# Patient Record
Sex: Female | Born: 1945 | Race: White | Hispanic: No | Smoking: Former smoker
Health system: Southern US, Community
[De-identification: ages and names within clinical notes are randomized; demographics above are authoritative.]

## PROBLEM LIST (undated history)

## (undated) DIAGNOSIS — T7840XA Allergy, unspecified, initial encounter: Secondary | ICD-10-CM

## (undated) DIAGNOSIS — IMO0001 Reserved for inherently not codable concepts without codable children: Secondary | ICD-10-CM

## (undated) DIAGNOSIS — G44009 Cluster headache syndrome, unspecified, not intractable: Secondary | ICD-10-CM

## (undated) DIAGNOSIS — K5792 Diverticulitis of intestine, part unspecified, without perforation or abscess without bleeding: Secondary | ICD-10-CM

## (undated) DIAGNOSIS — L738 Other specified follicular disorders: Secondary | ICD-10-CM

## (undated) DIAGNOSIS — G2581 Restless legs syndrome: Secondary | ICD-10-CM

## (undated) DIAGNOSIS — G4730 Sleep apnea, unspecified: Secondary | ICD-10-CM

## (undated) DIAGNOSIS — K5909 Other constipation: Secondary | ICD-10-CM

## (undated) DIAGNOSIS — F419 Anxiety disorder, unspecified: Secondary | ICD-10-CM

## (undated) DIAGNOSIS — M2550 Pain in unspecified joint: Secondary | ICD-10-CM

## (undated) DIAGNOSIS — G20 Parkinson's disease: Secondary | ICD-10-CM

## (undated) DIAGNOSIS — E7800 Pure hypercholesterolemia, unspecified: Secondary | ICD-10-CM

## (undated) DIAGNOSIS — G4700 Insomnia, unspecified: Secondary | ICD-10-CM

## (undated) DIAGNOSIS — L678 Other hair color and hair shaft abnormalities: Secondary | ICD-10-CM

## (undated) DIAGNOSIS — H269 Unspecified cataract: Secondary | ICD-10-CM

## (undated) DIAGNOSIS — F329 Major depressive disorder, single episode, unspecified: Secondary | ICD-10-CM

## (undated) DIAGNOSIS — M354 Diffuse (eosinophilic) fasciitis: Secondary | ICD-10-CM

## (undated) DIAGNOSIS — R32 Unspecified urinary incontinence: Secondary | ICD-10-CM

## (undated) DIAGNOSIS — D126 Benign neoplasm of colon, unspecified: Secondary | ICD-10-CM

## (undated) DIAGNOSIS — K5790 Diverticulosis of intestine, part unspecified, without perforation or abscess without bleeding: Secondary | ICD-10-CM

## (undated) DIAGNOSIS — I10 Essential (primary) hypertension: Secondary | ICD-10-CM

## (undated) DIAGNOSIS — M2420 Disorder of ligament, unspecified site: Secondary | ICD-10-CM

## (undated) DIAGNOSIS — M629 Disorder of muscle, unspecified: Secondary | ICD-10-CM

## (undated) DIAGNOSIS — K644 Residual hemorrhoidal skin tags: Secondary | ICD-10-CM

## (undated) DIAGNOSIS — M797 Fibromyalgia: Secondary | ICD-10-CM

## (undated) DIAGNOSIS — M1990 Unspecified osteoarthritis, unspecified site: Secondary | ICD-10-CM

## (undated) DIAGNOSIS — G20A1 Parkinson's disease without dyskinesia, without mention of fluctuations: Secondary | ICD-10-CM

## (undated) HISTORY — PX: CATARACT EXTRACTION, BILATERAL: SHX1313

## (undated) HISTORY — DX: Diffuse (eosinophilic) fasciitis: M35.4

## (undated) HISTORY — DX: Restless legs syndrome: G25.81

## (undated) HISTORY — PX: BREAST SURGERY: SHX581

## (undated) HISTORY — DX: Disorder of muscle, unspecified: M62.9

## (undated) HISTORY — DX: Allergy, unspecified, initial encounter: T78.40XA

## (undated) HISTORY — DX: Other specified follicular disorders: L73.8

## (undated) HISTORY — DX: Anxiety disorder, unspecified: F41.9

## (undated) HISTORY — PX: TONSILLECTOMY: SUR1361

## (undated) HISTORY — PX: WISDOM TOOTH EXTRACTION: SHX21

## (undated) HISTORY — DX: Benign neoplasm of colon, unspecified: D12.6

## (undated) HISTORY — DX: Parkinson's disease: G20

## (undated) HISTORY — DX: Unspecified osteoarthritis, unspecified site: M19.90

## (undated) HISTORY — PX: REDUCTION MAMMAPLASTY: SUR839

## (undated) HISTORY — DX: Unspecified urinary incontinence: R32

## (undated) HISTORY — DX: Pure hypercholesterolemia, unspecified: E78.00

## (undated) HISTORY — DX: Major depressive disorder, single episode, unspecified: F32.9

## (undated) HISTORY — DX: Diverticulosis of intestine, part unspecified, without perforation or abscess without bleeding: K57.90

## (undated) HISTORY — PX: ABDOMINAL HYSTERECTOMY: SHX81

## (undated) HISTORY — DX: Pain in unspecified joint: M25.50

## (undated) HISTORY — DX: Parkinson's disease without dyskinesia, without mention of fluctuations: G20.A1

## (undated) HISTORY — DX: Disorder of ligament, unspecified site: M24.20

## (undated) HISTORY — DX: Fibromyalgia: M79.7

## (undated) HISTORY — DX: Residual hemorrhoidal skin tags: K64.4

## (undated) HISTORY — DX: Reserved for inherently not codable concepts without codable children: IMO0001

## (undated) HISTORY — DX: Essential (primary) hypertension: I10

## (undated) HISTORY — DX: Diverticulitis of intestine, part unspecified, without perforation or abscess without bleeding: K57.92

## (undated) HISTORY — DX: Sleep apnea, unspecified: G47.30

## (undated) HISTORY — DX: Other hair color and hair shaft abnormalities: L67.8

## (undated) HISTORY — PX: REFRACTIVE SURGERY: SHX103

## (undated) HISTORY — DX: Cluster headache syndrome, unspecified, not intractable: G44.009

## (undated) HISTORY — DX: Insomnia, unspecified: G47.00

## (undated) HISTORY — DX: Other constipation: K59.09

## (undated) HISTORY — DX: Unspecified cataract: H26.9

---

## 1979-10-14 HISTORY — PX: BREAST SURGERY: SHX581

## 1988-10-13 DIAGNOSIS — D126 Benign neoplasm of colon, unspecified: Secondary | ICD-10-CM

## 1988-10-13 HISTORY — DX: Benign neoplasm of colon, unspecified: D12.6

## 1993-10-13 HISTORY — PX: BREAST REDUCTION SURGERY: SHX8

## 1997-10-13 HISTORY — PX: TOTAL ABDOMINAL HYSTERECTOMY: SHX209

## 1998-08-09 ENCOUNTER — Other Ambulatory Visit: Admission: RE | Admit: 1998-08-09 | Discharge: 1998-08-09 | Payer: Self-pay | Admitting: Internal Medicine

## 1998-08-27 ENCOUNTER — Inpatient Hospital Stay (HOSPITAL_COMMUNITY): Admission: RE | Admit: 1998-08-27 | Discharge: 1998-08-29 | Payer: Self-pay | Admitting: Obstetrics and Gynecology

## 1998-12-13 ENCOUNTER — Encounter: Admission: RE | Admit: 1998-12-13 | Discharge: 1999-03-13 | Payer: Self-pay | Admitting: *Deleted

## 1999-07-29 ENCOUNTER — Encounter: Admission: RE | Admit: 1999-07-29 | Discharge: 1999-10-27 | Payer: Self-pay | Admitting: *Deleted

## 1999-08-22 ENCOUNTER — Other Ambulatory Visit: Admission: RE | Admit: 1999-08-22 | Discharge: 1999-08-22 | Payer: Self-pay | Admitting: Obstetrics and Gynecology

## 2000-03-12 ENCOUNTER — Ambulatory Visit: Admission: RE | Admit: 2000-03-12 | Discharge: 2000-03-12 | Payer: Self-pay | Admitting: Otolaryngology

## 2000-04-28 ENCOUNTER — Encounter: Payer: Self-pay | Admitting: Obstetrics and Gynecology

## 2000-04-28 ENCOUNTER — Encounter: Admission: RE | Admit: 2000-04-28 | Discharge: 2000-04-28 | Payer: Self-pay | Admitting: Obstetrics and Gynecology

## 2000-05-08 ENCOUNTER — Ambulatory Visit (HOSPITAL_BASED_OUTPATIENT_CLINIC_OR_DEPARTMENT_OTHER): Admission: RE | Admit: 2000-05-08 | Discharge: 2000-05-08 | Payer: Self-pay | Admitting: Otolaryngology

## 2000-10-08 ENCOUNTER — Encounter: Payer: Self-pay | Admitting: Family Medicine

## 2000-10-08 ENCOUNTER — Encounter: Admission: RE | Admit: 2000-10-08 | Discharge: 2000-10-08 | Payer: Self-pay | Admitting: Family Medicine

## 2000-10-30 ENCOUNTER — Other Ambulatory Visit: Admission: RE | Admit: 2000-10-30 | Discharge: 2000-10-30 | Payer: Self-pay | Admitting: Obstetrics and Gynecology

## 2001-12-10 ENCOUNTER — Ambulatory Visit (HOSPITAL_COMMUNITY): Admission: RE | Admit: 2001-12-10 | Discharge: 2001-12-10 | Payer: Self-pay | Admitting: Gastroenterology

## 2001-12-23 ENCOUNTER — Other Ambulatory Visit: Admission: RE | Admit: 2001-12-23 | Discharge: 2001-12-23 | Payer: Self-pay | Admitting: Obstetrics and Gynecology

## 2001-12-29 ENCOUNTER — Encounter: Payer: Self-pay | Admitting: Family Medicine

## 2001-12-29 ENCOUNTER — Encounter: Admission: RE | Admit: 2001-12-29 | Discharge: 2001-12-29 | Payer: Self-pay | Admitting: Family Medicine

## 2002-05-13 ENCOUNTER — Ambulatory Visit: Admission: RE | Admit: 2002-05-13 | Discharge: 2002-05-13 | Payer: Self-pay | Admitting: Family Medicine

## 2003-01-03 ENCOUNTER — Encounter: Admission: RE | Admit: 2003-01-03 | Discharge: 2003-01-03 | Payer: Self-pay | Admitting: Family Medicine

## 2003-01-03 ENCOUNTER — Encounter: Payer: Self-pay | Admitting: Family Medicine

## 2003-02-21 ENCOUNTER — Other Ambulatory Visit: Admission: RE | Admit: 2003-02-21 | Discharge: 2003-02-21 | Payer: Self-pay | Admitting: Obstetrics and Gynecology

## 2004-01-08 ENCOUNTER — Encounter: Admission: RE | Admit: 2004-01-08 | Discharge: 2004-01-08 | Payer: Self-pay | Admitting: Obstetrics and Gynecology

## 2004-02-22 ENCOUNTER — Other Ambulatory Visit: Admission: RE | Admit: 2004-02-22 | Discharge: 2004-02-22 | Payer: Self-pay | Admitting: Obstetrics and Gynecology

## 2004-04-30 ENCOUNTER — Encounter: Admission: RE | Admit: 2004-04-30 | Discharge: 2004-05-28 | Payer: Self-pay | Admitting: Family Medicine

## 2004-10-13 HISTORY — PX: ABDOMINOPLASTY: SUR9

## 2005-02-13 ENCOUNTER — Encounter: Admission: RE | Admit: 2005-02-13 | Discharge: 2005-02-13 | Payer: Self-pay | Admitting: Obstetrics and Gynecology

## 2005-02-24 ENCOUNTER — Other Ambulatory Visit: Admission: RE | Admit: 2005-02-24 | Discharge: 2005-02-24 | Payer: Self-pay | Admitting: Addiction Medicine

## 2006-02-20 ENCOUNTER — Encounter: Admission: RE | Admit: 2006-02-20 | Discharge: 2006-02-20 | Payer: Self-pay | Admitting: Obstetrics and Gynecology

## 2006-03-02 ENCOUNTER — Other Ambulatory Visit: Admission: RE | Admit: 2006-03-02 | Discharge: 2006-03-02 | Payer: Self-pay | Admitting: Obstetrics and Gynecology

## 2007-03-30 ENCOUNTER — Encounter: Admission: RE | Admit: 2007-03-30 | Discharge: 2007-03-30 | Payer: Self-pay | Admitting: Obstetrics and Gynecology

## 2007-04-09 ENCOUNTER — Other Ambulatory Visit: Admission: RE | Admit: 2007-04-09 | Discharge: 2007-04-09 | Payer: Self-pay | Admitting: Obstetrics and Gynecology

## 2007-10-14 HISTORY — PX: MUSCLE BIOPSY: SHX716

## 2008-04-12 ENCOUNTER — Encounter: Admission: RE | Admit: 2008-04-12 | Discharge: 2008-04-12 | Payer: Self-pay | Admitting: Obstetrics and Gynecology

## 2008-04-13 ENCOUNTER — Encounter: Admission: RE | Admit: 2008-04-13 | Discharge: 2008-04-13 | Payer: Self-pay | Admitting: Rheumatology

## 2008-04-19 ENCOUNTER — Encounter (INDEPENDENT_AMBULATORY_CARE_PROVIDER_SITE_OTHER): Payer: Self-pay | Admitting: Surgery

## 2008-04-19 ENCOUNTER — Ambulatory Visit (HOSPITAL_COMMUNITY): Admission: RE | Admit: 2008-04-19 | Discharge: 2008-04-19 | Payer: Self-pay | Admitting: Surgery

## 2008-05-08 ENCOUNTER — Encounter: Admission: RE | Admit: 2008-05-08 | Discharge: 2008-05-08 | Payer: Self-pay | Admitting: Surgery

## 2008-08-16 ENCOUNTER — Ambulatory Visit: Payer: Self-pay | Admitting: Obstetrics and Gynecology

## 2008-08-16 ENCOUNTER — Other Ambulatory Visit: Admission: RE | Admit: 2008-08-16 | Discharge: 2008-08-16 | Payer: Self-pay | Admitting: Obstetrics and Gynecology

## 2008-08-16 ENCOUNTER — Encounter: Payer: Self-pay | Admitting: Obstetrics and Gynecology

## 2008-09-25 ENCOUNTER — Ambulatory Visit: Payer: Self-pay | Admitting: Obstetrics and Gynecology

## 2009-04-27 ENCOUNTER — Encounter: Admission: RE | Admit: 2009-04-27 | Discharge: 2009-04-27 | Payer: Self-pay | Admitting: Obstetrics and Gynecology

## 2009-08-29 ENCOUNTER — Encounter (INDEPENDENT_AMBULATORY_CARE_PROVIDER_SITE_OTHER): Payer: Self-pay | Admitting: *Deleted

## 2011-01-13 ENCOUNTER — Other Ambulatory Visit: Payer: Self-pay | Admitting: Obstetrics and Gynecology

## 2011-01-13 ENCOUNTER — Encounter (INDEPENDENT_AMBULATORY_CARE_PROVIDER_SITE_OTHER): Payer: Medicare PPO | Admitting: Obstetrics and Gynecology

## 2011-01-13 ENCOUNTER — Other Ambulatory Visit (HOSPITAL_COMMUNITY)
Admission: RE | Admit: 2011-01-13 | Discharge: 2011-01-13 | Disposition: A | Discharge status: Home / Self Care | Payer: Medicare PPO | Source: Ambulatory Visit | Attending: Obstetrics and Gynecology | Admitting: Obstetrics and Gynecology

## 2011-01-13 DIAGNOSIS — N951 Menopausal and female climacteric states: Secondary | ICD-10-CM

## 2011-01-13 DIAGNOSIS — N952 Postmenopausal atrophic vaginitis: Secondary | ICD-10-CM

## 2011-01-13 DIAGNOSIS — N949 Unspecified condition associated with female genital organs and menstrual cycle: Secondary | ICD-10-CM

## 2011-01-13 DIAGNOSIS — Z124 Encounter for screening for malignant neoplasm of cervix: Secondary | ICD-10-CM | POA: Insufficient documentation

## 2011-01-14 ENCOUNTER — Encounter (HOSPITAL_COMMUNITY): Payer: Self-pay

## 2011-01-14 ENCOUNTER — Ambulatory Visit (HOSPITAL_COMMUNITY)
Admission: RE | Admit: 2011-01-14 | Discharge: 2011-01-14 | Disposition: A | Discharge status: Home / Self Care | Payer: Medicare PPO | Source: Ambulatory Visit | Attending: Obstetrics and Gynecology | Admitting: Obstetrics and Gynecology

## 2011-01-14 DIAGNOSIS — M2420 Disorder of ligament, unspecified site: Secondary | ICD-10-CM | POA: Insufficient documentation

## 2011-01-14 DIAGNOSIS — K5730 Diverticulosis of large intestine without perforation or abscess without bleeding: Secondary | ICD-10-CM | POA: Insufficient documentation

## 2011-01-14 DIAGNOSIS — R109 Unspecified abdominal pain: Secondary | ICD-10-CM | POA: Insufficient documentation

## 2011-01-14 DIAGNOSIS — M629 Disorder of muscle, unspecified: Secondary | ICD-10-CM | POA: Insufficient documentation

## 2011-01-14 DIAGNOSIS — K5900 Constipation, unspecified: Secondary | ICD-10-CM | POA: Insufficient documentation

## 2011-01-14 MED ORDER — IOHEXOL 300 MG/ML  SOLN
100.0000 mL | Freq: Once | INTRAMUSCULAR | Status: AC | PRN
  Administered 2011-01-14: 100 mL via INTRAVENOUS

## 2011-02-11 ENCOUNTER — Encounter: Payer: Self-pay | Admitting: Internal Medicine

## 2011-02-13 ENCOUNTER — Encounter: Payer: Self-pay | Admitting: Internal Medicine

## 2011-02-14 ENCOUNTER — Encounter: Payer: Self-pay | Admitting: Internal Medicine

## 2011-02-17 ENCOUNTER — Ambulatory Visit (INDEPENDENT_AMBULATORY_CARE_PROVIDER_SITE_OTHER): Payer: Medicare PPO | Admitting: Internal Medicine

## 2011-02-17 ENCOUNTER — Encounter: Payer: Self-pay | Admitting: Internal Medicine

## 2011-02-17 VITALS — BP 120/72 | HR 60 | Ht 60.0 in | Wt 139.2 lb

## 2011-02-17 DIAGNOSIS — K5732 Diverticulitis of large intestine without perforation or abscess without bleeding: Secondary | ICD-10-CM

## 2011-02-17 DIAGNOSIS — R933 Abnormal findings on diagnostic imaging of other parts of digestive tract: Secondary | ICD-10-CM

## 2011-02-17 NOTE — Patient Instructions (Addendum)
You will be due for a recall colonoscopy in December 2012 CC:Dr Gottsegan, Dr Catha Gosselin

## 2011-02-17 NOTE — Progress Notes (Signed)
Shannon Sutton Jul 15, 1946 MRN [id number]      History of Present Illness:  This is a 65 year old white female with a recent episode of diverticulitis treated by Dr Oletha Blend with Flagyl 500 mg 3 times a day and Cipro 500 mg twice a day x 1 week. The diagnosis was based on the patient's symptoms and CT scan of the abdomen which showed thickening of the sigmoid colon and descending colon consistent with pericolic inflammation. There was a 2x2.4 cm collection in the sigmoid colon consistent with a small abcess. She responded to oral antibiotics and has been asymptomatic now for 3 weeks. There was no fever, diarrhea or bleeding. 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 She has had numerous colonoscopies starting in the 1990s because of her family history of colorectal neoplasia in her father. She has a personal history of adenomatous polyps in 64 and 1991. Her last colonoscopy in December 2007 did not show any evidence of polyps. She has  chronic constipation for which she takes Correctol.   Past Medical History  Diagnosis Date  . Diverticulosis   . Diverticulitis   . Adenomatous colon polyp 1990  . External hemorrhoids   . Myalgia and myositis, unspecified   . Other disorder of muscle, ligament, and fascia   . Pain in joint, multiple sites    Past Surgical History  Procedure Date  . Breast reduction surgery 1995  . Total abdominal hysterectomy 1999  . Abdominoplasty 2006    "tummy tuck"    reports that she has quit smoking. She has never used smokeless tobacco. She reports that she does not drink alcohol or use illicit drugs. family history includes Breast cancer in her paternal grandmother; Colon cancer in her father; Colon polyps in her mother; and Heart disease in her father and mother. Allergies  Allergen Reactions  . Morphine And Related Itching    agitation        Review of Systems:  The remainder of the 10  point ROS is  negative except as outlined in H&P   Physical Exam: General appearance  Well developed, in no distress. Eyes- non icteric. HEENT nontraumatic, normocephalic. Mouth no lesions, tongue papillated, no cheilosis. Neck supple without adenopathy, thyroid not enlarged, no carotid bruits, no JVD. Lungs Clear to auscultation bilaterally. Cor normal S1 normal S2, regular rhythm , no murmur,  quiet precordium. Abdomen soft relaxed abdomen with a horizontal scar from prior abdominoplasty. Normal active bowel sounds. No distention. No palpable mass. Rectal: Soft Hemoccult negative stool. External hemorrhoidal tags. No fissure. Extremities no pedal edema. Skin no lesions. Neurological alert and oriented x 3. Psychological normal mood and affect.  Assessment and Plan:   Problem #1 history of diverticulosis with first episode of diverticulitis documented on a CT scan of the abdomen. This resolved on a one-week course of broad-spectrum antibiotics. Her abdominal exam today is normal. She will be due for a repeat colonoscopy in December of this year. There is no evidence of occult GI blood loss. She needs to increase the intake of fiber and purchase fiber supplements to take 2 teaspoons daily.   Problem #2 history of anal fissure. This is currently not active. She has external hemorrhoidal tags for which she uses topical ointment. We have discussed again a high fiber diet. She will be due for a recall colonoscopy in December 2012.  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 Patient [place] in [place]. She has possible histoplasmosis.  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 She will call Dr. Fredirick Maudlin office to obtain PPD skin test. Her liver function tests have been done periodically for monitoring of methotrexate therapy. She has had normal liver function tests all along.  02/17/2011 Lina Sar

## 2011-02-20 ENCOUNTER — Other Ambulatory Visit: Payer: Medicare PPO | Admitting: Internal Medicine

## 2011-02-25 NOTE — Op Note (Signed)
NAMEMAISLEY, HAINSWORTH                ACCOUNT NO.:  [id number]   MEDICAL RECORD NO.:  [id number]          PATIENT TYPE:  AMB   LOCATION:  DAY                          FACILITY:  [facility]   PHYSICIAN:  Thomas A. Cornett, M.D.DATE OF BIRTH:  [birth year]   DATE OF PROCEDURE:  04/19/2008  DATE OF DISCHARGE:                               OPERATIVE REPORT   PREOPERATIVE DIAGNOSIS:  Muscle pain, possible eosinophilic fasciitis.   POSTOPERATIVE DIAGNOSIS:  Muscle pain, possible eosinophilic fasciitis.   PROCEDURE:  Open biopsy of left thigh skin, subcutaneous fat, fascia and  muscle involving the vastus lateralis muscle.   SURGEON:  Maisie Fus A. Cornett, MD.   ANESTHESIA:  LMA with 0.25% Sensorcaine local.   ESTIMATED BLOOD LOSS:  5 mL.   TISSUE:  Skin, subcu fat, fascia and muscle consisting of the vastus  lateralis group to pathology.   INDICATIONS FOR PROCEDURE:  The patient is a 65 year old female with  muscle pain.  She has been followed by Dr. Corliss Skains and being worked up  for eosinophilic fasciitis.  She is in need of a biopsy to verify this  diagnosis.   DESCRIPTION OF PROCEDURE:  The patient was brought to the operating room  and placed supine.  Left thigh was prepped and draped anteriorly.  An  area of the mid thigh was chosen and about 3-4-cm incision was made.  I  dissected straight down to the vastus lateralis muscle group.  I took  the skin, fat and fascia overlying this muscle group and some of the  muscle fibers from it.  Total area of excision was about 3 cm.  I then  closed the fascia back with 2-0 Vicryl.  Monocryl 4-0 was used to close  the skin.  Dermabond was applied.  All final counts of sponge, needle  and instruments were found be correct for this portion of the case.  The  patient was awoken, taken to recovery in satisfactory condition.      Thomas A. Cornett, M.D.  Electronically Signed     TAC/MEDQ  D:  04/19/2008  T:  04/19/2008  Job:  [id number]   cc:    Pollyann Savoy, M.D.  Fax: [phone]

## 2011-03-03 ENCOUNTER — Encounter (INDEPENDENT_AMBULATORY_CARE_PROVIDER_SITE_OTHER): Payer: Self-pay | Admitting: Surgery

## 2011-05-22 ENCOUNTER — Ambulatory Visit (INDEPENDENT_AMBULATORY_CARE_PROVIDER_SITE_OTHER): Payer: Self-pay | Admitting: Surgery

## 2011-11-11 ENCOUNTER — Encounter: Payer: Self-pay | Admitting: Internal Medicine

## 2011-12-01 ENCOUNTER — Encounter: Payer: Self-pay | Admitting: Internal Medicine

## 2011-12-19 ENCOUNTER — Ambulatory Visit (AMBULATORY_SURGERY_CENTER): Payer: Medicare Other | Admitting: *Deleted

## 2011-12-19 ENCOUNTER — Encounter: Payer: Self-pay | Admitting: Internal Medicine

## 2011-12-19 VITALS — Ht 61.0 in | Wt 139.8 lb

## 2011-12-19 DIAGNOSIS — Z1211 Encounter for screening for malignant neoplasm of colon: Secondary | ICD-10-CM

## 2011-12-19 MED ORDER — PEG-KCL-NACL-NASULF-NA ASC-C 100 G PO SOLR: ORAL | Status: DC

## 2011-12-29 ENCOUNTER — Telehealth: Payer: Self-pay | Admitting: Internal Medicine

## 2011-12-30 NOTE — Telephone Encounter (Signed)
Please follow the rules for late cancellation.

## 2011-12-31 ENCOUNTER — Encounter: Payer: Medicare PPO | Admitting: Internal Medicine

## 2012-01-14 ENCOUNTER — Ambulatory Visit (AMBULATORY_SURGERY_CENTER): Payer: Medicare Other | Admitting: Internal Medicine

## 2012-01-14 ENCOUNTER — Telehealth: Payer: Self-pay | Admitting: Internal Medicine

## 2012-01-14 ENCOUNTER — Encounter: Payer: Self-pay | Admitting: Internal Medicine

## 2012-01-14 VITALS — BP 134/73 | HR 64 | Temp 95.8°F | Resp 15 | Ht 61.0 in | Wt 139.0 lb

## 2012-01-14 DIAGNOSIS — Z1211 Encounter for screening for malignant neoplasm of colon: Secondary | ICD-10-CM

## 2012-01-14 DIAGNOSIS — D126 Benign neoplasm of colon, unspecified: Secondary | ICD-10-CM

## 2012-01-14 DIAGNOSIS — Z800 Family history of malignant neoplasm of digestive organs: Secondary | ICD-10-CM

## 2012-01-14 MED ORDER — SODIUM CHLORIDE 0.9 % IV SOLN: 500.0000 mL | INTRAVENOUS | Status: DC

## 2012-01-14 NOTE — Op Note (Signed)
 [facility] [address] [place], [place]  [address]  COLONOSCOPY PROCEDURE REPORT  PATIENT:  Shannon, Sutton  MR#:  [id number] BIRTHDATE:  1945/11/11, 65 yrs. old  GENDER:  female ENDOSCOPIST:  Hedwig Morton. Juanda Chance, MD REF. BY:  Catha Gosselin, M.D. PROCEDURE DATE:  01/14/2012 PROCEDURE:  Colonoscopy with biopsy ASA CLASS:  Class I INDICATIONS:  family history of colon cancer, history of pre-cancerous (adenomatous) colon polyps father with colon cancer  aden. polyp 1994, last colon 1999 MEDICATIONS:   MAC sedation, administered by CRNA, propofol (Diprivan) 150 mg  DESCRIPTION OF PROCEDURE:   After the risks and benefits and of the procedure were explained, informed consent was obtained. Digital rectal exam was performed and revealed no rectal masses. The LB CF-[id number] endoscope was introduced through the anus and advanced to the cecum, which was identified by both the appendix and ileocecal valve.  The quality of the prep was good, using MoviPrep.  The instrument was then slowly withdrawn as the colon was fully examined. <<PROCEDUREIMAGES>>  FINDINGS:  A sessile polyp was found. 4 mm sessile polyp in the cecum The polyp was removed using cold biopsy forceps (see image2).  Mild diverticulosis was found in the sigmoid colon (see image1).  This was otherwise a normal examination of the colon (see image3, image4, and image5).   Retroflexed views in the rectum revealed no abnormalities.    The scope was then withdrawn from the patient and the procedure completed.  COMPLICATIONS:  None ENDOSCOPIC IMPRESSION: 1) Sessile polyp 2) Mild diverticulosis in the sigmoid colon 3) Otherwise normal examination RECOMMENDATIONS: 1) Await pathology results 2) High fiber diet.  REPEAT EXAM:  In 5 year(s) for.  ______________________________ Hedwig Morton. Juanda Chance, MD  CC:  n. eSIGNED:   Hedwig Morton. Kingslee Dowse at 01/14/2012 10:22 AM  Filbert Schilder, [id number]

## 2012-01-14 NOTE — Progress Notes (Signed)
Patient did not experience any of the following events: a burn prior to discharge; a fall within the facility; wrong site/side/patient/procedure/implant event; or a hospital transfer or hospital admission upon discharge from the facility. (G8907) Patient did not have preoperative order for IV antibiotic SSI prophylaxis. (G8918)  

## 2012-01-14 NOTE — Patient Instructions (Signed)
YOU HAD AN ENDOSCOPIC PROCEDURE TODAY AT THE [facility]: Refer to the procedure report that was given to you for any specific questions about what was found during the examination.  If the procedure report does not answer your questions, please call your gastroenterologist to clarify.  If you requested that your care partner not be given the details of your procedure findings, then the procedure report has been included in a sealed envelope for you to review at your convenience later.  YOU SHOULD EXPECT: Some feelings of bloating in the abdomen. Passage of more gas than usual.  Walking can help get rid of the air that was put into your GI tract during the procedure and reduce the bloating. If you had a lower endoscopy (such as a colonoscopy or flexible sigmoidoscopy) you may notice spotting of blood in your stool or on the toilet paper. If you underwent a bowel prep for your procedure, then you may not have a normal bowel movement for a few days.  DIET: Your first meal following the procedure should be a light meal and then it is ok to progress to your normal diet.  A half-sandwich or bowl of soup is an example of a good first meal.  Heavy or fried foods are harder to digest and may make you feel nauseous or bloated.  Likewise meals heavy in dairy and vegetables can cause extra gas to form and this can also increase the bloating.  Drink plenty of fluids but you should avoid alcoholic beverages for 24 hours.  ACTIVITY: Your care partner should take you home directly after the procedure.  You should plan to take it easy, moving slowly for the rest of the day.  You can resume normal activity the day after the procedure however you should NOT DRIVE or use heavy machinery for 24 hours (because of the sedation medicines used during the test).    SYMPTOMS TO REPORT IMMEDIATELY: A gastroenterologist can be reached at any hour.  During normal business hours, 8:30 AM to 5:00 PM Monday through Friday,  call [phone].  After hours and on weekends, please call the GI answering service at [phone] who will take a message and have the physician on call contact you.   Following lower endoscopy (colonoscopy or flexible sigmoidoscopy):  Excessive amounts of blood in the stool  Significant tenderness or worsening of abdominal pains  Swelling of the abdomen that is new, acute  Fever of 100F or higher    FOLLOW UP: If any biopsies were taken you will be contacted by phone or by letter within the next 1-3 weeks.  Call your gastroenterologist if you have not heard about the biopsies in 3 weeks.  Our staff will call the home number listed on your records the next business day following your procedure to check on you and address any questions or concerns that you may have at that time regarding the information given to you following your procedure. This is a courtesy call and so if there is no answer at the home number and we have not heard from you through the emergency physician on call, we will assume that you have returned to your regular daily activities without incident.  SIGNATURES/CONFIDENTIALITY: You and/or your care partner have signed paperwork which will be entered into your electronic medical record.  These signatures attest to the fact that that the information above on your After Visit Summary has been reviewed and is understood.  Full responsibility of the confidentiality   of this discharge information lies with you and/or your care-partner.     

## 2012-01-14 NOTE — Telephone Encounter (Signed)
Please cancel late cancellation fee. I have spoken to the daughter

## 2012-01-14 NOTE — Progress Notes (Signed)
The pt tolerated the colonoscopy very well. Maw   

## 2012-01-15 ENCOUNTER — Telehealth: Payer: Self-pay | Admitting: *Deleted

## 2012-01-15 NOTE — Telephone Encounter (Signed)
Left message on number given in admitting as directed. ewm

## 2012-01-15 NOTE — Telephone Encounter (Signed)
Please see Dr Regino Schultze notes

## 2012-01-19 ENCOUNTER — Encounter: Payer: Self-pay | Admitting: Internal Medicine

## 2012-05-07 LAB — HM DEXA SCAN

## 2012-11-12 ENCOUNTER — Encounter: Payer: Self-pay | Admitting: Physical Medicine & Rehabilitation

## 2012-11-16 ENCOUNTER — Encounter: Payer: Medicare Other | Attending: Physical Medicine & Rehabilitation | Admitting: Physical Medicine & Rehabilitation

## 2012-11-16 ENCOUNTER — Encounter: Payer: Self-pay | Admitting: Physical Medicine & Rehabilitation

## 2012-11-16 VITALS — BP 105/56 | HR 79 | Resp 14 | Ht 61.0 in | Wt 135.0 lb

## 2012-11-16 DIAGNOSIS — G4700 Insomnia, unspecified: Secondary | ICD-10-CM

## 2012-11-16 DIAGNOSIS — F3289 Other specified depressive episodes: Secondary | ICD-10-CM

## 2012-11-16 DIAGNOSIS — M629 Disorder of muscle, unspecified: Secondary | ICD-10-CM

## 2012-11-16 DIAGNOSIS — M797 Fibromyalgia: Secondary | ICD-10-CM

## 2012-11-16 DIAGNOSIS — M354 Diffuse (eosinophilic) fasciitis: Secondary | ICD-10-CM

## 2012-11-16 DIAGNOSIS — M2420 Disorder of ligament, unspecified site: Secondary | ICD-10-CM | POA: Insufficient documentation

## 2012-11-16 DIAGNOSIS — Z5181 Encounter for therapeutic drug level monitoring: Secondary | ICD-10-CM

## 2012-11-16 DIAGNOSIS — IMO0001 Reserved for inherently not codable concepts without codable children: Secondary | ICD-10-CM

## 2012-11-16 DIAGNOSIS — F329 Major depressive disorder, single episode, unspecified: Secondary | ICD-10-CM

## 2012-11-16 DIAGNOSIS — F32A Depression, unspecified: Secondary | ICD-10-CM

## 2012-11-16 DIAGNOSIS — D7218 Eosinophilia in diseases classified elsewhere: Secondary | ICD-10-CM

## 2012-11-16 DIAGNOSIS — G2581 Restless legs syndrome: Secondary | ICD-10-CM | POA: Insufficient documentation

## 2012-11-16 DIAGNOSIS — R52 Pain, unspecified: Secondary | ICD-10-CM

## 2012-11-16 HISTORY — DX: Depression, unspecified: F32.A

## 2012-11-16 HISTORY — DX: Diffuse (eosinophilic) fasciitis: M35.4

## 2012-11-16 HISTORY — DX: Fibromyalgia: M79.7

## 2012-11-16 HISTORY — DX: Insomnia, unspecified: G47.00

## 2012-11-16 HISTORY — DX: Eosinophilia in diseases classified elsewhere: D72.18

## 2012-11-16 NOTE — Patient Instructions (Signed)
SUPPLEMENTS TO TRY:  DHEA: 25MG  DAILY FOR PAIN AND ENERGY MELATONIN 3-10MG  AT BEDTIME FOR SLEEP

## 2012-11-16 NOTE — Progress Notes (Signed)
Subjective:    Patient ID: Shannon Sutton, female    DOB: [birth year], 67 y.o.   MRN: [id number]  HPI This is a pleasant 67 yo female who tells me she was dx'ed with FMS about 10-15 years ago. She had problems with generalized pain, with particularly around the left shoulder. She received TPI's by Dr. Link Snuffer around 2008 which were quite helpful.  Just after that time she was dx'ed with eosinophilic fasciitis which has been treated and is now basically in remission. This was treated ad [facility] and eventually Dr. Nickola Major has followed it locally.   From a pain standpoint, her left shoulder pain is constant. She tries stretching, yoga, rolling around on a tennis ball, but none of what she does seems to help. She had a recent deep tissue massage which helped for a short period of time. She reports a remote course of physical therapy at [facility] and had variable results with myofascial release, biofeedback, etc.  She also reports RLS, insomnia, depression, mild constipation. She uses the klonopin for her RLS.  She takes klonopin, trazodone, xanax at night to help sleep and RLS symptoms. She also takes co-enzyme q10, multivitamin, omega 3's, vitamin d. She states that her vitamin d level was checked recently.    Pain Inventory Average Pain 9 Pain Right Now 4 My pain is burning and aching  In the last 24 hours, has pain interfered with the following? General activity 8 Relation with others 8 Enjoyment of life 8 What TIME of day is your pain at its worst? morning Sleep (in general) Poor  Pain is worse with: inactivity Pain improves with: therapy/exercise and medication Relief from Meds: 4  Mobility walk without assistance do you drive?  yes Do you have any goals in this area?  yes  Function retired Do you have any goals in this area?  yes  Neuro/Psych bowel control problems tingling spasms dizziness loss of taste or smell  Prior Studies x-rays  Physicians  involved in your care Primary care Dr Catha Gosselin Neurologist Dr Terrace [place] Rheumatologist Dr Hocks   Family History  Problem Relation Age of Onset  . Colon cancer Father 55  . Heart disease Father   . Cancer Father     COLON  . Colon polyps Mother   . Heart disease Mother   . Breast cancer Paternal Grandmother   . COPD Sister   . Stomach cancer Neg Hx    History   Social History  . Marital Status: Divorced    Spouse Name: N/A    Number of Children: 2  . Years of Education: N/A   Occupational History  . Retired    Social History Main Topics  . Smoking status: Former Smoker    Quit date: 12/19/1986  . Smokeless tobacco: Never Used  . Alcohol Use: Yes     Comment: ONCE OR TWICE A YEAR WILL HAVE WINE  . Drug Use: No  . Sexually Active: None   Other Topics Concern  . None   Social History Narrative  . None   Past Surgical History  Procedure Date  . Breast reduction surgery 1995  . Total abdominal hysterectomy 1999  . Abdominoplasty 2006    "tummy tuck"  . Breast surgery 1981    left breast biopsy  . Muscle biopsy [id number]    DEEP TISSUE  . Tonsillectomy   . Wisdom tooth extraction    Past Medical History  Diagnosis Date  . Diverticulosis   . Diverticulitis   .  Adenomatous colon polyp 1990  . External hemorrhoids   . Myalgia and myositis, unspecified   . Other disorder of muscle, ligament, and fascia   . Pain in joint, multiple sites    BP 105/56  Pulse 79  Resp 14  Ht 5\' 1"  (1.549 m)  Wt 135 lb (61.236 kg)  BMI 25.51 kg/m2  SpO2 99%     Review of Systems  Constitutional: Positive for appetite change.  HENT: Positive for neck pain.   Gastrointestinal: Positive for constipation.  Musculoskeletal: Positive for myalgias and arthralgias.  Neurological: Positive for numbness.  All other systems reviewed and are negative.       Objective:   Physical Exam  General: Alert and oriented x 3, No apparent distress HEENT: Head is normocephalic,  atraumatic, PERRLA, EOMI, sclera anicteric, oral mucosa pink and moist, dentition intact, ext ear canals clear,  Neck: Supple without JVD or lymphadenopathy Heart: Reg rate and rhythm. No murmurs rubs or gallops Chest: CTA bilaterally without wheezes, rales, or rhonchi; no distress Abdomen: Soft, non-tender, non-distended, bowel sounds positive. Extremities: No clubbing, cyanosis, or edema. Pulses are 2+ Skin: Clean and intact without signs of breakdown. Skin is quite supple.  Neuro: Pt is cognitively appropriate with normal insight, memory, and awareness. Cranial nerves 2-12 are intact. Sensory exam is normal. Reflexes are 2+ in all 4's. Fine motor coordination is intact. No tremors. Motor function is grossly 5/5.  Musculoskeletal:visible TPI's in right trap, also with TPI in right SCM. A forward head position noted with shoulders internally.  No pain with RTC maneuvers. Pain with right rotation of neck to the right especially with right side bending. She has generalized mild tenderness throughout her limbs and trunk Psych: Pt's affect is appropriate. Pt is cooperative        Assessment & Plan:  1. Fibromyalgia syndrome--with myofascial pain, RLS, insomnia, depression 2. Eosinophilic Fasciitis- in remission 3. Insomnia   Plan:  1. After informed consent and preparation of the skin with isopropyl alcohol, I injected the right trap (2) and right SCM (1) each with 2cc of 1% lidocaine. The patient tolerated well initially. She did develop tingling and numbness in the left arm about 5 minutes after the injections. She had some subjective weakness but functional use of the left arm. I suspect that she had some spread of the anesthetic to her plexus. No signs of hematoma or bruising was seen at the [facility] site, no swelling. I encouraged her that these symptoms would subside once the lidocaine wore off. She was to call if she experienced persistent sensory loss or weakness. She was experiencing  substantial relief in the left trap after the injections. 2. Continue with supplements as she's been taking. Encouraged the use of DHEA, 25mg  qday as well as melatonin 3-10 mg qhs to help with energy and sleep respectively 3. Will make a referral to [facility] for cervical and shoulder girdle posture, ROM, modalities, HEP. 4. Follow up with me in about 6 weeks. 45 minutes of face to face patient care time were spent during this visit. All questions were encouraged and answered.  I would like to thank Dr. Clarene Duke for the referral of this pleasant lady.

## 2012-11-27 ENCOUNTER — Other Ambulatory Visit: Payer: Self-pay

## 2012-12-28 ENCOUNTER — Ambulatory Visit: Payer: Medicare Other | Admitting: Physical Medicine & Rehabilitation

## 2013-05-15 LAB — HM MAMMOGRAPHY: HM Mammogram: NORMAL

## 2013-05-18 ENCOUNTER — Other Ambulatory Visit: Payer: Self-pay

## 2013-05-26 ENCOUNTER — Other Ambulatory Visit: Payer: Self-pay | Admitting: Family Medicine

## 2013-05-26 DIAGNOSIS — H579 Unspecified disorder of eye and adnexa: Secondary | ICD-10-CM

## 2013-06-01 ENCOUNTER — Ambulatory Visit
Admission: RE | Admit: 2013-06-01 | Discharge: 2013-06-01 | Disposition: A | Discharge status: Home / Self Care | Payer: Medicare Other | Source: Ambulatory Visit | Attending: Family Medicine | Admitting: Family Medicine

## 2013-06-01 DIAGNOSIS — H579 Unspecified disorder of eye and adnexa: Secondary | ICD-10-CM

## 2013-06-29 ENCOUNTER — Other Ambulatory Visit: Payer: Self-pay | Admitting: Family Medicine

## 2013-06-29 DIAGNOSIS — IMO0001 Reserved for inherently not codable concepts without codable children: Secondary | ICD-10-CM

## 2013-06-29 DIAGNOSIS — M549 Dorsalgia, unspecified: Secondary | ICD-10-CM

## 2013-07-11 ENCOUNTER — Ambulatory Visit
Admission: RE | Admit: 2013-07-11 | Discharge: 2013-07-11 | Disposition: A | Discharge status: Home / Self Care | Payer: Medicare Other | Source: Ambulatory Visit | Attending: Family Medicine | Admitting: Family Medicine

## 2013-07-11 DIAGNOSIS — M549 Dorsalgia, unspecified: Secondary | ICD-10-CM

## 2013-07-11 DIAGNOSIS — IMO0001 Reserved for inherently not codable concepts without codable children: Secondary | ICD-10-CM

## 2013-08-18 ENCOUNTER — Other Ambulatory Visit: Payer: Self-pay

## 2013-09-19 LAB — HEPATIC FUNCTION PANEL
ALK PHOS: 55 U/L (ref 25–125)
ALT: 15 U/L (ref 7–35)
AST: 18 U/L (ref 13–35)
Bilirubin, Total: 0.5 mg/dL

## 2013-09-19 LAB — BASIC METABOLIC PANEL
BUN: 11 mg/dL (ref 4–21)
Creatinine: 0.6 mg/dL (ref 0.5–1.1)
Glucose: 90 mg/dL
Potassium: 4.3 mmol/L (ref 3.4–5.3)
Sodium: 140 mmol/L (ref 137–147)

## 2013-09-19 LAB — LIPID PANEL
CHOLESTEROL: 263 mg/dL — AB (ref 0–200)
HDL: 69 mg/dL (ref 35–70)
LDL CALC: 169 mg/dL
TRIGLYCERIDES: 127 mg/dL (ref 40–160)

## 2013-09-19 LAB — TSH: TSH: 2.62 u[IU]/mL (ref 0.41–5.90)

## 2013-11-30 ENCOUNTER — Ambulatory Visit (INDEPENDENT_AMBULATORY_CARE_PROVIDER_SITE_OTHER): Payer: 59 | Admitting: Physician Assistant

## 2013-11-30 ENCOUNTER — Encounter: Payer: Self-pay | Admitting: Physician Assistant

## 2013-11-30 VITALS — BP 120/70 | HR 90 | Temp 98.0°F | Wt 131.4 lb

## 2013-11-30 DIAGNOSIS — R3989 Other symptoms and signs involving the genitourinary system: Secondary | ICD-10-CM

## 2013-11-30 DIAGNOSIS — N390 Urinary tract infection, site not specified: Secondary | ICD-10-CM

## 2013-11-30 DIAGNOSIS — R399 Unspecified symptoms and signs involving the genitourinary system: Secondary | ICD-10-CM

## 2013-11-30 LAB — POCT URINALYSIS DIPSTICK
Bilirubin, UA: NEGATIVE
Blood, UA: NEGATIVE
Glucose, UA: NEGATIVE
Ketones, UA: NEGATIVE
Nitrite, UA: NEGATIVE
Protein, UA: NEGATIVE
Spec Grav, UA: <=1.005
Urobilinogen, UA: 0.2
pH, UA: 6

## 2013-11-30 MED ORDER — PHENAZOPYRIDINE HCL 97.2 MG PO TABS: 97.0000 mg | ORAL_TABLET | Freq: Three times a day (TID) | ORAL | Status: DC | PRN

## 2013-11-30 MED ORDER — SULFAMETHOXAZOLE-TMP DS 800-160 MG PO TABS: 1.0000 | ORAL_TABLET | Freq: Two times a day (BID) | ORAL | Status: DC

## 2013-11-30 NOTE — Progress Notes (Signed)
Pre visit review using our clinic review tool, if applicable. No additional management support is needed unless otherwise documented below in the visit note. 

## 2013-11-30 NOTE — Progress Notes (Signed)
Subjective:    Patient ID: Shannon Sutton, female    DOB: 1946-03-22, 68 y.o.   MRN: [id number]  HPI Comments: Patient is a 68 year old female who presents to the office with complaint of lower abdominal pain and chills. Patient reports last evening she experienced pain in the lower abdomen when urinating. Reports she was experiencing chills both last night and this morning, bad enough to make her teeth "clatter". Fever this morning with Tmax of 100*,  Reports lower back pain starting yesterday as well. Has used nothing for pain or fever relief.  Denies change in bowels, hematuria, dysuria, or other complaints at this time.   Past Medical History  Diagnosis Date  . Diverticulosis   . Diverticulitis   . Adenomatous colon polyp 1990  . External hemorrhoids   . Myalgia and myositis, unspecified   . Other disorder of muscle, ligament, and fascia   . Pain in joint, multiple sites    Family History  Problem Relation Age of Onset  . Colon cancer Father 38  . Heart disease Father   . Cancer Father     COLON  . Colon polyps Mother   . Heart disease Mother   . Breast cancer Paternal Grandmother   . COPD Sister   . Stomach cancer Neg Hx    History   Social History  . Marital Status: Divorced    Spouse Name: N/A    Number of Children: 2  . Years of Education: N/A   Occupational History  . Retired    Social History Main Topics  . Smoking status: Former Smoker    Quit date: 12/19/1986  . Smokeless tobacco: Never Used  . Alcohol Use: Yes     Comment: ONCE OR TWICE A YEAR WILL HAVE WINE  . Drug Use: No  . Sexual Activity: None   Other Topics Concern  . None   Social History Narrative  . None   Past Surgical History  Procedure Laterality Date  . Breast reduction surgery  1995  . Total abdominal hysterectomy  1999  . Abdominoplasty  2006    "tummy tuck"  . Breast surgery  1981    left breast biopsy  . Muscle biopsy  [id number]    DEEP TISSUE  . Tonsillectomy    .  Wisdom tooth extraction       Review of Systems  Constitutional: Positive for chills. Negative for activity change and appetite change.  Gastrointestinal: Positive for nausea (last evening, resolved now). Negative for vomiting.  Genitourinary: Negative for dysuria, urgency, hematuria, flank pain, decreased urine volume and difficulty urinating.       Pain in lower abdomen at end of urination.  Musculoskeletal: Positive for back pain.  Skin: Negative for rash.  Neurological: Negative for dizziness and light-headedness.       Objective:   Physical Exam  Vitals reviewed. Constitutional: She is oriented to person, place, and time. She appears well-developed and well-nourished. No distress.  HENT:  Head: Normocephalic and atraumatic.  Right Ear: External ear normal.  Left Ear: External ear normal.  Eyes: Conjunctivae are normal.  Neck: Normal range of motion.  Cardiovascular: Normal rate, regular rhythm and normal heart sounds.  Exam reveals no gallop and no friction rub.   No murmur heard. Pulmonary/Chest: Effort normal and breath sounds normal. She has no wheezes. She has no rales.  Abdominal: Soft. Normal appearance and bowel sounds are normal. There is no hepatosplenomegaly. There is no tenderness. There is  no rebound and no CVA tenderness.  Musculoskeletal: Normal range of motion.  Neurological: She is alert and oriented to person, place, and time.  Skin: Skin is warm and dry.  Psychiatric: She has a normal mood and affect.   Filed Vitals:   11/30/13 1149  BP: 120/70  Pulse: 90  Temp: 98 F (36.7 C)   Results for orders placed in visit on 11/30/13  POCT URINALYSIS DIPSTICK      Result Value Ref Range   Color, UA light yellow     Clarity, UA clear     Glucose, UA neg     Bilirubin, UA neg     Ketones, UA neg     Spec Grav, UA <=1.005     Blood, UA neg     pH, UA 6.0     Protein, UA neg     Urobilinogen, UA 0.2     Nitrite, UA neg     Leukocytes, UA large (3+)           Assessment & Plan:  URI Patient afebrile, non tender abdomen, no CVA tenderness, POC urine with large leukocytes, will treat for UTI Rx for Bactrim 800-160 mg, one tab bid x 3 days and Pyridium. Counseled to drink plenty of water, RTO if no improvement of symptoms.

## 2013-11-30 NOTE — Patient Instructions (Signed)
It was great to meet you today Ms. Slabach!  I have sent prescriptions to your pharmacy, please take as prescribed.    Urinary Tract Infection A urinary tract infection (UTI) can occur any place along the urinary tract. The tract includes the kidneys, ureters, bladder, and urethra. A type of germ called bacteria often causes a UTI. UTIs are often helped with antibiotic medicine.  HOME CARE   If given, take antibiotics as told by your doctor. Finish them even if you start to feel better.  Drink enough fluids to keep your pee (urine) clear or pale yellow.  Avoid tea, drinks with caffeine, and bubbly (carbonated) drinks.  Pee often. Avoid holding your pee in for a long time.  Pee before and after having sex (intercourse).  Wipe from front to back after you poop (bowel movement) if you are a woman. Use each tissue only once. GET HELP RIGHT AWAY IF:   You have back pain.  You have lower belly (abdominal) pain.  You have chills.  You feel sick to your stomach (nauseous).  You throw up (vomit).  Your burning or discomfort with peeing does not go away.  You have a fever.  Your symptoms are not better in 3 days. MAKE SURE YOU:   Understand these instructions.  Will watch your condition.  Will get help right away if you are not doing well or get worse. Document Released: 03/17/2008 Document Revised: 06/23/2012 Document Reviewed: 04/29/2012 [facility] Patient Information 2014 [place], [place].

## 2013-12-01 ENCOUNTER — Other Ambulatory Visit: Payer: 59

## 2013-12-01 DIAGNOSIS — R399 Unspecified symptoms and signs involving the genitourinary system: Secondary | ICD-10-CM

## 2013-12-03 LAB — URINE CULTURE: Colony Count: >=100000

## 2013-12-14 ENCOUNTER — Encounter: Payer: Self-pay | Admitting: Physician Assistant

## 2013-12-14 ENCOUNTER — Other Ambulatory Visit (INDEPENDENT_AMBULATORY_CARE_PROVIDER_SITE_OTHER): Payer: 59

## 2013-12-14 ENCOUNTER — Ambulatory Visit (INDEPENDENT_AMBULATORY_CARE_PROVIDER_SITE_OTHER): Payer: 59 | Admitting: Physician Assistant

## 2013-12-14 VITALS — BP 110/72 | HR 71 | Temp 98.2°F | Ht 61.0 in | Wt 134.8 lb

## 2013-12-14 DIAGNOSIS — G4700 Insomnia, unspecified: Secondary | ICD-10-CM

## 2013-12-14 DIAGNOSIS — Z780 Asymptomatic menopausal state: Secondary | ICD-10-CM

## 2013-12-14 DIAGNOSIS — F329 Major depressive disorder, single episode, unspecified: Secondary | ICD-10-CM

## 2013-12-14 DIAGNOSIS — F3289 Other specified depressive episodes: Secondary | ICD-10-CM

## 2013-12-14 DIAGNOSIS — F32A Depression, unspecified: Secondary | ICD-10-CM

## 2013-12-14 DIAGNOSIS — R3989 Other symptoms and signs involving the genitourinary system: Secondary | ICD-10-CM

## 2013-12-14 DIAGNOSIS — Z0000 Encounter for general adult medical examination without abnormal findings: Secondary | ICD-10-CM

## 2013-12-14 DIAGNOSIS — R399 Unspecified symptoms and signs involving the genitourinary system: Secondary | ICD-10-CM

## 2013-12-14 DIAGNOSIS — Z23 Encounter for immunization: Secondary | ICD-10-CM

## 2013-12-14 DIAGNOSIS — E7800 Pure hypercholesterolemia, unspecified: Secondary | ICD-10-CM

## 2013-12-14 DIAGNOSIS — G2581 Restless legs syndrome: Secondary | ICD-10-CM

## 2013-12-14 DIAGNOSIS — N390 Urinary tract infection, site not specified: Secondary | ICD-10-CM

## 2013-12-14 DIAGNOSIS — F419 Anxiety disorder, unspecified: Secondary | ICD-10-CM

## 2013-12-14 DIAGNOSIS — F411 Generalized anxiety disorder: Secondary | ICD-10-CM

## 2013-12-14 LAB — HEPATIC FUNCTION PANEL
ALBUMIN: 3.6 g/dL (ref 3.5–5.2)
ALK PHOS: 50 U/L (ref 39–117)
ALT: 16 U/L (ref 0–35)
AST: 24 U/L (ref 0–37)
BILIRUBIN TOTAL: 0.9 mg/dL (ref 0.3–1.2)
Bilirubin, Direct: 0.1 mg/dL (ref 0.0–0.3)
Total Protein: 6.6 g/dL (ref 6.0–8.3)

## 2013-12-14 LAB — TSH: TSH: 1.36 u[IU]/mL (ref 0.35–5.50)

## 2013-12-14 LAB — URINALYSIS, ROUTINE W REFLEX MICROSCOPIC
BILIRUBIN URINE: NEGATIVE
Hgb urine dipstick: NEGATIVE
Ketones, ur: NEGATIVE
NITRITE: NEGATIVE
PH: 6 (ref 5.0–8.0)
Specific Gravity, Urine: <=1.005 — AB (ref 1.000–1.030)
TOTAL PROTEIN, URINE-UPE24: NEGATIVE
URINE GLUCOSE: NEGATIVE
Urobilinogen, UA: 0.2 (ref 0.0–1.0)

## 2013-12-14 LAB — POCT URINALYSIS DIPSTICK
BILIRUBIN UA: NEGATIVE
Blood, UA: NEGATIVE
Glucose, UA: NEGATIVE
KETONES UA: NEGATIVE
Nitrite, UA: NEGATIVE
Protein, UA: NEGATIVE
Urobilinogen, UA: 0.2
pH, UA: 5

## 2013-12-14 LAB — CBC WITH DIFFERENTIAL/PLATELET
BASOS PCT: 0.8 % (ref 0.0–3.0)
Basophils Absolute: 0.1 10*3/uL (ref 0.0–0.1)
EOS PCT: 2.9 % (ref 0.0–5.0)
Eosinophils Absolute: 0.2 10*3/uL (ref 0.0–0.7)
HEMATOCRIT: 38 % (ref 36.0–46.0)
HEMOGLOBIN: 12.8 g/dL (ref 12.0–15.0)
LYMPHS ABS: 2.3 10*3/uL (ref 0.7–4.0)
Lymphocytes Relative: 29.3 % (ref 12.0–46.0)
MCHC: 33.6 g/dL (ref 30.0–36.0)
MCV: 91.7 fl (ref 78.0–100.0)
MONOS PCT: 7.3 % (ref 3.0–12.0)
Monocytes Absolute: 0.6 10*3/uL (ref 0.1–1.0)
NEUTROS ABS: 4.7 10*3/uL (ref 1.4–7.7)
Neutrophils Relative %: 59.7 % (ref 43.0–77.0)
PLATELETS: 258 10*3/uL (ref 150.0–400.0)
RBC: 4.15 Mil/uL (ref 3.87–5.11)
RDW: 13.2 % (ref 11.5–14.6)
WBC: 7.9 10*3/uL (ref 4.5–10.5)

## 2013-12-14 LAB — LIPID PANEL
CHOLESTEROL: 187 mg/dL (ref 0–200)
HDL: 67.7 mg/dL (ref >39.00)
LDL Cholesterol: 109 mg/dL — ABNORMAL HIGH (ref 0–99)
Total CHOL/HDL Ratio: 3
Triglycerides: 53 mg/dL (ref 0.0–149.0)
VLDL: 10.6 mg/dL (ref 0.0–40.0)

## 2013-12-14 LAB — BASIC METABOLIC PANEL
BUN: 13 mg/dL (ref 6–23)
CALCIUM: 8.9 mg/dL (ref 8.4–10.5)
CO2: 28 meq/L (ref 19–32)
CREATININE: 0.6 mg/dL (ref 0.4–1.2)
Chloride: 105 mEq/L (ref 96–112)
GFR: 107.13 mL/min (ref >60.00)
GLUCOSE: 70 mg/dL (ref 70–99)
Potassium: 3.8 mEq/L (ref 3.5–5.1)
Sodium: 138 mEq/L (ref 135–145)

## 2013-12-14 MED ORDER — NITROFURANTOIN MONOHYD MACRO 100 MG PO CAPS: 100.0000 mg | ORAL_CAPSULE | Freq: Two times a day (BID) | ORAL | Status: DC

## 2013-12-14 NOTE — Progress Notes (Signed)
Patient ID: Shannon Sutton is a 68 y.o. female DOB: [id number] MRN: [id number]     HPI:  Patient is a 68 year old female who presents to the office to establish care. Patient has had regular scheduled care in past however, insurance has changed and no longer accepted at previous PCP office. States generally in good health. Since retiring, patient has lost 50-60 pounds with exercise and a planned meal diet system. Has had regular GYN visits however provider retired would like referral for new provider. Follow with Dr. Dickie La, GI, for history of colon polyps, to be seen in 2018. Seen here two weeks ago for acute UTI, treated with antibiotic which she finished, complains of continued frequency and pain with urination. 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   Influenza:  Pneumonia: 12/14 Tetanus: more than 10 years ago PAP: hysterectomy LMP: post menopausal Mammogram: 8/14 Eye Dr. Appointment this month for surgical procedure Dentist: sees regularly Colonoscopy: 2013 Dexa scan: unknown  ROS: As stated in HPI. All other systems negative  Past Medical History  Diagnosis Date  . Diverticulosis   . Diverticulitis   . Adenomatous colon polyp 1990  . External hemorrhoids   . Myalgia and myositis, unspecified   . Other disorder of muscle, ligament, and fascia   . Pain in joint, multiple sites   . Depression 11/16/2012  . 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 Fibromyalgia with myofascial pain 11/16/2012  . Insomnia 11/16/2012  .  Arthritis   . Headaches, cluster   . Allergy   . Hyperlipidemia   . Urine incontinence    Family History  Problem Relation Age of Onset  . Colon cancer Father 31  . Heart disease Father   . Cancer Father     COLON  . Colon polyps Mother   . Heart disease Mother   . Breast cancer Paternal Grandmother   . COPD Sister   . Stomach cancer Neg Hx   . Heart disease Other     family history   History   Social History  . Marital Status: Divorced    Spouse Name: N/A    Number of Children: 2  . Years of Education: N/A   Occupational History  . Retired    Social History Main Topics  . Smoking status: Former Smoker    Quit date: 12/19/1986  . Smokeless tobacco: Never Used  . Alcohol Use: Yes     Comment: ONCE OR TWICE A YEAR WILL HAVE WINE  . Drug Use: No  . Sexual Activity: None   Other Topics Concern  . None   Social History Narrative  . None   Past Surgical History  Procedure Laterality Date  . Breast reduction surgery  1995  . Total abdominal hysterectomy  1999  . Abdominoplasty  2006    "tummy tuck"  . Breast surgery  1981    left breast biopsy  . Muscle biopsy  [id number]    DEEP TISSUE  . Tonsillectomy    . Wisdom tooth extraction     Current Outpatient Prescriptions on File Prior to Visit  Medication Sig Dispense Refill  . ALPRAZolam (  XANAX) 0.5 MG tablet Take 0.5 mg by mouth at bedtime as needed.        Marland Kitchen aspirin 81 MG tablet Take 81 mg by mouth daily.      . B Complex-C (SUPER B COMPLEX) TABS Take by mouth.        Marland Kitchen CALCIUM-MAGNESIUM-ZINC PO Take by mouth.        . Cholecalciferol (VITAMIN D3) 2000 UNITS TABS Take 1 tablet by mouth daily.        . clonazePAM (KLONOPIN) 0.5 MG tablet Take 0.5 mg by mouth at bedtime as needed.        Marland Kitchen co-enzyme Q-10 30 MG capsule Take 30 mg by mouth 3 (three) times daily.      . cyclobenzaprine (FLEXERIL) 10 MG tablet Take 10 mg by mouth 3 (three) times daily as needed.       Marland Kitchen FLUoxetine (PROZAC) 40 MG capsule Take 40  mg by mouth daily.       . Multiple Vitamins-Minerals (MULTIVITAMIN WITH MINERALS) tablet Take 1 tablet by mouth daily.        . Omega-3 Fatty Acids (OMEGA-3 FISH OIL PO) Take by mouth.        . pravastatin (PRAVACHOL) 40 MG tablet Take 40 mg by mouth daily.        . traZODone (DESYREL) 50 MG tablet Take 50 mg by mouth at bedtime.         No current facility-administered medications on file prior to visit.   Allergies  Allergen Reactions  . Morphine And Related Itching    agitation    PE: CONSTITUTIONAL: Well developed, well nourished, pleasant, appears stated age, in NAD HEENT: normocephalic, atraumatic, bilateral ext/int canals normal. Bilateral TM's without injections, bulging, erythema. Nose normal, uvula midline, oropharynx clear and moist. EYES: PERRLA, bilateral EOM and conjunctiva normal NECK: FROM, supple, without thyromegaly or mass CARDIO: RRR, normal S1 and S2, distal pulses intact, no carotid bruits PULM/CHEST CTA bilateral, no wheezes, rales or rhonchi. Non tender. ABD: appearance normal, soft, mild suprapubic tenderness. No CVA tenderness, no guarding or rebound. Normal bowel sounds x 4 quadrants, non palpable liver, kidney, spleen. GU: deferred. MUSC: FROM U/LE bilateral, FROM thoracic and lumbar spine. LYMPH: no cervical, supraclavicular adenopathy NEURO: alert and oriented x 3, no cranial nerve deficit, motor strength and coordination NL. Negative romberg.  DTR's normal. Gait normal. SKIN: warm, dry, no rash or lesions noted. PSYCH: Mood and affect normal, speech normal.  Results for orders placed in visit on 12/14/13  POCT URINALYSIS DIPSTICK      Result Value Ref Range   Color, UA colorless     Clarity, UA clear     Glucose, UA NEG     Bilirubin, UA NEG     Ketones, UA NEG     Spec Grav, UA <=1.005     Blood, UA NEG     pH, UA 5.0     Protein, UA NEG     Urobilinogen, UA 0.2     Nitrite, UA NEG     Leukocytes, UA moderate (2+)       ASSESSMENT and  PLAN   CPX/v70.0 - Patient has been counseled on age-appropriate routine health concerns for screening and prevention. These are reviewed and up-to-date. Immunizations are up-to-date or declined. Labs ordered and will be reviewed.  HM: TDP today Referral to GYN DEXA: patient to verify date of previous  UTI:  Macrobid 100 mg bid x five days RTO if no improvement  of symptoms  Arthritis: treated with an occasional Ibuprofen  Depression:  treated with Prozac  Anxiety: treated with Alprazolam  Restless Leg Syndrome: treated with Clonazepam at bedtime  High cholesterol  treated with Pravastatin  Labs today  Constipation: intermittent treated with Amitiza  Eosinophilic Fasciitis: not currently on any medication

## 2013-12-14 NOTE — Progress Notes (Signed)
Pre-visit discussion using our clinic review tool. No additional management support is needed unless otherwise documented below in the visit note.  

## 2013-12-14 NOTE — Patient Instructions (Addendum)
Nice to see you today [name].  Labs have been ordered for you, when you report to lab please be fasting.   I have reviewed your current medical conditions and medications, please continue on current medications as prescribed.   Health Maintenance, Female A healthy lifestyle and preventative care can promote health and wellness.  Maintain regular health, dental, and eye exams.  Eat a healthy diet. Foods like vegetables, fruits, whole grains, low-fat dairy products, and lean protein foods contain the nutrients you need without too many calories. Decrease your intake of foods high in solid fats, added sugars, and salt. Get information about a proper diet from your caregiver, if necessary.  Regular physical exercise is one of the most important things you can do for your health. Most adults should get at least 150 minutes of moderate-intensity exercise (any activity that increases your heart rate and causes you to sweat) each week. In addition, most adults need muscle-strengthening exercises on 2 or more days a week.   Maintain a healthy weight. The body mass index (BMI) is a screening tool to identify possible weight problems. It provides an estimate of body fat based on height and weight. Your caregiver can help determine your BMI, and can help you achieve or maintain a healthy weight. For adults 20 years and older:  A BMI below 18.5 is considered underweight.  A BMI of 18.5 to 24.9 is normal.  A BMI of 25 to 29.9 is considered overweight.  A BMI of 30 and above is considered obese.  Maintain normal blood lipids and cholesterol by exercising and minimizing your intake of saturated fat. Eat a balanced diet with plenty of fruits and vegetables. Blood tests for lipids and cholesterol should begin at age 32 and be repeated every 5 years. If your lipid or cholesterol levels are high, you are over 50, or you are a high risk for heart disease, you may need your cholesterol levels checked more  frequently.Ongoing high lipid and cholesterol levels should be treated with medicines if diet and exercise are not effective.  If you smoke, find out from your caregiver how to quit. If you do not use tobacco, do not start.  Lung cancer screening is recommended for adults aged 70 80 years who are at high risk for developing lung cancer because of a history of smoking. Yearly low-dose computed tomography (CT) is recommended for people who have at least a 30-pack-year history of smoking and are a current smoker or have quit within the past 15 years. A pack year of smoking is smoking an average of 1 pack of cigarettes a day for 1 year (for example: 1 pack a day for 30 years or 2 packs a day for 15 years). Yearly screening should continue until the smoker has stopped smoking for at least 15 years. Yearly screening should also be stopped for people who develop a health problem that would prevent them from having lung cancer treatment.  If you are pregnant, do not drink alcohol. If you are breastfeeding, be very cautious about drinking alcohol. If you are not pregnant and choose to drink alcohol, do not exceed 1 drink per day. One drink is considered to be 12 ounces (355 mL) of beer, 5 ounces (148 mL) of wine, or 1.5 ounces (44 mL) of liquor.  Avoid use of street drugs. Do not share needles with anyone. Ask for help if you need support or instructions about stopping the use of drugs.  High blood pressure causes heart  disease and increases the risk of stroke. Blood pressure should be checked at least every 1 to 2 years. Ongoing high blood pressure should be treated with medicines, if weight loss and exercise are not effective.  If you are 24 to 70 years old, ask your caregiver if you should take aspirin to prevent strokes.  Diabetes screening involves taking a blood sample to check your fasting blood sugar level. This should be done once every 3 years, after age 64, if you are within normal weight and  without risk factors for diabetes. Testing should be considered at a younger age or be carried out more frequently if you are overweight and have at least 1 risk factor for diabetes.  Breast cancer screening is essential preventative care for women. You should practice "breast self-awareness." This means understanding the normal appearance and feel of your breasts and may include breast self-examination. Any changes detected, no matter how small, should be reported to a caregiver. Women in their 27s and 30s should have a clinical breast exam (CBE) by a caregiver as part of a regular health exam every 1 to 3 years. After age 39, women should have a CBE every year. Starting at age 85, women should consider having a mammogram (breast X-ray) every year. Women who have a family history of breast cancer should talk to their caregiver about genetic screening. Women at a high risk of breast cancer should talk to their caregiver about having an MRI and a mammogram every year.  Breast cancer gene (BRCA)-related cancer risk assessment is recommended for women who have family members with BRCA-related cancers. BRCA-related cancers include breast, ovarian, tubal, and peritoneal cancers. Having family members with these cancers may be associated with an increased risk for harmful changes (mutations) in the breast cancer genes BRCA1 and BRCA2. Results of the assessment will determine the need for genetic counseling and BRCA1 and BRCA2 testing.  The Pap test is a screening test for cervical cancer. Women should have a Pap test starting at age 15. Between ages 25 and 34, Pap tests should be repeated every 2 years. Beginning at age 85, you should have a Pap test every 3 years as long as the past 3 Pap tests have been normal. If you had a hysterectomy for a problem that was not cancer or a condition that could lead to cancer, then you no longer need Pap tests. If you are between ages 108 and 24, and you have had normal Pap tests  going back 10 years, you no longer need Pap tests. If you have had past treatment for cervical cancer or a condition that could lead to cancer, you need Pap tests and screening for cancer for at least 20 years after your treatment. If Pap tests have been discontinued, risk factors (such as a new sexual partner) need to be reassessed to determine if screening should be resumed. Some women have medical problems that increase the chance of getting cervical cancer. In these cases, your caregiver may recommend more frequent screening and Pap tests.  The human papillomavirus (HPV) test is an additional test that may be used for cervical cancer screening. The HPV test looks for the virus that can cause the cell changes on the cervix. The cells collected during the Pap test can be tested for HPV. The HPV test could be used to screen women aged 61 years and older, and should be used in women of any age who have unclear Pap test results. After the age of  68, women should have HPV testing at the same frequency as a Pap test.  Colorectal cancer can be detected and often prevented. Most routine colorectal cancer screening begins at the age of 61 and continues through age 65. However, your caregiver may recommend screening at an earlier age if you have risk factors for colon cancer. On a yearly basis, your caregiver may provide home test kits to check for hidden blood in the stool. Use of a small camera at the end of a tube, to directly examine the colon (sigmoidoscopy or colonoscopy), can detect the earliest forms of colorectal cancer. Talk to your caregiver about this at age 33, when routine screening begins. Direct examination of the colon should be repeated every 5 to 10 years through age 3, unless early forms of pre-cancerous polyps or small growths are found.  Hepatitis C blood testing is recommended for all people born from [birth year] through 1967 and any individual with known risks for hepatitis C.  Practice safe sex.  Use condoms and avoid high-risk sexual practices to reduce the spread of sexually transmitted infections (STIs). Sexually active women aged 42 and younger should be checked for Chlamydia, which is a common sexually transmitted infection. Older women with new or multiple partners should also be tested for Chlamydia. Testing for other STIs is recommended if you are sexually active and at increased risk.  Osteoporosis is a disease in which the bones lose minerals and strength with aging. This can result in serious bone fractures. The risk of osteoporosis can be identified using a bone density scan. Women ages 29 and over and women at risk for fractures or osteoporosis should discuss screening with their caregivers. Ask your caregiver whether you should be taking a calcium supplement or vitamin D to reduce the rate of osteoporosis.  Menopause can be associated with physical symptoms and risks. Hormone replacement therapy is available to decrease symptoms and risks. You should talk to your caregiver about whether hormone replacement therapy is right for you.  Use sunscreen. Apply sunscreen liberally and repeatedly throughout the day. You should seek shade when your shadow is shorter than you. Protect yourself by wearing long sleeves, pants, a wide-brimmed hat, and sunglasses year round, whenever you are outdoors.  Notify your caregiver of new moles or changes in moles, especially if there is a change in shape or color. Also notify your caregiver if a mole is larger than the size of a pencil eraser.  Stay current with your immunizations. Document Released: 04/14/2011 Document Revised: 01/24/2013 Document Reviewed: 04/14/2011 [facility] Patient Information 2014 [place].      Urinary Tract Infection A urinary tract infection (UTI) can occur any place along the urinary tract. The tract includes the kidneys, ureters, bladder, and urethra. A type of germ called bacteria often causes a UTI. UTIs are  often helped with antibiotic medicine.  HOME CARE   If given, take antibiotics as told by your doctor. Finish them even if you start to feel better.  Drink enough fluids to keep your pee (urine) clear or pale yellow.  Avoid tea, drinks with caffeine, and bubbly (carbonated) drinks.  Pee often. Avoid holding your pee in for a long time.  Pee before and after having sex (intercourse).  Wipe from front to back after you poop (bowel movement) if you are a woman. Use each tissue only once. GET HELP RIGHT AWAY IF:   You have back pain.  You have lower belly (abdominal) pain.  You have chills.  You  feel sick to your stomach (nauseous).  You throw up (vomit).  Your burning or discomfort with peeing does not go away.  You have a fever.  Your symptoms are not better in 3 days. MAKE SURE YOU:   Understand these instructions.  Will watch your condition.  Will get help right away if you are not doing well or get worse. Document Released: 03/17/2008 Document Revised: 06/23/2012 Document Reviewed: 04/29/2012 [facility] Patient Information 2014 [place], [place].

## 2013-12-15 ENCOUNTER — Encounter: Payer: Self-pay | Admitting: Physician Assistant

## 2013-12-15 ENCOUNTER — Other Ambulatory Visit: Payer: Self-pay | Admitting: *Deleted

## 2013-12-15 DIAGNOSIS — F419 Anxiety disorder, unspecified: Secondary | ICD-10-CM | POA: Insufficient documentation

## 2013-12-15 DIAGNOSIS — E7800 Pure hypercholesterolemia, unspecified: Secondary | ICD-10-CM

## 2013-12-15 DIAGNOSIS — G2581 Restless legs syndrome: Secondary | ICD-10-CM | POA: Insufficient documentation

## 2013-12-15 DIAGNOSIS — E785 Hyperlipidemia, unspecified: Secondary | ICD-10-CM | POA: Insufficient documentation

## 2013-12-15 HISTORY — DX: Pure hypercholesterolemia, unspecified: E78.00

## 2013-12-15 HISTORY — DX: Anxiety disorder, unspecified: F41.9

## 2013-12-15 HISTORY — DX: Restless legs syndrome: G25.81

## 2013-12-15 MED ORDER — NITROFURANTOIN MONOHYD MACRO 100 MG PO CAPS: 100.0000 mg | ORAL_CAPSULE | Freq: Two times a day (BID) | ORAL | Status: DC

## 2013-12-15 NOTE — Assessment & Plan Note (Signed)
treated with Pravastatin 40 mg one tab in evening Labs today

## 2013-12-15 NOTE — Assessment & Plan Note (Signed)
treated with Prozac 40 mg once daily

## 2013-12-15 NOTE — Assessment & Plan Note (Signed)
Treated with Alprazolam 0.5 mg prn

## 2013-12-15 NOTE — Assessment & Plan Note (Signed)
treated with Clonazepam 0,5 mg at bedtime

## 2013-12-20 ENCOUNTER — Telehealth: Payer: Self-pay | Admitting: Physician Assistant

## 2013-12-20 NOTE — Telephone Encounter (Signed)
Received 41 pages from [facility] @ [address] sent to [employer], PA-C. 12/20/13/ss

## 2013-12-27 ENCOUNTER — Encounter: Payer: Self-pay | Admitting: Obstetrics & Gynecology

## 2014-01-10 ENCOUNTER — Encounter: Payer: Self-pay | Admitting: Physician Assistant

## 2014-02-02 ENCOUNTER — Encounter: Payer: 59 | Admitting: Obstetrics & Gynecology

## 2014-02-24 ENCOUNTER — Encounter: Payer: Self-pay | Admitting: Internal Medicine

## 2014-02-28 ENCOUNTER — Encounter: Payer: Self-pay | Admitting: Internal Medicine

## 2014-03-03 NOTE — Telephone Encounter (Signed)
Pt called to check up this request, pt stated that she is trying the get an answer since 02/24/14 (but not answer), pt not sure if she need to go ahead and make an appt. Please advise. [phone]

## 2014-03-10 ENCOUNTER — Ambulatory Visit (INDEPENDENT_AMBULATORY_CARE_PROVIDER_SITE_OTHER): Payer: 59 | Admitting: Internal Medicine

## 2014-03-10 ENCOUNTER — Encounter: Payer: Self-pay | Admitting: Internal Medicine

## 2014-03-10 VITALS — BP 114/68 | HR 72 | Temp 98.2°F | Resp 16 | Ht 61.5 in | Wt 133.0 lb

## 2014-03-10 DIAGNOSIS — D7218 Eosinophilia in diseases classified elsewhere: Secondary | ICD-10-CM

## 2014-03-10 DIAGNOSIS — E785 Hyperlipidemia, unspecified: Secondary | ICD-10-CM

## 2014-03-10 DIAGNOSIS — G2581 Restless legs syndrome: Secondary | ICD-10-CM

## 2014-03-10 DIAGNOSIS — G4700 Insomnia, unspecified: Secondary | ICD-10-CM

## 2014-03-10 DIAGNOSIS — F32A Depression, unspecified: Secondary | ICD-10-CM

## 2014-03-10 DIAGNOSIS — R209 Unspecified disturbances of skin sensation: Secondary | ICD-10-CM

## 2014-03-10 DIAGNOSIS — IMO0001 Reserved for inherently not codable concepts without codable children: Secondary | ICD-10-CM

## 2014-03-10 DIAGNOSIS — M629 Disorder of muscle, unspecified: Secondary | ICD-10-CM

## 2014-03-10 DIAGNOSIS — M354 Diffuse (eosinophilic) fasciitis: Secondary | ICD-10-CM

## 2014-03-10 DIAGNOSIS — R202 Paresthesia of skin: Secondary | ICD-10-CM

## 2014-03-10 DIAGNOSIS — F3289 Other specified depressive episodes: Secondary | ICD-10-CM

## 2014-03-10 DIAGNOSIS — F329 Major depressive disorder, single episode, unspecified: Secondary | ICD-10-CM

## 2014-03-10 DIAGNOSIS — M797 Fibromyalgia: Secondary | ICD-10-CM

## 2014-03-10 MED ORDER — CLONAZEPAM 0.5 MG PO TABS
0.5000 mg | ORAL_TABLET | Freq: Every evening | ORAL | Status: DC | PRN
Start: 2014-03-10 — End: 2014-09-14

## 2014-03-10 NOTE — Assessment & Plan Note (Signed)
Seeing a Dr at [facility]

## 2014-03-10 NOTE — Assessment & Plan Note (Signed)
Rx options discussed

## 2014-03-10 NOTE — Progress Notes (Deleted)
Pre visit review using our clinic review tool, if applicable. No additional management support is needed unless otherwise documented below in the visit note. 

## 2014-03-10 NOTE — Patient Instructions (Signed)
Gluten free trial (no wheat products) for 4-6 weeks. OK to use gluten-free bread and gluten-free pasta.  Milk free trial (no milk, ice cream, cheese and yogurt) for 4-6 weeks. OK to use almond, coconut, rice or soy milk. "Almond breeze" brand tastes good.   Hold Pravachol x 2 months

## 2014-03-10 NOTE — Assessment & Plan Note (Signed)
Doing yoga

## 2014-03-10 NOTE — Assessment & Plan Note (Signed)
Continue with current prescription therapy as reflected on the Med list.  

## 2014-03-10 NOTE — Progress Notes (Signed)
Patient ID: Shannon Sutton, female   DOB: September 03, 1946, 68 y.o.   MRN: [id number]       HPI:  Patient is a 68 year old female who presents to the office to establish care. Patient has had regular scheduled care in past however, insurance has changed and no longer accepted at previous PCP office. States generally in good health. Since retiring, patient has lost 50-60 pounds with exercise and a planned meal diet system. Has had regular GYN visits however provider retired would like referral for new provider. Follow with Dr. Dickie La, GI, for history of colon polyps, to be seen in 2018. Seen here two weeks ago for acute UTI, treated with antibiotic which she finished, complains of continued frequency and pain with urination. 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   Influenza:  Pneumonia: 12/14 Tetanus: more than 10 years ago PAP: hysterectomy LMP: post menopausal Mammogram: 8/14 Eye Dr. Appointment this month for surgical procedure Dentist: sees regularly Colonoscopy: 2013 Dexa scan: unknown  ROS: As stated in HPI. All other systems negative  Past Medical History  Diagnosis Date  . Diverticulosis   . Diverticulitis   . Adenomatous colon polyp 1990  . External hemorrhoids   . Myalgia and myositis, unspecified   . Other disorder of muscle, ligament, and fascia   . Pain in joint, multiple sites   . Depression 11/16/2012  . 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 Fibromyalgia with myofascial pain 11/16/2012  . Insomnia 11/16/2012  .  Arthritis   . Headaches, cluster   . Allergy   . Hyperlipidemia   . Urine incontinence   . Anxiety 12/15/2013  . Hypercholesterolemia 12/15/2013  . Restless leg syndrome 12/15/2013  . Hypertension   . Unspecified sleep apnea   . Other specified disease of hair and hair follicles    Family History  Problem Relation Age of Onset  . Colon cancer Father 28  . Heart disease Father   . Cancer Father     COLON  . Colon polyps Mother   . Heart disease Mother   . Breast cancer Paternal Grandmother   . COPD Sister   . Stomach cancer Neg Hx   . Heart disease Other     family history   History   Social History  . Marital Status: Divorced    Spouse Name: N/A    Number of Children: 2  . Years of Education: N/A   Occupational History  . Retired    Social History Main Topics  . Smoking status: Former Smoker    Quit date: 12/19/1986  . Smokeless tobacco: Never Used  . Alcohol Use: Yes     Comment: ONCE OR TWICE A YEAR WILL HAVE WINE  . Drug Use: No  . Sexual Activity: None   Other Topics Concern  . None   Social History Narrative  . None   Past Surgical History  Procedure Laterality Date  . Breast reduction surgery  1995  . Total abdominal hysterectomy  1999  . Abdominoplasty  2006    "tummy tuck"  . Breast surgery  1981    left breast biopsy  . Muscle  biopsy  [id number]    DEEP TISSUE  . Tonsillectomy    . Wisdom tooth extraction     Current Outpatient Prescriptions on File Prior to Visit  Medication Sig Dispense Refill  . ALPRAZolam (XANAX) 0.5 MG tablet Take 0.5 mg by mouth at bedtime as needed.        Marland Kitchen aspirin 81 MG tablet Take 81 mg by mouth daily.      . B Complex-C (SUPER B COMPLEX) TABS Take by mouth.        Marland Kitchen CALCIUM-MAGNESIUM-ZINC PO Take by mouth.        Marland Kitchen CALCIUM-VITAMIN D PO Take by mouth daily.      . Cholecalciferol (VITAMIN D3) 2000 UNITS TABS Take 1 tablet by mouth daily.        . clonazePAM (KLONOPIN) 0.5 MG tablet Take 0.5 mg by mouth at bedtime  as needed.        Marland Kitchen co-enzyme Q-10 30 MG capsule Take 30 mg by mouth 3 (three) times daily.      Marland Kitchen FLUoxetine (PROZAC) 40 MG capsule Take 40 mg by mouth daily.       . folic acid (FOLVITE) 800 MCG tablet Take 400 mcg by mouth daily.      . Glucosamine-Chondroit-Vit C-Mn (GLUCOSAMINE CHONDR 1500 COMPLX PO) Take 1 tablet by mouth daily.      Marland Kitchen lubiprostone (AMITIZA) 24 MCG capsule Take 24 mcg by mouth as needed for constipation.      . Multiple Vitamins-Minerals (MULTIVITAMIN WITH MINERALS) tablet Take 1 tablet by mouth daily.        . Omega-3 Fatty Acids (OMEGA-3 FISH OIL PO) Take by mouth.        . pravastatin (PRAVACHOL) 40 MG tablet Take 40 mg by mouth daily.        . traZODone (DESYREL) 50 MG tablet Take 50 mg by mouth at bedtime.        . cyclobenzaprine (FLEXERIL) 10 MG tablet Take 10 mg by mouth 3 (three) times daily as needed.       . nitrofurantoin, macrocrystal-monohydrate, (MACROBID) 100 MG capsule Take 1 capsule (100 mg total) by mouth 2 (two) times daily.  10 capsule  0   No current facility-administered medications on file prior to visit.   Allergies  Allergen Reactions  . Morphine And Related Itching    agitation  . Promethazine-Codeine Itching    Itching in face    PE: CONSTITUTIONAL: Well developed, well nourished, pleasant, appears stated age, in NAD HEENT: normocephalic, atraumatic, bilateral ext/int canals normal. Bilateral TM's without injections, bulging, erythema. Nose normal, uvula midline, oropharynx clear and moist. EYES: PERRLA, bilateral EOM and conjunctiva normal NECK: FROM, supple, without thyromegaly or mass CARDIO: RRR, normal S1 and S2, distal pulses intact, no carotid bruits PULM/CHEST CTA bilateral, no wheezes, rales or rhonchi. Non tender. ABD: appearance normal, soft, mild suprapubic tenderness. No CVA tenderness, no guarding or rebound. Normal bowel sounds x 4 quadrants, non palpable liver, kidney, spleen. GU: deferred. MUSC: FROM U/LE bilateral,  FROM thoracic and lumbar spine. LYMPH: no cervical, supraclavicular adenopathy NEURO: alert and oriented x 3, no cranial nerve deficit, motor strength and coordination NL. Negative romberg.  DTR's normal. Gait normal. SKIN: warm, dry, no rash or lesions noted. PSYCH: Mood and affect normal, speech normal.  Results for orders placed in visit on 01/10/14  HM MAMMOGRAPHY      Result Value Ref Range   HM Mammogram Normal    HM DEXA SCAN  Result Value Ref Range   HM Dexa Scan pt states was done

## 2014-03-13 ENCOUNTER — Ambulatory Visit (INDEPENDENT_AMBULATORY_CARE_PROVIDER_SITE_OTHER): Payer: 59 | Admitting: Obstetrics and Gynecology

## 2014-03-13 ENCOUNTER — Encounter: Payer: Self-pay | Admitting: Obstetrics and Gynecology

## 2014-03-13 VITALS — BP 111/71 | HR 72 | Temp 98.6°F | Ht 60.5 in | Wt 134.8 lb

## 2014-03-13 DIAGNOSIS — R300 Dysuria: Secondary | ICD-10-CM

## 2014-03-13 DIAGNOSIS — Z1239 Encounter for other screening for malignant neoplasm of breast: Secondary | ICD-10-CM

## 2014-03-13 LAB — POCT URINALYSIS DIP (DEVICE)
Bilirubin Urine: NEGATIVE
GLUCOSE, UA: NEGATIVE mg/dL
Hgb urine dipstick: NEGATIVE
Ketones, ur: NEGATIVE mg/dL
LEUKOCYTES UA: NEGATIVE
Nitrite: NEGATIVE
PROTEIN: NEGATIVE mg/dL
Specific Gravity, Urine: 1.01 (ref 1.005–1.030)
UROBILINOGEN UA: 0.2 mg/dL (ref 0.0–1.0)
pH: 6.5 (ref 5.0–8.0)

## 2014-03-13 NOTE — Progress Notes (Signed)
Patient ID: Shannon Sutton, female   DOB: 1946/03/28, 68 y.o.   MRN: [id number] 68 yo referred from [facility] for pelvic exam. Patient has had a total hysterectomy with BSO in 1999 secondary to abnormal uterine bleeding. Patient denies a history of abnormal pap smear and has not had a pap smear since hysterectomy. Patient is without complaints other than some abdominal discomfort which improve following a bowel movement. She admits to not being very regular and having a bowel movement once a week. Patient is otherwise without any other complaints.  Past Medical History  Diagnosis Date  . Diverticulosis   . Diverticulitis   . Adenomatous colon polyp 1990  . External hemorrhoids   . Myalgia and myositis, unspecified   . Other disorder of muscle, ligament, and fascia   . Pain in joint, multiple sites   . Depression 11/16/2012  . Eosinophilic fasciitis 01/17/4258  . Fibromyalgia with myofascial pain 11/16/2012  . Insomnia 11/16/2012  . Arthritis   . Headaches, cluster   . Allergy   . Hyperlipidemia   . Urine incontinence   . Anxiety 12/15/2013  . Hypercholesterolemia 12/15/2013  . Restless leg syndrome 12/15/2013  . Hypertension   . Unspecified sleep apnea   . Other specified disease of hair and hair follicles   . Chronic constipation    Past Surgical History  Procedure Laterality Date  . Breast reduction surgery  1995  . Total abdominal hysterectomy  1999  . Abdominoplasty  2006    "tummy tuck"  . Breast surgery  1981    left breast biopsy  . Muscle biopsy  [id number]    DEEP TISSUE  . Tonsillectomy    . Wisdom tooth extraction     Family History  Problem Relation Age of Onset  . Colon cancer Father 56  . Heart disease Father   . Cancer Father     COLON  . Colon polyps Mother   . Heart disease Mother   . Breast cancer Paternal Grandmother   . COPD Sister   . Stomach cancer Neg Hx   . Heart disease Other     family history   History  Substance Use Topics  . Smoking status: Former  Smoker    Quit date: 12/19/1986  . Smokeless tobacco: Never Used  . Alcohol Use: Yes     Comment: ONCE OR TWICE A YEAR WILL HAVE WINE   GENERAL: Well-developed, well-nourished female in no acute distress.  HEENT: Normocephalic, atraumatic. Sclerae anicteric.  NECK: Supple. Normal thyroid.  LUNGS: Clear to auscultation bilaterally.  HEART: Regular rate and rhythm. BREASTS: Symmetric in size. No palpable masses or lymphadenopathy, skin changes, or nipple drainage. ABDOMEN: Soft, nontender, nondistended. No organomegaly. PELVIC: Normal external female genitalia. Vagina is atrophic. No adnexal mass or tenderness. Palpable stool EXTREMITIES: No cyanosis, clubbing, or edema, 2+ distal pulses.  A/P 68 yo with normal exam - Suspect abdominal pain secondary to constipation - Recommended increased water intake and increased fiber intake  - Mammogram referral provided - No need for pap smears - RTC prn

## 2014-03-15 LAB — URINE CULTURE
COLONY COUNT: NO GROWTH
Organism ID, Bacteria: NO GROWTH

## 2014-03-28 ENCOUNTER — Ambulatory Visit (HOSPITAL_COMMUNITY)
Admission: RE | Admit: 2014-03-28 | Discharge: 2014-03-28 | Disposition: A | Discharge status: Home / Self Care | Payer: 59 | Source: Ambulatory Visit | Attending: Obstetrics and Gynecology | Admitting: Obstetrics and Gynecology

## 2014-03-28 ENCOUNTER — Other Ambulatory Visit: Payer: Self-pay | Admitting: Obstetrics and Gynecology

## 2014-03-28 DIAGNOSIS — Z1231 Encounter for screening mammogram for malignant neoplasm of breast: Secondary | ICD-10-CM

## 2014-03-28 DIAGNOSIS — Z1239 Encounter for other screening for malignant neoplasm of breast: Secondary | ICD-10-CM

## 2014-04-06 ENCOUNTER — Other Ambulatory Visit: Payer: Self-pay | Admitting: *Deleted

## 2014-04-06 MED ORDER — LUBIPROSTONE 24 MCG PO CAPS: 24.0000 ug | ORAL_CAPSULE | ORAL | Status: DC | PRN

## 2014-04-13 ENCOUNTER — Other Ambulatory Visit: Payer: Self-pay | Admitting: *Deleted

## 2014-04-13 MED ORDER — FLUOXETINE HCL 40 MG PO CAPS: 40.0000 mg | ORAL_CAPSULE | Freq: Every day | ORAL | Status: DC

## 2014-04-27 ENCOUNTER — Encounter: Payer: Self-pay | Admitting: Internal Medicine

## 2014-05-04 ENCOUNTER — Other Ambulatory Visit: Payer: Self-pay | Admitting: Internal Medicine

## 2014-05-04 DIAGNOSIS — M810 Age-related osteoporosis without current pathological fracture: Secondary | ICD-10-CM

## 2014-05-31 ENCOUNTER — Ambulatory Visit (INDEPENDENT_AMBULATORY_CARE_PROVIDER_SITE_OTHER)
Admission: RE | Admit: 2014-05-31 | Discharge: 2014-05-31 | Disposition: A | Discharge status: Home / Self Care | Payer: 59 | Source: Ambulatory Visit | Attending: Internal Medicine | Admitting: Internal Medicine

## 2014-05-31 DIAGNOSIS — M810 Age-related osteoporosis without current pathological fracture: Secondary | ICD-10-CM

## 2014-08-14 ENCOUNTER — Encounter: Payer: Self-pay | Admitting: Obstetrics and Gynecology

## 2014-08-30 ENCOUNTER — Ambulatory Visit (INDEPENDENT_AMBULATORY_CARE_PROVIDER_SITE_OTHER): Payer: 59 | Admitting: Family

## 2014-08-30 ENCOUNTER — Encounter: Payer: Self-pay | Admitting: Family

## 2014-08-30 ENCOUNTER — Other Ambulatory Visit (INDEPENDENT_AMBULATORY_CARE_PROVIDER_SITE_OTHER): Payer: 59

## 2014-08-30 VITALS — BP 120/68 | HR 72 | Temp 97.9°F | Resp 18 | Ht 61.0 in | Wt 135.1 lb

## 2014-08-30 DIAGNOSIS — R5383 Other fatigue: Secondary | ICD-10-CM | POA: Insufficient documentation

## 2014-08-30 DIAGNOSIS — R51 Headache: Secondary | ICD-10-CM

## 2014-08-30 DIAGNOSIS — R519 Headache, unspecified: Secondary | ICD-10-CM

## 2014-08-30 LAB — CBC WITH DIFFERENTIAL/PLATELET
Basophils Absolute: 0 10*3/uL (ref 0.0–0.1)
Basophils Relative: 0.7 % (ref 0.0–3.0)
Eosinophils Absolute: 0.2 10*3/uL (ref 0.0–0.7)
Eosinophils Relative: 3.3 % (ref 0.0–5.0)
HCT: 39.7 % (ref 36.0–46.0)
HEMOGLOBIN: 13.3 g/dL (ref 12.0–15.0)
Lymphocytes Relative: 32.8 % (ref 12.0–46.0)
Lymphs Abs: 2.1 10*3/uL (ref 0.7–4.0)
MCHC: 33.4 g/dL (ref 30.0–36.0)
MCV: 92.5 fl (ref 78.0–100.0)
MONOS PCT: 7.3 % (ref 3.0–12.0)
Monocytes Absolute: 0.5 10*3/uL (ref 0.1–1.0)
NEUTROS ABS: 3.6 10*3/uL (ref 1.4–7.7)
Neutrophils Relative %: 55.9 % (ref 43.0–77.0)
PLATELETS: 209 10*3/uL (ref 150.0–400.0)
RBC: 4.29 Mil/uL (ref 3.87–5.11)
RDW: 12.7 % (ref 11.5–15.5)
WBC: 6.4 10*3/uL (ref 4.0–10.5)

## 2014-08-30 LAB — BASIC METABOLIC PANEL
BUN: 17 mg/dL (ref 6–23)
CO2: 27 meq/L (ref 19–32)
Calcium: 9.3 mg/dL (ref 8.4–10.5)
Chloride: 106 mEq/L (ref 96–112)
Creatinine, Ser: 0.6 mg/dL (ref 0.4–1.2)
GFR: 99.12 mL/min (ref >60.00)
Glucose, Bld: 86 mg/dL (ref 70–99)
Potassium: 3.8 mEq/L (ref 3.5–5.1)
Sodium: 142 mEq/L (ref 135–145)

## 2014-08-30 LAB — IRON AND TIBC
%SAT: 29 % (ref 20–55)
IRON: 85 ug/dL (ref 42–145)
TIBC: 297 ug/dL (ref 250–470)
UIBC: 212 ug/dL (ref 125–400)

## 2014-08-30 LAB — FERRITIN: Ferritin: 48.1 ng/mL (ref 10.0–291.0)

## 2014-08-30 LAB — IRON: IRON: 83 ug/dL (ref 42–145)

## 2014-08-30 LAB — TSH: TSH: 1.62 u[IU]/mL (ref 0.35–4.50)

## 2014-08-30 NOTE — Assessment & Plan Note (Signed)
Fatigue with questionable origin. Obtain CBC, BMET, Iron levels, and TSH. Cannot rule out exacerbation of depression, fibromyalgia, or coronary artery disease. Pt would like to wait for lab results before proceeding with further treatments. Consider referral to cardiology if lab results are normal.

## 2014-08-30 NOTE — Patient Instructions (Signed)
Thank you for choosing [facility].  Summary/Instructions:  Please stop by the lab on the basement level of the building for your blood work. Your results will be released to MyChart (or called to you) after review, usually within 72 hours after test completion. If any changes need to be made, you will be notified at that same time.  If your symptoms worsen or fail to improve, please contact our office for further instruction, or in case of emergency go directly to the emergency room at the closest medical facility.    Fatigue Fatigue is a feeling of tiredness, lack of energy, lack of motivation, or feeling tired all the time. Having enough rest, good nutrition, and reducing stress will normally reduce fatigue. Consult your caregiver if it persists. The nature of your fatigue will help your caregiver to find out its cause. The treatment is based on the cause.  CAUSES  There are many causes for fatigue. Most of the time, fatigue can be traced to one or more of your habits or routines. Most causes fit into one or more of three general areas. They are: Lifestyle problems  Sleep disturbances.  Overwork.  Physical exertion.  Unhealthy habits.  Poor eating habits or eating disorders.  Alcohol and/or drug use .  Lack of proper nutrition (malnutrition). Psychological problems  Stress and/or anxiety problems.  Depression.  Grief.  Boredom. Medical Problems or Conditions  Anemia.  Pregnancy.  Thyroid gland problems.  Recovery from major surgery.  Continuous pain.  Emphysema or asthma that is not well controlled  Allergic conditions.  Diabetes.  Infections (such as mononucleosis).  Obesity.  Sleep disorders, such as sleep apnea.  Heart failure or other heart-related problems.  Cancer.  Kidney disease.  Liver disease.  Effects of certain medicines such as antihistamines, cough and cold remedies, prescription pain medicines, heart and blood pressure  medicines, drugs used for treatment of cancer, and some antidepressants. SYMPTOMS  The symptoms of fatigue include:   Lack of energy.  Lack of drive (motivation).  Drowsiness.  Feeling of indifference to the surroundings. DIAGNOSIS  The details of how you feel help guide your caregiver in finding out what is causing the fatigue. You will be asked about your present and past health condition. It is important to review all medicines that you take, including prescription and non-prescription items. A thorough exam will be done. You will be questioned about your feelings, habits, and normal lifestyle. Your caregiver may suggest blood tests, urine tests, or other tests to look for common medical causes of fatigue.  TREATMENT  Fatigue is treated by correcting the underlying cause. For example, if you have continuous pain or depression, treating these causes will improve how you feel. Similarly, adjusting the dose of certain medicines will help in reducing fatigue.  HOME CARE INSTRUCTIONS   Try to get the required amount of good sleep every night.  Eat a healthy and nutritious diet, and drink enough water throughout the day.  Practice ways of relaxing (including yoga or meditation).  Exercise regularly.  Make plans to change situations that cause stress. Act on those plans so that stresses decrease over time. Keep your work and personal routine reasonable.  Avoid street drugs and minimize use of alcohol.  Start taking a daily multivitamin after consulting your caregiver. SEEK MEDICAL CARE IF:   You have persistent tiredness, which cannot be accounted for.  You have fever.  You have unintentional weight loss.  You have headaches.  You have disturbed sleep   throughout the night.  You are feeling sad.  You have constipation.  You have dry skin.  You have gained weight.  You are taking any new or different medicines that you suspect are causing fatigue.  You are unable to  sleep at night.  You develop any unusual swelling of your legs or other parts of your body. SEEK IMMEDIATE MEDICAL CARE IF:   You are feeling confused.  Your vision is blurred.  You feel faint or pass out.  You develop severe headache.  You develop severe abdominal, pelvic, or back pain.  You develop chest pain, shortness of breath, or an irregular or fast heartbeat.  You are unable to pass a normal amount of urine.  You develop abnormal bleeding such as bleeding from the rectum or you vomit blood.  You have thoughts about harming yourself or committing suicide.  You are worried that you might harm someone else. MAKE SURE YOU:   Understand these instructions.  Will watch your condition.  Will get help right away if you are not doing well or get worse. Document Released: 07/27/2007 Document Revised: 12/22/2011 Document Reviewed: 01/31/2014 [employer] Patient Information 2015 [employer]. This information is not intended to replace advice given to you by your health care provider. Make sure you discuss any questions you have with your health care provider.  

## 2014-08-30 NOTE — Progress Notes (Signed)
Subjective:    Patient ID: Shannon Sutton, female    DOB: 01-24-1946, 68 y.o.   MRN: [id number]  Chief Complaint  Patient presents with  . Fatigue    Feeling fatigue, weak, blurred vison, headache that is almost everyday and worse at night x2 months    HPI:  Shannon Sutton is a 68 y.o. female who presents today for an office visit.    1) Exhaustion - Acute symptoms have been going on for the past couple of months at worst feels like she cannot lift her arms. Normally active individual and is able to complete exercise classes, but notes she feels weaker. Does not wake up feeling refreshed. Sometimes it is painful and sometimes it is exhaustion. The course of the current symptoms is waxing and waning. There is nothing that makes it better or worse.   2) Headache - Initially thought had to do with allergies, eyes were burning. They did not improve with antihistamine. Starts at neck and works its way up from her neck around her temple. Describes the headache as throbbing and some burning which gradually builds in intensity. Sensitivity to light and nausea. Headaches typically last 4-5 hours. Indicates she has 3x for the bad headaches. Has used aspirin for the pain which occasionally helps.  Allergies  Allergen Reactions  . Morphine And Related Itching    agitation  . Promethazine-Codeine Itching    Itching in face   Current Outpatient Prescriptions on File Prior to Visit  Medication Sig Dispense Refill  . ALPRAZolam (XANAX) 0.5 MG tablet Take 0.5 mg by mouth at bedtime as needed for anxiety.    Marland Kitchen aspirin 81 MG tablet Take 81 mg by mouth daily.    . B Complex-C (SUPER B COMPLEX) TABS Take 1 tablet by mouth daily.     Marland Kitchen CALCIUM-MAGNESIUM-ZINC PO Take 1 capsule by mouth daily.     . Cholecalciferol (VITAMIN D3) 2000 UNITS TABS Take 1 tablet by mouth daily.      . clonazePAM (KLONOPIN) 0.5 MG tablet Take 1-2 tablets (0.5-1 mg total) by mouth at bedtime as needed. 60 tablet 5  . co-enzyme  Q-10 30 MG capsule Take 30 mg by mouth 3 (three) times daily.    Marland Kitchen FLUoxetine (PROZAC) 40 MG capsule Take 1 capsule (40 mg total) by mouth daily. 90 capsule 2  . folic acid (FOLVITE) 800 MCG tablet Take 400 mcg by mouth daily.    . Glucosamine-Chondroit-Vit C-Mn (GLUCOSAMINE CHONDR 1500 COMPLX PO) Take 1 tablet by mouth daily.    Marland Kitchen lubiprostone (AMITIZA) 24 MCG capsule Take 1 capsule (24 mcg total) by mouth as needed for constipation. 180 capsule 2  . Multiple Vitamins-Minerals (MULTIVITAMIN WITH MINERALS) tablet Take 1 tablet by mouth daily.      . Omega-3 Fatty Acids (OMEGA-3 FISH OIL PO) Take by mouth.      . pravastatin (PRAVACHOL) 40 MG tablet Take 40 mg by mouth daily.      . traZODone (DESYREL) 50 MG tablet Take 50 mg by mouth at bedtime.       No current facility-administered medications on file prior to visit.   Past Medical History  Diagnosis Date  . Diverticulosis   . Diverticulitis   . Adenomatous colon polyp 1990  . External hemorrhoids   . Myalgia and myositis, unspecified   . Other disorder of muscle, ligament, and fascia   . Pain in joint, multiple sites   . Depression 11/16/2012  . 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  Fibromyalgia with myofascial pain 11/16/2012  . Insomnia 11/16/2012  . Arthritis   . Headaches, cluster   . Allergy   . Hyperlipidemia   . Urine incontinence   . Anxiety 12/15/2013  . Hypercholesterolemia 12/15/2013  . Restless leg syndrome 12/15/2013  . Hypertension   . Unspecified sleep apnea   . Other specified disease of hair and hair follicles   . Chronic constipation     Review of Systems    See HPI  Objective:    BP 120/68 mmHg  Pulse 72  Temp(Src) 97.9 F (36.6 C) (Oral)  Resp 18  Ht 5\' 1"  (1.549 m)  Wt 135 lb 1.9 oz (61.29 kg)  BMI 25.54 kg/m2  SpO2 99% Nursing note and vital signs reviewed.  Physical Exam  Constitutional: She is oriented to person, place, and time. She appears well-developed and well-nourished. No distress.  HENT:    Right Ear: Hearing, tympanic membrane, external ear and ear canal normal.  Left Ear: Hearing, tympanic membrane, external ear and ear canal normal.  Mouth/Throat: Uvula is midline, oropharynx is clear and moist and mucous membranes are normal.  Neck: Neck supple.  Cardiovascular: Normal rate, regular rhythm, normal heart sounds and intact distal pulses.   Pulmonary/Chest: Effort normal and breath sounds normal.  Lymphadenopathy:    She has no cervical adenopathy.  Neurological: She is alert and oriented to person, place, and time. She has normal reflexes. No cranial nerve deficit. Coordination normal.  Skin: Skin is warm and dry.  Psychiatric: She has a normal mood and affect. Her behavior is normal. Judgment and thought content normal.       Assessment & Plan:

## 2014-08-30 NOTE — Assessment & Plan Note (Signed)
Symptoms consistent with migraine and cluster headaches or possible combination. Pt would like to hold off on medication at this time until labs return. Advised for potential use of imitrex to assist with headache management. Follow up as needed.

## 2014-08-30 NOTE — Progress Notes (Signed)
Pre visit review using our clinic review tool, if applicable. No additional management support is needed unless otherwise documented below in the visit note. 

## 2014-09-04 ENCOUNTER — Encounter: Payer: Self-pay | Admitting: Internal Medicine

## 2014-09-04 ENCOUNTER — Other Ambulatory Visit: Payer: Self-pay | Admitting: Family

## 2014-09-04 DIAGNOSIS — R5383 Other fatigue: Secondary | ICD-10-CM

## 2014-09-14 ENCOUNTER — Other Ambulatory Visit: Payer: Self-pay | Admitting: Internal Medicine

## 2014-10-18 ENCOUNTER — Encounter: Payer: Self-pay | Admitting: Cardiology

## 2014-10-18 ENCOUNTER — Ambulatory Visit (INDEPENDENT_AMBULATORY_CARE_PROVIDER_SITE_OTHER): Payer: Medicare HMO | Admitting: Cardiology

## 2014-10-18 VITALS — BP 130/70 | HR 72 | Ht 61.0 in | Wt 132.1 lb

## 2014-10-18 DIAGNOSIS — R0600 Dyspnea, unspecified: Secondary | ICD-10-CM

## 2014-10-18 DIAGNOSIS — R5382 Chronic fatigue, unspecified: Secondary | ICD-10-CM

## 2014-10-18 DIAGNOSIS — R0602 Shortness of breath: Secondary | ICD-10-CM

## 2014-10-18 DIAGNOSIS — D7218 Eosinophilia in diseases classified elsewhere: Secondary | ICD-10-CM

## 2014-10-18 DIAGNOSIS — Z8249 Family history of ischemic heart disease and other diseases of the circulatory system: Secondary | ICD-10-CM

## 2014-10-18 DIAGNOSIS — R0609 Other forms of dyspnea: Secondary | ICD-10-CM

## 2014-10-18 DIAGNOSIS — M354 Diffuse (eosinophilic) fasciitis: Secondary | ICD-10-CM

## 2014-10-18 DIAGNOSIS — E785 Hyperlipidemia, unspecified: Secondary | ICD-10-CM

## 2014-10-18 LAB — CORTISOL: Cortisol, Plasma: 6.4 ug/dL

## 2014-10-18 NOTE — Patient Instructions (Signed)
Your physician recommends that you continue on your current medications as directed. Please refer to the Current Medication list given to you today.   Your physician recommends that you return for lab work in: [place] ([place])     Your physician has requested that you have a stress echocardiogram. For further information please visit [email or web address]. Please follow instruction sheet as given.     Your physician recommends that you schedule a follow-up appointment in: [place]

## 2014-10-18 NOTE — Progress Notes (Signed)
Patient ID: TANELL WITTSTRUCK, female   DOB: 08/05/1946, 69 y.o.   MRN: [id number]     Patient Name: Shannon Sutton Date of Encounter: 10/18/2014  Primary Care Provider:  Sonda Primes, MD Primary Cardiologist: Lars Masson   Problem List   Past Medical History  Diagnosis Date  . Diverticulosis   . Diverticulitis   . Adenomatous colon polyp 1990  . External hemorrhoids   . Myalgia and myositis, unspecified   . Other disorder of muscle, ligament, and fascia   . Pain in joint, multiple sites   . Depression 11/16/2012  . 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 Fibromyalgia with myofascial pain 11/16/2012  . Insomnia 11/16/2012  . Arthritis   . Headaches, cluster   . Allergy   . Hyperlipidemia   . Urine incontinence   . Anxiety 12/15/2013  . Hypercholesterolemia 12/15/2013  . Restless leg syndrome 12/15/2013  . Hypertension   . Unspecified sleep apnea   . Other specified disease of hair and hair follicles   . Chronic constipation    Past Surgical History  Procedure Laterality Date  . Breast reduction surgery  1995  . Total abdominal hysterectomy  1999  . Abdominoplasty  2006    "tummy tuck"  . Breast surgery  1981    left breast biopsy  . Muscle biopsy  [id number]    DEEP TISSUE  . Tonsillectomy    . Wisdom tooth extraction      Allergies  Allergies  Allergen Reactions  . Morphine And Related Itching    agitation  . Promethazine-Codeine Itching    Itching in face    HPI  A very pleasant 69-year-old female who is coming with concern of persistent and progressively worsening fatigue and dyspnea on exertion. The patient has history of hyperlipidemia with intolerance to multiple statins currently on pravastatin. She also has history of gout 3 m and dosing of systolic fasciitis on chronic steroid and methotrexate use currently off that treatment. She is fairly significant history of premature coronary artery disease. Her mother had a history of rheumatic heart disease and  replacement of one of the valves and died suddenly of heart attack at age of 69. Her father had multiple myocardial infarction in his 24s and 33s and died of sudden cardiac death at age of 55. The patient is trying to exercise however feels exhausted and tired all the time has started to develop some shortness of breath lately.  Home Medications  Prior to Admission medications   Medication Sig Start Date End Date Taking? Authorizing Provider  ALPRAZolam Prudy Feeler) 0.5 MG tablet Take 0.5 mg by mouth at bedtime as needed for anxiety.   Yes Historical Provider, MD  B Complex-C (SUPER B COMPLEX) TABS Take 1 tablet by mouth daily.    Yes Historical Provider, MD  CALCIUM-MAGNESIUM-ZINC PO Take 1 capsule by mouth daily.    Yes Historical Provider, MD  Cholecalciferol (VITAMIN D3) 2000 UNITS TABS Take 1 tablet by mouth daily.     Yes Historical Provider, MD  clonazePAM (KLONOPIN) 0.5 MG tablet TAKE ONE TO TWO TABLETS BY MOUTH AT BEDTIME AS NEEDED 09/15/14  Yes Aleksei Plotnikov V, MD  FLUoxetine (PROZAC) 40 MG capsule Take 1 capsule (40 mg total) by mouth daily. 04/13/14  Yes Aleksei Plotnikov V, MD  folic acid (FOLVITE) 800 MCG tablet Take 400 mcg by mouth daily.   Yes Historical Provider, MD  lubiprostone (AMITIZA) 24 MCG capsule Take 1 capsule (24 mcg total) by mouth as needed  for constipation. 04/06/14  Yes Aleksei Plotnikov V, MD  Multiple Vitamins-Minerals (MULTIVITAMIN WITH MINERALS) tablet Take 1 tablet by mouth daily.     Yes Historical Provider, MD  Omega-3 Fatty Acids (OMEGA-3 FISH OIL PO) Take by mouth.     Yes Historical Provider, MD  OVER THE COUNTER MEDICATION Take 1 tablet by mouth 2 (two) times daily. BAYER BACK AND BODY WITH CAFFEINE1000-650   Yes Historical Provider, MD  pravastatin (PRAVACHOL) 40 MG tablet Take 40 mg by mouth daily.     Yes Historical Provider, MD  traZODone (DESYREL) 50 MG tablet Take 50 mg by mouth at bedtime.     Yes Historical Provider, MD    Family History  Family  History  Problem Relation Age of Onset  . Colon cancer Father 66  . Heart disease Father   . Cancer Father     COLON  . Colon polyps Mother   . Heart disease Mother   . Breast cancer Paternal Grandmother   . COPD Sister   . Stomach cancer Neg Hx   . Heart disease Other     family history    Social History  History   Social History  . Marital Status: Divorced    Spouse Name: N/A    Number of Children: 2  . Years of Education: N/A   Occupational History  . Retired    Social History Main Topics  . Smoking status: Former Smoker    Quit date: 12/19/1986  . Smokeless tobacco: Never Used  . Alcohol Use: Yes     Comment: ONCE OR TWICE A YEAR WILL HAVE WINE  . Drug Use: No  . Sexual Activity: Yes    Birth Control/ Protection: Surgical   Other Topics Concern  . Not on file   Social History Narrative     Review of Systems, as per HPI, otherwise negative General:  No chills, fever, night sweats or weight changes.  Cardiovascular:  No chest pain, dyspnea on exertion, edema, orthopnea, palpitations, paroxysmal nocturnal dyspnea. Dermatological: No rash, lesions/masses Respiratory: No cough, dyspnea Urologic: No hematuria, dysuria Abdominal:   No nausea, vomiting, diarrhea, bright red blood per rectum, melena, or hematemesis Neurologic:  No visual changes, wkns, changes in mental status. All other systems reviewed and are otherwise negative except as noted above.  Physical Exam  Blood pressure 130/70, pulse 72, height 5\' 1"  (1.549 m), weight 132 lb 1.9 oz (59.929 kg).  General: Pleasant, NAD Psych: Normal affect. Neuro: Alert and oriented X 3. Moves all extremities spontaneously. HEENT: Normal  Neck: Supple without bruits or JVD. Lungs:  Resp regular and unlabored, CTA. Heart: RRR no s3, s4, or murmurs. Abdomen: Soft, non-tender, non-distended, BS + x 4.  Extremities: No clubbing, cyanosis or edema. DP/PT/Radials 2+ and equal bilaterally.  Labs:  No results for  input(s): CKTOTAL, CKMB, TROPONINI in the last 72 hours. Lab Results  Component Value Date   WBC 6.4 08/30/2014   HGB 13.3 08/30/2014   HCT 39.7 08/30/2014   MCV 92.5 08/30/2014   PLT 209.0 08/30/2014    No results found for: DDIMER Invalid input(s): POCBNP    Component Value Date/Time   NA 142 08/30/2014 1446   NA 140 09/19/2013   K 3.8 08/30/2014 1446   CL 106 08/30/2014 1446   CO2 27 08/30/2014 1446   GLUCOSE 86 08/30/2014 1446   BUN 17 08/30/2014 1446   BUN 11 09/19/2013   CREATININE 0.6 08/30/2014 1446   CREATININE 0.6 09/19/2013  CALCIUM 9.3 08/30/2014 1446   PROT 6.6 12/14/2013 1421   ALBUMIN 3.6 12/14/2013 1421   AST 24 12/14/2013 1421   ALT 16 12/14/2013 1421   ALKPHOS 50 12/14/2013 1421   BILITOT 0.9 12/14/2013 1421   Lab Results  Component Value Date   CHOL 187 12/14/2013   HDL 67.70 12/14/2013   LDLCALC 109* 12/14/2013   TRIG 53.0 12/14/2013    Accessory Clinical Findings  echocardiogram  ECG - normal sinus rhythm with nonspecific T-wave abnormalities.    Assessment & Plan  69 year old female  1. Dyspnea on exertion, fatigue with significant risk factors that include hyperlipidemia, significant family history of premature coronary artery disease, history of outer immune disease that predisposes patient for early development of atherosclerosis. Her EKG shows nonspecific changes. We'll schedule a stress echo that will also answer question about possible mitral valve prolapse. We also need to see if patient has normal left ventricular ejection fraction. Normal TSH, we will check cortisol level. If her stress echo is normal we might consider to perform coronary CT in the future especially considering both of her parents died suddenly.  2. Blood pressure - controlled  3. Hyperlipidemia - currently on pravastatin and omega-3 acids, in the past she didn't tolerate other statins we will continue these for now if he finds that her plaque is significantly  might switch to hypodensity statins such as Lipitor or Crestor.  Follow up in 2 months.  Lars Masson, MD, [facility] 10/18/2014, 11:37 AM

## 2014-10-27 ENCOUNTER — Ambulatory Visit (HOSPITAL_COMMUNITY): Payer: Medicare HMO | Attending: Internal Medicine

## 2014-10-27 ENCOUNTER — Ambulatory Visit (HOSPITAL_BASED_OUTPATIENT_CLINIC_OR_DEPARTMENT_OTHER): Payer: Medicare HMO | Admitting: Cardiology

## 2014-10-27 DIAGNOSIS — M354 Diffuse (eosinophilic) fasciitis: Secondary | ICD-10-CM | POA: Diagnosis not present

## 2014-10-27 DIAGNOSIS — E785 Hyperlipidemia, unspecified: Secondary | ICD-10-CM | POA: Diagnosis not present

## 2014-10-27 DIAGNOSIS — R5382 Chronic fatigue, unspecified: Secondary | ICD-10-CM

## 2014-10-27 DIAGNOSIS — R0602 Shortness of breath: Secondary | ICD-10-CM

## 2014-10-27 DIAGNOSIS — R0609 Other forms of dyspnea: Secondary | ICD-10-CM | POA: Diagnosis present

## 2014-10-27 DIAGNOSIS — Z8249 Family history of ischemic heart disease and other diseases of the circulatory system: Secondary | ICD-10-CM | POA: Insufficient documentation

## 2014-10-27 DIAGNOSIS — R0989 Other specified symptoms and signs involving the circulatory and respiratory systems: Secondary | ICD-10-CM

## 2014-10-27 DIAGNOSIS — R0600 Dyspnea, unspecified: Secondary | ICD-10-CM

## 2014-10-27 DIAGNOSIS — D7218 Eosinophilia in diseases classified elsewhere: Secondary | ICD-10-CM

## 2014-10-27 NOTE — Progress Notes (Signed)
Stress echo performed.

## 2014-11-05 ENCOUNTER — Encounter: Payer: Self-pay | Admitting: Internal Medicine

## 2014-11-06 ENCOUNTER — Other Ambulatory Visit: Payer: Self-pay | Admitting: *Deleted

## 2014-11-06 MED ORDER — FLUOXETINE HCL 40 MG PO CAPS: 40.0000 mg | ORAL_CAPSULE | Freq: Every day | ORAL | Status: DC

## 2014-11-06 MED ORDER — PRAVASTATIN SODIUM 40 MG PO TABS: 40.0000 mg | ORAL_TABLET | Freq: Every day | ORAL | Status: DC

## 2014-11-06 NOTE — Telephone Encounter (Signed)
Sent email needing refills on fluoxetine & pravastatin...Shannon Sutton

## 2014-11-15 ENCOUNTER — Ambulatory Visit (INDEPENDENT_AMBULATORY_CARE_PROVIDER_SITE_OTHER): Payer: Medicare HMO

## 2014-11-15 DIAGNOSIS — Z23 Encounter for immunization: Secondary | ICD-10-CM

## 2014-11-20 ENCOUNTER — Other Ambulatory Visit: Payer: Self-pay | Admitting: Internal Medicine

## 2014-11-24 ENCOUNTER — Other Ambulatory Visit: Payer: Self-pay | Admitting: Internal Medicine

## 2014-11-24 NOTE — Telephone Encounter (Signed)
Called pharmacy spoke with emily gave md approval.../lmb

## 2014-12-13 ENCOUNTER — Encounter: Payer: Self-pay | Admitting: Internal Medicine

## 2014-12-13 ENCOUNTER — Other Ambulatory Visit: Payer: Self-pay | Admitting: Internal Medicine

## 2014-12-13 MED ORDER — OSELTAMIVIR PHOSPHATE 75 MG PO CAPS: 75.0000 mg | ORAL_CAPSULE | Freq: Two times a day (BID) | ORAL | Status: DC

## 2014-12-22 ENCOUNTER — Ambulatory Visit (INDEPENDENT_AMBULATORY_CARE_PROVIDER_SITE_OTHER): Payer: Medicare HMO | Admitting: Cardiology

## 2014-12-22 ENCOUNTER — Encounter: Payer: Self-pay | Admitting: Cardiology

## 2014-12-22 VITALS — BP 108/60 | HR 75 | Ht 61.5 in | Wt 129.0 lb

## 2014-12-22 DIAGNOSIS — R0600 Dyspnea, unspecified: Secondary | ICD-10-CM

## 2014-12-22 DIAGNOSIS — E785 Hyperlipidemia, unspecified: Secondary | ICD-10-CM

## 2014-12-22 DIAGNOSIS — R079 Chest pain, unspecified: Secondary | ICD-10-CM

## 2014-12-22 DIAGNOSIS — Z8249 Family history of ischemic heart disease and other diseases of the circulatory system: Secondary | ICD-10-CM

## 2014-12-22 DIAGNOSIS — R0609 Other forms of dyspnea: Secondary | ICD-10-CM

## 2014-12-22 DIAGNOSIS — R5382 Chronic fatigue, unspecified: Secondary | ICD-10-CM

## 2014-12-22 NOTE — Patient Instructions (Signed)
**Note De-Identified  Obfuscation** Your physician recommends that you continue on your current medications as directed. Please refer to the Current Medication list given to you today.  Your physician has requested that you have cardiac CT. Cardiac computed tomography (CT) is a painless test that uses an x-ray machine to take clear, detailed pictures of your heart. For further information please visit [email or web address]. Please follow instruction sheet as given.  Your physician recommends that you schedule a follow-up appointment in: depending on your test results.

## 2014-12-22 NOTE — Progress Notes (Signed)
Patient ID: JAKIYLA BRENNICK, female   DOB: 10/16/1945, 69 y.o.   MRN: [id number]    Patient Name: Shannon Sutton Date of Encounter: 12/22/2014  Primary Care Provider:  Sonda Primes, MD Primary Cardiologist: Lars Masson  Problem List   Past Medical History  Diagnosis Date  . Diverticulosis   . Diverticulitis   . Adenomatous colon polyp 1990  . External hemorrhoids   . Myalgia and myositis, unspecified   . Other disorder of muscle, ligament, and fascia   . Pain in joint, multiple sites   . Depression 11/16/2012  . 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 Fibromyalgia with myofascial pain 11/16/2012  . Insomnia 11/16/2012  . Arthritis   . Headaches, cluster   . Allergy   . Hyperlipidemia   . Urine incontinence   . Anxiety 12/15/2013  . Hypercholesterolemia 12/15/2013  . Restless leg syndrome 12/15/2013  . Hypertension   . Unspecified sleep apnea   . Other specified disease of hair and hair follicles   . Chronic constipation    Past Surgical History  Procedure Laterality Date  . Breast reduction surgery  1995  . Total abdominal hysterectomy  1999  . Abdominoplasty  2006    "tummy tuck"  . Breast surgery  1981    left breast biopsy  . Muscle biopsy  [id number]    DEEP TISSUE  . Tonsillectomy    . Wisdom tooth extraction      Allergies  Allergies  Allergen Reactions  . Morphine And Related Itching    agitation  . Promethazine-Codeine Itching    Itching in face    HPI  A very pleasant 69 year old female who is coming with concern of persistent and progressively worsening fatigue and dyspnea on exertion. The patient has history of hyperlipidemia with intolerance to multiple statins currently on pravastatin. She also has history of gout 3 m and dosing of systolic fasciitis on chronic steroid and methotrexate use currently off that treatment. She is fairly significant history of premature coronary artery disease. Her mother had a history of rheumatic heart disease and  replacement of one of the valves and died suddenly of heart attack at age of 79. Her father had multiple myocardial infarction in his 33s and 65s and died of sudden cardiac death at age of 31. The patient is trying to exercise however feels exhausted and tired all the time has started to develop some shortness of breath lately.  12/22/2014 - the patient is coming after 2 months, in the meantime she underwent stress echocardiogram that was negative for ischemia. The patient continues feeling fatigued and she feels it's progressively worse as well as dyspnea on exertion. She is concerned about that considering both of her parents had history of premature coronary artery disease with no warning signs before heart attacks.  Home Medications  Prior to Admission medications   Medication Sig Start Date End Date Taking? Authorizing Provider  ALPRAZolam Prudy Feeler) 0.5 MG tablet Take 0.5 mg by mouth at bedtime as needed for anxiety.   Yes Historical Provider, MD  B Complex-C (SUPER B COMPLEX) TABS Take 1 tablet by mouth daily.    Yes Historical Provider, MD  CALCIUM-MAGNESIUM-ZINC PO Take 1 capsule by mouth daily.    Yes Historical Provider, MD  Cholecalciferol (VITAMIN D3) 2000 UNITS TABS Take 1 tablet by mouth daily.     Yes Historical Provider, MD  clonazePAM (KLONOPIN) 0.5 MG tablet TAKE ONE TO TWO TABLETS BY MOUTH AT BEDTIME AS NEEDED 09/15/14  Yes  Aleksei Plotnikov V, MD  FLUoxetine (PROZAC) 40 MG capsule Take 1 capsule (40 mg total) by mouth daily. 04/13/14  Yes Aleksei Plotnikov V, MD  folic acid (FOLVITE) 800 MCG tablet Take 400 mcg by mouth daily.   Yes Historical Provider, MD  lubiprostone (AMITIZA) 24 MCG capsule Take 1 capsule (24 mcg total) by mouth as needed for constipation. 04/06/14  Yes Aleksei Plotnikov V, MD  Multiple Vitamins-Minerals (MULTIVITAMIN WITH MINERALS) tablet Take 1 tablet by mouth daily.     Yes Historical Provider, MD  Omega-3 Fatty Acids (OMEGA-3 FISH OIL PO) Take by mouth.     Yes  Historical Provider, MD  OVER THE COUNTER MEDICATION Take 1 tablet by mouth 2 (two) times daily. BAYER BACK AND BODY WITH CAFFEINE1000-650   Yes Historical Provider, MD  pravastatin (PRAVACHOL) 40 MG tablet Take 40 mg by mouth daily.     Yes Historical Provider, MD  traZODone (DESYREL) 50 MG tablet Take 50 mg by mouth at bedtime.     Yes Historical Provider, MD    Family History  Family History  Problem Relation Age of Onset  . Colon cancer Father 38  . Heart disease Father   . Cancer Father     COLON  . Colon polyps Mother   . Heart disease Mother   . Breast cancer Paternal Grandmother   . COPD Sister   . Stomach cancer Neg Hx   . Heart disease Other     family history    Social History  History   Social History  . Marital Status: Divorced    Spouse Name: N/A  . Number of Children: 2  . Years of Education: N/A   Occupational History  . Retired    Social History Main Topics  . Smoking status: Former Smoker    Quit date: 12/19/1986  . Smokeless tobacco: Never Used  . Alcohol Use: Yes     Comment: ONCE OR TWICE A YEAR WILL HAVE WINE  . Drug Use: No  . Sexual Activity: Yes    Birth Control/ Protection: Surgical   Other Topics Concern  . Not on file   Social History Narrative     Review of Systems, as per HPI, otherwise negative General:  No chills, fever, night sweats or weight changes.  Cardiovascular:  No chest pain, dyspnea on exertion, edema, orthopnea, palpitations, paroxysmal nocturnal dyspnea. Dermatological: No rash, lesions/masses Respiratory: No cough, dyspnea Urologic: No hematuria, dysuria Abdominal:   No nausea, vomiting, diarrhea, bright red blood per rectum, melena, or hematemesis Neurologic:  No visual changes, wkns, changes in mental status. All other systems reviewed and are otherwise negative except as noted above.  Physical Exam  Blood pressure 108/60, pulse 75, height 5' 1.5" (1.562 m), weight 129 lb (58.514 kg).  General: Pleasant,  NAD Psych: Normal affect. Neuro: Alert and oriented X 3. Moves all extremities spontaneously. HEENT: Normal  Neck: Supple without bruits or JVD. Lungs:  Resp regular and unlabored, CTA. Heart: RRR no s3, s4, or murmurs. Abdomen: Soft, non-tender, non-distended, BS + x 4.  Extremities: No clubbing, cyanosis or edema. DP/PT/Radials 2+ and equal bilaterally.  Labs:  No results for input(s): CKTOTAL, CKMB, TROPONINI in the last 72 hours. Lab Results  Component Value Date   WBC 6.4 08/30/2014   HGB 13.3 08/30/2014   HCT 39.7 08/30/2014   MCV 92.5 08/30/2014   PLT 209.0 08/30/2014    No results found for: DDIMER Invalid input(s): POCBNP    Component Value Date/Time  NA 142 08/30/2014 1446   NA 140 09/19/2013   K 3.8 08/30/2014 1446   CL 106 08/30/2014 1446   CO2 27 08/30/2014 1446   GLUCOSE 86 08/30/2014 1446   BUN 17 08/30/2014 1446   BUN 11 09/19/2013   CREATININE 0.6 08/30/2014 1446   CREATININE 0.6 09/19/2013   CALCIUM 9.3 08/30/2014 1446   PROT 6.6 12/14/2013 1421   ALBUMIN 3.6 12/14/2013 1421   AST 24 12/14/2013 1421   ALT 16 12/14/2013 1421   ALKPHOS 50 12/14/2013 1421   BILITOT 0.9 12/14/2013 1421   Lab Results  Component Value Date   CHOL 187 12/14/2013   HDL 67.70 12/14/2013   LDLCALC 109* 12/14/2013   TRIG 53.0 12/14/2013   Accessory Clinical Findings  Echocardiogram - 10/27/2014 Baseline:  - LV size was normal. - LV global systolic function was normal. The estimated LV ejection fraction was 55%. - Normal wall motion; no LV regional wall motion abnormalities.  Peak stress:  - LV size was reduced appropriately. - LV global systolic function was vigorous. The estimated LV ejection fraction was 80%. - No evidence for new LV regional wall motion abnormalities. ------------------------------------------------------------------- Stress echo results:   Left ventricular ejection fraction was normal at rest and with stress. There was no  echocardiographic evidence for stress-induced ischemia.  ECG - normal sinus rhythm with nonspecific T-wave abnormalities.     Assessment & Plan  69 year old female  1. Dyspnea on exertion, fatigue with significant risk factors that include hyperlipidemia, significant family history of premature coronary artery disease, history of autoimmune disease that predisposes patient for early development of atherosclerosis. Her EKG shows nonspecific changes.  Normal TSH, we will check cortisol level. Patient stress echocardiogram was normal however suspicion for possible coronary artery disease is high considering her significant family history. We will order coronary CT that will answer multiple questions. First of all it will answer if she does have significant plaque. Also her LDL is 109 with goal under 100, however if CT shows that she has plaque, with her family history we will be aiming for under 70 and in that case we might start patient on PCSK-9 inhibitors considering the pravastatin doesn't control her lipids and she was intolerant to Lipitor and Crestor in the past.   2. Blood pressure - controlled  3. Hyperlipidemia - currently on pravastatin and omega-3 acids, in the past she didn't tolerate other statins such as Lipitor or Crestor. Further management as above.  Follow up after coronary CT.  Lars Masson, MD, [facility] 12/22/2014, 8:50 AM

## 2014-12-29 ENCOUNTER — Encounter: Payer: Self-pay | Admitting: Cardiology

## 2015-01-02 ENCOUNTER — Encounter: Payer: Self-pay | Admitting: Internal Medicine

## 2015-01-02 ENCOUNTER — Telehealth: Payer: Self-pay | Admitting: Cardiology

## 2015-01-02 NOTE — Telephone Encounter (Signed)
Charmaine, I have appealed and talk to insurance peer to peer physician, He took note for further review. Have you got any response from them? [place] Siren

## 2015-01-02 NOTE — Telephone Encounter (Signed)
New message        Pt is calling concerning her CTA   spoke with Charmaine because she asked to speak with billing but Charmaine says she needs to talk with Karlene Einstein or Meda Coffee

## 2015-01-03 NOTE — Telephone Encounter (Signed)
Received faxed denial:

## 2015-01-03 NOTE — Telephone Encounter (Signed)
Called patient back as Shannon Sutton requested.  She was confused as to why she was called with an appointment for her Cardiac CTA when she had a denial letter from [employer].   I advised her that Charmaine and Dr. Meda Coffee were working on an appeal, and that if we did not have an authorization we would let her know in time so she can either cancel or reschedule it since it is set up for the 7th. She was upset that the scheduler told her that her test was approved and I appologized that there had been confusion and she stated it was fine and that she was frustrated.  I assured her that Charmaine is great about following up with appeals and she would make sure she was kept in the loop of the status of her CTA.  She thanked me for my call back and I gave her my direct line incase she had any further questions.

## 2015-01-04 ENCOUNTER — Other Ambulatory Visit: Payer: Self-pay | Admitting: *Deleted

## 2015-01-04 MED ORDER — TRAZODONE HCL 50 MG PO TABS: 50.0000 mg | ORAL_TABLET | Freq: Every day | ORAL | Status: DC

## 2015-01-04 MED ORDER — LUBIPROSTONE 24 MCG PO CAPS: 24.0000 ug | ORAL_CAPSULE | ORAL | Status: DC | PRN

## 2015-01-04 NOTE — Telephone Encounter (Signed)
01-04-15 Fast Appeal faxed to [place], [phone].  [facility] denied 01-01-15.

## 2015-01-06 NOTE — Telephone Encounter (Signed)
We re- appealed for her CTA to be covered on Friday, we are awaiting response from her insurance.

## 2015-01-08 ENCOUNTER — Other Ambulatory Visit: Payer: Self-pay | Admitting: *Deleted

## 2015-01-08 MED ORDER — LUBIPROSTONE 24 MCG PO CAPS: 24.0000 ug | ORAL_CAPSULE | Freq: Two times a day (BID) | ORAL | Status: DC

## 2015-01-09 NOTE — Telephone Encounter (Signed)
  Message written by Pre-Cert/Billing dept to Dr Meda Coffee that:  Dr. Meda Coffee , Appeal was overturned and Cardiac CTA is approved!  Noted that the pt was previously scheduled for her CTA for 01/18/15 at 1100 with Dr Meda Coffee to do.  Sent [facility] to follow-up with the pt, in regards to this scheduled appt and give any hospital instructions and arrival instructions to the pt.  Pt is aware of approval as well.

## 2015-01-18 ENCOUNTER — Ambulatory Visit (HOSPITAL_COMMUNITY)
Admission: RE | Admit: 2015-01-18 | Discharge: 2015-01-18 | Disposition: A | Discharge status: Home / Self Care | Payer: Medicare HMO | Source: Ambulatory Visit | Attending: Cardiology | Admitting: Cardiology

## 2015-01-18 DIAGNOSIS — Z8249 Family history of ischemic heart disease and other diseases of the circulatory system: Secondary | ICD-10-CM | POA: Insufficient documentation

## 2015-01-18 DIAGNOSIS — E785 Hyperlipidemia, unspecified: Secondary | ICD-10-CM | POA: Diagnosis present

## 2015-01-18 DIAGNOSIS — R5382 Chronic fatigue, unspecified: Secondary | ICD-10-CM | POA: Diagnosis not present

## 2015-01-18 DIAGNOSIS — R079 Chest pain, unspecified: Secondary | ICD-10-CM | POA: Diagnosis not present

## 2015-01-18 DIAGNOSIS — I2510 Atherosclerotic heart disease of native coronary artery without angina pectoris: Secondary | ICD-10-CM | POA: Diagnosis not present

## 2015-01-18 MED ORDER — IOHEXOL 350 MG/ML SOLN
80.0000 mL | Freq: Once | INTRAVENOUS | Status: AC | PRN
  Administered 2015-01-18: 80 mL via INTRAVENOUS

## 2015-01-18 MED ORDER — METOPROLOL TARTRATE 1 MG/ML IV SOLN
2.5000 mg | Freq: Once | INTRAVENOUS | Status: AC
  Administered 2015-01-18: 2.5 mg via INTRAVENOUS
  Filled 2015-01-18: qty 5

## 2015-01-18 MED ORDER — NITROGLYCERIN 0.4 MG SL SUBL
0.4000 mg | SUBLINGUAL_TABLET | SUBLINGUAL | Status: DC | PRN
  Administered 2015-01-18: 0.4 mg via SUBLINGUAL
  Filled 2015-01-18: qty 25

## 2015-01-18 MED ORDER — NITROGLYCERIN 0.4 MG SL SUBL
SUBLINGUAL_TABLET | SUBLINGUAL | Status: AC
  Filled 2015-01-18: qty 1

## 2015-01-18 MED ORDER — METOPROLOL TARTRATE 1 MG/ML IV SOLN
INTRAVENOUS | Status: AC
Start: 2015-01-18 — End: 2015-01-18
  Filled 2015-01-18: qty 5

## 2015-01-25 ENCOUNTER — Ambulatory Visit (INDEPENDENT_AMBULATORY_CARE_PROVIDER_SITE_OTHER): Payer: Medicare HMO | Admitting: Pharmacist

## 2015-01-25 DIAGNOSIS — E7800 Pure hypercholesterolemia, unspecified: Secondary | ICD-10-CM

## 2015-01-25 LAB — LIPID PANEL
Cholesterol: 184 mg/dL (ref 0–200)
HDL: 65.3 mg/dL (ref >39.00)
LDL Cholesterol: 109 mg/dL — ABNORMAL HIGH (ref 0–99)
NonHDL: 118.7
Total CHOL/HDL Ratio: 3
Triglycerides: 48 mg/dL (ref 0.0–149.0)
VLDL: 9.6 mg/dL (ref 0.0–40.0)

## 2015-01-25 LAB — HEPATIC FUNCTION PANEL
ALT: 12 U/L (ref 0–35)
AST: 16 U/L (ref 0–37)
Albumin: 3.6 g/dL (ref 3.5–5.2)
Alkaline Phosphatase: 56 U/L (ref 39–117)
Bilirubin, Direct: 0.1 mg/dL (ref 0.0–0.3)
Total Bilirubin: 0.3 mg/dL (ref 0.2–1.2)
Total Protein: 6.7 g/dL (ref 6.0–8.3)

## 2015-01-26 MED ORDER — ROSUVASTATIN CALCIUM 20 MG PO TABS: 20.0000 mg | ORAL_TABLET | Freq: Every day | ORAL | Status: DC

## 2015-01-26 NOTE — Progress Notes (Signed)
   HPI  [name] is a 69 yo pt of Dr. Meda Coffee.  She was referred to the Lipid clinic after she had a cardiac CT that showed diffuse disease with a coronary calcium score in th 65th percentile for age and sex.  She has a very strong family history of CAD including her mother who died of a heart attack at 20 and her father who had his first MI in his 84s.  She has been treated for her cholesterol for many years.  She is currently taking pravastatin 40mg  once daily.  She cannot remember anything else she has ever tried- Dr. Francesca Oman note mentioned Lipitor and Crestor- but those did not sound familiar to the patient.  If she did have problems, she thinks it was likely due to eosinophilic fascitis that was undiagnosed until 2009.    RF: family history, age, elevated calcium score Goals: LDL <100 mg/dL (<70 optimal), non-HDL <130 Current Meds- pravastatin 40mg  daily Intolerances- ? lipitor and Crestor   Labs:  01/2015- TC 184, TG 48, LDL 109, HDL 65, LFTS normal   Current Outpatient Prescriptions  Medication Sig Dispense Refill  . ALPRAZolam (XANAX) 0.5 MG tablet Take 0.5 mg by mouth at bedtime as needed for anxiety.    . B Complex-C (SUPER B COMPLEX) TABS Take 1 tablet by mouth daily.     Marland Kitchen CALCIUM-MAGNESIUM-ZINC PO Take 1 capsule by mouth daily.     . Cholecalciferol (VITAMIN D3) 2000 UNITS TABS Take 1 tablet by mouth daily.      . clonazePAM (KLONOPIN) 0.5 MG tablet TAKE ONE TO TWO TABLETS BY MOUTH AT BEDTIME AS NEEDED 60 tablet 1  . FLUoxetine (PROZAC) 40 MG capsule Take 1 capsule (40 mg total) by mouth daily. 90 capsule 3  . folic acid (FOLVITE) 678 MCG tablet Take 400 mcg by mouth daily.    Marland Kitchen lubiprostone (AMITIZA) 24 MCG capsule Take 1 capsule (24 mcg total) by mouth 2 (two) times daily with a meal. 180 capsule 3  . Multiple Vitamins-Minerals (MULTIVITAMIN WITH MINERALS) tablet Take 1 tablet by mouth daily.      . Omega-3 Fatty Acids (OMEGA-3 FISH OIL PO) Take by mouth.      Marland Kitchen OVER THE  COUNTER MEDICATION Take 1 tablet by mouth 2 (two) times daily. BAYER BACK AND BODY WITH CAFFEINE1000-650    . pravastatin (PRAVACHOL) 40 MG tablet Take 1 tablet (40 mg total) by mouth daily. 90 tablet 3  . traZODone (DESYREL) 50 MG tablet Take 1 tablet (50 mg total) by mouth at bedtime. 90 tablet 3   No current facility-administered medications for this visit.    Allergies  Allergen Reactions  . Morphine And Related Itching    agitation  . Promethazine-Codeine Itching    Itching in face   Assessment and Plan  1.  Hyperlipidemia- Pt's LDL slightly above goal.  Discussed 3 different options with patient.  We could increase pravastatin to 80mg  daily, add Zetia 10mg  to the pravastatin 40mg  daily or change to a more potent statin such as Crestor.  At this time, would prefer changing to a more potent statin as increasing dose of pravastatin will likely not allow [place] to reach goal of <70 and do not want to increase pill burden by adding another therapy.  Pt is agreeable to this plan.   Will start her at 20mg  of Crestor daily and plan to recheck labs in 3 months.

## 2015-02-19 ENCOUNTER — Encounter: Payer: Self-pay | Admitting: Internal Medicine

## 2015-02-19 ENCOUNTER — Encounter: Payer: Self-pay | Admitting: Gastroenterology

## 2015-04-03 ENCOUNTER — Telehealth: Payer: Self-pay

## 2015-04-03 NOTE — Telephone Encounter (Signed)
To call to educate on AWV; the patient has been placed on crestor to lower LDL Discussed diet ; plant fat and limit of saturated fat; Exercise; etc but that being said, may be getting the education she needs elsewhere; Also noted that other goals to document preventive plan long term, etc, but Dr. Alain Marion can continue to do the AWV with CPE; educated on possible copay for CPE if problems or other issues found and referred back to insurer.  Patient to call and schedule if she feels the AWV may be of benefit to her.

## 2015-04-27 ENCOUNTER — Telehealth: Payer: Self-pay | Admitting: *Deleted

## 2015-04-27 ENCOUNTER — Other Ambulatory Visit (INDEPENDENT_AMBULATORY_CARE_PROVIDER_SITE_OTHER): Payer: Medicare HMO | Admitting: *Deleted

## 2015-04-27 DIAGNOSIS — E7800 Pure hypercholesterolemia, unspecified: Secondary | ICD-10-CM

## 2015-04-27 LAB — HEPATIC FUNCTION PANEL
ALT: 14 U/L (ref 0–35)
AST: 17 U/L (ref 0–37)
Albumin: 4.2 g/dL (ref 3.5–5.2)
Alkaline Phosphatase: 50 U/L (ref 39–117)
Bilirubin, Direct: 0.1 mg/dL (ref 0.0–0.3)
Total Bilirubin: 0.4 mg/dL (ref 0.2–1.2)
Total Protein: 6.8 g/dL (ref 6.0–8.3)

## 2015-04-27 LAB — LIPID PANEL
Cholesterol: 157 mg/dL (ref 0–200)
HDL: 69.8 mg/dL (ref >39.00)
LDL Cholesterol: 73 mg/dL (ref 0–99)
NonHDL: 87.2
Total CHOL/HDL Ratio: 2
Triglycerides: 73 mg/dL (ref 0.0–149.0)
VLDL: 14.6 mg/dL (ref 0.0–40.0)

## 2015-04-27 MED ORDER — ROSUVASTATIN CALCIUM 20 MG PO TABS: 20.0000 mg | ORAL_TABLET | Freq: Every day | ORAL | Status: DC

## 2015-04-27 NOTE — Addendum Note (Signed)
Addended by: Eulis Foster on: 04/27/2015 08:02 AM   Modules accepted: Orders

## 2015-04-27 NOTE — Telephone Encounter (Signed)
-----   Message from Dorothy Spark, MD sent at 04/27/2015  3:59 PM EDT ----- Excellent response to crestor with significant LDL decrease, now at goal.

## 2015-04-27 NOTE — Telephone Encounter (Signed)
Informed the pt that per Dr Meda Coffee her labs showed excellent response to crestor with significant LDL decrease, now at goal.  Pt requesting a refill of this med sent to her pharmacy of choice and say in the comment section to for pharmacy to fill this medication as a generic when available.  Confirmed the pharmacy of choice with the pt.  Pt verbalized understanding and pleased with these results.

## 2015-05-01 ENCOUNTER — Other Ambulatory Visit: Payer: Self-pay | Admitting: Internal Medicine

## 2015-05-01 DIAGNOSIS — Z1231 Encounter for screening mammogram for malignant neoplasm of breast: Secondary | ICD-10-CM

## 2015-05-04 ENCOUNTER — Ambulatory Visit (HOSPITAL_COMMUNITY)
Admission: RE | Admit: 2015-05-04 | Discharge: 2015-05-04 | Disposition: A | Discharge status: Home / Self Care | Payer: Medicare HMO | Source: Ambulatory Visit | Attending: Internal Medicine | Admitting: Internal Medicine

## 2015-05-04 DIAGNOSIS — Z1231 Encounter for screening mammogram for malignant neoplasm of breast: Secondary | ICD-10-CM | POA: Diagnosis not present

## 2015-05-29 ENCOUNTER — Other Ambulatory Visit: Payer: Self-pay | Admitting: Internal Medicine

## 2015-05-30 NOTE — Telephone Encounter (Signed)
Please advise, thanks.

## 2015-05-31 ENCOUNTER — Other Ambulatory Visit: Payer: Self-pay

## 2015-05-31 NOTE — Telephone Encounter (Signed)
Pt needs OV Thx

## 2015-06-02 ENCOUNTER — Other Ambulatory Visit: Payer: Self-pay | Admitting: Internal Medicine

## 2015-06-04 ENCOUNTER — Other Ambulatory Visit: Payer: Self-pay | Admitting: Internal Medicine

## 2015-06-04 NOTE — Telephone Encounter (Signed)
Received call from pt stating she has been trying to get refill on her klonopin. Pharmacy told her med has been denied, but she states she saw on mychart med was approved. Inform pt md did approve med, but it hasn't been called to [facility]. See previous request from 05/29/15. Inform pt will contact [facility] to fill.../lmb  Called [facility] spoke with ben/pharmacist gave md approval.../lmb

## 2015-06-04 NOTE — Telephone Encounter (Signed)
Duplicate msg refill already called to [facility] & pt is aware...Shannon Sutton

## 2015-06-07 ENCOUNTER — Encounter: Payer: Self-pay | Admitting: Internal Medicine

## 2015-07-06 ENCOUNTER — Other Ambulatory Visit (INDEPENDENT_AMBULATORY_CARE_PROVIDER_SITE_OTHER): Payer: Medicare HMO

## 2015-07-06 ENCOUNTER — Encounter: Payer: Self-pay | Admitting: Internal Medicine

## 2015-07-06 ENCOUNTER — Ambulatory Visit (INDEPENDENT_AMBULATORY_CARE_PROVIDER_SITE_OTHER): Payer: Medicare HMO | Admitting: Internal Medicine

## 2015-07-06 VITALS — BP 138/80 | HR 66 | Wt 127.0 lb

## 2015-07-06 DIAGNOSIS — M354 Diffuse (eosinophilic) fasciitis: Secondary | ICD-10-CM

## 2015-07-06 DIAGNOSIS — G2581 Restless legs syndrome: Secondary | ICD-10-CM

## 2015-07-06 DIAGNOSIS — D7218 Eosinophilia in diseases classified elsewhere: Secondary | ICD-10-CM

## 2015-07-06 DIAGNOSIS — E7800 Pure hypercholesterolemia, unspecified: Secondary | ICD-10-CM

## 2015-07-06 DIAGNOSIS — F329 Major depressive disorder, single episode, unspecified: Secondary | ICD-10-CM

## 2015-07-06 DIAGNOSIS — Z23 Encounter for immunization: Secondary | ICD-10-CM | POA: Diagnosis not present

## 2015-07-06 DIAGNOSIS — F32A Depression, unspecified: Secondary | ICD-10-CM

## 2015-07-06 LAB — SEDIMENTATION RATE: SED RATE: 7 mm/h (ref 0–22)

## 2015-07-06 LAB — URIC ACID: URIC ACID, SERUM: 3.9 mg/dL (ref 2.4–7.0)

## 2015-07-06 MED ORDER — CLONAZEPAM 0.5 MG PO TABS: 0.5000 mg | ORAL_TABLET | Freq: Every evening | ORAL | Status: DC | PRN

## 2015-07-06 MED ORDER — DULOXETINE HCL 60 MG PO CPEP: 60.0000 mg | ORAL_CAPSULE | Freq: Every day | ORAL | Status: DC

## 2015-07-06 MED ORDER — DICLOFENAC SODIUM 75 MG PO TBEC: 75.0000 mg | DELAYED_RELEASE_TABLET | Freq: Two times a day (BID) | ORAL | Status: DC

## 2015-07-06 NOTE — Assessment & Plan Note (Signed)
Reduce Crestor if achy

## 2015-07-06 NOTE — Progress Notes (Signed)
Pre visit review using our clinic review tool, if applicable. No additional management support is needed unless otherwise documented below in the visit note. 

## 2015-07-06 NOTE — Assessment & Plan Note (Signed)
ESR, CK

## 2015-07-06 NOTE — Assessment & Plan Note (Signed)
Worse D/c Fluoxetine. Start Cymbalta

## 2015-07-06 NOTE — Progress Notes (Signed)
Subjective:  Patient ID: Shannon Sutton, female    DOB: July 03, 1946  Age: 69 y.o. MRN: [id number]  CC: No chief complaint on file.   HPI   [name] presents for RLS C/o LBP - taking a lot ASA+caffeine. Pt saw Dr Phoebe Perch. C/o stress at home  Outpatient Prescriptions Prior to Visit  Medication Sig Dispense Refill  . B Complex-C (SUPER B COMPLEX) TABS Take 1 tablet by mouth daily.     Marland Kitchen CALCIUM-MAGNESIUM-ZINC PO Take 1 capsule by mouth daily.     . Cholecalciferol (VITAMIN D3) 2000 UNITS TABS Take 1 tablet by mouth daily.      . folic acid (FOLVITE) 800 MCG tablet Take 400 mcg by mouth daily.    Marland Kitchen lubiprostone (AMITIZA) 24 MCG capsule Take 1 capsule (24 mcg total) by mouth 2 (two) times daily with a meal. 180 capsule 3  . Multiple Vitamins-Minerals (MULTIVITAMIN WITH MINERALS) tablet Take 1 tablet by mouth daily.      . Omega-3 Fatty Acids (OMEGA-3 FISH OIL PO) Take by mouth.      Marland Kitchen OVER THE COUNTER MEDICATION Take 1 tablet by mouth 2 (two) times daily. BAYER BACK AND BODY WITH [id number]    . rosuvastatin (CRESTOR) 20 MG tablet Take 1 tablet (20 mg total) by mouth daily. 30 tablet 3  . traZODone (DESYREL) 50 MG tablet Take 1 tablet (50 mg total) by mouth at bedtime. 90 tablet 3  . ALPRAZolam (XANAX) 0.5 MG tablet Take 0.5 mg by mouth at bedtime as needed for anxiety.    . clonazePAM (KLONOPIN) 0.5 MG tablet TAKE ONE TO TWO TABLETS BY MOUTH AT BEDTIME AS NEEDED 30 tablet 0  . FLUoxetine (PROZAC) 40 MG capsule Take 1 capsule (40 mg total) by mouth daily. 90 capsule 3  . pravastatin (PRAVACHOL) 40 MG tablet Take 1 tablet (40 mg total) by mouth daily. (Patient not taking: Reported on 07/06/2015) 90 tablet 3   No facility-administered medications prior to visit.    ROS Review of Systems  Constitutional: Negative for chills, activity change, appetite change, fatigue and unexpected weight change.  HENT: Negative for congestion, mouth sores and sinus pressure.   Eyes: Negative for  visual disturbance.  Respiratory: Negative for cough and chest tightness.   Gastrointestinal: Negative for nausea and abdominal pain.  Genitourinary: Negative for frequency, difficulty urinating and vaginal pain.  Musculoskeletal: Positive for myalgias, back pain and arthralgias. Negative for gait problem and neck stiffness.  Skin: Negative for pallor and rash.  Neurological: Negative for dizziness, tremors, weakness, numbness and headaches.  Psychiatric/Behavioral: Negative for confusion and sleep disturbance. The patient is nervous/anxious.     Objective:  BP 138/80 mmHg  Pulse 66  Wt 127 lb (57.607 kg)  SpO2 99%  BP Readings from Last 3 Encounters:  07/06/15 138/80  01/18/15 106/66  12/22/14 108/60    Wt Readings from Last 3 Encounters:  07/06/15 127 lb (57.607 kg)  12/22/14 129 lb (58.514 kg)  10/18/14 132 lb 1.9 oz (59.929 kg)    Physical Exam  Constitutional: She appears well-developed. No distress.  HENT:  Head: Normocephalic.  Right Ear: External ear normal.  Left Ear: External ear normal.  Nose: Nose normal.  Mouth/Throat: Oropharynx is clear and moist.  Eyes: Conjunctivae are normal. Pupils are equal, round, and reactive to light. Right eye exhibits no discharge. Left eye exhibits no discharge.  Neck: Normal range of motion. Neck supple. No JVD present. No tracheal deviation present. No thyromegaly present.  Cardiovascular: Normal rate, regular rhythm and normal heart sounds.   Pulmonary/Chest: No stridor. No respiratory distress. She has no wheezes.  Abdominal: Soft. Bowel sounds are normal. She exhibits no distension and no mass. There is no tenderness. There is no rebound and no guarding.  Musculoskeletal: She exhibits tenderness. She exhibits no edema.  Lymphadenopathy:    She has no cervical adenopathy.  Neurological: She displays normal reflexes. No cranial nerve deficit. She exhibits normal muscle tone. Coordination normal.  Skin: No rash noted. No  erythema.  Psychiatric: She has a normal mood and affect. Her behavior is normal. Judgment and thought content normal.    Lab Results  Component Value Date   WBC 6.4 08/30/2014   HGB 13.3 08/30/2014   HCT 39.7 08/30/2014   PLT 209.0 08/30/2014   GLUCOSE 86 08/30/2014   CHOL 157 04/27/2015   TRIG 73.0 04/27/2015   HDL 69.80 04/27/2015   LDLCALC 73 04/27/2015   ALT 14 04/27/2015   AST 17 04/27/2015   NA 142 08/30/2014   K 3.8 08/30/2014   CL 106 08/30/2014   CREATININE 0.6 08/30/2014   BUN 17 08/30/2014   CO2 27 08/30/2014   TSH 1.62 08/30/2014    Mm Screening Breast Tomo Bilateral  05/04/2015   CLINICAL DATA:  Screening.  EXAM: DIGITAL SCREENING BILATERAL MAMMOGRAM WITH 3D TOMO WITH CAD  COMPARISON:  Previous exam(s).  ACR Breast Density Category c: The breast tissue is heterogeneously dense, which may obscure small masses.  FINDINGS: There are no findings suspicious for malignancy. Images were processed with CAD.  IMPRESSION: No mammographic evidence of malignancy. A result letter of this screening mammogram will be mailed directly to the patient.  RECOMMENDATION: Screening mammogram in one year. (Code:SM-B-01Y)  BI-RADS CATEGORY  1: Negative.   Electronically Signed   By: Annia Belt M.D.   On: 05/04/2015 20:37    Assessment & Plan:   Diagnoses and all orders for this visit:  Eosinophilic fasciitis -     TSH; Future -     CK; Future -     Vitamin B12; Future -     Vit D  25 hydroxy (rtn osteoporosis monitoring); Future -     Basic metabolic panel; Future -     Sedimentation rate; Future -     Urinalysis; Future -     Uric acid; Future -     Rheumatoid factor; Future  Depression -     TSH; Future -     CK; Future -     Vitamin B12; Future -     Vit D  25 hydroxy (rtn osteoporosis monitoring); Future -     Basic metabolic panel; Future -     Sedimentation rate; Future -     Urinalysis; Future -     Uric acid; Future -     Rheumatoid factor;  Future  Hypercholesterolemia -     TSH; Future -     CK; Future -     Vitamin B12; Future -     Vit D  25 hydroxy (rtn osteoporosis monitoring); Future -     Basic metabolic panel; Future -     Sedimentation rate; Future -     Urinalysis; Future -     Uric acid; Future -     Rheumatoid factor; Future  Restless leg syndrome -     TSH; Future -     CK; Future -     Vitamin B12; Future -  Vit D  25 hydroxy (rtn osteoporosis monitoring); Future -     Basic metabolic panel; Future -     Sedimentation rate; Future -     Urinalysis; Future -     Uric acid; Future -     Rheumatoid factor; Future  Need for influenza vaccination -     Flu vaccine HIGH DOSE PF  Other orders -     clonazePAM (KLONOPIN) 0.5 MG tablet; Take 1-2 tablets (0.5-1 mg total) by mouth at bedtime as needed. -     diclofenac (VOLTAREN) 75 MG EC tablet; Take 1 tablet (75 mg total) by mouth 2 (two) times daily. -     DULoxetine (CYMBALTA) 60 MG capsule; Take 1 capsule (60 mg total) by mouth daily.  I have discontinued Ms. Strassman's ALPRAZolam, pravastatin, and FLUoxetine. I have also changed her clonazePAM. Additionally, I am having her start on diclofenac and DULoxetine. Lastly, I am having her maintain her Vitamin D3, SUPER B COMPLEX, multivitamin with minerals, Omega-3 Fatty Acids (OMEGA-3 FISH OIL PO), CALCIUM-MAGNESIUM-ZINC PO, folic acid, OVER THE COUNTER MEDICATION, traZODone, lubiprostone, and rosuvastatin.  Meds ordered this encounter  Medications  . clonazePAM (KLONOPIN) 0.5 MG tablet    Sig: Take 1-2 tablets (0.5-1 mg total) by mouth at bedtime as needed.    Dispense:  60 tablet    Refill:  5  . diclofenac (VOLTAREN) 75 MG EC tablet    Sig: Take 1 tablet (75 mg total) by mouth 2 (two) times daily.    Dispense:  30 tablet    Refill:  0  . DULoxetine (CYMBALTA) 60 MG capsule    Sig: Take 1 capsule (60 mg total) by mouth daily.    Dispense:  30 capsule    Refill:  5     Follow-up: Return in about  3 months (around 10/05/2015) for a follow-up visit.  Sonda Primes, MD

## 2015-07-27 DIAGNOSIS — Z0100 Encounter for examination of eyes and vision without abnormal findings: Secondary | ICD-10-CM | POA: Diagnosis not present

## 2015-07-27 DIAGNOSIS — H524 Presbyopia: Secondary | ICD-10-CM | POA: Diagnosis not present

## 2015-09-12 ENCOUNTER — Other Ambulatory Visit (INDEPENDENT_AMBULATORY_CARE_PROVIDER_SITE_OTHER): Payer: Medicare HMO

## 2015-09-12 ENCOUNTER — Encounter: Payer: Self-pay | Admitting: Internal Medicine

## 2015-09-12 ENCOUNTER — Ambulatory Visit (INDEPENDENT_AMBULATORY_CARE_PROVIDER_SITE_OTHER): Payer: Medicare HMO | Admitting: Internal Medicine

## 2015-09-12 VITALS — BP 134/74 | HR 80 | Temp 98.7°F | Wt 127.0 lb

## 2015-09-12 DIAGNOSIS — F32A Depression, unspecified: Secondary | ICD-10-CM

## 2015-09-12 DIAGNOSIS — M354 Diffuse (eosinophilic) fasciitis: Secondary | ICD-10-CM

## 2015-09-12 DIAGNOSIS — R1010 Upper abdominal pain, unspecified: Secondary | ICD-10-CM

## 2015-09-12 DIAGNOSIS — G2581 Restless legs syndrome: Secondary | ICD-10-CM

## 2015-09-12 DIAGNOSIS — D7218 Eosinophilia in diseases classified elsewhere: Secondary | ICD-10-CM

## 2015-09-12 DIAGNOSIS — F329 Major depressive disorder, single episode, unspecified: Secondary | ICD-10-CM

## 2015-09-12 DIAGNOSIS — R69 Illness, unspecified: Secondary | ICD-10-CM | POA: Diagnosis not present

## 2015-09-12 DIAGNOSIS — E7800 Pure hypercholesterolemia, unspecified: Secondary | ICD-10-CM

## 2015-09-12 LAB — CBC WITH DIFFERENTIAL/PLATELET
BASOS ABS: 0.1 10*3/uL (ref 0.0–0.1)
Basophils Relative: 1 % (ref 0.0–3.0)
EOS ABS: 0.2 10*3/uL (ref 0.0–0.7)
Eosinophils Relative: 3.2 % (ref 0.0–5.0)
HCT: 41.2 % (ref 36.0–46.0)
Hemoglobin: 13.9 g/dL (ref 12.0–15.0)
LYMPHS ABS: 2 10*3/uL (ref 0.7–4.0)
Lymphocytes Relative: 38.3 % (ref 12.0–46.0)
MCHC: 33.7 g/dL (ref 30.0–36.0)
MCV: 92.8 fl (ref 78.0–100.0)
MONOS PCT: 8.1 % (ref 3.0–12.0)
Monocytes Absolute: 0.4 10*3/uL (ref 0.1–1.0)
NEUTROS ABS: 2.6 10*3/uL (ref 1.4–7.7)
NEUTROS PCT: 49.4 % (ref 43.0–77.0)
PLATELETS: 221 10*3/uL (ref 150.0–400.0)
RBC: 4.44 Mil/uL (ref 3.87–5.11)
RDW: 13.4 % (ref 11.5–15.5)
WBC: 5.2 10*3/uL (ref 4.0–10.5)

## 2015-09-12 LAB — HEPATIC FUNCTION PANEL
ALBUMIN: 4.3 g/dL (ref 3.5–5.2)
ALK PHOS: 51 U/L (ref 39–117)
ALT: 15 U/L (ref 0–35)
AST: 18 U/L (ref 0–37)
BILIRUBIN DIRECT: 0.1 mg/dL (ref 0.0–0.3)
BILIRUBIN TOTAL: 0.4 mg/dL (ref 0.2–1.2)
TOTAL PROTEIN: 7.2 g/dL (ref 6.0–8.3)

## 2015-09-12 LAB — SEDIMENTATION RATE: SED RATE: 6 mm/h (ref 0–22)

## 2015-09-12 LAB — URINALYSIS
BILIRUBIN URINE: NEGATIVE
Hgb urine dipstick: NEGATIVE
KETONES UR: NEGATIVE
LEUKOCYTES UA: NEGATIVE
Nitrite: NEGATIVE
PH: 5.5 (ref 5.0–8.0)
Specific Gravity, Urine: 1.01 (ref 1.000–1.030)
Total Protein, Urine: NEGATIVE
URINE GLUCOSE: NEGATIVE
UROBILINOGEN UA: 0.2 (ref 0.0–1.0)

## 2015-09-12 LAB — BASIC METABOLIC PANEL
BUN: 20 mg/dL (ref 6–23)
CALCIUM: 9.5 mg/dL (ref 8.4–10.5)
CO2: 29 meq/L (ref 19–32)
CREATININE: 0.6 mg/dL (ref 0.40–1.20)
Chloride: 104 mEq/L (ref 96–112)
GFR: 104.55 mL/min (ref >60.00)
GLUCOSE: 92 mg/dL (ref 70–99)
Potassium: 4.3 mEq/L (ref 3.5–5.1)
SODIUM: 141 meq/L (ref 135–145)

## 2015-09-12 LAB — VITAMIN B12: Vitamin B-12: 647 pg/mL (ref 211–911)

## 2015-09-12 LAB — TSH: TSH: 3.28 u[IU]/mL (ref 0.35–4.50)

## 2015-09-12 LAB — RHEUMATOID FACTOR

## 2015-09-12 LAB — CK: Total CK: 83 U/L (ref 7–177)

## 2015-09-12 LAB — VITAMIN D 25 HYDROXY (VIT D DEFICIENCY, FRACTURES): VITD: 33.5 ng/mL (ref 30.00–100.00)

## 2015-09-12 MED ORDER — ONDANSETRON HCL 4 MG PO TABS: 4.0000 mg | ORAL_TABLET | Freq: Three times a day (TID) | ORAL | Status: DC | PRN

## 2015-09-12 MED ORDER — ESOMEPRAZOLE MAGNESIUM 20 MG PO PACK: 20.0000 mg | PACK | Freq: Every day | ORAL | Status: DC

## 2015-09-12 NOTE — Progress Notes (Signed)
Subjective:  Patient ID: Shannon Sutton, female    DOB: September 15, 1946  Age: 69 y.o. MRN: [id number]  CC: No chief complaint on file.   HPI ALMETIA NOP presents for abd bloating daily w/pain, nausea, indigestion x3 mo. C/o chronic constipation   Outpatient Prescriptions Prior to Visit  Medication Sig Dispense Refill  . B Complex-C (SUPER B COMPLEX) TABS Take 1 tablet by mouth daily.     Marland Kitchen CALCIUM-MAGNESIUM-ZINC PO Take 1 capsule by mouth daily.     . Cholecalciferol (VITAMIN D3) 2000 UNITS TABS Take 1 tablet by mouth daily.      . clonazePAM (KLONOPIN) 0.5 MG tablet Take 1-2 tablets (0.5-1 mg total) by mouth at bedtime as needed. 60 tablet 5  . diclofenac (VOLTAREN) 75 MG EC tablet Take 1 tablet (75 mg total) by mouth 2 (two) times daily. 30 tablet 0  . DULoxetine (CYMBALTA) 60 MG capsule Take 1 capsule (60 mg total) by mouth daily. 30 capsule 5  . folic acid (FOLVITE) 800 MCG tablet Take 400 mcg by mouth daily.    Marland Kitchen lubiprostone (AMITIZA) 24 MCG capsule Take 1 capsule (24 mcg total) by mouth 2 (two) times daily with a meal. 180 capsule 3  . Multiple Vitamins-Minerals (MULTIVITAMIN WITH MINERALS) tablet Take 1 tablet by mouth daily.      . Omega-3 Fatty Acids (OMEGA-3 FISH OIL PO) Take by mouth.      Marland Kitchen OVER THE COUNTER MEDICATION Take 1 tablet by mouth 2 (two) times daily. BAYER BACK AND BODY WITH CAFFEINE1000-650    . rosuvastatin (CRESTOR) 20 MG tablet Take 1 tablet (20 mg total) by mouth daily. 30 tablet 3  . traZODone (DESYREL) 50 MG tablet Take 1 tablet (50 mg total) by mouth at bedtime. 90 tablet 3   No facility-administered medications prior to visit.    ROS Review of Systems  Constitutional: Negative for fever, chills, activity change, appetite change, fatigue and unexpected weight change.  HENT: Negative for congestion, mouth sores and sinus pressure.   Eyes: Negative for visual disturbance.  Respiratory: Negative for cough and chest tightness.   Gastrointestinal: Positive  for nausea, abdominal pain and abdominal distention. Negative for vomiting, blood in stool and rectal pain.  Genitourinary: Positive for urgency. Negative for dysuria, frequency, difficulty urinating and vaginal pain.  Musculoskeletal: Negative for back pain and gait problem.  Skin: Negative for pallor and rash.  Neurological: Negative for dizziness, tremors, weakness, numbness and headaches.  Psychiatric/Behavioral: Negative for confusion and sleep disturbance.    Objective:  BP 134/74 mmHg  Pulse 80  Temp(Src) 98.7 F (37.1 C) (Oral)  Wt 127 lb (57.607 kg)  SpO2 98%  BP Readings from Last 3 Encounters:  09/12/15 134/74  07/06/15 138/80  01/18/15 106/66    Wt Readings from Last 3 Encounters:  09/12/15 127 lb (57.607 kg)  07/06/15 127 lb (57.607 kg)  12/22/14 129 lb (58.514 kg)    Physical Exam  Constitutional: She appears well-developed. No distress.  HENT:  Head: Normocephalic.  Right Ear: External ear normal.  Left Ear: External ear normal.  Nose: Nose normal.  Mouth/Throat: Oropharynx is clear and moist.  Eyes: Conjunctivae are normal. Pupils are equal, round, and reactive to light. Right eye exhibits no discharge. Left eye exhibits no discharge.  Neck: Normal range of motion. Neck supple. No JVD present. No tracheal deviation present. No thyromegaly present.  Cardiovascular: Normal rate, regular rhythm and normal heart sounds.   Pulmonary/Chest: No stridor. No respiratory distress.  She has no wheezes.  Abdominal: Soft. Bowel sounds are normal. She exhibits no distension and no mass. There is tenderness. There is no rebound and no guarding.  Musculoskeletal: She exhibits no edema or tenderness.  Lymphadenopathy:    She has no cervical adenopathy.  Neurological: She displays normal reflexes. No cranial nerve deficit. She exhibits normal muscle tone. Coordination normal.  Skin: No rash noted. No erythema.  Psychiatric: She has a normal mood and affect. Her behavior is  normal. Judgment and thought content normal.  epig area is sensitive  No mass  Lab Results  Component Value Date   WBC 5.2 09/12/2015   HGB 13.9 09/12/2015   HCT 41.2 09/12/2015   PLT 221.0 09/12/2015   GLUCOSE 92 09/12/2015   CHOL 157 04/27/2015   TRIG 73.0 04/27/2015   HDL 69.80 04/27/2015   LDLCALC 73 04/27/2015   ALT 15 09/12/2015   AST 18 09/12/2015   NA 141 09/12/2015   K 4.3 09/12/2015   CL 104 09/12/2015   CREATININE 0.60 09/12/2015   BUN 20 09/12/2015   CO2 29 09/12/2015   TSH 3.28 09/12/2015    Mm Screening Breast Tomo Bilateral  05/04/2015  CLINICAL DATA:  Screening. EXAM: DIGITAL SCREENING BILATERAL MAMMOGRAM WITH 3D TOMO WITH CAD COMPARISON:  Previous exam(s). ACR Breast Density Category c: The breast tissue is heterogeneously dense, which may obscure small masses. FINDINGS: There are no findings suspicious for malignancy. Images were processed with CAD. IMPRESSION: No mammographic evidence of malignancy. A result letter of this screening mammogram will be mailed directly to the patient. RECOMMENDATION: Screening mammogram in one year. (Code:SM-B-01Y) BI-RADS CATEGORY  1: Negative. Electronically Signed   By: Annia Belt M.D.   On: 05/04/2015 20:37    Assessment & Plan:   Diagnoses and all orders for this visit:  Hypercholesterolemia -     Urinalysis; Future -     Sedimentation rate; Future -     Basic metabolic panel; Future -     CBC with Differential/Platelet; Future -     Hepatic function panel; Future  Pain of upper abdomen -     Urinalysis; Future -     Sedimentation rate; Future -     Basic metabolic panel; Future -     CBC with Differential/Platelet; Future -     Hepatic function panel; Future  Other orders -     ondansetron (ZOFRAN) 4 MG tablet; Take 1 tablet (4 mg total) by mouth every 8 (eight) hours as needed for nausea or vomiting. -     esomeprazole (NEXIUM) 20 MG packet; Take 20 mg by mouth daily before breakfast.  I am having Ms. Car  start on ondansetron and esomeprazole. I am also having her maintain her Vitamin D3, SUPER B COMPLEX, multivitamin with minerals, Omega-3 Fatty Acids (OMEGA-3 FISH OIL PO), CALCIUM-MAGNESIUM-ZINC PO, folic acid, OVER THE COUNTER MEDICATION, traZODone, lubiprostone, rosuvastatin, clonazePAM, diclofenac, and DULoxetine.  Meds ordered this encounter  Medications  . ondansetron (ZOFRAN) 4 MG tablet    Sig: Take 1 tablet (4 mg total) by mouth every 8 (eight) hours as needed for nausea or vomiting.    Dispense:  20 tablet    Refill:  0  . esomeprazole (NEXIUM) 20 MG packet    Sig: Take 20 mg by mouth daily before breakfast.    Dispense:  30 each    Refill:  2     Follow-up: Return in about 2 weeks (around 09/26/2015) for a follow-up visit.  Sonda Primes, MD

## 2015-09-12 NOTE — Progress Notes (Signed)
Pre visit review using our clinic review tool, if applicable. No additional management support is needed unless otherwise documented below in the visit note. 

## 2015-09-12 NOTE — Assessment & Plan Note (Signed)
Hold Crestor 

## 2015-09-12 NOTE — Patient Instructions (Signed)
Rosuvastatin Tablets What is this medicine? ROSUVASTATIN (roe SOO va sta tin) is known as a HMG-CoA reductase inhibitor or 'statin'. It lowers cholesterol and triglycerides in the blood. This drug may also reduce the risk of heart attack, stroke, or other health problems in patients with risk factors for heart disease. Diet and lifestyle changes are often used with this drug. This medicine may be used for other purposes; ask your health care provider or pharmacist if you have questions. What should I tell my health care provider before I take this medicine? They need to know if you have any of these conditions: -frequently drink alcoholic beverages -kidney disease -liver disease -muscle aches or weakness -other medical condition -an unusual or allergic reaction to rosuvastatin, other medicines, foods, dyes, or preservatives -pregnant or trying to get pregnant -breast-feeding How should I use this medicine? Take this medicine by mouth with a glass of water. Follow the directions on the prescription label. Do not cut, crush or chew this medicine. You can take this medicine with or without food. Take your doses at regular intervals. Do not take your medicine more often than directed. Talk to your pediatrician regarding the use of this medicine in children. While this drug may be prescribed for children as young as 53 years old for selected conditions, precautions do apply. Overdosage: If you think you have taken too much of this medicine contact a poison control center or emergency room at once. NOTE: This medicine is only for you. Do not share this medicine with others. What if I miss a dose? If you miss a dose, take it as soon as you can. Do not take 2 doses within 12 hours of each other. If there are less than 12 hours until your next dose, take only that dose. Do not take double or extra doses. What may interact with this medicine? Do not take this medicine with any of the following  medications: -herbal medicines like red yeast rice This medicine may also interact with the following medications: -alcohol -antacids containing aluminum hydroxide or magnesium hydroxide -cyclosporine -other medicines for high cholesterol -some medicines for HIV infection -warfarin This list may not describe all possible interactions. Give your health care provider a list of all the medicines, herbs, non-prescription drugs, or dietary supplements you use. Also tell them if you smoke, drink alcohol, or use illegal drugs. Some items may interact with your medicine. What should I watch for while using this medicine? Visit your doctor or health care professional for regular check-ups. You may need regular tests to make sure your liver is working properly. Tell your doctor or health care professional right away if you get any unexplained muscle pain, tenderness, or weakness, especially if you also have a fever and tiredness. Your doctor or health care professional may tell you to stop taking this medicine if you develop muscle problems. If your muscle problems do not go away after stopping this medicine, contact your health care professional. This medicine may affect blood sugar levels. If you have diabetes, check with your doctor or health care professional before you change your diet or the dose of your diabetic medicine. Avoid taking antacids containing aluminum, calcium or magnesium within 2 hours of taking this medicine. This drug is only part of a total heart-health program. Your doctor or a dietician can suggest a low-cholesterol and low-fat diet to help. Avoid alcohol and smoking, and keep a proper exercise schedule. Do not use this drug if you are pregnant or breast-feeding.  Serious side effects to an unborn child or to an infant are possible. Talk to your doctor or pharmacist for more information. What side effects may I notice from receiving this medicine? Side effects that you should report  to your doctor or health care professional as soon as possible: -allergic reactions like skin rash, itching or hives, swelling of the face, lips, or tongue -dark urine -fever -joint pain -muscle cramps, pain -redness, blistering, peeling or loosening of the skin, including inside the mouth -trouble passing urine or change in the amount of urine -unusually weak or tired -yellowing of the eyes or skin Side effects that usually do not require medical attention (report to your doctor or health care professional if they continue or are bothersome): -constipation -heartburn -nausea -stomach gas, pain, upset This list may not describe all possible side effects. Call your doctor for medical advice about side effects. You may report side effects to FDA at 1-800-FDA-1088. Where should I keep my medicine? Keep out of the reach of children. Store at room temperature between 20 and 25 degrees C (68 and 77 degrees F). Keep container tightly closed (protect from moisture). Throw away any unused medicine after the expiration date. NOTE: This sheet is a summary. It may not cover all possible information. If you have questions about this medicine, talk to your doctor, pharmacist, or health care provider.    2016, [employer]. (2015-03-15 13:33:08)

## 2015-09-12 NOTE — Assessment & Plan Note (Signed)
11/16 - upper abd - could be Crestor related vs other Hold Crestor Labs CT if not better RTC 2 weeks

## 2015-10-05 ENCOUNTER — Ambulatory Visit: Payer: Medicare HMO | Admitting: Internal Medicine

## 2015-10-05 ENCOUNTER — Ambulatory Visit (INDEPENDENT_AMBULATORY_CARE_PROVIDER_SITE_OTHER): Payer: Medicare HMO | Admitting: Internal Medicine

## 2015-10-05 ENCOUNTER — Encounter: Payer: Self-pay | Admitting: Internal Medicine

## 2015-10-05 VITALS — BP 150/88 | HR 75 | Wt 128.0 lb

## 2015-10-05 DIAGNOSIS — F329 Major depressive disorder, single episode, unspecified: Secondary | ICD-10-CM

## 2015-10-05 DIAGNOSIS — N941 Unspecified dyspareunia: Secondary | ICD-10-CM | POA: Diagnosis not present

## 2015-10-05 DIAGNOSIS — R1010 Upper abdominal pain, unspecified: Secondary | ICD-10-CM

## 2015-10-05 DIAGNOSIS — F32A Depression, unspecified: Secondary | ICD-10-CM

## 2015-10-05 DIAGNOSIS — E7800 Pure hypercholesterolemia, unspecified: Secondary | ICD-10-CM | POA: Diagnosis not present

## 2015-10-05 DIAGNOSIS — R69 Illness, unspecified: Secondary | ICD-10-CM | POA: Diagnosis not present

## 2015-10-05 MED ORDER — PRAVASTATIN SODIUM 40 MG PO TABS: 40.0000 mg | ORAL_TABLET | Freq: Every day | ORAL | Status: DC

## 2015-10-05 MED ORDER — FLUOXETINE HCL 40 MG PO CAPS: 40.0000 mg | ORAL_CAPSULE | Freq: Every day | ORAL | Status: DC

## 2015-10-05 NOTE — Assessment & Plan Note (Signed)
Resolved off Crestor

## 2015-10-05 NOTE — Patient Instructions (Addendum)
Read about Zetia:   Ezetimibe Tablets What is this medicine? EZETIMIBE (ez ET i mibe) blocks the absorption of cholesterol from the stomach. It can help lower blood cholesterol for patients who are at risk of getting heart disease or a stroke. It is only for patients whose cholesterol level is not controlled by diet. This medicine may be used for other purposes; ask your health care provider or pharmacist if you have questions. What should I tell my health care provider before I take this medicine? They need to know if you have any of these conditions: -liver disease -an unusual or allergic reaction to ezetimibe, medicines, foods, dyes, or preservatives -pregnant or trying to get pregnant -breast-feeding How should I use this medicine? Take this medicine by mouth with a glass of water. Follow the directions on the prescription label. This medicine can be taken with or without food. Take your doses at regular intervals. Do not take your medicine more often than directed. Talk to your pediatrician regarding the use of this medicine in children. Special care may be needed. Overdosage: If you think you have taken too much of this medicine contact a poison control center or emergency room at once. NOTE: This medicine is only for you. Do not share this medicine with others. What if I miss a dose? If you miss a dose, take it as soon as you can. If it is almost time for your next dose, take only that dose. Do not take double or extra doses. What may interact with this medicine? Do not take this medicine with any of the following medications: -fenofibrate -gemfibrozil This medicine may also interact with the following medications: -antacids -cyclosporine -herbal medicines like red yeast rice -other medicines to lower cholesterol or triglycerides This list may not describe all possible interactions. Give your health care provider a list of all the medicines, herbs, non-prescription drugs, or  dietary supplements you use. Also tell them if you smoke, drink alcohol, or use illegal drugs. Some items may interact with your medicine. What should I watch for while using this medicine? Visit your doctor or health care professional for regular checks on your progress. You will need to have your cholesterol levels checked. If you are also taking some other cholesterol medicines, you will also need to have tests to make sure your liver is working properly. Tell your doctor or health care professional if you get any unexplained muscle pain, tenderness, or weakness, especially if you also have a fever and tiredness. You need to follow a low-cholesterol, low-fat diet while you are taking this medicine. This will decrease your risk of getting heart and blood vessel disease. Exercising and avoiding alcohol and smoking can also help. Ask your doctor or dietician for advice. What side effects may I notice from receiving this medicine? Side effects that you should report to your doctor or health care professional as soon as possible: -allergic reactions like skin rash, itching or hives, swelling of the face, lips, or tongue -dark yellow or brown urine -unusually weak or tired -yellowing of the skin or eyes Side effects that usually do not require medical attention (report to your doctor or health care professional if they continue or are bothersome): -diarrhea -dizziness -headache -stomach upset or pain This list may not describe all possible side effects. Call your doctor for medical advice about side effects. You may report side effects to FDA at 1-800-FDA-1088. Where should I keep my medicine? Keep out of the reach of children. Store at  room temperature between 15 and 30 degrees C (59 and 86 degrees F). Protect from moisture. Keep container tightly closed. Throw away any unused medicine after the expiration date. NOTE: This sheet is a summary. It may not cover all possible information. If you have  questions about this medicine, talk to your doctor, pharmacist, or health care provider.    2016, Elsevier/[employer]. (2012-04-05 15:39:09)

## 2015-10-05 NOTE — Assessment & Plan Note (Signed)
12/16 re-start Fluoxetine. D/c Cymbalta - nausea

## 2015-10-05 NOTE — Assessment & Plan Note (Signed)
Start Pravachol - may need to add Zetia

## 2015-10-05 NOTE — Progress Notes (Signed)
Pre visit review using our clinic review tool, if applicable. No additional management support is needed unless otherwise documented below in the visit note. 

## 2015-10-05 NOTE — Progress Notes (Signed)
Subjective:  Patient ID: Shannon Sutton, female    DOB: Jun 19, 1946  Age: 69 y.o. MRN: [id number]  CC: No chief complaint on file.   HPI Shannon Sutton presents for abd pain and bloating - almost resolved off Crestor. C/o new pain during intercourse w/a new partner.  Outpatient Prescriptions Prior to Visit  Medication Sig Dispense Refill  . B Complex-C (SUPER B COMPLEX) TABS Take 1 tablet by mouth daily.     Marland Kitchen CALCIUM-MAGNESIUM-ZINC PO Take 1 capsule by mouth daily.     . Cholecalciferol (VITAMIN D3) 2000 UNITS TABS Take 1 tablet by mouth daily.      . clonazePAM (KLONOPIN) 0.5 MG tablet Take 1-2 tablets (0.5-1 mg total) by mouth at bedtime as needed. 60 tablet 5  . diclofenac (VOLTAREN) 75 MG EC tablet Take 1 tablet (75 mg total) by mouth 2 (two) times daily. 30 tablet 0  . esomeprazole (NEXIUM) 20 MG packet Take 20 mg by mouth daily before breakfast. 30 each 2  . folic acid (FOLVITE) 800 MCG tablet Take 400 mcg by mouth daily.    Marland Kitchen lubiprostone (AMITIZA) 24 MCG capsule Take 1 capsule (24 mcg total) by mouth 2 (two) times daily with a meal. 180 capsule 3  . Multiple Vitamins-Minerals (MULTIVITAMIN WITH MINERALS) tablet Take 1 tablet by mouth daily.      . Omega-3 Fatty Acids (OMEGA-3 FISH OIL PO) Take by mouth.      . ondansetron (ZOFRAN) 4 MG tablet Take 1 tablet (4 mg total) by mouth every 8 (eight) hours as needed for nausea or vomiting. 20 tablet 0  . OVER THE COUNTER MEDICATION Take 1 tablet by mouth 2 (two) times daily. BAYER BACK AND BODY WITH [id number]    . traZODone (DESYREL) 50 MG tablet Take 1 tablet (50 mg total) by mouth at bedtime. 90 tablet 3  . DULoxetine (CYMBALTA) 60 MG capsule Take 1 capsule (60 mg total) by mouth daily. 30 capsule 5  . rosuvastatin (CRESTOR) 20 MG tablet Take 1 tablet (20 mg total) by mouth daily. (Patient not taking: Reported on 10/05/2015) 30 tablet 3   No facility-administered medications prior to visit.    ROS Review of Systems    Constitutional: Negative for chills, activity change, appetite change, fatigue and unexpected weight change.  HENT: Negative for congestion, mouth sores and sinus pressure.   Eyes: Negative for visual disturbance.  Respiratory: Negative for cough and chest tightness.   Gastrointestinal: Positive for nausea. Negative for abdominal pain.  Genitourinary: Positive for pelvic pain. Negative for frequency, vaginal bleeding, difficulty urinating and vaginal pain.  Musculoskeletal: Negative for back pain and gait problem.  Skin: Negative for pallor and rash.  Neurological: Negative for dizziness, tremors, weakness, numbness and headaches.  Psychiatric/Behavioral: Negative for suicidal ideas, confusion and sleep disturbance.    Objective:  BP 150/88 mmHg  Pulse 75  Wt 128 lb (58.06 kg)  SpO2 98%  BP Readings from Last 3 Encounters:  10/05/15 150/88  09/12/15 134/74  07/06/15 138/80    Wt Readings from Last 3 Encounters:  10/05/15 128 lb (58.06 kg)  09/12/15 127 lb (57.607 kg)  07/06/15 127 lb (57.607 kg)    Physical Exam  Constitutional: She appears well-developed. No distress.  HENT:  Head: Normocephalic.  Right Ear: External ear normal.  Left Ear: External ear normal.  Nose: Nose normal.  Mouth/Throat: Oropharynx is clear and moist.  Eyes: Conjunctivae are normal. Pupils are equal, round, and reactive to light. Right eye  exhibits no discharge. Left eye exhibits no discharge.  Neck: Normal range of motion. Neck supple. No JVD present. No tracheal deviation present. No thyromegaly present.  Cardiovascular: Normal rate, regular rhythm and normal heart sounds.   Pulmonary/Chest: No stridor. No respiratory distress. She has no wheezes.  Abdominal: Soft. Bowel sounds are normal. She exhibits no distension and no mass. There is no tenderness. There is no rebound and no guarding.  Genitourinary: Guaiac negative stool. No vaginal discharge found.  Musculoskeletal: She exhibits no edema  or tenderness.  Lymphadenopathy:    She has no cervical adenopathy.  Neurological: She displays normal reflexes. No cranial nerve deficit. She exhibits normal muscle tone. Coordination normal.  Skin: No rash noted. No erythema.  Psychiatric: She has a normal mood and affect. Her behavior is normal. Judgment and thought content normal.  Vag pouch mucosa atrophic, no mass or active lesions. Vag wall is tender over anterior wall. A mild bladder prolapse is present. G(-) stool.   Lab Results  Component Value Date   WBC 5.2 09/12/2015   HGB 13.9 09/12/2015   HCT 41.2 09/12/2015   PLT 221.0 09/12/2015   GLUCOSE 92 09/12/2015   CHOL 157 04/27/2015   TRIG 73.0 04/27/2015   HDL 69.80 04/27/2015   LDLCALC 73 04/27/2015   ALT 15 09/12/2015   AST 18 09/12/2015   NA 141 09/12/2015   K 4.3 09/12/2015   CL 104 09/12/2015   CREATININE 0.60 09/12/2015   BUN 20 09/12/2015   CO2 29 09/12/2015   TSH 3.28 09/12/2015    Mm Screening Breast Tomo Bilateral  05/04/2015  CLINICAL DATA:  Screening. EXAM: DIGITAL SCREENING BILATERAL MAMMOGRAM WITH 3D TOMO WITH CAD COMPARISON:  Previous exam(s). ACR Breast Density Category c: The breast tissue is heterogeneously dense, which may obscure small masses. FINDINGS: There are no findings suspicious for malignancy. Images were processed with CAD. IMPRESSION: No mammographic evidence of malignancy. A result letter of this screening mammogram will be mailed directly to the patient. RECOMMENDATION: Screening mammogram in one year. (Code:SM-B-01Y) BI-RADS CATEGORY  1: Negative. Electronically Signed   By: Annia Belt M.D.   On: 05/04/2015 20:37    Assessment & Plan:   Diagnoses and all orders for this visit:  Pain of upper abdomen -     Basic metabolic panel; Future -     Hepatic function panel; Future -     Lipid panel; Future -     Urinalysis; Future -     CT Abdomen Pelvis W Contrast  Hypercholesterolemia -     Basic metabolic panel; Future -     Hepatic  function panel; Future -     Lipid panel; Future -     Urinalysis; Future  Pain in female genitalia on intercourse -     Basic metabolic panel; Future -     Hepatic function panel; Future -     Lipid panel; Future -     Urinalysis; Future -     CT Abdomen Pelvis W Contrast -     Ambulatory referral to Gynecology  Other orders -     pravastatin (PRAVACHOL) 40 MG tablet; Take 1 tablet (40 mg total) by mouth daily. -     FLUoxetine (PROZAC) 40 MG capsule; Take 1 capsule (40 mg total) by mouth daily.  I have discontinued Ms. Reust's rosuvastatin and DULoxetine. I am also having her start on pravastatin and FLUoxetine. Additionally, I am having her maintain her Vitamin D3, SUPER B COMPLEX,  multivitamin with minerals, Omega-3 Fatty Acids (OMEGA-3 FISH OIL PO), CALCIUM-MAGNESIUM-ZINC PO, folic acid, OVER THE COUNTER MEDICATION, traZODone, lubiprostone, clonazePAM, diclofenac, ondansetron, and esomeprazole.  Meds ordered this encounter  Medications  . pravastatin (PRAVACHOL) 40 MG tablet    Sig: Take 1 tablet (40 mg total) by mouth daily.    Dispense:  90 tablet    Refill:  3  . FLUoxetine (PROZAC) 40 MG capsule    Sig: Take 1 capsule (40 mg total) by mouth daily.    Dispense:  90 capsule    Refill:  3     Follow-up: Return in about 3 months (around 01/03/2016) for a follow-up visit.  Sonda Primes, MD

## 2015-10-05 NOTE — Assessment & Plan Note (Addendum)
New ?etiology Vag pouch mucosa atrophic, no mass or active lesions. Vag wall is tender over anterior wall. A mild bladder prolapse is present. G(-) stool.  CT abd DrJertson

## 2015-10-09 ENCOUNTER — Telehealth: Payer: Self-pay | Admitting: Obstetrics and Gynecology

## 2015-10-09 ENCOUNTER — Other Ambulatory Visit (INDEPENDENT_AMBULATORY_CARE_PROVIDER_SITE_OTHER): Payer: Medicare HMO

## 2015-10-09 ENCOUNTER — Encounter: Payer: Self-pay | Admitting: Internal Medicine

## 2015-10-09 DIAGNOSIS — E7800 Pure hypercholesterolemia, unspecified: Secondary | ICD-10-CM | POA: Diagnosis not present

## 2015-10-09 DIAGNOSIS — R1010 Upper abdominal pain, unspecified: Secondary | ICD-10-CM

## 2015-10-09 DIAGNOSIS — R69 Illness, unspecified: Secondary | ICD-10-CM | POA: Diagnosis not present

## 2015-10-09 DIAGNOSIS — N941 Unspecified dyspareunia: Secondary | ICD-10-CM

## 2015-10-09 DIAGNOSIS — F329 Major depressive disorder, single episode, unspecified: Secondary | ICD-10-CM | POA: Diagnosis not present

## 2015-10-09 DIAGNOSIS — F32A Depression, unspecified: Secondary | ICD-10-CM

## 2015-10-09 DIAGNOSIS — G2581 Restless legs syndrome: Secondary | ICD-10-CM | POA: Diagnosis not present

## 2015-10-09 DIAGNOSIS — M354 Diffuse (eosinophilic) fasciitis: Secondary | ICD-10-CM | POA: Diagnosis not present

## 2015-10-09 DIAGNOSIS — D7218 Eosinophilia in diseases classified elsewhere: Secondary | ICD-10-CM

## 2015-10-09 LAB — URINALYSIS, ROUTINE W REFLEX MICROSCOPIC
BILIRUBIN URINE: NEGATIVE
Hgb urine dipstick: NEGATIVE
KETONES UR: NEGATIVE
Nitrite: NEGATIVE
PH: 6.5 (ref 5.0–8.0)
RBC / HPF: NONE SEEN (ref 0–?)
Specific Gravity, Urine: 1.01 (ref 1.000–1.030)
Total Protein, Urine: NEGATIVE
UROBILINOGEN UA: 0.2 (ref 0.0–1.0)
Urine Glucose: NEGATIVE

## 2015-10-09 LAB — HEPATIC FUNCTION PANEL
ALT: 14 U/L (ref 0–35)
AST: 16 U/L (ref 0–37)
Albumin: 4 g/dL (ref 3.5–5.2)
Alkaline Phosphatase: 57 U/L (ref 39–117)
BILIRUBIN DIRECT: 0.1 mg/dL (ref 0.0–0.3)
BILIRUBIN TOTAL: 0.4 mg/dL (ref 0.2–1.2)
TOTAL PROTEIN: 6.5 g/dL (ref 6.0–8.3)

## 2015-10-09 LAB — LIPID PANEL
CHOL/HDL RATIO: 4
CHOLESTEROL: 248 mg/dL — AB (ref 0–200)
HDL: 69.5 mg/dL (ref >39.00)
LDL CALC: 159 mg/dL — AB (ref 0–99)
NonHDL: 178.96
TRIGLYCERIDES: 98 mg/dL (ref 0.0–149.0)
VLDL: 19.6 mg/dL (ref 0.0–40.0)

## 2015-10-09 LAB — BASIC METABOLIC PANEL
BUN: 13 mg/dL (ref 6–23)
CALCIUM: 9.2 mg/dL (ref 8.4–10.5)
CO2: 31 mEq/L (ref 19–32)
CREATININE: 0.64 mg/dL (ref 0.40–1.20)
Chloride: 104 mEq/L (ref 96–112)
GFR: 97.03 mL/min (ref >60.00)
Glucose, Bld: 96 mg/dL (ref 70–99)
Potassium: 3.9 mEq/L (ref 3.5–5.1)
Sodium: 142 mEq/L (ref 135–145)

## 2015-10-09 MED ORDER — CIPROFLOXACIN HCL 250 MG PO TABS: 250.0000 mg | ORAL_TABLET | Freq: Two times a day (BID) | ORAL | Status: DC

## 2015-10-09 NOTE — Telephone Encounter (Signed)
Called and left a message for patient to call back to schedule a new patient doctor referral. °

## 2015-10-10 ENCOUNTER — Encounter: Payer: Self-pay | Admitting: Obstetrics and Gynecology

## 2015-10-10 ENCOUNTER — Ambulatory Visit (INDEPENDENT_AMBULATORY_CARE_PROVIDER_SITE_OTHER): Payer: Medicare HMO | Admitting: Obstetrics and Gynecology

## 2015-10-10 VITALS — BP 102/60 | HR 80 | Resp 16 | Ht 61.0 in | Wt 130.0 lb

## 2015-10-10 DIAGNOSIS — M6289 Other specified disorders of muscle: Secondary | ICD-10-CM

## 2015-10-10 DIAGNOSIS — N941 Unspecified dyspareunia: Secondary | ICD-10-CM

## 2015-10-10 DIAGNOSIS — K5900 Constipation, unspecified: Secondary | ICD-10-CM | POA: Diagnosis not present

## 2015-10-10 DIAGNOSIS — N3281 Overactive bladder: Secondary | ICD-10-CM | POA: Diagnosis not present

## 2015-10-10 DIAGNOSIS — N8184 Pelvic muscle wasting: Secondary | ICD-10-CM | POA: Diagnosis not present

## 2015-10-10 DIAGNOSIS — R350 Frequency of micturition: Secondary | ICD-10-CM

## 2015-10-10 NOTE — Patient Instructions (Signed)
Overactive Bladder, Adult Overactive bladder is a group of urinary symptoms. With overactive bladder, you may suddenly feel the need to pass urine (urinate) right away. After feeling this sudden urge, you might also leak urine if you cannot get to the bathroom fast enough (urinary incontinence). These symptoms might interfere with your daily work or social activities. Overactive bladder symptoms may also wake you up at night. Overactive bladder affects the nerve signals between your bladder and your brain. Your bladder may get the signal to empty before it is full. Very sensitive muscles can also make your bladder squeeze too soon. CAUSES Many things can cause an overactive bladder. Possible causes include:  Urinary tract infection.  Infection of nearby tissues, such as the prostate.  Prostate enlargement.  Being pregnant with twins or more (multiples).  Surgery on the uterus or urethra.  Bladder stones, inflammation, or tumors.  Drinking too much caffeine or alcohol.  Certain medicines, especially those that you take to help your body get rid of extra fluid (diuretics) by increasing urine production.  Muscle or nerve weakness, especially from:  A spinal cord injury.  Stroke.  Multiple sclerosis.  Parkinson disease.  Diabetes. This can cause a high urine volume that fills the bladder so quickly that the normal urge to urinate is triggered very strongly.  Constipation. A buildup of too much stool can put pressure on your bladder. RISK FACTORS You may be at greater risk for overactive bladder if you:  Are an older adult.  Smoke.  Are going through menopause.  Have prostate problems.  Have a neurological disease, such as stroke, dementia, Parkinson disease, or multiple sclerosis (MS).  Eat or drink things that irritate the bladder. These include alcohol, spicy food, and caffeine.  Are overweight or obese. SIGNS AND SYMPTOMS  The signs and symptoms of an overactive  bladder include:  Sudden, strong urges to urinate.  Leaking urine.  Urinating eight or more times per day.  Waking up to urinate two or more times per night. DIAGNOSIS Your health care provider may suspect overactive bladder based on your symptoms. The health care provider will do a physical exam and take your medical history. Blood or urine tests may also be done. For example, you might need to have a bladder function test to check how well you can hold your urine. You might also need to see a health care provider who specializes in the urinary tract (urologist). TREATMENT Treatment for overactive bladder depends on the cause of your condition and whether it is mild or severe. Certain treatments can be done in your health care provider's office or clinic. You can also make lifestyle changes at home. Options include: Behavioral Treatments  Biofeedback. A specialist uses sensors to help you become aware of your body's signals.  Keeping a daily log of when you need to urinate and what happens after the urge. This may help you manage your condition.  Bladder training. This helps you learn to control the urge to urinate by following a schedule that directs you to urinate at regular intervals (timed voiding). At first, you might have to wait a few minutes after feeling the urge. In time, you should be able to schedule bathroom visits an hour or more apart.  Kegel exercises. These are exercises to strengthen the pelvic floor muscles, which support the bladder. Toning these muscles can help you control urination, even if your bladder muscles are overactive. A specialist will teach you how to do these exercises correctly. They   require daily practice.  Weight loss. If you are obese or overweight, losing weight might relieve your symptoms of overactive bladder. Talk to your health care provider about losing weight and whether there is a specific program or method that would work best for you.  Diet  change. This might help if constipation is making your overactive bladder worse. Your health care provider or a dietitian can explain ways to change what you eat to ease constipation. You might also need to consume less alcohol and caffeine or drink other fluids at different times of the day.  Stopping smoking.  Wearing pads to absorb leakage while you wait for other treatments to take effect. Physical Treatments  Electrical stimulation. Electrodes send gentle pulses of electricity to strengthen the nerves or muscles that help to control the bladder. Sometimes, the electrodes are placed outside of the body. In other cases, they might be placed inside the body (implanted). This treatment can take several months to have an effect.  Supportive devices. Women may need a plastic device that fits into the vagina and supports the bladder (pessary). Medicines Several medicines can help treat overactive bladder and are usually used along with other treatments. Some are injected into the muscles involved in urination. Others come in pill form. Your health care provider may prescribe:  Antispasmodics. These medicines block the signals that the nerves send to the bladder. This keeps the bladder from releasing urine at the wrong time.  Tricyclic antidepressants. These types of antidepressants also relax bladder muscles. Surgery  You may have a device implanted to help manage the nerve signals that indicate when you need to urinate.  You may have surgery to implant electrodes for electrical stimulation.  Sometimes, very severe cases of overactive bladder require surgery to change the shape of the bladder. HOME CARE INSTRUCTIONS   Take medicines only as directed by your health care provider.  Use any implants or a pessary as directed by your health care provider.  Make any diet or lifestyle changes that are recommended by your health care provider. These might include:  Drinking less fluid or  drinking at different times of the day. If you need to urinate often during the night, you may need to stop drinking fluids early in the evening.  Cutting down on caffeine or alcohol. Both can make an overactive bladder worse. Caffeine is found in coffee, tea, and sodas.  Doing Kegel exercises to strengthen muscles.  Losing weight if you need to.  Eating a healthy and balanced diet to prevent constipation.  Keep a journal or log to track how much and when you drink and also when you feel the need to urinate. This will help your health care provider to monitor your condition. SEEK MEDICAL CARE IF:  Your symptoms do not get better after treatment.  Your pain and discomfort are getting worse.  You have more frequent urges to urinate.  You have a fever. SEEK IMMEDIATE MEDICAL CARE IF: You are not able to control your bladder at all.   This information is not intended to replace advice given to you by your health care provider. Make sure you discuss any questions you have with your health care provider.   Document Released: 07/26/2009 Document Revised: 10/20/2014 Document Reviewed: 02/22/2014 Elsevier Interactive Patient Education 2016 [employer]. About Constipation  Constipation Overview Constipation is the most common gastrointestinal complaint - about 4 million Americans experience constipation and make 2.5 million physician visits a year to get help for the problem.  Constipation can occur when the colon absorbs too much water, the colon's muscle contraction is slow or sluggish, and/or there is delayed transit time through the colon.  The result is stool that is hard and dry.  Indicators of constipation include straining during bowel movements greater than 25% of the time, having fewer than three bowel movements per week, and/or the feeling of incomplete evacuation.  There are established guidelines ([place] II ) for defining constipation. A person needs to have two or more of the  following symptoms for at least 12 weeks (not necessarily consecutive) in the preceding 12 months: . Straining in  greater than 25% of bowel movements . Lumpy or hard stools in greater than 25% of bowel movements . Sensation of incomplete emptying in greater than 25% of bowel movements . Sensation of anorectal obstruction/blockade in greater than 25% of bowel movements . Manual maneuvers to help empty greater than 25% of bowel movements (e.g., digital evacuation, support of the pelvic floor)  . Less than  3 bowel movements/week . Loose stools are not present, and criteria for irritable bowel syndrome are insufficient  Common Causes of Constipation . Lack of fiber in your diet . Lack of physical activity . Medications, including iron and calcium supplements  . Dairy intake . Dehydration . Abuse of laxatives  Travel  Irritable Bowel Syndrome  Pregnancy  Luteal phase of menstruation (after ovulation and before menses)  Colorectal problems  Intestinal Dysfunction  Treating Constipation  There are several ways of treating constipation, including changes to diet and exercise, use of laxatives, adjustments to the pelvic floor, and scheduled toileting.  These treatments include: . increasing fiber and fluids in the diet  . increasing physical activity . learning muscle coordination   learning proper toileting techniques and toileting modifications   designing and sticking  to a toileting schedule     2007, [employer] Doc.22

## 2015-10-10 NOTE — Progress Notes (Signed)
GYNECOLOGY  VISIT   HPI: 69 y.o.   Divorced  Caucasian  female   [phone] with No LMP recorded. Patient has had a hysterectomy. She is by Dr Alain Marion for evaluation of pain with intercourse.    She had a TVH/BSO and a bladder sling in 1999. She did well for a long time. She c/o a 1 year h/o urinary frequency, up to 20 x a day. She typically voids small amounts, some urgency and some trouble initiating voiding. She has to change position to empty her bladder and feels that she is not emptying her bladder fully. Her primary just sent in an antibiotics for a UTI based on her ua. She hasn't started it yet.  She denies dysuria or change in her urinary symptoms. Negative urine culture in 11/16. Few episodes of urge incontinence at night only. She c/o some vaginal pressure, she often has the sensation she has to void. She denies obvious prolapse.  She drinks about 44 oz of caffeinated soda a day. She c/o long term constipation (all her life). She only has a BM 1 x a week and needs to take a laxative to have a BM. She is on Amitiza.  Recently in a new relationship, he said he was "hitting something" with intercourse. It is too painful to have sex with him, it hurts deep inside. No entry dyspareunia. Prior sexual relationship was 5 months ago, no pain.  She has a h/o a rare disorder of eosinophilic fascitis. In remission.  GYNECOLOGIC HISTORY: No LMP recorded. Patient has had a hysterectomy. Contraception: Hysterectomy  Menopausal hormone therapy: None        OB History    Gravida Para Term Preterm AB TAB SAB Ectopic Multiple Living   3 2 2  0 1 0 1 0 0 2         Patient Active Problem List   Diagnosis Date Noted  . Pain in female genitalia on intercourse 10/05/2015  . Abdominal pain 09/12/2015  . Fatigue 08/30/2014  . Generalized headaches 08/30/2014  . Hypercholesterolemia 12/15/2013  . Restless leg syndrome 12/15/2013  . Anxiety 12/15/2013  . Fibromyalgia with myofascial pain 11/16/2012  .  Eosinophilic fasciitis [id number]  . Insomnia 11/16/2012  . Depression 11/16/2012    Past Medical History  Diagnosis Date  . Diverticulosis   . Diverticulitis   . Adenomatous colon polyp 1990  . External hemorrhoids   . Myalgia and myositis, unspecified   . Other disorder of muscle, ligament, and fascia   . Pain in joint, multiple sites   . Depression 11/16/2012  . 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 Fibromyalgia with myofascial pain 11/16/2012  . Insomnia 11/16/2012  . Arthritis   . Headaches, cluster   . Allergy   . Hyperlipidemia   . Urine incontinence   . Anxiety 12/15/2013  . Hypercholesterolemia 12/15/2013  . Restless leg syndrome 12/15/2013  . Hypertension   . Unspecified sleep apnea   . Other specified disease of hair and hair follicles   . Chronic constipation     Past Surgical History  Procedure Laterality Date  . Breast reduction surgery  1995  . Total abdominal hysterectomy  1999  . Abdominoplasty  2006    "tummy tuck"  . Breast surgery  1981    left breast biopsy  . Muscle biopsy  [id number]    DEEP TISSUE  . Tonsillectomy    . Wisdom tooth extraction      Current Outpatient Prescriptions  Medication Sig Dispense Refill  .  aspirin EC 81 MG tablet Take 81 mg by mouth daily.    . B Complex-C (SUPER B COMPLEX) TABS Take 1 tablet by mouth daily.     Marland Kitchen CALCIUM-MAGNESIUM-ZINC PO Take 1 capsule by mouth daily.     . Cholecalciferol (VITAMIN D3) 2000 UNITS TABS Take 1 tablet by mouth daily.      . clonazePAM (KLONOPIN) 0.5 MG tablet Take 1-2 tablets (0.5-1 mg total) by mouth at bedtime as needed. 60 tablet 5  . diclofenac (VOLTAREN) 75 MG EC tablet Take 1 tablet (75 mg total) by mouth 2 (two) times daily. 30 tablet 0  . FLUoxetine (PROZAC) 40 MG capsule Take 1 capsule (40 mg total) by mouth daily. 90 capsule 3  . folic acid (FOLVITE) Q000111Q MCG tablet Take 400 mcg by mouth daily.    Marland Kitchen lubiprostone (AMITIZA) 24 MCG capsule Take 1 capsule (24 mcg total) by mouth 2 (two)  times daily with a meal. 180 capsule 3  . Multiple Vitamins-Minerals (MULTIVITAMIN WITH MINERALS) tablet Take 1 tablet by mouth daily.      . Omega-3 Fatty Acids (OMEGA-3 FISH OIL PO) Take by mouth.      . ondansetron (ZOFRAN) 4 MG tablet Take 1 tablet (4 mg total) by mouth every 8 (eight) hours as needed for nausea or vomiting. 20 tablet 0  . OVER THE COUNTER MEDICATION Take 1 tablet by mouth 2 (two) times daily. BAYER BACK AND BODY WITH [id number]    . traZODone (DESYREL) 50 MG tablet Take 1 tablet (50 mg total) by mouth at bedtime. 90 tablet 3  . ciprofloxacin (CIPRO) 250 MG tablet Take 1 tablet (250 mg total) by mouth 2 (two) times daily. (Patient not taking: Reported on 10/10/2015) 8 tablet 0  . esomeprazole (NEXIUM) 20 MG packet Take 20 mg by mouth daily before breakfast. (Patient not taking: Reported on 10/10/2015) 30 each 2  . pravastatin (PRAVACHOL) 40 MG tablet Take 1 tablet (40 mg total) by mouth daily. (Patient not taking: Reported on 10/10/2015) 90 tablet 3   No current facility-administered medications for this visit.     ALLERGIES: Crestor; Cymbalta; Morphine and related; and Promethazine-codeine  Family History  Problem Relation Age of Onset  . Colon cancer Father 5  . Heart disease Father   . Cancer Father     COLON  . Colon polyps Mother   . Heart disease Mother   . Breast cancer Paternal Grandmother   . COPD Sister   . Emphysema Sister   . Stomach cancer Neg Hx   . Heart disease Other     family history  . Parkinson's disease Other   . COPD Brother   . Cancer Brother     Lung  . Emphysema Brother   . Heart disease Paternal Grandfather     Social History   Social History  . Marital Status: Divorced    Spouse Name: N/A  . Number of Children: 2  . Years of Education: N/A   Occupational History  . Retired    Social History Main Topics  . Smoking status: Former Smoker    Quit date: 12/19/1986  . Smokeless tobacco: Never Used  . Alcohol Use: 0.6  oz/week    1 Glasses of wine per week     Comment: ONCE OR TWICE A YEAR WILL HAVE WINE  . Drug Use: No  . Sexual Activity: Yes    Birth Control/ Protection: Surgical, Post-menopausal   Other Topics Concern  . Not on file  Social History Narrative    Review of Systems  Genitourinary: Positive for dysuria.  All other systems reviewed and are negative.   PHYSICAL EXAMINATION:    BP 102/60 mmHg  Pulse 80  Resp 16  Ht 5\' 1"  (1.549 m)  Wt 130 lb (58.968 kg)  BMI 24.58 kg/m2    General appearance: alert, cooperative and appears stated age Abdomen: soft, non-tender; bowel sounds normal; no masses,  no organomegaly  Pelvic: External genitalia:  no lesions              Urethra:  normal appearing urethra with no masses, tenderness or lesions              Bartholins and Skenes: normal                 Vagina: normal appearing vagina with normal color and discharge, no lesions, mild atrophy              Cervix: absent    Pelvic floor: extremely tender with palpation of her left pelvic floor, mildly tender with palpation of her right pelvic floor              Bimanual Exam:  Uterus:  uterus absent              Adnexa: no mass, fullness, tenderness              Rectovaginal: Yes.  .  Confirms.              Anus:  normal sphincter tone, no lesions   Post void residual: 60 cc  Chaperone was present for exam.  ASSESSMENT Dyspareunia, deep Pelvic floor dysfunction (extremely tender with palpation of the left pelvic floor) Overactive bladder, normal post void residual Severe constipation, on Amitiza and still having to use a laxative weekly to have a BM  PLAN Send to physical therapy for deep dyspareunia/pelvic floor dysfunction Will send a urine culture from her PVR catheterization Given her severe constipation she is not a good candidate for an anti-cholinergic to treat her OAB symptoms Recommended she wean off the caffeine, if her bladder symptoms don't improve, would recommend a  trial of Myrbetriq (she will call, if we start her on Myrbetriq will have her f/u in 1 month) Discussed the risk of long term laxative use. Discussed adding daily metamucil or miralax to her regimen to see if that helps. We also discussed how the caffeine dehydrates her and increases the risk of constipation   An After Visit Summary was printed and given to the patient.    CC: Dr Alain Marion

## 2015-10-11 ENCOUNTER — Encounter: Payer: Self-pay | Admitting: Internal Medicine

## 2015-10-11 LAB — URINE CULTURE
COLONY COUNT: NO GROWTH
Organism ID, Bacteria: NO GROWTH

## 2015-10-12 ENCOUNTER — Other Ambulatory Visit: Payer: Self-pay | Admitting: *Deleted

## 2015-10-12 ENCOUNTER — Telehealth: Payer: Self-pay | Admitting: Obstetrics and Gynecology

## 2015-10-12 DIAGNOSIS — Z1159 Encounter for screening for other viral diseases: Secondary | ICD-10-CM

## 2015-10-12 NOTE — Telephone Encounter (Signed)
Patient is returning a call to Suzy. °

## 2015-10-12 NOTE — Telephone Encounter (Signed)
Left patient a message to inform her of an appointment that has been scheduled for her with a specialist

## 2015-10-13 ENCOUNTER — Other Ambulatory Visit: Payer: Self-pay | Admitting: Internal Medicine

## 2015-10-13 DIAGNOSIS — Z0000 Encounter for general adult medical examination without abnormal findings: Secondary | ICD-10-CM

## 2015-10-16 ENCOUNTER — Encounter: Payer: Self-pay | Admitting: Obstetrics and Gynecology

## 2015-10-16 NOTE — Telephone Encounter (Signed)
Spoke with pt and provided appointment information regarding appointment to [facility]. Pt understood and agreeable with appointment information

## 2015-10-28 ENCOUNTER — Other Ambulatory Visit: Payer: Self-pay | Admitting: Internal Medicine

## 2015-10-28 DIAGNOSIS — R1010 Upper abdominal pain, unspecified: Secondary | ICD-10-CM

## 2015-10-28 DIAGNOSIS — N941 Unspecified dyspareunia: Secondary | ICD-10-CM

## 2015-10-29 ENCOUNTER — Telehealth: Payer: Self-pay | Admitting: Internal Medicine

## 2015-10-29 NOTE — Telephone Encounter (Signed)
Pt called in and wants to know the status on her mri, can you call her today ?

## 2015-10-30 DIAGNOSIS — R399 Unspecified symptoms and signs involving the genitourinary system: Secondary | ICD-10-CM | POA: Diagnosis not present

## 2015-10-30 DIAGNOSIS — M62838 Other muscle spasm: Secondary | ICD-10-CM | POA: Diagnosis not present

## 2015-10-30 DIAGNOSIS — M6281 Muscle weakness (generalized): Secondary | ICD-10-CM | POA: Diagnosis not present

## 2015-10-30 DIAGNOSIS — R278 Other lack of coordination: Secondary | ICD-10-CM | POA: Diagnosis not present

## 2015-10-30 DIAGNOSIS — N941 Unspecified dyspareunia: Secondary | ICD-10-CM | POA: Diagnosis not present

## 2015-11-06 DIAGNOSIS — R69 Illness, unspecified: Secondary | ICD-10-CM | POA: Diagnosis not present

## 2015-11-07 DIAGNOSIS — M6281 Muscle weakness (generalized): Secondary | ICD-10-CM | POA: Diagnosis not present

## 2015-11-07 DIAGNOSIS — M62838 Other muscle spasm: Secondary | ICD-10-CM | POA: Diagnosis not present

## 2015-11-07 DIAGNOSIS — R399 Unspecified symptoms and signs involving the genitourinary system: Secondary | ICD-10-CM | POA: Diagnosis not present

## 2015-11-07 DIAGNOSIS — N941 Unspecified dyspareunia: Secondary | ICD-10-CM | POA: Diagnosis not present

## 2015-11-07 DIAGNOSIS — R278 Other lack of coordination: Secondary | ICD-10-CM | POA: Diagnosis not present

## 2015-11-09 ENCOUNTER — Ambulatory Visit
Admission: RE | Admit: 2015-11-09 | Discharge: 2015-11-09 | Disposition: A | Discharge status: Home / Self Care | Payer: Medicare HMO | Source: Ambulatory Visit | Attending: Internal Medicine | Admitting: Internal Medicine

## 2015-11-09 DIAGNOSIS — R109 Unspecified abdominal pain: Secondary | ICD-10-CM | POA: Diagnosis not present

## 2015-11-09 DIAGNOSIS — R110 Nausea: Secondary | ICD-10-CM | POA: Diagnosis not present

## 2015-11-09 DIAGNOSIS — R1010 Upper abdominal pain, unspecified: Secondary | ICD-10-CM

## 2015-11-21 ENCOUNTER — Encounter: Payer: Self-pay | Admitting: Internal Medicine

## 2015-11-22 ENCOUNTER — Other Ambulatory Visit: Payer: Self-pay | Admitting: Internal Medicine

## 2015-11-22 MED ORDER — BENZONATATE 200 MG PO CAPS: 200.0000 mg | ORAL_CAPSULE | Freq: Three times a day (TID) | ORAL | Status: DC | PRN

## 2015-11-23 ENCOUNTER — Encounter: Payer: Self-pay | Admitting: Internal Medicine

## 2015-11-23 ENCOUNTER — Ambulatory Visit (INDEPENDENT_AMBULATORY_CARE_PROVIDER_SITE_OTHER): Payer: Medicare HMO | Admitting: Internal Medicine

## 2015-11-23 ENCOUNTER — Other Ambulatory Visit (INDEPENDENT_AMBULATORY_CARE_PROVIDER_SITE_OTHER): Payer: Medicare HMO

## 2015-11-23 VITALS — BP 108/78 | HR 74 | Temp 97.4°F | Ht 61.0 in | Wt 126.0 lb

## 2015-11-23 DIAGNOSIS — E7800 Pure hypercholesterolemia, unspecified: Secondary | ICD-10-CM

## 2015-11-23 DIAGNOSIS — M354 Diffuse (eosinophilic) fasciitis: Secondary | ICD-10-CM

## 2015-11-23 DIAGNOSIS — D7218 Eosinophilia in diseases classified elsewhere: Secondary | ICD-10-CM

## 2015-11-23 DIAGNOSIS — E785 Hyperlipidemia, unspecified: Secondary | ICD-10-CM | POA: Diagnosis not present

## 2015-11-23 DIAGNOSIS — R21 Rash and other nonspecific skin eruption: Secondary | ICD-10-CM | POA: Diagnosis not present

## 2015-11-23 DIAGNOSIS — J069 Acute upper respiratory infection, unspecified: Secondary | ICD-10-CM | POA: Diagnosis not present

## 2015-11-23 LAB — HEPATIC FUNCTION PANEL
ALK PHOS: 54 U/L (ref 39–117)
ALT: 20 U/L (ref 0–35)
AST: 19 U/L (ref 0–37)
Albumin: 4.2 g/dL (ref 3.5–5.2)
BILIRUBIN DIRECT: 0.1 mg/dL (ref 0.0–0.3)
BILIRUBIN TOTAL: 0.6 mg/dL (ref 0.2–1.2)
Total Protein: 7.2 g/dL (ref 6.0–8.3)

## 2015-11-23 LAB — LIPID PANEL
CHOL/HDL RATIO: 3
Cholesterol: 210 mg/dL — ABNORMAL HIGH (ref 0–200)
HDL: 75.2 mg/dL (ref >39.00)
LDL CALC: 124 mg/dL — AB (ref 0–99)
NONHDL: 134.33
TRIGLYCERIDES: 52 mg/dL (ref 0.0–149.0)
VLDL: 10.4 mg/dL (ref 0.0–40.0)

## 2015-11-23 MED ORDER — AZITHROMYCIN 250 MG PO TABS: ORAL_TABLET | ORAL | Status: DC

## 2015-11-23 NOTE — Assessment & Plan Note (Signed)
Chronic pain 

## 2015-11-23 NOTE — Assessment & Plan Note (Signed)
On Pravachol Labs 

## 2015-11-23 NOTE — Assessment & Plan Note (Signed)
H simplex vs fixed drug eruption vs dermatitis herpetiformis vs other Clobetasole

## 2015-11-23 NOTE — Patient Instructions (Signed)
H simplex vs fixed drug eruption vs dermatitis herpetiformis vs other

## 2015-11-23 NOTE — Progress Notes (Signed)
Pre visit review using our clinic review tool, if applicable. No additional management support is needed unless otherwise documented below in the visit note. 

## 2015-11-23 NOTE — Assessment & Plan Note (Addendum)
dtr tested (+) for a flu  Flu test Zpac if worse

## 2015-11-23 NOTE — Progress Notes (Signed)
Subjective:  Patient ID: Shannon Sutton, female    DOB: 04-16-1946  Age: 70 y.o. MRN: [id number]  CC: No chief complaint on file.   HPI Shannon Sutton presents for URI sx's x 1 wk - dtr tested (+) for a flu  C/o a red spot on R buttock - itchy, every few months, then it would re-occure  Outpatient Prescriptions Prior to Visit  Medication Sig Dispense Refill  . aspirin EC 81 MG tablet Take 81 mg by mouth daily.    . B Complex-C (SUPER B COMPLEX) TABS Take 1 tablet by mouth daily.     . benzonatate (TESSALON) 200 MG capsule Take 1 capsule (200 mg total) by mouth 3 (three) times daily as needed for cough. 30 capsule 0  . Cholecalciferol (VITAMIN D3) 2000 UNITS TABS Take 1 tablet by mouth daily.      . clonazePAM (KLONOPIN) 0.5 MG tablet Take 1-2 tablets (0.5-1 mg total) by mouth at bedtime as needed. 60 tablet 5  . diclofenac (VOLTAREN) 75 MG EC tablet Take 1 tablet (75 mg total) by mouth 2 (two) times daily. 30 tablet 0  . FLUoxetine (PROZAC) 40 MG capsule Take 1 capsule (40 mg total) by mouth daily. 90 capsule 3  . folic acid (FOLVITE) 800 MCG tablet Take 400 mcg by mouth daily.    Marland Kitchen lubiprostone (AMITIZA) 24 MCG capsule Take 1 capsule (24 mcg total) by mouth 2 (two) times daily with a meal. 180 capsule 3  . Multiple Vitamins-Minerals (MULTIVITAMIN WITH MINERALS) tablet Take 1 tablet by mouth daily.      . Omega-3 Fatty Acids (OMEGA-3 FISH OIL PO) Take by mouth.      . ondansetron (ZOFRAN) 4 MG tablet Take 1 tablet (4 mg total) by mouth every 8 (eight) hours as needed for nausea or vomiting. 20 tablet 0  . OVER THE COUNTER MEDICATION Take 1 tablet by mouth 2 (two) times daily. BAYER BACK AND BODY WITH CAFFEINE1000-650    . pravastatin (PRAVACHOL) 40 MG tablet Take 1 tablet (40 mg total) by mouth daily. 90 tablet 3  . traZODone (DESYREL) 50 MG tablet Take 1 tablet (50 mg total) by mouth at bedtime. 90 tablet 3  . CALCIUM-MAGNESIUM-ZINC PO Take 1 capsule by mouth daily. Reported on  11/23/2015    . esomeprazole (NEXIUM) 20 MG packet Take 20 mg by mouth daily before breakfast. (Patient not taking: Reported on 10/10/2015) 30 each 2  . ciprofloxacin (CIPRO) 250 MG tablet Take 1 tablet (250 mg total) by mouth 2 (two) times daily. (Patient not taking: Reported on 10/10/2015) 8 tablet 0   No facility-administered medications prior to visit.    ROS Review of Systems  Constitutional: Positive for fever and fatigue. Negative for chills, activity change, appetite change and unexpected weight change.  HENT: Positive for sore throat. Negative for congestion, mouth sores, sinus pressure and trouble swallowing.   Eyes: Negative for visual disturbance.  Respiratory: Negative for cough and chest tightness.   Gastrointestinal: Negative for nausea and abdominal pain.  Genitourinary: Negative for frequency, difficulty urinating and vaginal pain.  Musculoskeletal: Negative for back pain and gait problem.  Skin: Negative for pallor and rash.  Neurological: Negative for dizziness, tremors, weakness, numbness and headaches.  Psychiatric/Behavioral: Negative for suicidal ideas, confusion and sleep disturbance.    Objective:  BP 108/78 mmHg  Pulse 74  Temp(Src) 97.4 F (36.3 C) (Oral)  Ht 5\' 1"  (1.549 m)  Wt 126 lb (57.153 kg)  BMI 23.82  kg/m2  SpO2 95%  BP Readings from Last 3 Encounters:  11/23/15 108/78  10/10/15 102/60  10/05/15 150/88    Wt Readings from Last 3 Encounters:  11/23/15 126 lb (57.153 kg)  10/10/15 130 lb (58.968 kg)  10/05/15 128 lb (58.06 kg)    Physical Exam  Constitutional: She appears well-developed. No distress.  HENT:  Head: Normocephalic.  Right Ear: External ear normal.  Left Ear: External ear normal.  Nose: Nose normal.  Eyes: Conjunctivae are normal. Pupils are equal, round, and reactive to light. Right eye exhibits no discharge. Left eye exhibits no discharge.  Neck: Normal range of motion. Neck supple. No JVD present. No tracheal deviation  present. No thyromegaly present.  Cardiovascular: Normal rate, regular rhythm and normal heart sounds.   Pulmonary/Chest: No stridor. No respiratory distress. She has no wheezes. She exhibits no tenderness.  Abdominal: Soft. Bowel sounds are normal. She exhibits no distension and no mass. There is no tenderness. There is no rebound and no guarding.  Musculoskeletal: She exhibits no edema or tenderness.  Lymphadenopathy:    She has no cervical adenopathy.  Neurological: She displays normal reflexes. No cranial nerve deficit. She exhibits normal muscle tone. Coordination normal.  Skin: Rash noted. No erythema.  Psychiatric: She has a normal mood and affect. Her behavior is normal. Judgment and thought content normal.  a patch of rash 15 mm on R buttock  Lab Results  Component Value Date   WBC 5.2 09/12/2015   HGB 13.9 09/12/2015   HCT 41.2 09/12/2015   PLT 221.0 09/12/2015   GLUCOSE 96 10/09/2015   CHOL 248* 10/09/2015   TRIG 98.0 10/09/2015   HDL 69.50 10/09/2015   LDLCALC 159* 10/09/2015   ALT 14 10/09/2015   AST 16 10/09/2015   NA 142 10/09/2015   K 3.9 10/09/2015   CL 104 10/09/2015   CREATININE 0.64 10/09/2015   BUN 13 10/09/2015   CO2 31 10/09/2015   TSH 3.28 09/12/2015    US Abdomen Complete  11/09/2015  CLINICAL DATA:  70 year old female with abdominal pain and nausea for 6 months. EXAM: ABDOMEN ULTRASOUND COMPLETE COMPARISON:  01/14/2011 CT FINDINGS: Gallbladder: The gallbladder is unremarkable. There is no evidence of cholelithiasis or acute cholecystitis. Common bile duct: Diameter: 2.8 mm. There is no evidence of intrahepatic or extrahepatic biliary dilatation. Liver: Unremarkable except for calcified granulomas. IVC: No abnormality visualized. Pancreas: Visualized portion unremarkable. Spleen: Unremarkable except for calcified granulomas. Right Kidney: Length: 10.8 cm. Echogenicity within normal limits. No mass or hydronephrosis visualized. Left Kidney: Length: 11.2 cm.  Echogenicity within normal limits. No mass or hydronephrosis visualized. Abdominal aorta: No aneurysm visualized. Other findings: None. IMPRESSION: Normal abdominal ultrasound except for hepatic and splenic calcified granulomas. Electronically Signed   By: Harmon Pier M.D.   On: 11/09/2015 09:29    Assessment & Plan:   There are no diagnoses linked to this encounter. I have discontinued Shannon Sutton's ciprofloxacin. I am also having her maintain her Vitamin D3, SUPER B COMPLEX, multivitamin with minerals, Omega-3 Fatty Acids (OMEGA-3 FISH OIL PO), CALCIUM-MAGNESIUM-ZINC PO, folic acid, OVER THE COUNTER MEDICATION, traZODone, lubiprostone, clonazePAM, diclofenac, ondansetron, esomeprazole, pravastatin, FLUoxetine, aspirin EC, and benzonatate.  No orders of the defined types were placed in this encounter.     Follow-up: No Follow-up on file.  Sonda Primes, MD

## 2015-11-24 ENCOUNTER — Encounter: Payer: Self-pay | Admitting: Internal Medicine

## 2015-11-24 LAB — HEPATITIS C ANTIBODY: HCV Ab: NEGATIVE

## 2015-12-21 ENCOUNTER — Encounter: Payer: Self-pay | Admitting: Internal Medicine

## 2015-12-23 ENCOUNTER — Other Ambulatory Visit: Payer: Self-pay | Admitting: Internal Medicine

## 2015-12-23 DIAGNOSIS — R251 Tremor, unspecified: Secondary | ICD-10-CM

## 2015-12-28 ENCOUNTER — Other Ambulatory Visit: Payer: Self-pay | Admitting: Internal Medicine

## 2016-01-07 ENCOUNTER — Ambulatory Visit (INDEPENDENT_AMBULATORY_CARE_PROVIDER_SITE_OTHER): Payer: Medicare HMO | Admitting: Neurology

## 2016-01-07 ENCOUNTER — Encounter: Payer: Self-pay | Admitting: Neurology

## 2016-01-07 ENCOUNTER — Other Ambulatory Visit: Payer: Medicare HMO

## 2016-01-07 VITALS — BP 138/76 | HR 72 | Ht 61.0 in | Wt 125.0 lb

## 2016-01-07 DIAGNOSIS — R531 Weakness: Secondary | ICD-10-CM

## 2016-01-07 DIAGNOSIS — R251 Tremor, unspecified: Secondary | ICD-10-CM

## 2016-01-07 NOTE — Progress Notes (Signed)
Shannon Sutton was seen today in the movement disorders clinic for neurologic consultation at the request of Walker Kehr, MD.  The consultation is for the evaluation of tremor and possible parkinsons disease.   Pt reports that sx's have been going on for 3 months.  Tremor is mostly in the L hand, and mostly at rest.  She is R hand dominant and states that she will have some tremor there as well, but mostly when she is trying to do something specific like writing.  She has noted micrographia.    Specific Symptoms:  Tremor: Yes.   Family hx of similar:  granfather (Paternal) with PD; maternal aunt with PD Voice: more hoarse Sleep: trouble getting to sleep and trouble staying asleep; has RLS and takes klonopin for that (has restlessness in arms and legs - takes 1-2 tablets of klonopin at night)  Vivid Dreams:  No.  Acting out dreams:  No. Wet Pillows: No. Postural symptoms:  Yes.   , does yoga two times a week without a problem but has orthopedic in feet and wonders if that contributes  Falls?  Yes.   (recently tried to sit in recliner with the leg still up and tried to straddle it and fell) Bradykinesia symptoms: some tripping; denies shuffling; no trouble getting out of car/low chair Loss of smell:  Yes.   Loss of taste:  Yes.   Urinary Incontinence:  No. (better after bladder suspension but does have urgency) Difficulty Swallowing:  No. Handwriting, micrographia: Yes.   Trouble with ADL's:  No.  Trouble buttoning clothing: No. Depression:  Yes.   Memory changes:  Yes.   (will put things down and forget them but no trouble remembering conversations) Hallucinations:  No.  visual distortions: No. (does have floaters) N/V:  No. (some intermittent nausea) Lightheaded:  Yes.   (when walks into bright light)  Syncope: No. Diplopia:  No. Dyskinesia:  No.  Neuroimaging has not previously been performed.    PREVIOUS MEDICATIONS: none to date  ALLERGIES:   Allergies  Allergen  Reactions  . Crestor [Rosuvastatin Calcium]     abd pain  . Cymbalta [Duloxetine Hcl]     Nausea   . Morphine And Related Itching    agitation  . Promethazine-Codeine Itching    Itching in face    CURRENT MEDICATIONS:  Outpatient Encounter Prescriptions as of 01/07/2016  Medication Sig  . aspirin EC 81 MG tablet Take 81 mg by mouth daily.  . B Complex-C (SUPER B COMPLEX) TABS Take 1 tablet by mouth daily.   . Cholecalciferol (VITAMIN D3) 2000 UNITS TABS Take 1 tablet by mouth daily.    . clonazePAM (KLONOPIN) 0.5 MG tablet Take 1-2 tablets (0.5-1 mg total) by mouth at bedtime as needed.  Marland Kitchen FLUoxetine (PROZAC) 40 MG capsule TAKE ONE CAPSULE BY MOUTH ONCE DAILY  . folic acid (FOLVITE) [id number] MCG tablet Take 400 mcg by mouth daily.  Marland Kitchen lubiprostone (AMITIZA) 24 MCG capsule Take 1 capsule (24 mcg total) by mouth 2 (two) times daily with a meal.  . Multiple Vitamins-Minerals (MULTIVITAMIN WITH MINERALS) tablet Take 1 tablet by mouth daily.    . Omega-3 Fatty Acids (OMEGA-3 FISH OIL PO) Take by mouth.    Marland Kitchen OVER THE COUNTER MEDICATION Take 1 tablet by mouth 2 (two) times daily. BAYER BACK AND BODY WITH CAFFEINE1000-650  . pravastatin (PRAVACHOL) 40 MG tablet Take 1 tablet (40 mg total) by mouth daily.  . traZODone (DESYREL) 50 MG tablet Take  1 tablet (50 mg total) by mouth at bedtime.  . [DISCONTINUED] azithromycin (ZITHROMAX) 250 MG tablet As directed  . [DISCONTINUED] benzonatate (TESSALON) 200 MG capsule Take 1 capsule (200 mg total) by mouth 3 (three) times daily as needed for cough.  . [DISCONTINUED] CALCIUM-MAGNESIUM-ZINC PO Take 1 capsule by mouth daily. Reported on 11/23/2015  . [DISCONTINUED] diclofenac (VOLTAREN) 75 MG EC tablet Take 1 tablet (75 mg total) by mouth 2 (two) times daily.  . [DISCONTINUED] esomeprazole (NEXIUM) 20 MG packet Take 20 mg by mouth daily before breakfast. (Patient not taking: Reported on 10/10/2015)  . [DISCONTINUED] FLUoxetine (PROZAC) 40 MG capsule Take 1  capsule (40 mg total) by mouth daily.  . [DISCONTINUED] ondansetron (ZOFRAN) 4 MG tablet Take 1 tablet (4 mg total) by mouth every 8 (eight) hours as needed for nausea or vomiting.   No facility-administered encounter medications on file as of 01/07/2016.    PAST MEDICAL HISTORY:   Past Medical History  Diagnosis Date  . Diverticulosis   . Diverticulitis   . Adenomatous colon polyp 1990  . External hemorrhoids   . Myalgia and myositis, unspecified   . Other disorder of muscle, ligament, and fascia   . Pain in joint, multiple sites   . Depression 11/16/2012  . 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 Fibromyalgia with myofascial pain 11/16/2012  . Insomnia 11/16/2012  . Arthritis   . Headaches, cluster   . Allergy   . Hyperlipidemia   . Urine incontinence   . Anxiety 12/15/2013  . Hypercholesterolemia 12/15/2013  . Restless leg syndrome 12/15/2013  . Hypertension   . Unspecified sleep apnea   . Other specified disease of hair and hair follicles   . Chronic constipation     PAST SURGICAL HISTORY:   Past Surgical History  Procedure Laterality Date  . Breast reduction surgery  1995  . Total abdominal hysterectomy  1999  . Abdominoplasty  2006    "tummy tuck"  . Breast surgery  1981    left breast biopsy  . Muscle biopsy  2009    DEEP TISSUE  . Tonsillectomy    . Wisdom tooth extraction      SOCIAL HISTORY:   Social History   Social History  . Marital Status: Divorced    Spouse Name: N/A  . Number of Children: 2  . Years of Education: N/A   Occupational History  . Retired    Social History Main Topics  . Smoking status: Former Smoker    Quit date: 12/19/1986  . Smokeless tobacco: Never Used  . Alcohol Use: 0.6 oz/week    1 Glasses of wine per week     Comment: ONCE OR TWICE A YEAR WILL HAVE WINE  . Drug Use: No  . Sexual Activity: Yes    Birth Control/ Protection: Surgical, Post-menopausal   Other Topics Concern  . Not on file   Social History Narrative    FAMILY  HISTORY:   Family Status  Relation Status Death Age  . Father Deceased     colon cancer, heart disease  . Mother Deceased     heart disease  . Sister Deceased     COPD, emphysema  . Brother Deceased     COPD, lung cancer  . Daughter Alive     healthy  . Daughter Alive     healthy    ROS:  Has generalized weakness and fatigue.  No lateralizing weakness or paresthesias.   A complete 10 system review of systems was  obtained and was unremarkable apart from what is mentioned above.  PHYSICAL EXAMINATION:    VITALS:   Filed Vitals:   01/07/16 1239  BP: 138/76  Pulse: 72  Height: 5\' 1"  (1.549 m)  Weight: 125 lb (56.7 kg)    GEN:  The patient appears stated age and is in NAD. HEENT:  Normocephalic, atraumatic.  The mucous membranes are moist. The superficial temporal arteries are without ropiness or tenderness. CV:  RRR Lungs:  CTAB Neck/HEME:  There are no carotid bruits bilaterally.  Neurological examination:  Orientation: The patient is alert and oriented x3. Fund of knowledge is appropriate.  Recent and remote memory are intact.  Attention and concentration are normal.    Able to name objects and repeat phrases. Cranial nerves: There is good facial symmetry. Pupils are equal round and reactive to light bilaterally. Fundoscopic exam reveals clear margins bilaterally. Extraocular muscles are intact. The visual fields are full to confrontational testing. The speech is fluent and clear. Soft palate rises symmetrically and there is no tongue deviation. Hearing is intact to conversational tone. Sensation: Sensation is intact to light and pinprick throughout (facial, trunk, extremities). Vibration is decreased at the bilateral big toe. There is no extinction with double simultaneous stimulation. There is no sensory dermatomal level identified. Motor: Strength is 5/5 in the bilateral upper and lower extremities.   Shoulder shrug is equal and symmetric.  There is no pronator drift. Deep  tendon reflexes: Deep tendon reflexes are 2/4 at the bilateral biceps, triceps, brachioradialis, patella and achilles. Plantar responses are downgoing bilaterally.  Movement examination: Tone: There is normal tone in the bilateral upper extremities.  The tone in the lower extremities is normal.  Abnormal movements: none even with distraction procedures.  No resting tremor.  No tremor with posture or intention.  No trouble with archimedes spirals.  No trouble with pouring water from one glass to another Coordination:  There is minimal decremation with RAM's, with toe taps on the L.  All other RAMs were normal. Gait and Station: The patient has no difficulty arising out of a deep-seated chair without the use of the hands. The patient's stride length is normal; she is able to walk in a tandem fashion with no trouble.  She is able to heel toe walk.  The patient has a negative pull test.      LABS  Lab Results  Component Value Date   TSH 3.28 09/12/2015     Chemistry      Component Value Date/Time   NA 142 10/09/2015 1034   NA 140 09/19/2013   K 3.9 10/09/2015 1034   CL 104 10/09/2015 1034   CO2 31 10/09/2015 1034   BUN 13 10/09/2015 1034   BUN 11 09/19/2013   CREATININE 0.64 10/09/2015 1034   CREATININE 0.6 09/19/2013   GLU 90 09/19/2013      Component Value Date/Time   CALCIUM 9.2 10/09/2015 1034   ALKPHOS 54 11/23/2015 1351   AST 19 11/23/2015 1351   ALT 20 11/23/2015 1351   BILITOT 0.6 11/23/2015 1351     Lab Results  Component Value Date   VITAMINB12 647 09/12/2015    Lab Results  Component Value Date   ESRSEDRATE 6 09/12/2015    ASSESSMENT/PLAN:  1.  Intermittent tremor (per pt hx)  -I had a long discussion with the patient today.  I saw no evidence of any type of parkinsonian disorder.  She had no bradykinesia, no rigidity, no gait instability and I  saw no tremor today.  I understand that her tremor can be somewhat intermittent.  She appeared to be somewhat  frustrated as she thought she had a parkinsonian syndrome, but I tried to provide reassurance for her that this was a good thing.  I also told her that this did not mean that Parkinson's could not develop in the future, but she certainly did not have it today.  Her neurologic exam today was nonfocal and nonlateralizing.  I will check acetylcholine receptor antibodies as she does report some waxing and waning of symptoms, including waxing and waning of diffuse intermittent weakness, although I do not think that this can necessarily explain all of her complaints.  I will also check her copper and ceruloplasmin.  We will call her with the results of the above.  I am happy to see her back in the future should her primary care physician feel that she needs neurologic follow-up or should new neurologic symptoms develop.  Total visit time was 45 min with greater than 50% in counseling.

## 2016-01-07 NOTE — Patient Instructions (Signed)
1. Your provider has requested that you have labwork completed today. Please go to [facility] ([id number]) on the second floor of this building before leaving the office today. You do not need to check in. If you are not called within 15 minutes please check with the front desk.   

## 2016-01-09 LAB — CERULOPLASMIN: Ceruloplasmin: 26 mg/dL (ref 18–53)

## 2016-01-11 LAB — COPPER, SERUM: Copper: 118 ug/dL (ref 72–166)

## 2016-01-12 LAB — MYASTHENIA GRAVIS PANEL 2: Acetylcholine Rec Mod Ab: 10 % binding inhibition

## 2016-03-28 ENCOUNTER — Other Ambulatory Visit: Payer: Self-pay | Admitting: Internal Medicine

## 2016-04-09 ENCOUNTER — Telehealth: Payer: Self-pay

## 2016-04-09 NOTE — Telephone Encounter (Signed)
Patient is on the list for [facility] 2017 and may be a good candidate for an AWV in 2017. Please let me know if/when appt is scheduled.   

## 2016-04-11 ENCOUNTER — Other Ambulatory Visit: Payer: Self-pay | Admitting: Internal Medicine

## 2016-04-14 ENCOUNTER — Other Ambulatory Visit: Payer: Self-pay | Admitting: Internal Medicine

## 2016-04-14 NOTE — Telephone Encounter (Signed)
Rx was printed out MD is put of office this week. Called refill into [facility] had to leave on pharmacy vm...Johny Chess

## 2016-04-14 NOTE — Telephone Encounter (Signed)
rx faxed to local pharmacy.

## 2016-05-29 ENCOUNTER — Ambulatory Visit (INDEPENDENT_AMBULATORY_CARE_PROVIDER_SITE_OTHER): Payer: Medicare HMO | Admitting: Internal Medicine

## 2016-05-29 ENCOUNTER — Encounter: Payer: Self-pay | Admitting: Internal Medicine

## 2016-05-29 ENCOUNTER — Other Ambulatory Visit (INDEPENDENT_AMBULATORY_CARE_PROVIDER_SITE_OTHER): Payer: Medicare HMO

## 2016-05-29 VITALS — BP 128/80 | HR 76 | Temp 98.3°F | Wt 125.0 lb

## 2016-05-29 DIAGNOSIS — K5901 Slow transit constipation: Secondary | ICD-10-CM | POA: Diagnosis not present

## 2016-05-29 DIAGNOSIS — R5382 Chronic fatigue, unspecified: Secondary | ICD-10-CM

## 2016-05-29 DIAGNOSIS — K5900 Constipation, unspecified: Secondary | ICD-10-CM | POA: Insufficient documentation

## 2016-05-29 DIAGNOSIS — R1010 Upper abdominal pain, unspecified: Secondary | ICD-10-CM

## 2016-05-29 DIAGNOSIS — R202 Paresthesia of skin: Secondary | ICD-10-CM

## 2016-05-29 DIAGNOSIS — Z0000 Encounter for general adult medical examination without abnormal findings: Secondary | ICD-10-CM | POA: Diagnosis not present

## 2016-05-29 DIAGNOSIS — M797 Fibromyalgia: Secondary | ICD-10-CM | POA: Diagnosis not present

## 2016-05-29 LAB — URINALYSIS
Bilirubin Urine: NEGATIVE
Hgb urine dipstick: NEGATIVE
Ketones, ur: NEGATIVE
LEUKOCYTES UA: NEGATIVE
Nitrite: NEGATIVE
Total Protein, Urine: NEGATIVE
UROBILINOGEN UA: 0.2 (ref 0.0–1.0)
Urine Glucose: NEGATIVE
pH: 5.5 (ref 5.0–8.0)

## 2016-05-29 LAB — CBC WITH DIFFERENTIAL/PLATELET
BASOS PCT: 0.4 % (ref 0.0–3.0)
Basophils Absolute: 0 10*3/uL (ref 0.0–0.1)
EOS ABS: 0.2 10*3/uL (ref 0.0–0.7)
Eosinophils Relative: 3.9 % (ref 0.0–5.0)
HCT: 39.3 % (ref 36.0–46.0)
HEMOGLOBIN: 13.6 g/dL (ref 12.0–15.0)
LYMPHS ABS: 1.8 10*3/uL (ref 0.7–4.0)
Lymphocytes Relative: 34.3 % (ref 12.0–46.0)
MCHC: 34.6 g/dL (ref 30.0–36.0)
MCV: 91.7 fl (ref 78.0–100.0)
MONO ABS: 0.4 10*3/uL (ref 0.1–1.0)
Monocytes Relative: 7.4 % (ref 3.0–12.0)
NEUTROS PCT: 54 % (ref 43.0–77.0)
Neutro Abs: 2.8 10*3/uL (ref 1.4–7.7)
Platelets: 239 10*3/uL (ref 150.0–400.0)
RBC: 4.29 Mil/uL (ref 3.87–5.11)
RDW: 12.9 % (ref 11.5–15.5)
WBC: 5.2 10*3/uL (ref 4.0–10.5)

## 2016-05-29 LAB — BASIC METABOLIC PANEL
BUN: 11 mg/dL (ref 6–23)
CALCIUM: 9.5 mg/dL (ref 8.4–10.5)
CO2: 28 meq/L (ref 19–32)
Chloride: 101 mEq/L (ref 96–112)
Creatinine, Ser: 0.61 mg/dL (ref 0.40–1.20)
GFR: 102.37 mL/min (ref >60.00)
GLUCOSE: 83 mg/dL (ref 70–99)
POTASSIUM: 4.2 meq/L (ref 3.5–5.1)
SODIUM: 137 meq/L (ref 135–145)

## 2016-05-29 LAB — HEPATIC FUNCTION PANEL
ALK PHOS: 55 U/L (ref 39–117)
ALT: 12 U/L (ref 0–35)
AST: 15 U/L (ref 0–37)
Albumin: 4.5 g/dL (ref 3.5–5.2)
BILIRUBIN DIRECT: 0.1 mg/dL (ref 0.0–0.3)
TOTAL PROTEIN: 7.1 g/dL (ref 6.0–8.3)
Total Bilirubin: 0.4 mg/dL (ref 0.2–1.2)

## 2016-05-29 LAB — CK: Total CK: 38 U/L (ref 7–177)

## 2016-05-29 LAB — TSH: TSH: 1.72 u[IU]/mL (ref 0.35–4.50)

## 2016-05-29 LAB — CORTISOL: CORTISOL PLASMA: 14 ug/dL

## 2016-05-29 LAB — VITAMIN B12: VITAMIN B 12: 837 pg/mL (ref 211–911)

## 2016-05-29 LAB — MAGNESIUM: MAGNESIUM: 2.1 mg/dL (ref 1.5–2.5)

## 2016-05-29 LAB — SEDIMENTATION RATE: Sed Rate: 2 mm/hr (ref 0–30)

## 2016-05-29 MED ORDER — LINACLOTIDE 145 MCG PO CAPS: 145.0000 ug | ORAL_CAPSULE | Freq: Every day | ORAL | 11 refills | Status: DC

## 2016-05-29 NOTE — Progress Notes (Signed)
Subjective:  Patient ID: Shannon Sutton, female    DOB: Mar 19, 1946  Age: 70 y.o. MRN: [id number]  CC: Abdominal Pain (epigastric x weeks, with nausea, not related to meals); Dizziness; Headache; and Bleeding/Bruising   HPI Shannon Sutton presents for shakiness, fatigue x 5 mo. C/o epig pain x 4 weeks. All are getting worse. C/o constipation and bloating.  Outpatient Medications Prior to Visit  Medication Sig Dispense Refill  . AMITIZA 24 MCG capsule TAKE ONE CAPSULE BY MOUTH TWICE DAILY WITH A MEAL 180 capsule 1  . aspirin EC 81 MG tablet Take 81 mg by mouth daily.    . B Complex-C (SUPER B COMPLEX) TABS Take 1 tablet by mouth daily.     . Cholecalciferol (VITAMIN D3) 2000 UNITS TABS Take 1 tablet by mouth daily.      . clonazePAM (KLONOPIN) 0.5 MG tablet TAKE ONE TO TWO TABLETS BY MOUTH AT BEDTIME AS NEEDED 60 tablet 0  . FLUoxetine (PROZAC) 40 MG capsule TAKE ONE CAPSULE BY MOUTH ONCE DAILY 90 capsule 3  . folic acid (FOLVITE) 800 MCG tablet Take 400 mcg by mouth daily.    . Multiple Vitamins-Minerals (MULTIVITAMIN WITH MINERALS) tablet Take 1 tablet by mouth daily.      . Omega-3 Fatty Acids (OMEGA-3 FISH OIL PO) Take by mouth.      Marland Kitchen OVER THE COUNTER MEDICATION Take 1 tablet by mouth 2 (two) times daily. BAYER BACK AND BODY WITH CAFFEINE1000-650    . pravastatin (PRAVACHOL) 40 MG tablet Take 1 tablet (40 mg total) by mouth daily. 90 tablet 3  . traZODone (DESYREL) 50 MG tablet Take 1 tablet (50 mg total) by mouth at bedtime. 90 tablet 3   No facility-administered medications prior to visit.     ROS Review of Systems  Constitutional: Positive for fatigue. Negative for activity change, appetite change, chills and unexpected weight change.  HENT: Negative for congestion, mouth sores and sinus pressure.   Eyes: Negative for visual disturbance.  Respiratory: Negative for cough and chest tightness.   Gastrointestinal: Positive for abdominal distention, abdominal pain and  constipation. Negative for nausea.  Genitourinary: Negative for difficulty urinating, frequency and vaginal pain.  Musculoskeletal: Negative for back pain and gait problem.  Skin: Negative for pallor and rash.  Neurological: Positive for dizziness and weakness. Negative for tremors, numbness and headaches.  Psychiatric/Behavioral: Positive for sleep disturbance. Negative for confusion. The patient is not nervous/anxious.     Objective:  BP 128/80   Pulse 76   Temp 98.3 F (36.8 C) (Oral)   Wt 125 lb (56.7 kg)   SpO2 98%   BMI 23.62 kg/m   BP Readings from Last 3 Encounters:  05/29/16 128/80  01/07/16 138/76  11/23/15 108/78    Wt Readings from Last 3 Encounters:  05/29/16 125 lb (56.7 kg)  01/07/16 125 lb (56.7 kg)  11/23/15 126 lb (57.2 kg)    Physical Exam  Constitutional: She appears well-developed. No distress.  HENT:  Head: Normocephalic.  Right Ear: External ear normal.  Left Ear: External ear normal.  Nose: Nose normal.  Mouth/Throat: Oropharynx is clear and moist.  Eyes: Conjunctivae are normal. Pupils are equal, round, and reactive to light. Right eye exhibits no discharge. Left eye exhibits no discharge.  Neck: Normal range of motion. Neck supple. No JVD present. No tracheal deviation present. No thyromegaly present.  Cardiovascular: Normal rate, regular rhythm and normal heart sounds.   Pulmonary/Chest: No stridor. No respiratory distress. She has  no wheezes.  Abdominal: Soft. Bowel sounds are normal. She exhibits no distension and no mass. There is no tenderness. There is no rebound and no guarding.  Musculoskeletal: She exhibits no edema or tenderness.  Lymphadenopathy:    She has no cervical adenopathy.  Neurological: She displays normal reflexes. No cranial nerve deficit. She exhibits normal muscle tone. Coordination normal.  Skin: No rash noted. No erythema.  Psychiatric: She has a normal mood and affect. Her behavior is normal. Judgment and thought  content normal.    Lab Results  Component Value Date   WBC 5.2 09/12/2015   HGB 13.9 09/12/2015   HCT 41.2 09/12/2015   PLT 221.0 09/12/2015   GLUCOSE 96 10/09/2015   CHOL 210 (H) 11/23/2015   TRIG 52.0 11/23/2015   HDL 75.20 11/23/2015   LDLCALC 124 (H) 11/23/2015   ALT 20 11/23/2015   AST 19 11/23/2015   NA 142 10/09/2015   K 3.9 10/09/2015   CL 104 10/09/2015   CREATININE 0.64 10/09/2015   BUN 13 10/09/2015   CO2 31 10/09/2015   TSH 3.28 09/12/2015    US Abdomen Complete  Result Date: 11/09/2015 CLINICAL DATA:  70 year old female with abdominal pain and nausea for 6 months. EXAM: ABDOMEN ULTRASOUND COMPLETE COMPARISON:  01/14/2011 CT FINDINGS: Gallbladder: The gallbladder is unremarkable. There is no evidence of cholelithiasis or acute cholecystitis. Common bile duct: Diameter: 2.8 mm. There is no evidence of intrahepatic or extrahepatic biliary dilatation. Liver: Unremarkable except for calcified granulomas. IVC: No abnormality visualized. Pancreas: Visualized portion unremarkable. Spleen: Unremarkable except for calcified granulomas. Right Kidney: Length: 10.8 cm. Echogenicity within normal limits. No mass or hydronephrosis visualized. Left Kidney: Length: 11.2 cm. Echogenicity within normal limits. No mass or hydronephrosis visualized. Abdominal aorta: No aneurysm visualized. Other findings: None. IMPRESSION: Normal abdominal ultrasound except for hepatic and splenic calcified granulomas. Electronically Signed   By: Harmon Pier M.D.   On: 11/09/2015 09:29    Assessment & Plan:   There are no diagnoses linked to this encounter. I am having Ms. Dayton Scrape maintain her Vitamin D3, SUPER B COMPLEX, multivitamin with minerals, Omega-3 Fatty Acids (OMEGA-3 FISH OIL PO), folic acid, OVER THE COUNTER MEDICATION, traZODone, pravastatin, aspirin EC, FLUoxetine, AMITIZA, and clonazePAM.  No orders of the defined types were placed in this encounter.    Follow-up: No Follow-up on  file.  Sonda Primes, MD

## 2016-05-29 NOTE — Assessment & Plan Note (Signed)
Labs Hold pravachol, MVI CT if not better

## 2016-05-29 NOTE — Assessment & Plan Note (Signed)
CFS - worse Labs Treat constipation CT abd if not better

## 2016-05-29 NOTE — Assessment & Plan Note (Signed)
Labs

## 2016-05-29 NOTE — Progress Notes (Signed)
Pre visit review using our clinic review tool, if applicable. No additional management support is needed unless otherwise documented below in the visit note. 

## 2016-05-29 NOTE — Assessment & Plan Note (Signed)
D/c Amitiza Linzess

## 2016-05-30 LAB — HEPATITIS C ANTIBODY: HCV Ab: NEGATIVE

## 2016-06-01 IMAGING — US US ABDOMEN COMPLETE
1 series · 14 of 25 positions shown · non-contrast
Comparison: 01/14/2011 CT

CLINICAL DATA: 69-year-old female with abdominal pain and nausea
for 6 months.

EXAM:
ABDOMEN ULTRASOUND COMPLETE

[Series 1: us abdomen complete · 0.21mm/px · 14 of 83 slices shown]
[im 1/83]
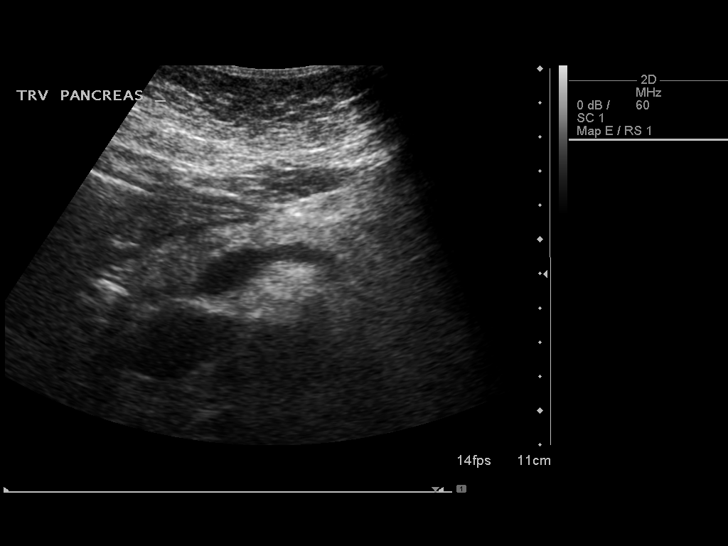
[im 7/83]
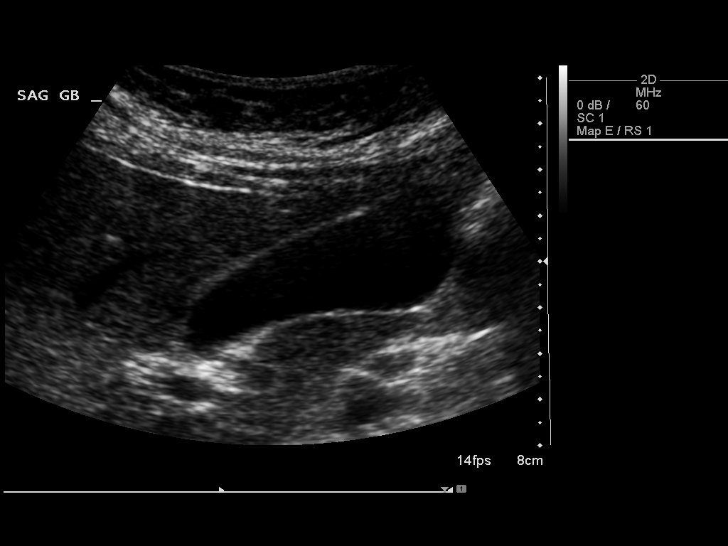
[im 14/83]
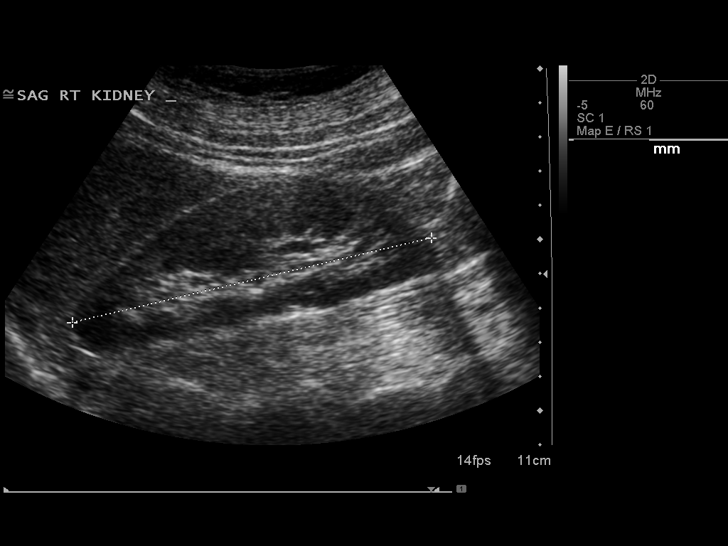
[im 21/83]
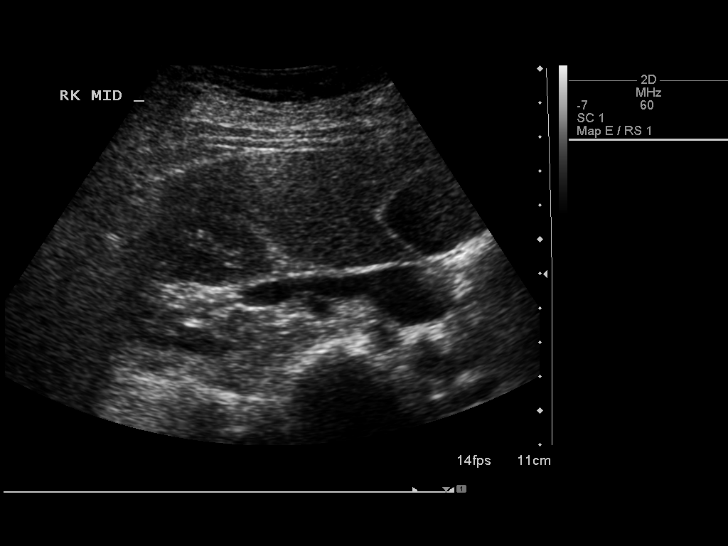
[im 28/83]
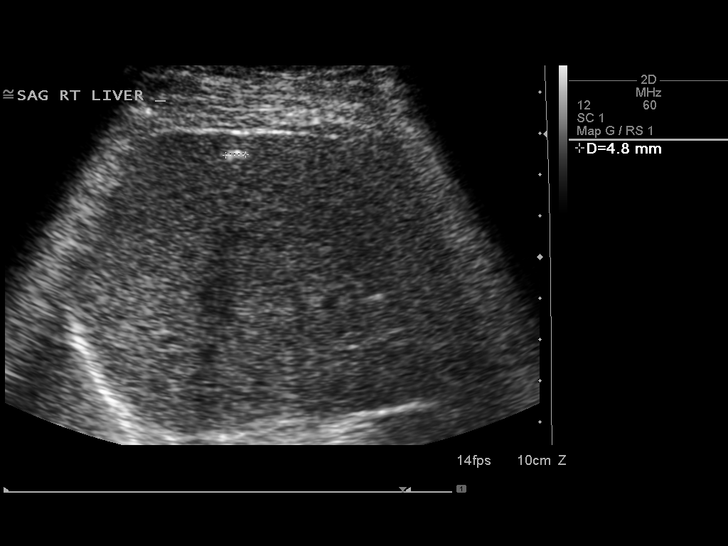
[im 31/83]
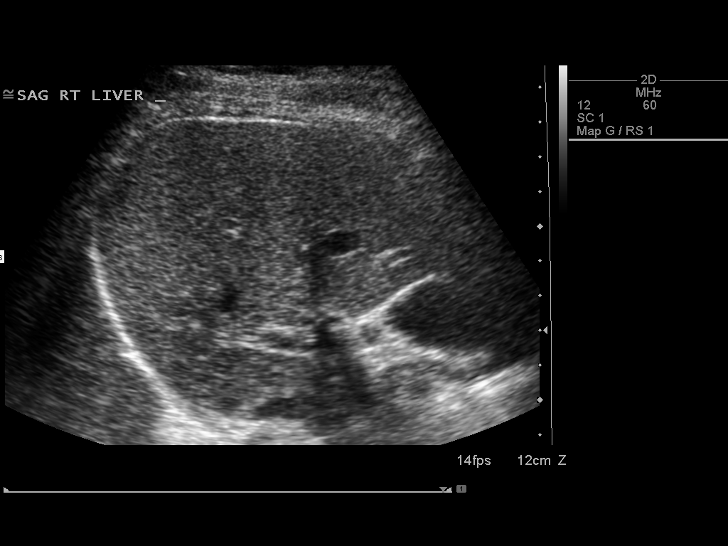
[im 38/83]
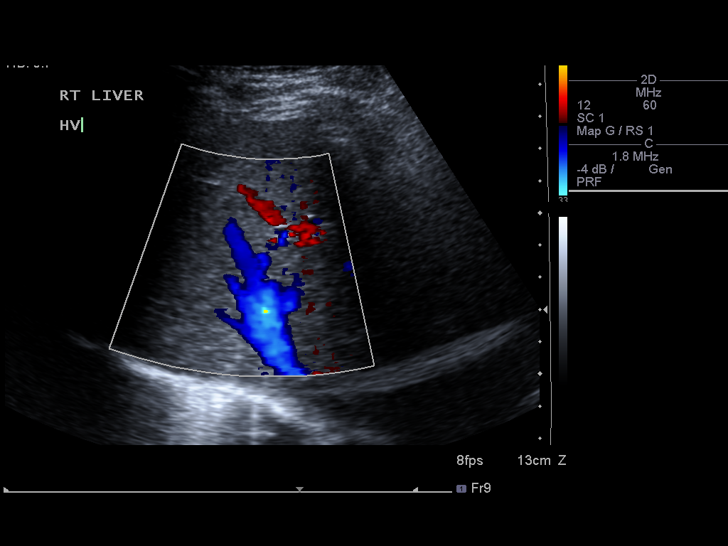
[im 45/83]
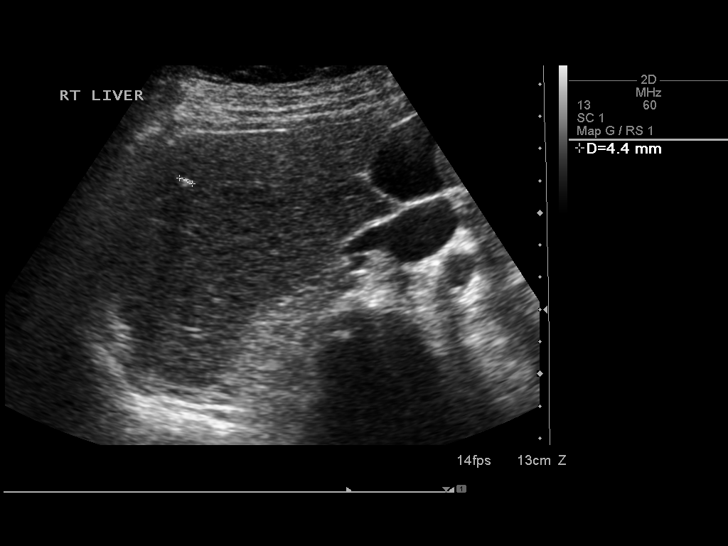
[im 52/83]
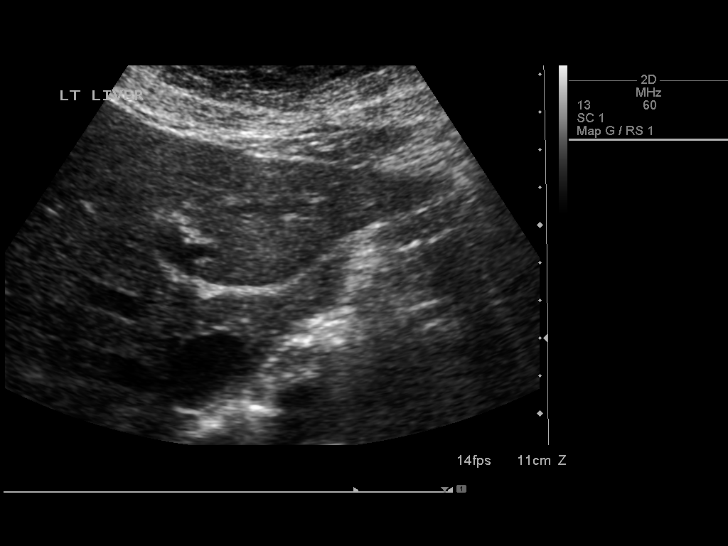
[im 55/83]
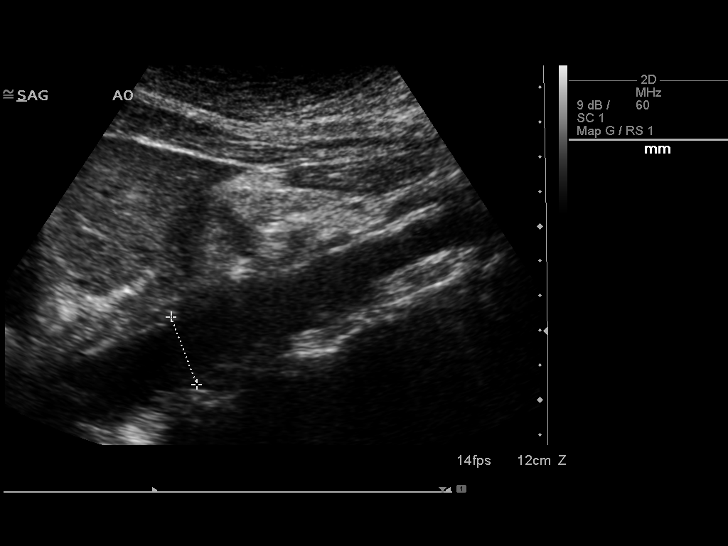
[im 62/83]
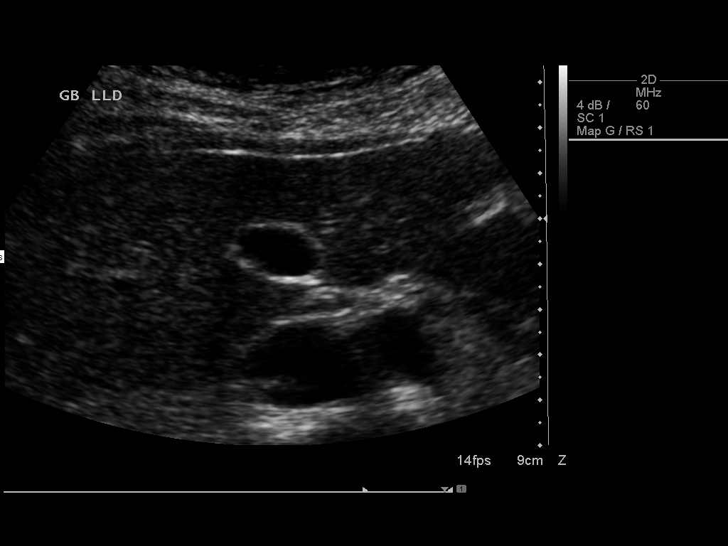
[im 69/83]
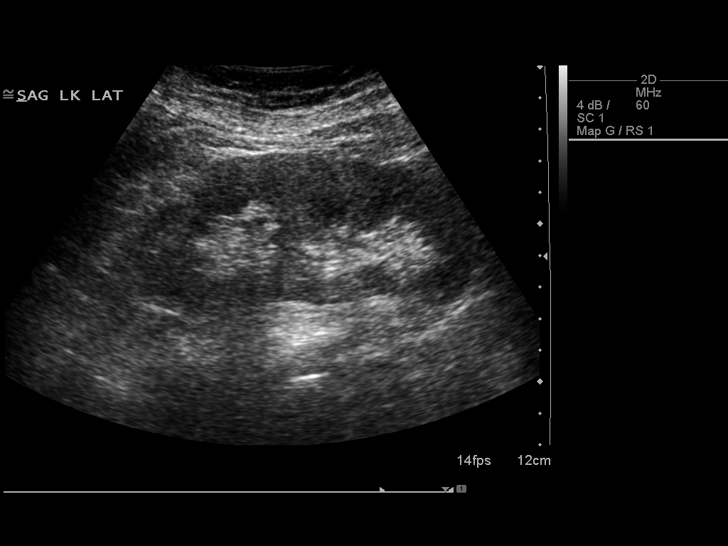
[im 76/83]
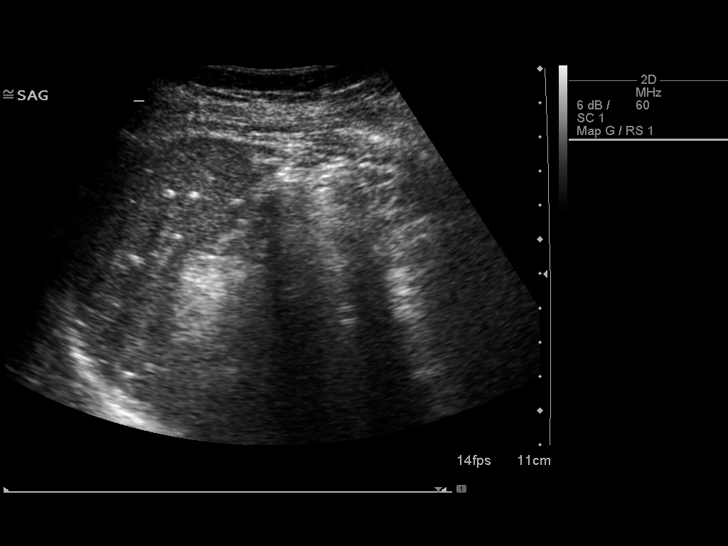
[im 83/83]
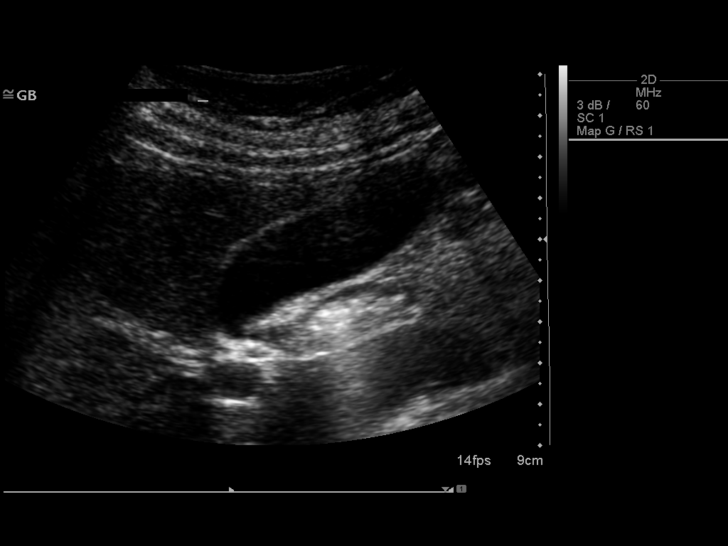

[14 of 25 positions shown; findings below may reference images not displayed]

FINDINGS: Gallbladder: The gallbladder is unremarkable. There is no evidence
of cholelithiasis or acute cholecystitis.

Common bile duct: Diameter: 2.8 mm. There is no evidence of
intrahepatic or extrahepatic biliary dilatation.

Liver: Unremarkable except for calcified granulomas.

IVC: No abnormality visualized.

Pancreas: Visualized portion unremarkable.

Spleen: Unremarkable except for calcified granulomas.

Right Kidney: Length: 10.8 cm. Echogenicity within normal limits. No
mass or hydronephrosis visualized.

Left Kidney: Length: 11.2 cm. Echogenicity within normal limits. No
mass or hydronephrosis visualized.

Abdominal aorta: No aneurysm visualized.

Other findings: None.
IMPRESSION: Normal abdominal ultrasound except for hepatic and splenic calcified
granulomas.

## 2016-06-18 DIAGNOSIS — H43811 Vitreous degeneration, right eye: Secondary | ICD-10-CM | POA: Diagnosis not present

## 2016-06-18 DIAGNOSIS — H16223 Keratoconjunctivitis sicca, not specified as Sjogren's, bilateral: Secondary | ICD-10-CM | POA: Diagnosis not present

## 2016-06-18 DIAGNOSIS — H35373 Puckering of macula, bilateral: Secondary | ICD-10-CM | POA: Diagnosis not present

## 2016-06-23 ENCOUNTER — Other Ambulatory Visit: Payer: Self-pay | Admitting: Internal Medicine

## 2016-06-23 DIAGNOSIS — Z1231 Encounter for screening mammogram for malignant neoplasm of breast: Secondary | ICD-10-CM

## 2016-06-25 ENCOUNTER — Encounter: Payer: Self-pay | Admitting: Internal Medicine

## 2016-06-25 ENCOUNTER — Ambulatory Visit (INDEPENDENT_AMBULATORY_CARE_PROVIDER_SITE_OTHER): Payer: Medicare HMO | Admitting: Internal Medicine

## 2016-06-25 DIAGNOSIS — Z23 Encounter for immunization: Secondary | ICD-10-CM | POA: Diagnosis not present

## 2016-06-25 DIAGNOSIS — F32A Depression, unspecified: Secondary | ICD-10-CM

## 2016-06-25 DIAGNOSIS — F329 Major depressive disorder, single episode, unspecified: Secondary | ICD-10-CM

## 2016-06-25 MED ORDER — DULOXETINE HCL 20 MG PO CPEP: 20.0000 mg | ORAL_CAPSULE | Freq: Every day | ORAL | 5 refills | Status: DC

## 2016-06-25 MED ORDER — LINACLOTIDE 145 MCG PO CAPS: 145.0000 ug | ORAL_CAPSULE | Freq: Every day | ORAL | 3 refills | Status: DC

## 2016-06-25 MED ORDER — FLUOXETINE HCL 40 MG PO CAPS: 40.0000 mg | ORAL_CAPSULE | ORAL | 3 refills | Status: DC

## 2016-06-25 NOTE — Assessment & Plan Note (Signed)
Reduce to qod Fluoxetine. Try a low dose Cymbalta again

## 2016-06-25 NOTE — Patient Instructions (Signed)
Try activated charcoal tablets or Gas-Ex for gas

## 2016-06-25 NOTE — Progress Notes (Signed)
Subjective:  Patient ID: Shannon Sutton, female    DOB: 04-20-1946  Age: 70 y.o. MRN: [id number]  CC: No chief complaint on file.   HPI Shannon Sutton presents for constipation - better on Linzess; abd pain and nausea is better off Pravastatin. C/o gas w/many foods she eats. C/o tension and anxiety. F/u depression and FMS  Outpatient Medications Prior to Visit  Medication Sig Dispense Refill  . aspirin EC 81 MG tablet Take 81 mg by mouth daily.    . B Complex-C (SUPER B COMPLEX) TABS Take 1 tablet by mouth daily.     . Cholecalciferol (VITAMIN D3) 2000 UNITS TABS Take 1 tablet by mouth daily.      . clonazePAM (KLONOPIN) 0.5 MG tablet TAKE ONE TO TWO TABLETS BY MOUTH AT BEDTIME AS NEEDED 60 tablet 0  . FLUoxetine (PROZAC) 40 MG capsule TAKE ONE CAPSULE BY MOUTH ONCE DAILY 90 capsule 3  . folic acid (FOLVITE) 800 MCG tablet Take 400 mcg by mouth daily.    Marland Kitchen linaclotide (LINZESS) 145 MCG CAPS capsule Take 1 capsule (145 mcg total) by mouth daily before breakfast. 30 capsule 11  . Omega-3 Fatty Acids (OMEGA-3 FISH OIL PO) Take by mouth.      Marland Kitchen OVER THE COUNTER MEDICATION Take 1 tablet by mouth 2 (two) times daily. BAYER BACK AND BODY WITH CAFFEINE1000-650    . traZODone (DESYREL) 50 MG tablet Take 1 tablet (50 mg total) by mouth at bedtime. 90 tablet 3  . Multiple Vitamins-Minerals (MULTIVITAMIN WITH MINERALS) tablet Take 1 tablet by mouth daily.      . pravastatin (PRAVACHOL) 40 MG tablet Take 1 tablet (40 mg total) by mouth daily. (Patient not taking: Reported on 06/25/2016) 90 tablet 3   No facility-administered medications prior to visit.     ROS Review of Systems  Constitutional: Positive for fatigue. Negative for activity change, appetite change, chills and unexpected weight change.  HENT: Negative for congestion, mouth sores and sinus pressure.   Eyes: Negative for visual disturbance.  Respiratory: Negative for cough and chest tightness.   Gastrointestinal: Negative for  abdominal pain and nausea.  Genitourinary: Negative for difficulty urinating, frequency and vaginal pain.  Musculoskeletal: Positive for back pain, joint swelling and myalgias. Negative for gait problem.  Skin: Negative for pallor and rash.  Neurological: Negative for dizziness, tremors, weakness, numbness and headaches.  Psychiatric/Behavioral: Positive for dysphoric mood. Negative for confusion and sleep disturbance. The patient is nervous/anxious.     Objective:  BP 130/82   Pulse 72   Temp 98.2 F (36.8 C) (Oral)   Wt 125 lb (56.7 kg)   SpO2 98%   BMI 23.62 kg/m   BP Readings from Last 3 Encounters:  06/25/16 130/82  05/29/16 128/80  01/07/16 138/76    Wt Readings from Last 3 Encounters:  06/25/16 125 lb (56.7 kg)  05/29/16 125 lb (56.7 kg)  01/07/16 125 lb (56.7 kg)    Physical Exam  Constitutional: She appears well-developed. No distress.  HENT:  Head: Normocephalic.  Right Ear: External ear normal.  Left Ear: External ear normal.  Nose: Nose normal.  Mouth/Throat: Oropharynx is clear and moist.  Eyes: Conjunctivae are normal. Pupils are equal, round, and reactive to light. Right eye exhibits no discharge. Left eye exhibits no discharge.  Neck: Normal range of motion. Neck supple. No JVD present. No tracheal deviation present. No thyromegaly present.  Cardiovascular: Normal rate, regular rhythm and normal heart sounds.   Pulmonary/Chest: No  stridor. No respiratory distress. She has no wheezes.  Abdominal: Soft. Bowel sounds are normal. She exhibits no distension and no mass. There is no tenderness. There is no rebound and no guarding.  Musculoskeletal: She exhibits no edema or tenderness.  Lymphadenopathy:    She has no cervical adenopathy.  Neurological: She displays normal reflexes. No cranial nerve deficit. She exhibits normal muscle tone. Coordination normal.  Skin: No rash noted. No erythema.  Psychiatric: She has a normal mood and affect. Her behavior is  normal. Judgment and thought content normal.    Lab Results  Component Value Date   WBC 5.2 05/29/2016   HGB 13.6 05/29/2016   HCT 39.3 05/29/2016   PLT 239.0 05/29/2016   GLUCOSE 83 05/29/2016   CHOL 210 (H) 11/23/2015   TRIG 52.0 11/23/2015   HDL 75.20 11/23/2015   LDLCALC 124 (H) 11/23/2015   ALT 12 05/29/2016   AST 15 05/29/2016   NA 137 05/29/2016   K 4.2 05/29/2016   CL 101 05/29/2016   CREATININE 0.61 05/29/2016   BUN 11 05/29/2016   CO2 28 05/29/2016   TSH 1.72 05/29/2016    US Abdomen Complete  Result Date: 11/09/2015 CLINICAL DATA:  69 year old female with abdominal pain and nausea for 6 months. EXAM: ABDOMEN ULTRASOUND COMPLETE COMPARISON:  01/14/2011 CT FINDINGS: Gallbladder: The gallbladder is unremarkable. There is no evidence of cholelithiasis or acute cholecystitis. Common bile duct: Diameter: 2.8 mm. There is no evidence of intrahepatic or extrahepatic biliary dilatation. Liver: Unremarkable except for calcified granulomas. IVC: No abnormality visualized. Pancreas: Visualized portion unremarkable. Spleen: Unremarkable except for calcified granulomas. Right Kidney: Length: 10.8 cm. Echogenicity within normal limits. No mass or hydronephrosis visualized. Left Kidney: Length: 11.2 cm. Echogenicity within normal limits. No mass or hydronephrosis visualized. Abdominal aorta: No aneurysm visualized. Other findings: None. IMPRESSION: Normal abdominal ultrasound except for hepatic and splenic calcified granulomas. Electronically Signed   By: Harmon Pier M.D.   On: 11/09/2015 09:29    Assessment & Plan:   There are no diagnoses linked to this encounter. I am having Ms. Dayton Scrape maintain her Vitamin D3, SUPER B COMPLEX, multivitamin with minerals, Omega-3 Fatty Acids (OMEGA-3 FISH OIL PO), folic acid, OVER THE COUNTER MEDICATION, traZODone, pravastatin, aspirin EC, FLUoxetine, clonazePAM, and linaclotide.  No orders of the defined types were placed in this  encounter.    Follow-up: No Follow-up on file.  Sonda Primes, MD

## 2016-06-25 NOTE — Addendum Note (Signed)
Addended by: Cresenciano Lick on: 06/25/2016 04:50 PM   Modules accepted: Orders

## 2016-06-25 NOTE — Progress Notes (Signed)
Pre visit review using our clinic review tool, if applicable. No additional management support is needed unless otherwise documented below in the visit note. 

## 2016-07-02 ENCOUNTER — Ambulatory Visit
Admission: RE | Admit: 2016-07-02 | Discharge: 2016-07-02 | Disposition: A | Discharge status: Home / Self Care | Payer: Medicare HMO | Source: Ambulatory Visit | Attending: Internal Medicine | Admitting: Internal Medicine

## 2016-07-02 DIAGNOSIS — Z1231 Encounter for screening mammogram for malignant neoplasm of breast: Secondary | ICD-10-CM

## 2016-07-03 DIAGNOSIS — H16223 Keratoconjunctivitis sicca, not specified as Sjogren's, bilateral: Secondary | ICD-10-CM | POA: Diagnosis not present

## 2016-07-03 DIAGNOSIS — H04123 Dry eye syndrome of bilateral lacrimal glands: Secondary | ICD-10-CM | POA: Diagnosis not present

## 2016-07-10 ENCOUNTER — Encounter: Payer: Self-pay | Admitting: Internal Medicine

## 2016-07-11 ENCOUNTER — Encounter: Payer: Self-pay | Admitting: *Deleted

## 2016-07-19 ENCOUNTER — Other Ambulatory Visit: Payer: Self-pay | Admitting: Internal Medicine

## 2016-09-17 ENCOUNTER — Encounter: Payer: Self-pay | Admitting: Internal Medicine

## 2016-09-23 ENCOUNTER — Other Ambulatory Visit: Payer: Self-pay | Admitting: Internal Medicine

## 2016-09-23 DIAGNOSIS — E785 Hyperlipidemia, unspecified: Secondary | ICD-10-CM

## 2016-09-24 ENCOUNTER — Ambulatory Visit (INDEPENDENT_AMBULATORY_CARE_PROVIDER_SITE_OTHER): Payer: Medicare HMO | Admitting: Internal Medicine

## 2016-09-24 ENCOUNTER — Other Ambulatory Visit: Payer: Medicare HMO

## 2016-09-24 ENCOUNTER — Encounter: Payer: Self-pay | Admitting: Internal Medicine

## 2016-09-24 DIAGNOSIS — K5901 Slow transit constipation: Secondary | ICD-10-CM

## 2016-09-24 DIAGNOSIS — M797 Fibromyalgia: Secondary | ICD-10-CM

## 2016-09-24 DIAGNOSIS — F329 Major depressive disorder, single episode, unspecified: Secondary | ICD-10-CM

## 2016-09-24 DIAGNOSIS — R69 Illness, unspecified: Secondary | ICD-10-CM | POA: Diagnosis not present

## 2016-09-24 DIAGNOSIS — E785 Hyperlipidemia, unspecified: Secondary | ICD-10-CM

## 2016-09-24 MED ORDER — LINACLOTIDE 290 MCG PO CAPS: 290.0000 ug | ORAL_CAPSULE | Freq: Every day | ORAL | 3 refills | Status: DC

## 2016-09-24 NOTE — Progress Notes (Signed)
Pre visit review using our clinic review tool, if applicable. No additional management support is needed unless otherwise documented below in the visit note. 

## 2016-09-24 NOTE — Assessment & Plan Note (Signed)
On Fluoxetins

## 2016-09-24 NOTE — Assessment & Plan Note (Signed)
Miralax Amitiza or Linzess

## 2016-09-24 NOTE — Progress Notes (Signed)
Subjective:  Patient ID: Shannon Sutton, female    DOB: [birth year]  Age: 70 y.o. MRN: [id number]  CC: No chief complaint on file.   HPI [name] presents for dyslipidemia - not taking statin, constipation, abd pain. She stopped Cymbalta. Using Prozac  Outpatient Medications Prior to Visit  Medication Sig Dispense Refill  . aspirin EC 81 MG tablet Take 81 mg by mouth daily.    . B Complex-C (SUPER B COMPLEX) TABS Take 1 tablet by mouth daily.     . Cholecalciferol (VITAMIN D3) 2000 UNITS TABS Take 1 tablet by mouth daily.      . clonazePAM (KLONOPIN) 0.5 MG tablet TAKE ONE TO TWO TABLETS BY MOUTH AT BEDTIME AS NEEDED 60 tablet 0  . FLUoxetine (PROZAC) 40 MG capsule Take 1 capsule (40 mg total) by mouth every other day. 90 capsule 3  . folic acid (FOLVITE) 800 MCG tablet Take 400 mcg by mouth daily.    . Multiple Vitamins-Minerals (MULTIVITAMIN WITH MINERALS) tablet Take 1 tablet by mouth daily.      Marland Kitchen OVER THE COUNTER MEDICATION Take 1 tablet by mouth 2 (two) times daily. BAYER BACK AND BODY WITH CAFFEINE1000-650    . traZODone (DESYREL) 50 MG tablet TAKE ONE TABLET BY MOUTH ONCE DAILY AT BEDTIME 90 tablet 3  . DULoxetine (CYMBALTA) 20 MG capsule Take 1 capsule (20 mg total) by mouth daily. (Patient not taking: Reported on 09/24/2016) 30 capsule 5  . linaclotide (LINZESS) 145 MCG CAPS capsule Take 1 capsule (145 mcg total) by mouth daily before breakfast. (Patient not taking: Reported on 09/24/2016) 90 capsule 3  . Omega-3 Fatty Acids (OMEGA-3 FISH OIL PO) Take by mouth.       No facility-administered medications prior to visit.     ROS Review of Systems  Constitutional: Negative for activity change, appetite change, chills, fatigue and unexpected weight change.  HENT: Negative for congestion, mouth sores and sinus pressure.   Eyes: Negative for visual disturbance.  Respiratory: Negative for cough and chest tightness.   Gastrointestinal: Positive for constipation. Negative for  abdominal pain and nausea.  Genitourinary: Negative for difficulty urinating, frequency and vaginal pain.  Musculoskeletal: Positive for arthralgias and back pain. Negative for gait problem.  Skin: Negative for pallor and rash.  Neurological: Negative for dizziness, tremors, weakness, numbness and headaches.  Psychiatric/Behavioral: Negative for confusion and sleep disturbance.    Objective:  BP 122/80   Pulse 69   Wt 126 lb (57.2 kg)   SpO2 97%   BMI 23.81 kg/m   BP Readings from Last 3 Encounters:  09/24/16 122/80  06/25/16 130/82  05/29/16 128/80    Wt Readings from Last 3 Encounters:  09/24/16 126 lb (57.2 kg)  06/25/16 125 lb (56.7 kg)  05/29/16 125 lb (56.7 kg)    Physical Exam  Constitutional: She appears well-developed. No distress.  HENT:  Head: Normocephalic.  Right Ear: External ear normal.  Left Ear: External ear normal.  Nose: Nose normal.  Mouth/Throat: Oropharynx is clear and moist.  Eyes: Conjunctivae are normal. Pupils are equal, round, and reactive to light. Right eye exhibits no discharge. Left eye exhibits no discharge.  Neck: Normal range of motion. Neck supple. No JVD present. No tracheal deviation present. No thyromegaly present.  Cardiovascular: Normal rate, regular rhythm and normal heart sounds.   Pulmonary/Chest: No stridor. No respiratory distress. She has no wheezes.  Abdominal: Soft. Bowel sounds are normal. She exhibits no distension and no mass. There  is no tenderness. There is no rebound and no guarding.  Musculoskeletal: She exhibits no edema or tenderness.  Lymphadenopathy:    She has no cervical adenopathy.  Neurological: She displays normal reflexes. No cranial nerve deficit. She exhibits normal muscle tone. Coordination normal.  Skin: No rash noted. No erythema.  Psychiatric: She has a normal mood and affect. Her behavior is normal. Judgment and thought content normal.    Lab Results  Component Value Date   WBC 5.2 05/29/2016    HGB 13.6 05/29/2016   HCT 39.3 05/29/2016   PLT 239.0 05/29/2016   GLUCOSE 83 05/29/2016   CHOL 210 (H) 11/23/2015   TRIG 52.0 11/23/2015   HDL 75.20 11/23/2015   LDLCALC 124 (H) 11/23/2015   ALT 12 05/29/2016   AST 15 05/29/2016   NA 137 05/29/2016   K 4.2 05/29/2016   CL 101 05/29/2016   CREATININE 0.61 05/29/2016   BUN 11 05/29/2016   CO2 28 05/29/2016   TSH 1.72 05/29/2016    Mm Screening Breast Tomo Bilateral  Result Date: 07/04/2016 CLINICAL DATA:  Screening. History of bilateral breast reduction mammoplasty in 1995. EXAM: 2D DIGITAL SCREENING BILATERAL MAMMOGRAM WITH CAD AND ADJUNCT TOMO COMPARISON:  Previous exam(s). ACR Breast Density Category c: The breast tissue is heterogeneously dense, which may obscure small masses. FINDINGS: There are no findings suspicious for malignancy. Images were processed with CAD. IMPRESSION: No mammographic evidence of malignancy. A result letter of this screening mammogram will be mailed directly to the patient. RECOMMENDATION: Screening mammogram in one year. (Code:SM-B-01Y) BI-RADS CATEGORY  1: Negative. Electronically Signed   By: Ted Mcalpine M.D.   On: 07/04/2016 12:46    Assessment & Plan:   There are no diagnoses linked to this encounter. I am having Ms. Dayton Scrape maintain her Vitamin D3, SUPER B COMPLEX, multivitamin with minerals, Omega-3 Fatty Acids (OMEGA-3 FISH OIL PO), folic acid, OVER THE COUNTER MEDICATION, aspirin EC, clonazePAM, linaclotide, DULoxetine, FLUoxetine, traZODone, LOTEMAX, and AMITIZA.  Meds ordered this encounter  Medications  . LOTEMAX 0.5 % GEL    Sig: as needed.  Valinda Hoar 24 MCG capsule    Sig: Take 1 capsule by mouth 2 (two) times daily.     Follow-up: No Follow-up on file.  Sonda Primes, MD

## 2016-09-24 NOTE — Assessment & Plan Note (Signed)
Cont w/yoga Props in bed

## 2016-09-26 ENCOUNTER — Other Ambulatory Visit (INDEPENDENT_AMBULATORY_CARE_PROVIDER_SITE_OTHER): Payer: Medicare HMO

## 2016-09-26 DIAGNOSIS — R945 Abnormal results of liver function studies: Secondary | ICD-10-CM | POA: Diagnosis not present

## 2016-09-26 DIAGNOSIS — E785 Hyperlipidemia, unspecified: Secondary | ICD-10-CM

## 2016-09-26 DIAGNOSIS — Z1159 Encounter for screening for other viral diseases: Secondary | ICD-10-CM | POA: Diagnosis not present

## 2016-09-26 LAB — HEPATITIS PANEL, ACUTE
HCV Ab: NEGATIVE
HEP A IGM: NONREACTIVE
HEP B C IGM: NONREACTIVE
HEP B S AG: NEGATIVE

## 2016-09-26 LAB — LIPID PANEL
Cholesterol: 253 mg/dL — ABNORMAL HIGH (ref 0–200)
HDL: 82.2 mg/dL (ref >39.00)
LDL Cholesterol: 157 mg/dL — ABNORMAL HIGH (ref 0–99)
NONHDL: 170.92
Total CHOL/HDL Ratio: 3
Triglycerides: 70 mg/dL (ref 0.0–149.0)
VLDL: 14 mg/dL (ref 0.0–40.0)

## 2016-09-29 ENCOUNTER — Encounter: Payer: Self-pay | Admitting: Internal Medicine

## 2016-10-13 HISTORY — PX: COLONOSCOPY: SHX174

## 2016-10-22 ENCOUNTER — Other Ambulatory Visit: Payer: Self-pay | Admitting: Cardiology

## 2016-10-24 NOTE — Telephone Encounter (Signed)
          Patient Result Comments   Viewed by Wyman Songster on 09/29/2016 9:05 AM  Written by Cassandria Anger, MD on 09/28/2016 9:05 PM  Dear Mrs Valere Dross,  Your lipids are up a little; your hepatitis tests are good.  Sincerely,  Walker Kehr, MD    Here are results from pts PCP on her lipids that he ordered and resulted back in December.  Pt should follow up with her PCP in regards to wanting to restart Rosuvastatin, for he stopped this and drew her recent labs.  He also didn't indicate in her labs that he wanted her to start back on a statin.  Thanks

## 2016-10-24 NOTE — Telephone Encounter (Signed)
Spoke with patient and gave her recommendations as below. She stated that she would call pcp office to inquire about statin medication.

## 2016-10-24 NOTE — Telephone Encounter (Signed)
This medication is not listed on patients current med list, looks like it was discontinued by her pcp 10/05/15 due to side effects. Spoke with patient and she stated that Dr Alain Marion took her off of it due to issues with bloating. She informed me that she recently had a lipid panel and her levels were elevated. She had been off of cholesterol medication for six months prior to the labs. She would like to resume the rosuvastatin. Please advise. Thanks, MI

## 2016-10-25 ENCOUNTER — Encounter: Payer: Self-pay | Admitting: Internal Medicine

## 2016-10-27 ENCOUNTER — Other Ambulatory Visit: Payer: Self-pay | Admitting: Internal Medicine

## 2016-10-27 MED ORDER — ROSUVASTATIN CALCIUM 20 MG PO TABS
20.0000 mg | ORAL_TABLET | Freq: Every day | ORAL | 3 refills | Status: DC
Start: 2016-10-27 — End: 2017-02-16

## 2016-11-16 ENCOUNTER — Other Ambulatory Visit: Payer: Self-pay | Admitting: Internal Medicine

## 2016-11-20 ENCOUNTER — Encounter: Payer: Self-pay | Admitting: Gastroenterology

## 2016-11-21 ENCOUNTER — Telehealth: Payer: Self-pay | Admitting: Internal Medicine

## 2016-11-21 ENCOUNTER — Other Ambulatory Visit: Payer: Self-pay | Admitting: Internal Medicine

## 2016-11-21 NOTE — Telephone Encounter (Signed)
Spoke with [employer] [employer] on [address] gave verbal for [place] per [facility]. [facility] said they do not have rx we send into 11/18/2016.

## 2016-11-22 NOTE — Telephone Encounter (Signed)
Medication filled yesterday

## 2016-12-08 ENCOUNTER — Encounter: Payer: Self-pay | Admitting: Internal Medicine

## 2016-12-08 DIAGNOSIS — R5383 Other fatigue: Secondary | ICD-10-CM

## 2016-12-22 ENCOUNTER — Encounter: Payer: Self-pay | Admitting: Internal Medicine

## 2016-12-25 DIAGNOSIS — H524 Presbyopia: Secondary | ICD-10-CM | POA: Diagnosis not present

## 2017-01-05 DIAGNOSIS — K229 Disease of esophagus, unspecified: Secondary | ICD-10-CM | POA: Diagnosis not present

## 2017-01-05 DIAGNOSIS — M797 Fibromyalgia: Secondary | ICD-10-CM | POA: Diagnosis not present

## 2017-01-05 DIAGNOSIS — Z87891 Personal history of nicotine dependence: Secondary | ICD-10-CM | POA: Diagnosis not present

## 2017-01-05 DIAGNOSIS — Z0000 Encounter for general adult medical examination without abnormal findings: Secondary | ICD-10-CM | POA: Diagnosis not present

## 2017-01-05 DIAGNOSIS — Z6823 Body mass index (BMI) 23.0-23.9, adult: Secondary | ICD-10-CM | POA: Diagnosis not present

## 2017-01-05 DIAGNOSIS — Z9181 History of falling: Secondary | ICD-10-CM | POA: Diagnosis not present

## 2017-01-05 DIAGNOSIS — K5900 Constipation, unspecified: Secondary | ICD-10-CM | POA: Diagnosis not present

## 2017-01-05 DIAGNOSIS — R69 Illness, unspecified: Secondary | ICD-10-CM | POA: Diagnosis not present

## 2017-01-05 DIAGNOSIS — G478 Other sleep disorders: Secondary | ICD-10-CM | POA: Diagnosis not present

## 2017-01-05 DIAGNOSIS — E049 Nontoxic goiter, unspecified: Secondary | ICD-10-CM | POA: Diagnosis not present

## 2017-01-05 DIAGNOSIS — N329 Bladder disorder, unspecified: Secondary | ICD-10-CM | POA: Diagnosis not present

## 2017-01-05 DIAGNOSIS — R251 Tremor, unspecified: Secondary | ICD-10-CM | POA: Diagnosis not present

## 2017-01-05 DIAGNOSIS — E7800 Pure hypercholesterolemia, unspecified: Secondary | ICD-10-CM | POA: Diagnosis not present

## 2017-01-05 DIAGNOSIS — M354 Diffuse (eosinophilic) fasciitis: Secondary | ICD-10-CM | POA: Diagnosis not present

## 2017-01-05 DIAGNOSIS — H04129 Dry eye syndrome of unspecified lacrimal gland: Secondary | ICD-10-CM | POA: Diagnosis not present

## 2017-01-07 ENCOUNTER — Encounter: Payer: Self-pay | Admitting: Gastroenterology

## 2017-01-19 ENCOUNTER — Telehealth: Payer: Self-pay | Admitting: Internal Medicine

## 2017-01-19 NOTE — Telephone Encounter (Signed)
Patient Name: REANNE NELLUMS  DOB: 06/10/1946    Initial Comment Caller states having extreme exhaustion, tremors, cold all the time,sometimes she can feel her heart beating and has to stop and take a breath   Nurse Assessment  Nurse: Leilani Merl, RN, Nira Conn Date/Time (Eastern Time): 01/19/2017 10:01:30 AM  Confirm and document reason for call. If symptomatic, describe symptoms. ---Caller states having extreme exhaustion, tremors, cold all the time, sometimes she can feel her heart beating and has to stop and take a breath  Does the patient have any new or worsening symptoms? ---Yes  Will a triage be completed? ---Yes  Related visit to physician within the last 2 weeks? ---No  Does the PT have any chronic conditions? (i.e. diabetes, asthma, etc.) ---Yes  List chronic conditions. ---See MR  Is this a behavioral health or substance abuse call? ---No     Guidelines    Guideline Title Affirmed Question Affirmed Notes  Weakness (Generalized) and Fatigue [1] MODERATE weakness (i.e., interferes with work, school, normal activities) AND [2] persists > 3 days    Final Disposition User   See Physician within 24 Hours Stand[place], RN, Water quality scientist    Comments  Appt tomorrow with Dr. Alain Marion at 11:15 am.   Referrals  REFERRED TO PCP OFFICE   Disagree/Comply: Comply

## 2017-01-20 ENCOUNTER — Encounter: Payer: Self-pay | Admitting: Internal Medicine

## 2017-01-20 ENCOUNTER — Ambulatory Visit (INDEPENDENT_AMBULATORY_CARE_PROVIDER_SITE_OTHER): Payer: Medicare HMO | Admitting: Internal Medicine

## 2017-01-20 ENCOUNTER — Other Ambulatory Visit (INDEPENDENT_AMBULATORY_CARE_PROVIDER_SITE_OTHER): Payer: Medicare HMO

## 2017-01-20 DIAGNOSIS — M797 Fibromyalgia: Secondary | ICD-10-CM

## 2017-01-20 DIAGNOSIS — R5382 Chronic fatigue, unspecified: Secondary | ICD-10-CM | POA: Diagnosis not present

## 2017-01-20 DIAGNOSIS — R69 Illness, unspecified: Secondary | ICD-10-CM | POA: Diagnosis not present

## 2017-01-20 DIAGNOSIS — F329 Major depressive disorder, single episode, unspecified: Secondary | ICD-10-CM

## 2017-01-20 DIAGNOSIS — E7800 Pure hypercholesterolemia, unspecified: Secondary | ICD-10-CM

## 2017-01-20 LAB — HEPATIC FUNCTION PANEL
ALBUMIN: 4.4 g/dL (ref 3.5–5.2)
ALK PHOS: 47 U/L (ref 39–117)
ALT: 14 U/L (ref 0–35)
AST: 15 U/L (ref 0–37)
Bilirubin, Direct: 0 mg/dL (ref 0.0–0.3)
TOTAL PROTEIN: 7.1 g/dL (ref 6.0–8.3)
Total Bilirubin: 0.4 mg/dL (ref 0.2–1.2)

## 2017-01-20 LAB — CBC WITH DIFFERENTIAL/PLATELET
BASOS PCT: 1 % (ref 0.0–3.0)
Basophils Absolute: 0.1 10*3/uL (ref 0.0–0.1)
Eosinophils Absolute: 0.2 10*3/uL (ref 0.0–0.7)
Eosinophils Relative: 3.4 % (ref 0.0–5.0)
HCT: 41.3 % (ref 36.0–46.0)
Hemoglobin: 13.8 g/dL (ref 12.0–15.0)
LYMPHS ABS: 2.5 10*3/uL (ref 0.7–4.0)
LYMPHS PCT: 38.6 % (ref 12.0–46.0)
MCHC: 33.4 g/dL (ref 30.0–36.0)
MCV: 92.8 fl (ref 78.0–100.0)
MONO ABS: 0.5 10*3/uL (ref 0.1–1.0)
MONOS PCT: 7 % (ref 3.0–12.0)
NEUTROS ABS: 3.3 10*3/uL (ref 1.4–7.7)
Neutrophils Relative %: 50 % (ref 43.0–77.0)
Platelets: 210 10*3/uL (ref 150.0–400.0)
RBC: 4.45 Mil/uL (ref 3.87–5.11)
RDW: 13.5 % (ref 11.5–15.5)
WBC: 6.6 10*3/uL (ref 4.0–10.5)

## 2017-01-20 LAB — SEDIMENTATION RATE: SED RATE: 1 mm/h (ref 0–30)

## 2017-01-20 LAB — CK: CK TOTAL: 34 U/L (ref 7–177)

## 2017-01-20 LAB — LIPID PANEL
Cholesterol: 194 mg/dL (ref 0–200)
HDL: 86.4 mg/dL (ref >39.00)
LDL CALC: 96 mg/dL (ref 0–99)
NONHDL: 107.45
Total CHOL/HDL Ratio: 2
Triglycerides: 59 mg/dL (ref 0.0–149.0)
VLDL: 11.8 mg/dL (ref 0.0–40.0)

## 2017-01-20 LAB — BASIC METABOLIC PANEL
BUN: 12 mg/dL (ref 6–23)
CHLORIDE: 103 meq/L (ref 96–112)
CO2: 29 meq/L (ref 19–32)
Calcium: 9.4 mg/dL (ref 8.4–10.5)
Creatinine, Ser: 0.65 mg/dL (ref 0.40–1.20)
GFR: 94.97 mL/min (ref >60.00)
Glucose, Bld: 91 mg/dL (ref 70–99)
POTASSIUM: 3.7 meq/L (ref 3.5–5.1)
SODIUM: 139 meq/L (ref 135–145)

## 2017-01-20 LAB — TSH: TSH: 1.8 u[IU]/mL (ref 0.35–4.50)

## 2017-01-20 LAB — T4, FREE: Free T4: 0.66 ng/dL (ref 0.60–1.60)

## 2017-01-20 NOTE — Assessment & Plan Note (Addendum)
  Myastenia gravis tests and copper tests (-) 2017 Pt saw Dr Tat 2017 TSH, FT4  Stop Crestor x2 weeks  Appt w/Dr Buddy Duty in July

## 2017-01-20 NOTE — Assessment & Plan Note (Signed)
Myastenia gravis tests and copper tests (-) 2017 Pt saw Dr Tat 2017 TSH, FT4  Stop Crestor x2 weeks  Appt w/Dr Buddy Duty in July (Endo)

## 2017-01-20 NOTE — Assessment & Plan Note (Addendum)
Labs We can try Trintellix in the future

## 2017-01-20 NOTE — Assessment & Plan Note (Addendum)
   Stop Crestor x2 weeks due to worsening weakness and pains  Appt w/Dr Buddy Duty in July

## 2017-01-20 NOTE — Progress Notes (Signed)
Pre visit review using our clinic review tool, if applicable. No additional management support is needed unless otherwise documented below in the visit note. 

## 2017-01-20 NOTE — Progress Notes (Signed)
Subjective:  Patient ID: Shannon Sutton, female    DOB: 1945/12/04  Age: 71 y.o. MRN: [id number]  CC: No chief complaint on file.   HPI Shannon Sutton presents for CFS: worse now - feeling exauhsted x 6 mo - worse. Feeling too tired. Feeling cold all the time. Getting worse... Legs are weak - hard to walk... F/u FMS - "I hurt everywhere"  Outpatient Medications Prior to Visit  Medication Sig Dispense Refill  . aspirin EC 81 MG tablet Take 81 mg by mouth daily.    . B Complex-C (SUPER B COMPLEX) TABS Take 1 tablet by mouth daily.     . Cholecalciferol (VITAMIN D3) 2000 UNITS TABS Take 1 tablet by mouth daily.      . clonazePAM (KLONOPIN) 0.5 MG tablet TAKE ONE TO TWO TABLETS BY MOUTH AT BEDTIME AS NEEDED 60 tablet 3  . FLUoxetine (PROZAC) 40 MG capsule Take 1 capsule (40 mg total) by mouth every other day. 90 capsule 3  . folic acid (FOLVITE) 800 MCG tablet Take 400 mcg by mouth daily.    Marland Kitchen linaclotide (LINZESS) 290 MCG CAPS capsule Take 1 capsule (290 mcg total) by mouth daily. 90 capsule 3  . LOTEMAX 0.5 % GEL as needed.    . Multiple Vitamins-Minerals (MULTIVITAMIN WITH MINERALS) tablet Take 1 tablet by mouth daily.      Marland Kitchen OVER THE COUNTER MEDICATION Take 1 tablet by mouth 2 (two) times daily. BAYER BACK AND BODY WITH [id number]    . rosuvastatin (CRESTOR) 20 MG tablet Take 1 tablet (20 mg total) by mouth daily. 90 tablet 3  . traZODone (DESYREL) 50 MG tablet TAKE ONE TABLET BY MOUTH ONCE DAILY AT BEDTIME 90 tablet 3  . AMITIZA 24 MCG capsule Take 1 capsule by mouth 2 (two) times daily.    . Omega-3 Fatty Acids (OMEGA-3 FISH OIL PO) Take by mouth.       No facility-administered medications prior to visit.     ROS Review of Systems  Constitutional: Positive for fatigue. Negative for activity change, appetite change, chills and unexpected weight change.  HENT: Negative for congestion, mouth sores and sinus pressure.   Eyes: Negative for visual disturbance.  Respiratory:  Negative for cough and chest tightness.   Gastrointestinal: Negative for abdominal pain and nausea.  Genitourinary: Negative for difficulty urinating, frequency and vaginal pain.  Musculoskeletal: Negative for back pain and gait problem.  Skin: Negative for pallor and rash.  Neurological: Positive for tremors and weakness. Negative for dizziness, numbness and headaches.  Psychiatric/Behavioral: Negative for confusion, sleep disturbance and suicidal ideas. The patient is nervous/anxious.     Objective:  BP (!) 126/58 (BP Location: Left Arm, Patient Position: Sitting, Cuff Size: Normal)   Pulse 80   Temp 97.8 F (36.6 C) (Oral)   Ht 5\' 1"  (1.549 m)   Wt 124 lb 0.6 oz (56.3 kg)   SpO2 99%   BMI 23.44 kg/m   BP Readings from Last 3 Encounters:  01/20/17 (!) 126/58  09/24/16 122/80  06/25/16 130/82    Wt Readings from Last 3 Encounters:  01/20/17 124 lb 0.6 oz (56.3 kg)  09/24/16 126 lb (57.2 kg)  06/25/16 125 lb (56.7 kg)    Physical Exam  Constitutional: She appears well-developed. No distress.  HENT:  Head: Normocephalic.  Right Ear: External ear normal.  Left Ear: External ear normal.  Nose: Nose normal.  Mouth/Throat: Oropharynx is clear and moist.  Eyes: Conjunctivae are normal. Pupils are  equal, round, and reactive to light. Right eye exhibits no discharge. Left eye exhibits no discharge.  Neck: Normal range of motion. Neck supple. No JVD present. No tracheal deviation present. No thyromegaly present.  Cardiovascular: Normal rate, regular rhythm and normal heart sounds.   Pulmonary/Chest: No stridor. No respiratory distress. She has no wheezes.  Abdominal: Soft. Bowel sounds are normal. She exhibits no distension and no mass. There is no tenderness. There is no rebound and no guarding.  Musculoskeletal: She exhibits no edema or tenderness.  Lymphadenopathy:    She has no cervical adenopathy.  Neurological: She displays normal reflexes. No cranial nerve deficit. She  exhibits normal muscle tone. Coordination normal.  Skin: No rash noted. No erythema.  Psychiatric: She has a normal mood and affect. Her behavior is normal. Judgment and thought content normal.  large musles tender 5-/5 weakness in thighs, shoulder flexors A complex case   FTF>40 min discussing CFS worsening and leg weakness  A/o/c  Lab Results  Component Value Date   WBC 5.2 05/29/2016   HGB 13.6 05/29/2016   HCT 39.3 05/29/2016   PLT 239.0 05/29/2016   GLUCOSE 83 05/29/2016   CHOL 253 (H) 09/26/2016   TRIG 70.0 09/26/2016   HDL 82.20 09/26/2016   LDLCALC 157 (H) 09/26/2016   ALT 12 05/29/2016   AST 15 05/29/2016   NA 137 05/29/2016   K 4.2 05/29/2016   CL 101 05/29/2016   CREATININE 0.61 05/29/2016   BUN 11 05/29/2016   CO2 28 05/29/2016   TSH 1.72 05/29/2016    Mm Screening Breast Tomo Bilateral  Result Date: 07/02/2016 CLINICAL DATA:  Screening. History of bilateral breast reduction mammoplasty in 1995. EXAM: 2D DIGITAL SCREENING BILATERAL MAMMOGRAM WITH CAD AND ADJUNCT TOMO COMPARISON:  Previous exam(s). ACR Breast Density Category c: The breast tissue is heterogeneously dense, which may obscure small masses. FINDINGS: There are no findings suspicious for malignancy. Images were processed with CAD. IMPRESSION: No mammographic evidence of malignancy. A result letter of this screening mammogram will be mailed directly to the patient. RECOMMENDATION: Screening mammogram in one year. (Code:SM-B-01Y) BI-RADS CATEGORY  1: Negative. Electronically Signed   By: Ted Mcalpine M.D.   On: 07/04/2016 12:46    Assessment & Plan:   There are no diagnoses linked to this encounter. I have discontinued [name]'s Omega-3 Fatty Acids (OMEGA-3 FISH OIL PO) and AMITIZA. I am also having her maintain her Vitamin D3, SUPER B COMPLEX, multivitamin with minerals, folic acid, OVER THE COUNTER MEDICATION, aspirin EC, FLUoxetine, traZODone, LOTEMAX, linaclotide, rosuvastatin, and  clonazePAM.  No orders of the defined types were placed in this encounter.    Follow-up: No Follow-up on file.  Sonda Primes, MD

## 2017-01-20 NOTE — Patient Instructions (Addendum)
Stop Crestor x2-4 weeks due to your worsening weakness and pains please

## 2017-02-04 DIAGNOSIS — Z92241 Personal history of systemic steroid therapy: Secondary | ICD-10-CM | POA: Diagnosis not present

## 2017-02-04 DIAGNOSIS — Z808 Family history of malignant neoplasm of other organs or systems: Secondary | ICD-10-CM | POA: Diagnosis not present

## 2017-02-04 DIAGNOSIS — R208 Other disturbances of skin sensation: Secondary | ICD-10-CM | POA: Diagnosis not present

## 2017-02-04 DIAGNOSIS — Z8349 Family history of other endocrine, nutritional and metabolic diseases: Secondary | ICD-10-CM | POA: Diagnosis not present

## 2017-02-04 DIAGNOSIS — R6889 Other general symptoms and signs: Secondary | ICD-10-CM | POA: Diagnosis not present

## 2017-02-04 DIAGNOSIS — R5383 Other fatigue: Secondary | ICD-10-CM | POA: Diagnosis not present

## 2017-02-04 DIAGNOSIS — R251 Tremor, unspecified: Secondary | ICD-10-CM | POA: Diagnosis not present

## 2017-02-04 DIAGNOSIS — R51 Headache: Secondary | ICD-10-CM | POA: Diagnosis not present

## 2017-02-09 DIAGNOSIS — R258 Other abnormal involuntary movements: Secondary | ICD-10-CM | POA: Diagnosis not present

## 2017-02-09 DIAGNOSIS — Z92241 Personal history of systemic steroid therapy: Secondary | ICD-10-CM | POA: Diagnosis not present

## 2017-02-09 DIAGNOSIS — R5383 Other fatigue: Secondary | ICD-10-CM | POA: Diagnosis not present

## 2017-02-09 DIAGNOSIS — R6889 Other general symptoms and signs: Secondary | ICD-10-CM | POA: Diagnosis not present

## 2017-02-09 DIAGNOSIS — Z8349 Family history of other endocrine, nutritional and metabolic diseases: Secondary | ICD-10-CM | POA: Diagnosis not present

## 2017-02-16 ENCOUNTER — Encounter: Payer: Self-pay | Admitting: Gastroenterology

## 2017-02-16 ENCOUNTER — Ambulatory Visit (AMBULATORY_SURGERY_CENTER): Payer: Self-pay

## 2017-02-16 VITALS — Ht 61.0 in | Wt 127.4 lb

## 2017-02-16 DIAGNOSIS — Z86010 Personal history of colonic polyps: Secondary | ICD-10-CM

## 2017-02-16 MED ORDER — NA SULFATE-K SULFATE-MG SULF 17.5-3.13-1.6 GM/177ML PO SOLN: 1.0000 | Freq: Once | ORAL | 0 refills | Status: AC

## 2017-02-16 NOTE — Progress Notes (Signed)
Denies allergies to eggs or soy products. Denies complication of anesthesia or sedation. Denies use of weight loss medication. Denies use of O2.   Emmi instructions given for colonoscopy.  

## 2017-03-02 ENCOUNTER — Encounter: Payer: Self-pay | Admitting: Gastroenterology

## 2017-03-02 ENCOUNTER — Ambulatory Visit (AMBULATORY_SURGERY_CENTER): Payer: Medicare HMO | Admitting: Gastroenterology

## 2017-03-02 VITALS — BP 123/65 | HR 68 | Temp 98.9°F | Resp 14 | Ht 61.0 in | Wt 124.0 lb

## 2017-03-02 DIAGNOSIS — Z86010 Personal history of colonic polyps: Secondary | ICD-10-CM

## 2017-03-02 DIAGNOSIS — M797 Fibromyalgia: Secondary | ICD-10-CM | POA: Diagnosis not present

## 2017-03-02 DIAGNOSIS — Z885 Allergy status to narcotic agent status: Secondary | ICD-10-CM | POA: Diagnosis not present

## 2017-03-02 MED ORDER — SODIUM CHLORIDE 0.9 % IV SOLN: 500.0000 mL | INTRAVENOUS | Status: DC

## 2017-03-02 NOTE — Progress Notes (Signed)
  [place] Anesthesia Post-op Note  Patient: Shannon Sutton  Procedure(s) Performed: colonoscopy  Patient Location: [facility] - Recovery Area  Anesthesia Type: Deep Sedation/Propofol  Level of Consciousness: awake, oriented and patient cooperative  Airway and Oxygen Therapy: Patient Spontanous Breathing  Post-op Pain: none  Post-op Assessment:  Post-op Vital signs reviewed, Patient's Cardiovascular Status Stable, Respiratory Function Stable, Patent Airway, No signs of Nausea or vomiting and Pain level controlled  Post-op Vital Signs: Reviewed and stable  Complications: No apparent anesthesia complications  Salome Hautala E Rowdy Guerrini 12:44 PM

## 2017-03-02 NOTE — Progress Notes (Signed)
Pt's states no medical or surgical changes since previsit or office visit. 

## 2017-03-02 NOTE — Patient Instructions (Signed)
YOU HAD AN ENDOSCOPIC PROCEDURE TODAY AT [facility]:   Refer to the procedure report that was given to you for any specific questions about what was found during the examination.  If the procedure report does not answer your questions, please call your gastroenterologist to clarify.  If you requested that your care partner not be given the details of your procedure findings, then the procedure report has been included in a sealed envelope for you to review at your convenience later.  YOU SHOULD EXPECT: Some feelings of bloating in the abdomen. Passage of more gas than usual.  Walking can help get rid of the air that was put into your GI tract during the procedure and reduce the bloating. If you had a lower endoscopy (such as a colonoscopy or flexible sigmoidoscopy) you may notice spotting of blood in your stool or on the toilet paper. If you underwent a bowel prep for your procedure, you may not have a normal bowel movement for a few days.  Please Note:  You might notice some irritation and congestion in your nose or some drainage.  This is from the oxygen used during your procedure.  There is no need for concern and it should clear up in a day or so.  SYMPTOMS TO REPORT IMMEDIATELY:   Following lower endoscopy (colonoscopy or flexible sigmoidoscopy):  Excessive amounts of blood in the stool  Significant tenderness or worsening of abdominal pains  Swelling of the abdomen that is new, acute  Fever of 100F or higher   Please read handouts given to you by your recovery nurse.  For urgent or emergent issues, a gastroenterologist can be reached at any hour by calling [phone].   DIET:  We do recommend a small meal at first, but then you may proceed to your regular diet.  Drink plenty of fluids but you should avoid alcoholic beverages for 24 hours.  ACTIVITY:  You should plan to take it easy for the rest of today and you should NOT DRIVE or use heavy machinery until tomorrow  (because of the sedation medicines used during the test).    FOLLOW UP: Our staff will call the number listed on your records the next business day following your procedure to check on you and address any questions or concerns that you may have regarding the information given to you following your procedure. If we do not reach you, we will leave a message.  However, if you are feeling well and you are not experiencing any problems, there is no need to return our call.  We will assume that you have returned to your regular daily activities without incident.  If any biopsies were taken you will be contacted by phone or by letter within the next 1-3 weeks.  Please call us at [phone] if you have not heard about the biopsies in 3 weeks.    SIGNATURES/CONFIDENTIALITY: You and/or your care partner have signed paperwork which will be entered into your electronic medical record.  These signatures attest to the fact that that the information above on your After Visit Summary has been reviewed and is understood.  Full responsibility of the confidentiality of this discharge information lies with you and/or your care-partner.  Thank you for letting us take care of your healthcare needs today.

## 2017-03-02 NOTE — Op Note (Signed)
[facility] Patient Name: Shannon Sutton Procedure Date: 03/02/2017 12:15 PM MRN: [id number] Endoscopist: Sherilyn Cooter L. Myrtie Neither , MD Age: 71 Referring MD:  Date of Birth: Feb 17, 1946 Gender: Female Account #: [id number] Procedure:                Colonoscopy Indications:              Surveillance: Personal history of adenomatous                            polyps on last colonoscopy 5 years ago Medicines:                Monitored Anesthesia Care Procedure:                Pre-Anesthesia Assessment:                           - Prior to the procedure, a History and Physical                            was performed, and patient medications and                            allergies were reviewed. The patient's tolerance of                            previous anesthesia was also reviewed. The risks                            and benefits of the procedure and the sedation                            options and risks were discussed with the patient.                            All questions were answered, and informed consent                            was obtained. Anticoagulants: The patient has taken                            aspirin. It was decided not to withhold this                            medication prior to the procedure. ASA Grade                            Assessment: II - A patient with mild systemic                            disease. After reviewing the risks and benefits,                            the patient was deemed in satisfactory condition to  undergo the procedure.                           After obtaining informed consent, the colonoscope                            was passed under direct vision. Throughout the                            procedure, the patient's blood pressure, pulse, and                            oxygen saturations were monitored continuously. The                            Colonoscope was introduced through the anus and                           advanced to the the cecum, identified by                            appendiceal orifice and ileocecal valve. The                            colonoscopy was performed without difficulty. The                            patient tolerated the procedure well. The quality                            of the bowel preparation was good. The ileocecal                            valve, appendiceal orifice, and rectum were                            photographed. The quality of the bowel preparation                            was evaluated using the BBPS [facility] Bowel                            Preparation Scale) with scores of: Right Colon = 2,                            Transverse Colon = 2 and Left Colon = 2. The total                            BBPS score equals 6. Some retained fibrous                            material. The bowel preparation used was SUPREP. Scope In: 12:27:24 PM Scope Out: 12:38:39 PM Scope Withdrawal Time: 0 hours 8 minutes 7 seconds  Total  Procedure Duration: 0 hours 11 minutes 15 seconds  Findings:                 The digital rectal exam findings include decreased                            sphincter tone.                           Multiple medium-mouthed diverticula were found in                            the entire colon.                           The exam was otherwise without abnormality on                            direct and retroflexion views. Complications:            No immediate complications. Estimated Blood Loss:     Estimated blood loss: none. Impression:               - Decreased sphincter tone found on digital rectal                            exam.                           - Diverticulosis in the entire examined colon.                           - The examination was otherwise normal on direct                            and retroflexion views.                           - No specimens collected. Recommendation:           - Patient  has a contact number available for                            emergencies. The signs and symptoms of potential                            delayed complications were discussed with the                            patient. Return to normal activities tomorrow.                            Written discharge instructions were provided to the                            patient.                           -  Resume previous diet.                           - Continue present medications.                           - Repeat colonoscopy in 5 years (if overall health                            allows) due to a family history of colon cancer in                            father (Dx in his early 32s). Javanni Maring L. Myrtie Neither, MD 03/02/2017 12:42:55 PM This report has been signed electronically.

## 2017-03-03 ENCOUNTER — Telehealth: Payer: Self-pay | Admitting: *Deleted

## 2017-03-03 NOTE — Telephone Encounter (Signed)
  Follow up Call-  Call back number 03/02/2017  Post procedure Call Back phone  # [phone]  Permission to leave phone message Yes  Some recent data might be hidden     Patient questions:  Do you have a fever, pain , or abdominal swelling? No. Pain Score  0 *  Have you tolerated food without any problems? Yes.    Have you been able to return to your normal activities? Yes.    Do you have any questions about your discharge instructions: Diet   No. Medications  No. Follow up visit  No.  Do you have questions or concerns about your Care? No.  Actions: * If pain score is 4 or above: No action needed, pain <4.

## 2017-03-04 ENCOUNTER — Other Ambulatory Visit: Payer: Self-pay | Admitting: Internal Medicine

## 2017-03-04 DIAGNOSIS — R6889 Other general symptoms and signs: Secondary | ICD-10-CM | POA: Diagnosis not present

## 2017-03-04 DIAGNOSIS — R5383 Other fatigue: Secondary | ICD-10-CM | POA: Diagnosis not present

## 2017-03-04 DIAGNOSIS — R519 Headache, unspecified: Secondary | ICD-10-CM

## 2017-03-04 DIAGNOSIS — Z8349 Family history of other endocrine, nutritional and metabolic diseases: Secondary | ICD-10-CM | POA: Diagnosis not present

## 2017-03-04 DIAGNOSIS — R51 Headache: Secondary | ICD-10-CM

## 2017-03-06 ENCOUNTER — Encounter: Payer: Self-pay | Admitting: Internal Medicine

## 2017-03-06 ENCOUNTER — Ambulatory Visit (INDEPENDENT_AMBULATORY_CARE_PROVIDER_SITE_OTHER): Payer: Medicare HMO | Admitting: Internal Medicine

## 2017-03-06 DIAGNOSIS — E7800 Pure hypercholesterolemia, unspecified: Secondary | ICD-10-CM

## 2017-03-06 DIAGNOSIS — R5382 Chronic fatigue, unspecified: Secondary | ICD-10-CM | POA: Diagnosis not present

## 2017-03-06 DIAGNOSIS — M797 Fibromyalgia: Secondary | ICD-10-CM

## 2017-03-06 DIAGNOSIS — F329 Major depressive disorder, single episode, unspecified: Secondary | ICD-10-CM | POA: Diagnosis not present

## 2017-03-06 DIAGNOSIS — R69 Illness, unspecified: Secondary | ICD-10-CM | POA: Diagnosis not present

## 2017-03-06 MED ORDER — VENLAFAXINE HCL ER 75 MG PO CP24: 75.0000 mg | ORAL_CAPSULE | Freq: Every day | ORAL | 5 refills | Status: DC

## 2017-03-06 NOTE — Progress Notes (Signed)
Subjective:  Patient ID: Shannon Sutton, female    DOB: 10-01-1946  Age: 71 y.o. MRN: [id number]  CC: No chief complaint on file.   HPI SOSHA EMSLEY presents for severe fatigue She is a little better off statin C/o paresthesias, exhaustion   She did see an endocrinologist  Coke 4+ a day   Outpatient Medications Prior to Visit  Medication Sig Dispense Refill  . aspirin EC 81 MG tablet Take 81 mg by mouth daily.    . B Complex-C (SUPER B COMPLEX) TABS Take 1 tablet by mouth daily.     . Cholecalciferol (VITAMIN D3) 2000 UNITS TABS Take 1 tablet by mouth daily.      . clonazePAM (KLONOPIN) 0.5 MG tablet TAKE ONE TO TWO TABLETS BY MOUTH AT BEDTIME AS NEEDED 60 tablet 3  . co-enzyme Q-10 30 MG capsule Take 30 mg by mouth 3 (three) times daily.    Marland Kitchen FLUoxetine (PROZAC) 40 MG capsule Take 1 capsule (40 mg total) by mouth every other day. 90 capsule 3  . folic acid (FOLVITE) 800 MCG tablet Take 400 mcg by mouth daily.    Marland Kitchen linaclotide (LINZESS) 290 MCG CAPS capsule Take 1 capsule (290 mcg total) by mouth daily. 90 capsule 3  . LOTEMAX 0.5 % GEL as needed.    . magnesium 30 MG tablet Take 30 mg by mouth 2 (two) times daily.    . Multiple Vitamins-Minerals (MULTIVITAMIN WITH MINERALS) tablet Take 1 tablet by mouth daily.      Marland Kitchen OVER THE COUNTER MEDICATION Take 1 tablet by mouth 2 (two) times daily. BAYER BACK AND BODY WITH CAFFEINE1000-650    . traZODone (DESYREL) 50 MG tablet TAKE ONE TABLET BY MOUTH ONCE DAILY AT BEDTIME 90 tablet 3   Facility-Administered Medications Prior to Visit  Medication Dose Route Frequency Provider Last Rate Last Dose  . 0.9 %  sodium chloride infusion  500 mL Intravenous Continuous Danis, Starr Lake III, MD        ROS Review of Systems  Constitutional: Negative for activity change, appetite change, chills, fatigue and unexpected weight change.  HENT: Negative for congestion, mouth sores and sinus pressure.   Eyes: Negative for visual disturbance.    Respiratory: Negative for cough and chest tightness.   Gastrointestinal: Negative for abdominal pain and nausea.  Genitourinary: Negative for difficulty urinating, frequency and vaginal pain.  Musculoskeletal: Negative for back pain and gait problem.  Skin: Negative for pallor and rash.  Neurological: Negative for dizziness, tremors, weakness, numbness and headaches.  Psychiatric/Behavioral: Negative for confusion and sleep disturbance.    Objective:  BP 120/78 (BP Location: Left Arm, Patient Position: Sitting, Cuff Size: Normal)   Pulse 86   Temp 97.7 F (36.5 C) (Oral)   Ht 5\' 1"  (1.549 m)   Wt 123 lb (55.8 kg)   SpO2 98%   BMI 23.24 kg/m   BP Readings from Last 3 Encounters:  03/06/17 120/78  03/02/17 123/65  01/20/17 (!) 126/58    Wt Readings from Last 3 Encounters:  03/06/17 123 lb (55.8 kg)  03/02/17 124 lb (56.2 kg)  02/16/17 127 lb 6.4 oz (57.8 kg)    Physical Exam  Lab Results  Component Value Date   WBC 6.6 01/20/2017   HGB 13.8 01/20/2017   HCT 41.3 01/20/2017   PLT 210.0 01/20/2017   GLUCOSE 91 01/20/2017   CHOL 194 01/20/2017   TRIG 59.0 01/20/2017   HDL 86.40 01/20/2017   LDLCALC 96 01/20/2017  ALT 14 01/20/2017   AST 15 01/20/2017   NA 139 01/20/2017   K 3.7 01/20/2017   CL 103 01/20/2017   CREATININE 0.65 01/20/2017   BUN 12 01/20/2017   CO2 29 01/20/2017   TSH 1.80 01/20/2017    Mm Screening Breast Tomo Bilateral  Result Date: 07/02/2016 CLINICAL DATA:  Screening. History of bilateral breast reduction mammoplasty in 1995. EXAM: 2D DIGITAL SCREENING BILATERAL MAMMOGRAM WITH CAD AND ADJUNCT TOMO COMPARISON:  Previous exam(s). ACR Breast Density Category c: The breast tissue is heterogeneously dense, which may obscure small masses. FINDINGS: There are no findings suspicious for malignancy. Images were processed with CAD. IMPRESSION: No mammographic evidence of malignancy. A result letter of this screening mammogram will be mailed directly to  the patient. RECOMMENDATION: Screening mammogram in one year. (Code:SM-B-01Y) BI-RADS CATEGORY  1: Negative. Electronically Signed   By: Ted Mcalpine M.D.   On: 07/04/2016 12:46    Assessment & Plan:   There are no diagnoses linked to this encounter. I am having Ms. Dayton Scrape maintain her Vitamin D3, SUPER B COMPLEX, multivitamin with minerals, folic acid, OVER THE COUNTER MEDICATION, aspirin EC, FLUoxetine, traZODone, LOTEMAX, linaclotide, clonazePAM, co-enzyme Q-10, and magnesium. We will continue to administer sodium chloride.  No orders of the defined types were placed in this encounter.    Follow-up: No Follow-up on file.  Sonda Primes, MD

## 2017-03-06 NOTE — Assessment & Plan Note (Addendum)
Effexor Caffeine-Coffe Better off a statin Brain MRI pending

## 2017-03-06 NOTE — Assessment & Plan Note (Signed)
Worse: start Effexor and stop Fluoxetine

## 2017-03-06 NOTE — Assessment & Plan Note (Signed)
Effexor Turmeric Better off a statin

## 2017-03-06 NOTE — Patient Instructions (Signed)
Try Turmeric for pain 

## 2017-03-06 NOTE — Assessment & Plan Note (Signed)
Statin intolerant 

## 2017-03-11 DIAGNOSIS — R799 Abnormal finding of blood chemistry, unspecified: Secondary | ICD-10-CM | POA: Diagnosis not present

## 2017-03-11 DIAGNOSIS — R6889 Other general symptoms and signs: Secondary | ICD-10-CM | POA: Diagnosis not present

## 2017-03-11 DIAGNOSIS — E871 Hypo-osmolality and hyponatremia: Secondary | ICD-10-CM | POA: Diagnosis not present

## 2017-03-11 DIAGNOSIS — I10 Essential (primary) hypertension: Secondary | ICD-10-CM | POA: Diagnosis not present

## 2017-03-11 DIAGNOSIS — R5383 Other fatigue: Secondary | ICD-10-CM | POA: Diagnosis not present

## 2017-03-11 DIAGNOSIS — Z92241 Personal history of systemic steroid therapy: Secondary | ICD-10-CM | POA: Diagnosis not present

## 2017-03-17 ENCOUNTER — Other Ambulatory Visit: Payer: Medicare HMO

## 2017-03-27 ENCOUNTER — Telehealth: Payer: Self-pay | Admitting: Internal Medicine

## 2017-03-27 NOTE — Telephone Encounter (Signed)
FYI: Pt wanted me to let you know that the change of anti depressant is helping and and wants to hold off on referral.

## 2017-04-06 NOTE — Telephone Encounter (Signed)
Noted. Thx.

## 2017-04-08 ENCOUNTER — Other Ambulatory Visit: Payer: Medicare HMO

## 2017-04-08 ENCOUNTER — Ambulatory Visit
Admission: RE | Admit: 2017-04-08 | Discharge: 2017-04-08 | Disposition: A | Discharge status: Home / Self Care | Payer: Medicare HMO | Source: Ambulatory Visit | Attending: Internal Medicine | Admitting: Internal Medicine

## 2017-04-08 DIAGNOSIS — R5383 Other fatigue: Secondary | ICD-10-CM

## 2017-04-08 DIAGNOSIS — R6889 Other general symptoms and signs: Secondary | ICD-10-CM

## 2017-04-08 DIAGNOSIS — R51 Headache: Secondary | ICD-10-CM

## 2017-04-08 DIAGNOSIS — R519 Headache, unspecified: Secondary | ICD-10-CM

## 2017-04-08 MED ORDER — GADOBENATE DIMEGLUMINE 529 MG/ML IV SOLN: 10.0000 mL | Freq: Once | INTRAVENOUS | Status: DC | PRN

## 2017-04-09 ENCOUNTER — Other Ambulatory Visit: Payer: Medicare HMO

## 2017-04-17 ENCOUNTER — Ambulatory Visit: Payer: Medicare HMO | Admitting: Internal Medicine

## 2017-05-08 ENCOUNTER — Ambulatory Visit: Payer: Medicare HMO | Admitting: Internal Medicine

## 2017-05-11 ENCOUNTER — Other Ambulatory Visit: Payer: Self-pay | Admitting: Internal Medicine

## 2017-05-11 NOTE — Telephone Encounter (Signed)
Please advise in Dr. Plotnikovs absence 

## 2017-05-18 ENCOUNTER — Encounter: Payer: Self-pay | Admitting: Internal Medicine

## 2017-05-18 ENCOUNTER — Ambulatory Visit (INDEPENDENT_AMBULATORY_CARE_PROVIDER_SITE_OTHER): Payer: Medicare HMO | Admitting: Internal Medicine

## 2017-05-18 DIAGNOSIS — F329 Major depressive disorder, single episode, unspecified: Secondary | ICD-10-CM | POA: Diagnosis not present

## 2017-05-18 DIAGNOSIS — M354 Diffuse (eosinophilic) fasciitis: Secondary | ICD-10-CM | POA: Diagnosis not present

## 2017-05-18 DIAGNOSIS — M797 Fibromyalgia: Secondary | ICD-10-CM | POA: Diagnosis not present

## 2017-05-18 DIAGNOSIS — D7218 Eosinophilia in diseases classified elsewhere: Secondary | ICD-10-CM

## 2017-05-18 DIAGNOSIS — R69 Illness, unspecified: Secondary | ICD-10-CM | POA: Diagnosis not present

## 2017-05-18 DIAGNOSIS — R42 Dizziness and giddiness: Secondary | ICD-10-CM | POA: Diagnosis not present

## 2017-05-18 MED ORDER — CYCLOBENZAPRINE HCL 5 MG PO TABS: 5.0000 mg | ORAL_TABLET | Freq: Two times a day (BID) | ORAL | 3 refills | Status: DC | PRN

## 2017-05-18 MED ORDER — FLUOXETINE HCL 20 MG PO TABS: 20.0000 mg | ORAL_TABLET | Freq: Every day | ORAL | 5 refills | Status: DC

## 2017-05-18 NOTE — Assessment & Plan Note (Signed)
Gluten free trial (no wheat products) for 4-6 weeks. OK to use gluten-free bread and gluten-free pasta. Flexeril prn

## 2017-05-18 NOTE — Assessment & Plan Note (Signed)
Flexeril low dose

## 2017-05-18 NOTE — Assessment & Plan Note (Addendum)
Discussed  Supine BP 130/80  Standing 120/75 - lightheaded Hold Trazodone Reduce Fluoxetine to 20 mg a day Salty foods Gluten free trial (no wheat products) for 4-6 weeks. OK to use gluten-free bread and gluten-free pasta.  ENT ref No driving Consider Neurology appt

## 2017-05-18 NOTE — Assessment & Plan Note (Signed)
Hold Trazodone Reduce Fluoxetine to 20 mg a day

## 2017-05-18 NOTE — Patient Instructions (Addendum)
Hold Trazodone Reduce Fluoxetine to 20 mg a day Salty foods  Gluten free trial (no wheat products) for 4-6 weeks. OK to use gluten-free bread and gluten-free pasta.

## 2017-05-18 NOTE — Progress Notes (Signed)
Subjective:  Patient ID: Shannon Sutton, female    DOB: Feb 16, 1946  Age: 71 y.o. MRN: [id number]  CC: No chief complaint on file.   HPI ENAJAH FARIN presents for depression C/o dizziness x 1 month The pt started back on Prozac 40 mg/d The pt stopped Effexor due to dizziness however she is just as dizzy Brain MRI was nl w/Dr Sharl Ma that she saw for fatigue; had blood work - ok  Outpatient Medications Prior to Visit  Medication Sig Dispense Refill  . aspirin EC 81 MG tablet Take 81 mg by mouth daily.    . B Complex-C (SUPER B COMPLEX) TABS Take 1 tablet by mouth daily.     . Cholecalciferol (VITAMIN D3) 2000 UNITS TABS Take 1 tablet by mouth daily.      . clonazePAM (KLONOPIN) 0.5 MG tablet TAKE 1 TO 2 TABLETS BY MOUTH AT BEDTIME AS NEEDED 60 tablet 0  . folic acid (FOLVITE) 800 MCG tablet Take 400 mcg by mouth daily.    Marland Kitchen linaclotide (LINZESS) 290 MCG CAPS capsule Take 1 capsule (290 mcg total) by mouth daily. 90 capsule 3  . LOTEMAX 0.5 % GEL as needed.    . magnesium 30 MG tablet Take 30 mg by mouth 2 (two) times daily.    . Multiple Vitamins-Minerals (MULTIVITAMIN WITH MINERALS) tablet Take 1 tablet by mouth daily.      Marland Kitchen OVER THE COUNTER MEDICATION Take 1 tablet by mouth 2 (two) times daily. BAYER BACK AND BODY WITH CAFFEINE1000-650    . traZODone (DESYREL) 50 MG tablet TAKE ONE TABLET BY MOUTH ONCE DAILY AT BEDTIME 90 tablet 3  . co-enzyme Q-10 30 MG capsule Take 30 mg by mouth 3 (three) times daily.    Marland Kitchen venlafaxine XR (EFFEXOR XR) 75 MG 24 hr capsule Take 1 capsule (75 mg total) by mouth daily with breakfast. 30 capsule 5   Facility-Administered Medications Prior to Visit  Medication Dose Route Frequency Provider Last Rate Last Dose  . 0.9 %  sodium chloride infusion  500 mL Intravenous Continuous Danis, Starr Lake III, MD        ROS Review of Systems  Constitutional: Positive for fatigue. Negative for activity change, appetite change, chills and unexpected weight change.    HENT: Negative for congestion, mouth sores and sinus pressure.   Eyes: Negative for visual disturbance.  Respiratory: Negative for cough and chest tightness.   Gastrointestinal: Negative for abdominal pain and nausea.  Genitourinary: Negative for difficulty urinating, frequency and vaginal pain.  Musculoskeletal: Positive for arthralgias. Negative for back pain and gait problem.  Skin: Negative for pallor and rash.  Neurological: Positive for dizziness. Negative for tremors, weakness, numbness and headaches.  Psychiatric/Behavioral: Positive for dysphoric mood. Negative for confusion and sleep disturbance. The patient is nervous/anxious.     Objective:  BP 110/65 (BP Location: Left Arm, Patient Position: Sitting, Cuff Size: Normal)   Pulse 65   Temp 98.5 F (36.9 C) (Oral)   Ht 5\' 1"  (1.549 m)   Wt 123 lb (55.8 kg)   SpO2 100%   BMI 23.24 kg/m   BP Readings from Last 3 Encounters:  05/18/17 110/65  03/06/17 120/78  03/02/17 123/65    Wt Readings from Last 3 Encounters:  05/18/17 123 lb (55.8 kg)  03/06/17 123 lb (55.8 kg)  03/02/17 124 lb (56.2 kg)    Physical Exam  Constitutional: She appears well-developed. No distress.  HENT:  Head: Normocephalic.  Right Ear: External ear  normal.  Left Ear: External ear normal.  Nose: Nose normal.  Mouth/Throat: Oropharynx is clear and moist.  Eyes: Pupils are equal, round, and reactive to light. Conjunctivae are normal. Right eye exhibits no discharge. Left eye exhibits no discharge.  Neck: Normal range of motion. Neck supple. No JVD present. No tracheal deviation present. No thyromegaly present.  Cardiovascular: Normal rate, regular rhythm and normal heart sounds.   Pulmonary/Chest: No stridor. No respiratory distress. She has no wheezes.  Abdominal: Soft. Bowel sounds are normal. She exhibits no distension and no mass. There is no tenderness. There is no rebound and no guarding.  Musculoskeletal: She exhibits no edema or  tenderness.  Lymphadenopathy:    She has no cervical adenopathy.  Neurological: She displays normal reflexes. No cranial nerve deficit. She exhibits normal muscle tone. Coordination normal.  Skin: No rash noted. No erythema.  Psychiatric: Her behavior is normal. Judgment and thought content normal.   H-P (-) B  Supine BP 130/80  Standing 120/75 - lightheaded  Ataxic a little   Lab Results  Component Value Date   WBC 6.6 01/20/2017   HGB 13.8 01/20/2017   HCT 41.3 01/20/2017   PLT 210.0 01/20/2017   GLUCOSE 91 01/20/2017   CHOL 194 01/20/2017   TRIG 59.0 01/20/2017   HDL 86.40 01/20/2017   LDLCALC 96 01/20/2017   ALT 14 01/20/2017   AST 15 01/20/2017   NA 139 01/20/2017   K 3.7 01/20/2017   CL 103 01/20/2017   CREATININE 0.65 01/20/2017   BUN 12 01/20/2017   CO2 29 01/20/2017   TSH 1.80 01/20/2017    Mr Brain W Wo Contrast  Result Date: 04/08/2017 CLINICAL DATA:  Tiredness. Cold intolerance, non intractable headache. Weakness and mild confusion. EXAM: MRI HEAD WITHOUT AND WITH CONTRAST TECHNIQUE: Multiplanar, multiecho pulse sequences of the brain and surrounding structures were obtained without and with intravenous contrast. CONTRAST:  MultiHance 6 mL. COMPARISON:  None. FINDINGS: Brain: No evidence for acute infarction, hemorrhage, mass lesion, hydrocephalus, or extra-axial fluid. Moderate cerebral and cerebellar atrophy. Mild to moderate T2 and FLAIR hyperintensities in the white matter, likely small vessel disease. Thin-section imaging was obtained before, during, and after bolus infusion of contrast. Posterior pituitary gland is preserved. The anterior pituitary is mildly atrophic. The stalk is midline. The gland demonstrates homogeneous enhancement, including the stalk. No areas of differential enhancement are seen. Normal cavernous sinuses and suprasellar cistern. Post infusion imaging through the entire head demonstrates no abnormal enhancement of the brain or meninges.  Vascular: Flow voids are maintained throughout the carotid, basilar, and vertebral arteries. Moderate dolichoectasia consistent with advanced age. The cavernous internal carotid arteries in the parasellar region have a normal appearance. Skull and upper cervical spine: Unremarkable clivus, midline intracranial structures, and cerebellar tonsils. Sinuses/Orbits: Negative. Other: None. IMPRESSION: The pituitary appears mildly atrophic, but there are no features suggestive of microadenoma, macroadenoma, or parasellar mass. Moderate atrophy and small vessel disease. No hypothalamic abnormality. No acute or focal intracranial findings. Normal postcontrast appearance of the brain. Electronically Signed   By: Elsie Stain M.D.   On: 04/08/2017 19:54    Assessment & Plan:   There are no diagnoses linked to this encounter. I have discontinued Ms. Mastrianni's co-enzyme Q-10 and venlafaxine XR. I am also having her maintain her Vitamin D3, SUPER B COMPLEX, multivitamin with minerals, folic acid, OVER THE COUNTER MEDICATION, aspirin EC, traZODone, LOTEMAX, linaclotide, magnesium, clonazePAM, CoQ-10, and TURMERIC CURCUMIN PO. We will continue to administer sodium chloride.  Meds ordered this encounter  Medications  . Coenzyme Q10 (COQ-10) 100 MG CAPS  . TURMERIC CURCUMIN PO     Follow-up: No Follow-up on file.  Sonda Primes, MD

## 2017-06-19 ENCOUNTER — Encounter: Payer: Self-pay | Admitting: Internal Medicine

## 2017-06-19 ENCOUNTER — Ambulatory Visit (INDEPENDENT_AMBULATORY_CARE_PROVIDER_SITE_OTHER): Payer: Medicare HMO | Admitting: Internal Medicine

## 2017-06-19 ENCOUNTER — Other Ambulatory Visit (INDEPENDENT_AMBULATORY_CARE_PROVIDER_SITE_OTHER): Payer: Medicare HMO

## 2017-06-19 VITALS — BP 108/70 | HR 58 | Temp 97.7°F | Ht 61.0 in | Wt 125.0 lb

## 2017-06-19 DIAGNOSIS — E7800 Pure hypercholesterolemia, unspecified: Secondary | ICD-10-CM

## 2017-06-19 DIAGNOSIS — R5382 Chronic fatigue, unspecified: Secondary | ICD-10-CM

## 2017-06-19 DIAGNOSIS — Z23 Encounter for immunization: Secondary | ICD-10-CM | POA: Diagnosis not present

## 2017-06-19 DIAGNOSIS — R42 Dizziness and giddiness: Secondary | ICD-10-CM

## 2017-06-19 DIAGNOSIS — M797 Fibromyalgia: Secondary | ICD-10-CM

## 2017-06-19 LAB — BASIC METABOLIC PANEL
BUN: 9 mg/dL (ref 6–23)
CHLORIDE: 101 meq/L (ref 96–112)
CO2: 29 meq/L (ref 19–32)
Calcium: 9.6 mg/dL (ref 8.4–10.5)
Creatinine, Ser: 0.67 mg/dL (ref 0.40–1.20)
GFR: 91.6 mL/min (ref >60.00)
Glucose, Bld: 88 mg/dL (ref 70–99)
POTASSIUM: 4 meq/L (ref 3.5–5.1)
Sodium: 138 mEq/L (ref 135–145)

## 2017-06-19 LAB — LIPID PANEL
CHOLESTEROL: 260 mg/dL — AB (ref 0–200)
HDL: 70.6 mg/dL (ref >39.00)
LDL Cholesterol: 170 mg/dL — ABNORMAL HIGH (ref 0–99)
NonHDL: 188.94
TRIGLYCERIDES: 96 mg/dL (ref 0.0–149.0)
Total CHOL/HDL Ratio: 4
VLDL: 19.2 mg/dL (ref 0.0–40.0)

## 2017-06-19 LAB — CORTISOL: CORTISOL PLASMA: 19 ug/dL

## 2017-06-19 NOTE — Assessment & Plan Note (Signed)
Doing better ENT appt next week

## 2017-06-19 NOTE — Patient Instructions (Signed)
Savella for fibromyalgia Low dose adderall for fatigue

## 2017-06-19 NOTE — Addendum Note (Signed)
Addended by: Karren Cobble on: 06/19/2017 11:58 AM   Modules accepted: Orders

## 2017-06-19 NOTE — Assessment & Plan Note (Addendum)
CFS - options discussed  Savella for fibromyalgia Low dose adderall for fatigue discussed

## 2017-06-19 NOTE — Assessment & Plan Note (Signed)
Labs

## 2017-06-19 NOTE — Assessment & Plan Note (Addendum)
Savella discussed  Gluten free trial did not help

## 2017-06-19 NOTE — Progress Notes (Signed)
Subjective:  Patient ID: Shannon Sutton, female    DOB: 1946-04-24  Age: 71 y.o. MRN: [id number]  CC: No chief complaint on file.   HPI Shannon Sutton presents for dizziness, fatigue, apathy and depression - no change C/o neck pain  Outpatient Medications Prior to Visit  Medication Sig Dispense Refill  . aspirin EC 81 MG tablet Take 81 mg by mouth daily.    . B Complex-C (SUPER B COMPLEX) TABS Take 1 tablet by mouth daily.     . Cholecalciferol (VITAMIN D3) 2000 UNITS TABS Take 1 tablet by mouth daily.      . clonazePAM (KLONOPIN) 0.5 MG tablet TAKE 1 TO 2 TABLETS BY MOUTH AT BEDTIME AS NEEDED 60 tablet 0  . Coenzyme Q10 (COQ-10) 100 MG CAPS     . cyclobenzaprine (FLEXERIL) 5 MG tablet Take 1 tablet (5 mg total) by mouth 2 (two) times daily as needed for muscle spasms. 30 tablet 3  . FLUoxetine (PROZAC) 20 MG tablet Take 1 tablet (20 mg total) by mouth daily. 30 tablet 5  . folic acid (FOLVITE) 800 MCG tablet Take 400 mcg by mouth daily.    Marland Kitchen linaclotide (LINZESS) 290 MCG CAPS capsule Take 1 capsule (290 mcg total) by mouth daily. 90 capsule 3  . LOTEMAX 0.5 % GEL as needed.    . magnesium 30 MG tablet Take 30 mg by mouth 2 (two) times daily.    . Multiple Vitamins-Minerals (MULTIVITAMIN WITH MINERALS) tablet Take 1 tablet by mouth daily.      Marland Kitchen OVER THE COUNTER MEDICATION Take 1 tablet by mouth 2 (two) times daily. BAYER BACK AND BODY WITH [id number]    . traZODone (DESYREL) 50 MG tablet TAKE ONE TABLET BY MOUTH ONCE DAILY AT BEDTIME 90 tablet 3  . TURMERIC CURCUMIN PO      Facility-Administered Medications Prior to Visit  Medication Dose Route Frequency Provider Last Rate Last Dose  . 0.9 %  sodium chloride infusion  500 mL Intravenous Continuous Danis, Starr Lake III, MD        ROS Review of Systems  Constitutional: Positive for fatigue. Negative for activity change, appetite change, chills and unexpected weight change.  HENT: Negative for congestion, mouth sores and sinus  pressure.   Eyes: Negative for visual disturbance.  Respiratory: Negative for cough and chest tightness.   Gastrointestinal: Negative for abdominal pain and nausea.  Genitourinary: Negative for difficulty urinating, frequency and vaginal pain.  Musculoskeletal: Positive for neck stiffness. Negative for back pain and gait problem.  Skin: Negative for pallor and rash.  Neurological: Positive for dizziness and weakness. Negative for tremors, numbness and headaches.  Psychiatric/Behavioral: Positive for decreased concentration. Negative for confusion, sleep disturbance and suicidal ideas.    Objective:  BP 108/70 (BP Location: Left Arm, Patient Position: Sitting, Cuff Size: Normal)   Pulse (!) 58   Temp 97.7 F (36.5 C) (Oral)   Ht 5\' 1"  (1.549 m)   Wt 125 lb (56.7 kg)   SpO2 98%   BMI 23.62 kg/m   BP Readings from Last 3 Encounters:  06/19/17 108/70  05/18/17 110/65  03/06/17 120/78    Wt Readings from Last 3 Encounters:  06/19/17 125 lb (56.7 kg)  05/18/17 123 lb (55.8 kg)  03/06/17 123 lb (55.8 kg)    Physical Exam  Constitutional: She appears well-developed. No distress.  HENT:  Head: Normocephalic.  Right Ear: External ear normal.  Left Ear: External ear normal.  Nose: Nose normal.  Mouth/Throat: Oropharynx is clear and moist.  Eyes: Pupils are equal, round, and reactive to light. Conjunctivae are normal. Right eye exhibits no discharge. Left eye exhibits no discharge.  Neck: Normal range of motion. Neck supple. No JVD present. No tracheal deviation present. No thyromegaly present.  Cardiovascular: Normal rate, regular rhythm and normal heart sounds.   Pulmonary/Chest: No stridor. No respiratory distress. She has no wheezes.  Abdominal: Soft. Bowel sounds are normal. She exhibits no distension and no mass. There is no tenderness. There is no rebound and no guarding.  Musculoskeletal: She exhibits no edema or tenderness.  Lymphadenopathy:    She has no cervical  adenopathy.  Neurological: She displays normal reflexes. No cranial nerve deficit. She exhibits normal muscle tone. Coordination normal.  Skin: No rash noted. No erythema.  Psychiatric: Her behavior is normal. Judgment and thought content normal.  Looks well  Lab Results  Component Value Date   WBC 6.6 01/20/2017   HGB 13.8 01/20/2017   HCT 41.3 01/20/2017   PLT 210.0 01/20/2017   GLUCOSE 91 01/20/2017   CHOL 194 01/20/2017   TRIG 59.0 01/20/2017   HDL 86.40 01/20/2017   LDLCALC 96 01/20/2017   ALT 14 01/20/2017   AST 15 01/20/2017   NA 139 01/20/2017   K 3.7 01/20/2017   CL 103 01/20/2017   CREATININE 0.65 01/20/2017   BUN 12 01/20/2017   CO2 29 01/20/2017   TSH 1.80 01/20/2017    Mr Brain W Wo Contrast  Result Date: 04/08/2017 CLINICAL DATA:  Tiredness. Cold intolerance, non intractable headache. Weakness and mild confusion. EXAM: MRI HEAD WITHOUT AND WITH CONTRAST TECHNIQUE: Multiplanar, multiecho pulse sequences of the brain and surrounding structures were obtained without and with intravenous contrast. CONTRAST:  MultiHance 6 mL. COMPARISON:  None. FINDINGS: Brain: No evidence for acute infarction, hemorrhage, mass lesion, hydrocephalus, or extra-axial fluid. Moderate cerebral and cerebellar atrophy. Mild to moderate T2 and FLAIR hyperintensities in the white matter, likely small vessel disease. Thin-section imaging was obtained before, during, and after bolus infusion of contrast. Posterior pituitary gland is preserved. The anterior pituitary is mildly atrophic. The stalk is midline. The gland demonstrates homogeneous enhancement, including the stalk. No areas of differential enhancement are seen. Normal cavernous sinuses and suprasellar cistern. Post infusion imaging through the entire head demonstrates no abnormal enhancement of the brain or meninges. Vascular: Flow voids are maintained throughout the carotid, basilar, and vertebral arteries. Moderate dolichoectasia consistent  with advanced age. The cavernous internal carotid arteries in the parasellar region have a normal appearance. Skull and upper cervical spine: Unremarkable clivus, midline intracranial structures, and cerebellar tonsils. Sinuses/Orbits: Negative. Other: None. IMPRESSION: The pituitary appears mildly atrophic, but there are no features suggestive of microadenoma, macroadenoma, or parasellar mass. Moderate atrophy and small vessel disease. No hypothalamic abnormality. No acute or focal intracranial findings. Normal postcontrast appearance of the brain. Electronically Signed   By: Elsie Stain M.D.   On: 04/08/2017 19:54    Assessment & Plan:   There are no diagnoses linked to this encounter. I am having Ms. Dayton Scrape maintain her Vitamin D3, SUPER B COMPLEX, multivitamin with minerals, folic acid, OVER THE COUNTER MEDICATION, aspirin EC, traZODone, LOTEMAX, linaclotide, magnesium, clonazePAM, CoQ-10, TURMERIC CURCUMIN PO, cyclobenzaprine, and FLUoxetine. We will continue to administer sodium chloride.  No orders of the defined types were placed in this encounter.    Follow-up: No Follow-up on file.  Sonda Primes, MD

## 2017-06-21 ENCOUNTER — Encounter: Payer: Self-pay | Admitting: Internal Medicine

## 2017-06-22 ENCOUNTER — Other Ambulatory Visit: Payer: Self-pay | Admitting: Internal Medicine

## 2017-06-22 ENCOUNTER — Encounter: Payer: Self-pay | Admitting: Internal Medicine

## 2017-06-22 MED ORDER — ROSUVASTATIN CALCIUM 5 MG PO TABS: 5.0000 mg | ORAL_TABLET | Freq: Every day | ORAL | 11 refills | Status: DC

## 2017-06-23 DIAGNOSIS — H903 Sensorineural hearing loss, bilateral: Secondary | ICD-10-CM | POA: Diagnosis not present

## 2017-06-23 DIAGNOSIS — R42 Dizziness and giddiness: Secondary | ICD-10-CM | POA: Diagnosis not present

## 2017-06-23 DIAGNOSIS — Z7289 Other problems related to lifestyle: Secondary | ICD-10-CM | POA: Diagnosis not present

## 2017-06-23 DIAGNOSIS — Z87891 Personal history of nicotine dependence: Secondary | ICD-10-CM | POA: Diagnosis not present

## 2017-06-26 LAB — ALDOLASE: Aldolase: 2.6 U/L (ref <=8.1)

## 2017-06-29 ENCOUNTER — Other Ambulatory Visit: Payer: Self-pay | Admitting: Nurse Practitioner

## 2017-07-01 MED ORDER — CLONAZEPAM 0.5 MG PO TABS: 0.5000 mg | ORAL_TABLET | Freq: Every evening | ORAL | 1 refills | Status: DC | PRN

## 2017-07-02 ENCOUNTER — Encounter: Payer: Self-pay | Admitting: Internal Medicine

## 2017-07-03 ENCOUNTER — Encounter: Payer: Self-pay | Admitting: Internal Medicine

## 2017-07-07 ENCOUNTER — Other Ambulatory Visit: Payer: Self-pay | Admitting: Internal Medicine

## 2017-07-07 MED ORDER — VENLAFAXINE HCL ER 75 MG PO CP24: 75.0000 mg | ORAL_CAPSULE | Freq: Every day | ORAL | 5 refills | Status: DC

## 2017-07-13 DIAGNOSIS — H40013 Open angle with borderline findings, low risk, bilateral: Secondary | ICD-10-CM | POA: Diagnosis not present

## 2017-07-13 DIAGNOSIS — H16223 Keratoconjunctivitis sicca, not specified as Sjogren's, bilateral: Secondary | ICD-10-CM | POA: Diagnosis not present

## 2017-07-13 DIAGNOSIS — H35373 Puckering of macula, bilateral: Secondary | ICD-10-CM | POA: Diagnosis not present

## 2017-08-04 ENCOUNTER — Other Ambulatory Visit: Payer: Self-pay | Admitting: Internal Medicine

## 2017-08-04 DIAGNOSIS — Z1231 Encounter for screening mammogram for malignant neoplasm of breast: Secondary | ICD-10-CM

## 2017-08-05 ENCOUNTER — Ambulatory Visit
Admission: RE | Admit: 2017-08-05 | Discharge: 2017-08-05 | Disposition: A | Discharge status: Home / Self Care | Payer: Medicare HMO | Source: Ambulatory Visit | Attending: Internal Medicine | Admitting: Internal Medicine

## 2017-08-05 DIAGNOSIS — Z1231 Encounter for screening mammogram for malignant neoplasm of breast: Secondary | ICD-10-CM

## 2017-08-17 ENCOUNTER — Encounter: Payer: Self-pay | Admitting: Internal Medicine

## 2017-08-17 ENCOUNTER — Ambulatory Visit (INDEPENDENT_AMBULATORY_CARE_PROVIDER_SITE_OTHER): Payer: Medicare HMO | Admitting: Internal Medicine

## 2017-08-17 ENCOUNTER — Other Ambulatory Visit (INDEPENDENT_AMBULATORY_CARE_PROVIDER_SITE_OTHER): Payer: Medicare HMO

## 2017-08-17 DIAGNOSIS — M797 Fibromyalgia: Secondary | ICD-10-CM

## 2017-08-17 DIAGNOSIS — F329 Major depressive disorder, single episode, unspecified: Secondary | ICD-10-CM | POA: Diagnosis not present

## 2017-08-17 DIAGNOSIS — R5382 Chronic fatigue, unspecified: Secondary | ICD-10-CM | POA: Diagnosis not present

## 2017-08-17 DIAGNOSIS — E785 Hyperlipidemia, unspecified: Secondary | ICD-10-CM | POA: Diagnosis not present

## 2017-08-17 DIAGNOSIS — R69 Illness, unspecified: Secondary | ICD-10-CM | POA: Diagnosis not present

## 2017-08-17 LAB — LIPID PANEL
CHOL/HDL RATIO: 2
Cholesterol: 174 mg/dL (ref 0–200)
HDL: 75.2 mg/dL (ref >39.00)
LDL CALC: 87 mg/dL (ref 0–99)
NONHDL: 99.21
TRIGLYCERIDES: 62 mg/dL (ref 0.0–149.0)
VLDL: 12.4 mg/dL (ref 0.0–40.0)

## 2017-08-17 LAB — BASIC METABOLIC PANEL
BUN: 13 mg/dL (ref 6–23)
CO2: 28 mEq/L (ref 19–32)
Calcium: 9.7 mg/dL (ref 8.4–10.5)
Chloride: 101 mEq/L (ref 96–112)
Creatinine, Ser: 0.65 mg/dL (ref 0.40–1.20)
GFR: 94.81 mL/min (ref >60.00)
Glucose, Bld: 85 mg/dL (ref 70–99)
POTASSIUM: 4.1 meq/L (ref 3.5–5.1)
SODIUM: 139 meq/L (ref 135–145)

## 2017-08-17 MED ORDER — ZOSTER VAC RECOMB ADJUVANTED 50 MCG/0.5ML IM SUSR: 0.5000 mL | Freq: Once | INTRAMUSCULAR | 1 refills | Status: AC

## 2017-08-17 NOTE — Assessment & Plan Note (Signed)
Better w/dry needling

## 2017-08-17 NOTE — Assessment & Plan Note (Signed)
Crestor 5 mg/d Labs

## 2017-08-17 NOTE — Assessment & Plan Note (Signed)
Re-started Effexor Fluoxetine -d/c Psychology ref Start to travel

## 2017-08-17 NOTE — Progress Notes (Signed)
Subjective:  Patient ID: Shannon Sutton, female    DOB: 1946/09/22  Age: 71 y.o. MRN: [id number]  CC: No chief complaint on file.   HPI NADERA HOLLINGER presents for depression, CFS, FMS f/u. Dry needling helped...  Outpatient Medications Prior to Visit  Medication Sig Dispense Refill  . aspirin EC 81 MG tablet Take 81 mg by mouth daily.    . B Complex-C (SUPER B COMPLEX) TABS Take 1 tablet by mouth daily.     . Cholecalciferol (VITAMIN D3) 2000 UNITS TABS Take 1 tablet by mouth daily.      . clonazePAM (KLONOPIN) 0.5 MG tablet Take 1-2 tablets (0.5-1 mg total) by mouth at bedtime as needed. 60 tablet 1  . Coenzyme Q10 (COQ-10) 100 MG CAPS     . cyclobenzaprine (FLEXERIL) 5 MG tablet Take 1 tablet (5 mg total) by mouth 2 (two) times daily as needed for muscle spasms. 30 tablet 3  . FLUoxetine (PROZAC) 20 MG tablet Take 1 tablet (20 mg total) by mouth daily. 30 tablet 5  . folic acid (FOLVITE) 800 MCG tablet Take 400 mcg by mouth daily.    Marland Kitchen linaclotide (LINZESS) 290 MCG CAPS capsule Take 1 capsule (290 mcg total) by mouth daily. 90 capsule 3  . LOTEMAX 0.5 % GEL as needed.    . magnesium 30 MG tablet Take 30 mg by mouth 2 (two) times daily.    . Multiple Vitamins-Minerals (MULTIVITAMIN WITH MINERALS) tablet Take 1 tablet by mouth daily.      Marland Kitchen OVER THE COUNTER MEDICATION Take 1 tablet by mouth 2 (two) times daily. BAYER BACK AND BODY WITH CAFFEINE1000-650    . rosuvastatin (CRESTOR) 5 MG tablet Take 1 tablet (5 mg total) by mouth daily. 30 tablet 11  . traZODone (DESYREL) 50 MG tablet TAKE ONE TABLET BY MOUTH ONCE DAILY AT BEDTIME 90 tablet 3  . TURMERIC CURCUMIN PO     . venlafaxine XR (EFFEXOR XR) 75 MG 24 hr capsule Take 1 capsule (75 mg total) by mouth daily with breakfast. 30 capsule 5   Facility-Administered Medications Prior to Visit  Medication Dose Route Frequency Provider Last Rate Last Dose  . 0.9 %  sodium chloride infusion  500 mL Intravenous Continuous Danis, Starr Lake III,  MD        ROS Review of Systems  Constitutional: Negative for activity change, appetite change, chills, fatigue and unexpected weight change.  HENT: Negative for congestion, mouth sores and sinus pressure.   Eyes: Negative for visual disturbance.  Respiratory: Negative for cough and chest tightness.   Gastrointestinal: Negative for abdominal pain and nausea.  Genitourinary: Negative for difficulty urinating, frequency and vaginal pain.  Musculoskeletal: Negative for back pain and gait problem.  Skin: Negative for pallor and rash.  Neurological: Negative for dizziness, tremors, weakness, numbness and headaches.  Psychiatric/Behavioral: Positive for dysphoric mood. Negative for confusion, sleep disturbance and suicidal ideas. The patient is not nervous/anxious.     Objective:  BP 112/72 (BP Location: Left Arm, Patient Position: Sitting, Cuff Size: Normal)   Pulse 73   Temp 98.5 F (36.9 C) (Oral)   Ht 5\' 1"  (1.549 m)   Wt 126 lb (57.2 kg)   SpO2 97%   BMI 23.81 kg/m   BP Readings from Last 3 Encounters:  08/17/17 112/72  06/19/17 108/70  05/18/17 110/65    Wt Readings from Last 3 Encounters:  08/17/17 126 lb (57.2 kg)  06/19/17 125 lb (56.7 kg)  05/18/17  123 lb (55.8 kg)    Physical Exam  Constitutional: She appears well-developed. No distress.  HENT:  Head: Normocephalic.  Right Ear: External ear normal.  Left Ear: External ear normal.  Nose: Nose normal.  Mouth/Throat: Oropharynx is clear and moist.  Eyes: Conjunctivae are normal. Pupils are equal, round, and reactive to light. Right eye exhibits no discharge. Left eye exhibits no discharge.  Neck: Normal range of motion. Neck supple. No JVD present. No tracheal deviation present. No thyromegaly present.  Cardiovascular: Normal rate, regular rhythm and normal heart sounds.  Pulmonary/Chest: No stridor. No respiratory distress. She has no wheezes.  Abdominal: Soft. Bowel sounds are normal. She exhibits no distension  and no mass. There is no tenderness. There is no rebound and no guarding.  Musculoskeletal: She exhibits no edema or tenderness.  Lymphadenopathy:    She has no cervical adenopathy.  Neurological: She displays normal reflexes. No cranial nerve deficit. She exhibits normal muscle tone.  Skin: No rash noted. No erythema.  Psychiatric: Judgment and thought content normal.  Sad, tearful Depressed   Lab Results  Component Value Date   WBC 6.6 01/20/2017   HGB 13.8 01/20/2017   HCT 41.3 01/20/2017   PLT 210.0 01/20/2017   GLUCOSE 88 06/19/2017   CHOL 260 (H) 06/19/2017   TRIG 96.0 06/19/2017   HDL 70.60 06/19/2017   LDLCALC 170 (H) 06/19/2017   ALT 14 01/20/2017   AST 15 01/20/2017   NA 138 06/19/2017   K 4.0 06/19/2017   CL 101 06/19/2017   CREATININE 0.67 06/19/2017   BUN 9 06/19/2017   CO2 29 06/19/2017   TSH 1.80 01/20/2017    Mm Screening Breast Tomo Bilateral  Result Date: 08/05/2017 CLINICAL DATA:  Screening. EXAM: 2D DIGITAL SCREENING BILATERAL MAMMOGRAM WITH CAD AND ADJUNCT TOMO COMPARISON:  Previous exam(s). ACR Breast Density Category b: There are scattered areas of fibroglandular density. FINDINGS: There are no findings suspicious for malignancy. Images were processed with CAD. IMPRESSION: No mammographic evidence of malignancy. A result letter of this screening mammogram will be mailed directly to the patient. RECOMMENDATION: Screening mammogram in one year. (Code:SM-B-01Y) BI-RADS CATEGORY  1: Negative. Electronically Signed   By: Amie Portland M.D.   On: 08/05/2017 14:28    Assessment & Plan:   There are no diagnoses linked to this encounter. I am having Trudee Grip maintain her Vitamin D3, SUPER B COMPLEX, multivitamin with minerals, folic acid, OVER THE COUNTER MEDICATION, aspirin EC, traZODone, LOTEMAX, linaclotide, magnesium, CoQ-10, TURMERIC CURCUMIN PO, cyclobenzaprine, FLUoxetine, rosuvastatin, clonazePAM, and venlafaxine XR. We will continue to administer  sodium chloride.  No orders of the defined types were placed in this encounter.    Follow-up: No Follow-up on file.  Sonda Primes, MD

## 2017-08-17 NOTE — Assessment & Plan Note (Signed)
Discussed.

## 2017-09-10 ENCOUNTER — Encounter: Payer: Self-pay | Admitting: Internal Medicine

## 2017-09-14 ENCOUNTER — Other Ambulatory Visit: Payer: Self-pay | Admitting: Internal Medicine

## 2017-09-14 MED ORDER — ROPINIROLE HCL 0.25 MG PO TABS: 0.2500 mg | ORAL_TABLET | Freq: Every day | ORAL | 5 refills | Status: DC

## 2017-09-21 ENCOUNTER — Encounter: Payer: Self-pay | Admitting: Internal Medicine

## 2017-09-21 ENCOUNTER — Other Ambulatory Visit: Payer: Self-pay | Admitting: Internal Medicine

## 2017-09-23 ENCOUNTER — Other Ambulatory Visit: Payer: Self-pay | Admitting: Internal Medicine

## 2017-09-23 MED ORDER — CLONAZEPAM 0.5 MG PO TABS: 0.5000 mg | ORAL_TABLET | Freq: Every evening | ORAL | 2 refills | Status: DC | PRN

## 2017-09-23 NOTE — Telephone Encounter (Signed)
Check [place] registry last filled 08/11/2017...Shannon Sutton

## 2017-10-02 ENCOUNTER — Encounter: Payer: Self-pay | Admitting: Internal Medicine

## 2017-10-05 ENCOUNTER — Other Ambulatory Visit: Payer: Self-pay | Admitting: Internal Medicine

## 2017-10-05 DIAGNOSIS — M6281 Muscle weakness (generalized): Secondary | ICD-10-CM

## 2017-10-19 ENCOUNTER — Encounter: Payer: Self-pay | Admitting: Internal Medicine

## 2017-10-30 IMAGING — MR MR HEAD WO/W CM
19 series · 46 of 48 positions shown · IV contrast (multihance)
Comparison: None.

CLINICAL DATA: Tiredness. Cold intolerance, non intractable
headache. Weakness and mild confusion.

EXAM:
MRI HEAD WITHOUT AND WITH CONTRAST
TECHNIQUE: Multiplanar, multiecho pulse sequences of the brain and surrounding
structures were obtained without and with intravenous contrast.
CONTRAST:  MultiHance 6 mL.

[Series 5: T1 · sagittal · 4.0mm · 0.72mm/px · 1 of 29 slices shown (1 of 5)]
[im 1/29]
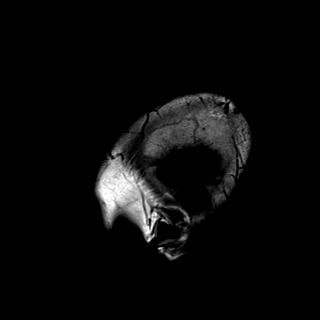

[Series 6: DWI · axial · 3.0mm · 1.44mm/px · z∈[-37,+122]mm · 6 of 84 slices shown (1 of 2)]
[im 1/84]
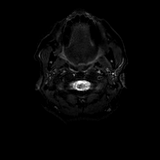
[im 17/84]
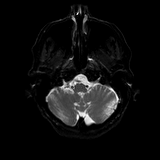
[im 34/84]
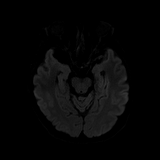
[im 50/84]
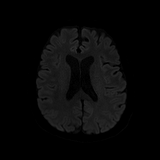
[im 67/84]
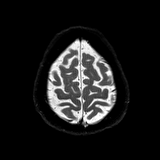
[im 84/84]
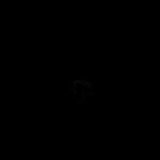

[Series 7: DWI · axial · 3.0mm · 1.44mm/px · z∈[-37,+122]mm · 4 of 42 slices shown (2 of 2)]
[im 1/42]
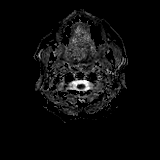
[im 14/42]
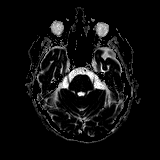
[im 28/42]
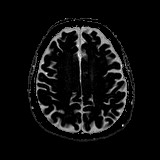
[im 42/42]
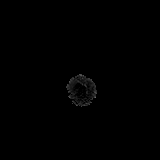

[Series 8: T2 · axial · 4.0mm · 0.36mm/px · z∈[-30,+120]mm · 3 of 30 slices shown]
[im 1/30]
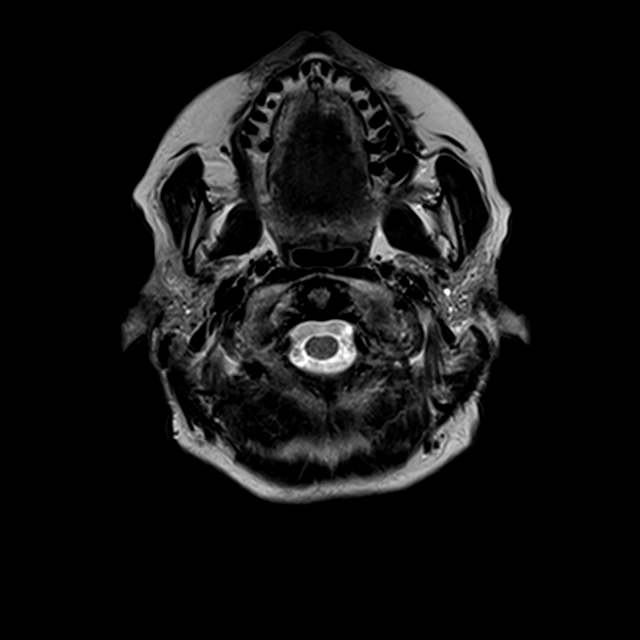
[im 15/30]
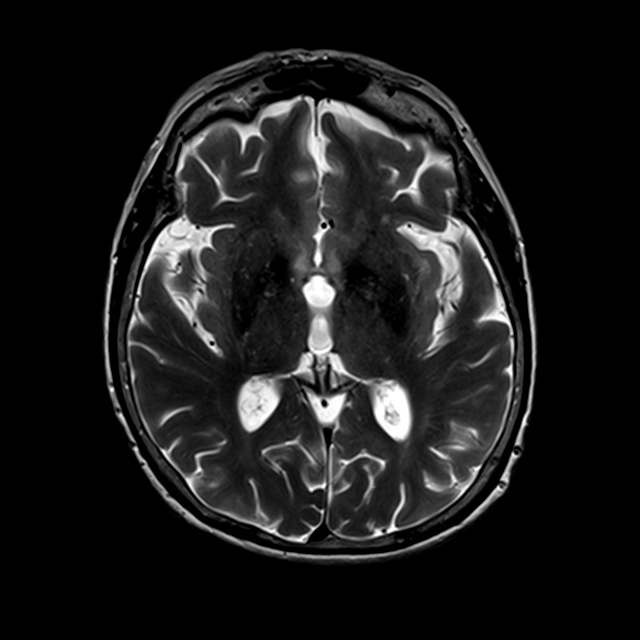
[im 30/30]
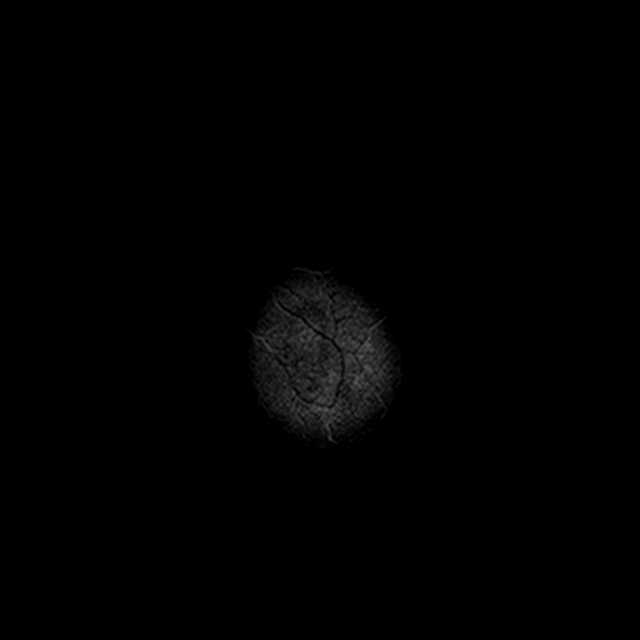

[Series 9: GRE · axial · 4.0mm · 0.69mm/px · z∈[-30,+120]mm · 3 of 30 slices shown]
[im 1/30]
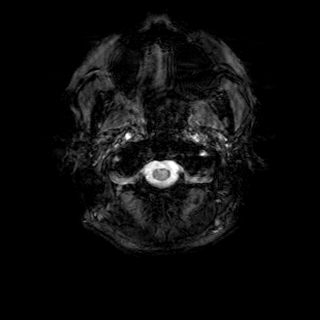
[im 15/30]
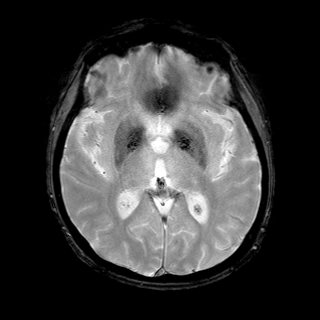
[im 30/30]
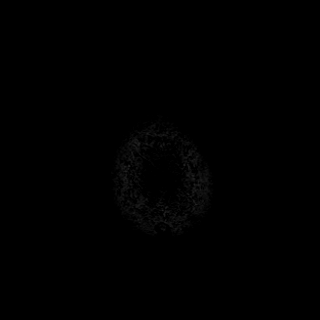

[Series 10: FLAIR · axial · 3.0mm · 0.72mm/px · z∈[-23,+114]mm · 2 of 24 slices shown]
[im 1/24]
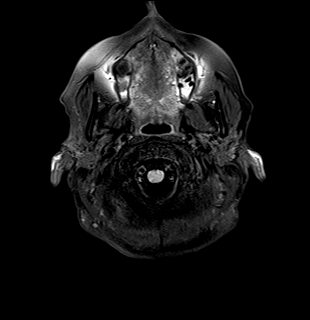
[im 24/24]
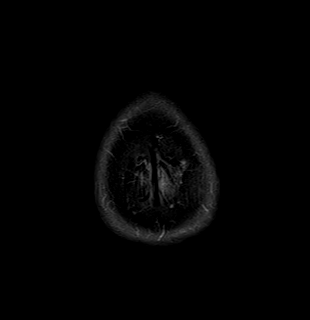

[Series 11: T1 · sagittal · 3.0mm · 0.42mm/px · 1 of 13 slices shown (2 of 5)]
[im 1/13]
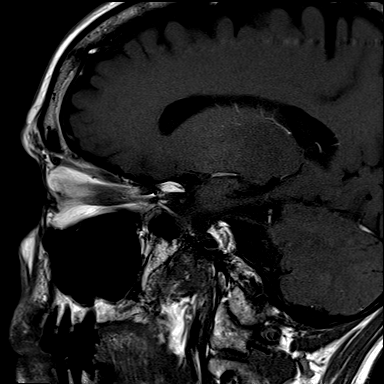

[Series 12: T1 · coronal · 3.0mm · 0.42mm/px · 1 of 10 slices shown (3 of 5)]
[im 1/10]
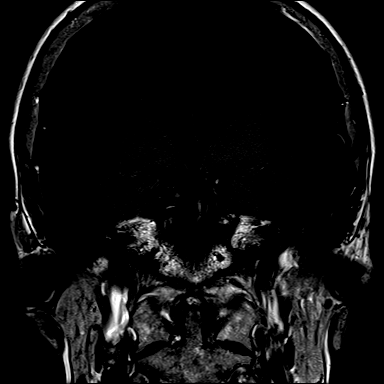

[Series 13: T1 · coronal · non-contrast · 3.0mm · 0.62mm/px · 1 of 9 slices shown (4 of 5)]
[im 1/9]
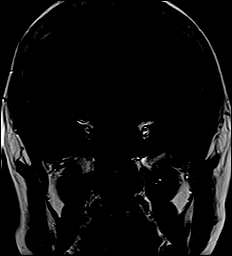

[Series 14: cor post dyn · coronal · 3.0mm · 0.62mm/px · 1 of 9 slices shown (1 of 6)]
[im 1/9]
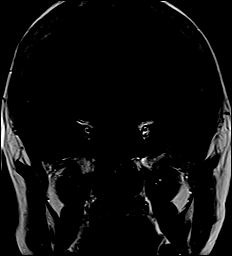

[Series 15: cor post dyn · coronal · 3.0mm · 0.62mm/px · 1 of 9 slices shown (2 of 6)]
[im 1/9]
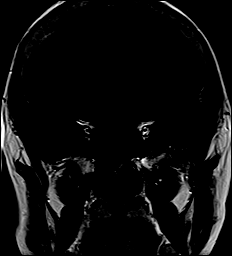

[Series 16: cor post dyn · coronal · 3.0mm · 0.62mm/px · 1 of 9 slices shown (3 of 6)]
[im 1/9]
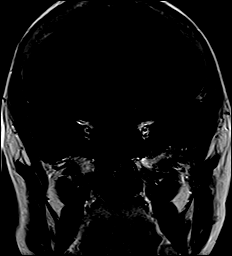

[Series 17: cor post dyn · coronal · 3.0mm · 0.62mm/px · 1 of 9 slices shown (4 of 6)]
[im 1/9]
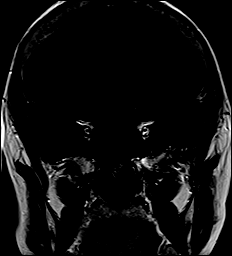

[Series 18: cor post dyn · coronal · 3.0mm · 0.62mm/px · 1 of 9 slices shown (5 of 6)]
[im 1/9]
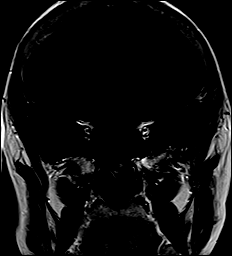

[Series 19: cor post dyn · coronal · 3.0mm · 0.62mm/px · 1 of 9 slices shown (6 of 6)]
[im 1/9]
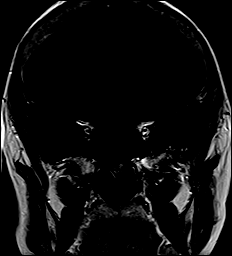

[Series 20: T1 post-contrast · sagittal · 3.0mm · 0.42mm/px · 1 of 13 slices shown (1 of 3)]
[im 1/13]
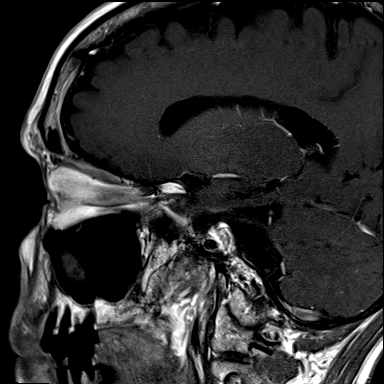

[Series 21: T1 post-contrast · coronal · 3.0mm · 0.42mm/px · 1 of 10 slices shown (2 of 3)]
[im 1/10]
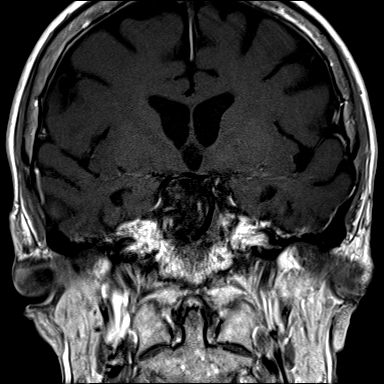

[Series 22: T1 · axial · 1.0mm · 0.86mm/px · z∈[-45,+129]mm · 13 of 176 slices shown (5 of 5)]
[im 1/176]
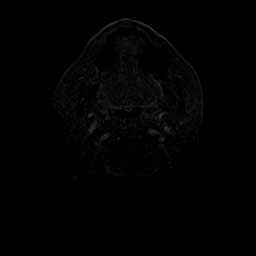
[im 13/176]
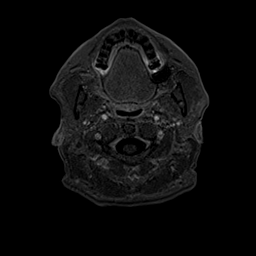
[im 26/176]
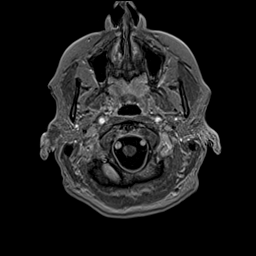
[im 38/176]
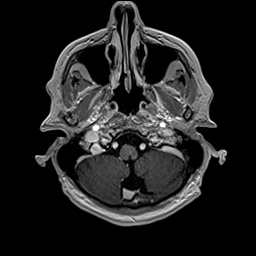
[im 51/176]
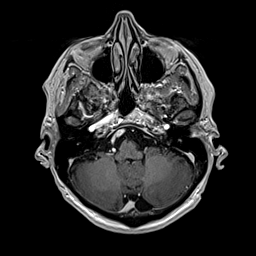
[im 63/176]
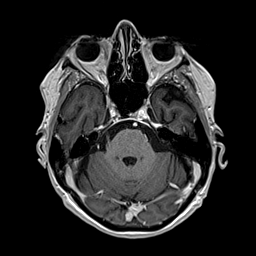
[im 76/176]
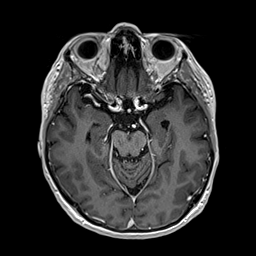
[im 88/176]
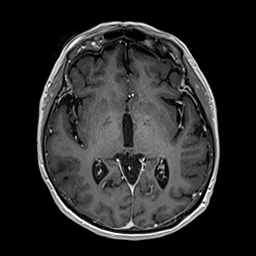
[im 101/176]
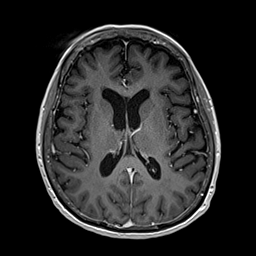
[im 113/176]
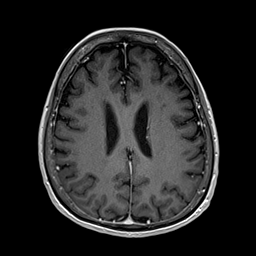
[im 126/176]
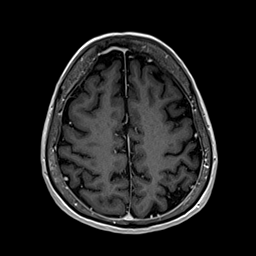
[im 151/176]
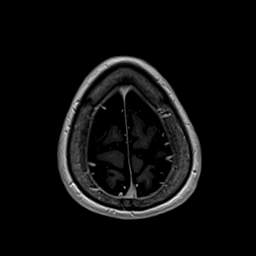
[im 176/176]
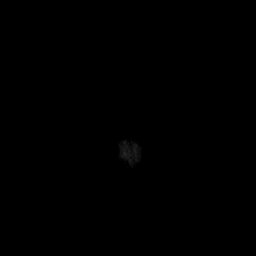

[Series 23: T1 post-contrast · coronal · 4.0mm · 0.47mm/px · 3 of 33 slices shown (3 of 3)]
[im 1/33]
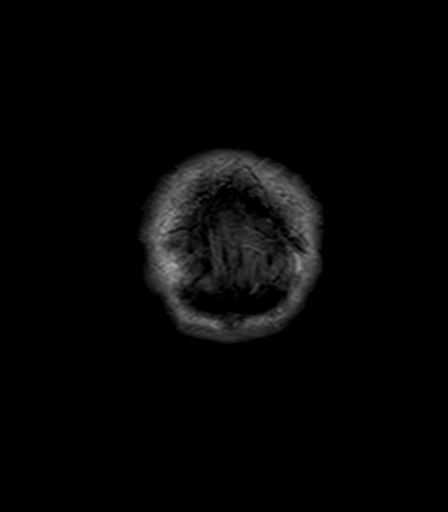
[im 17/33]
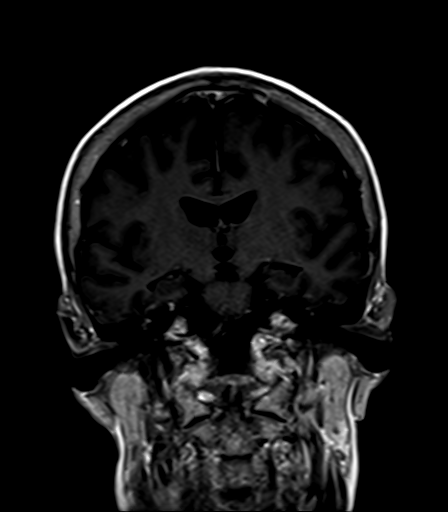
[im 33/33]
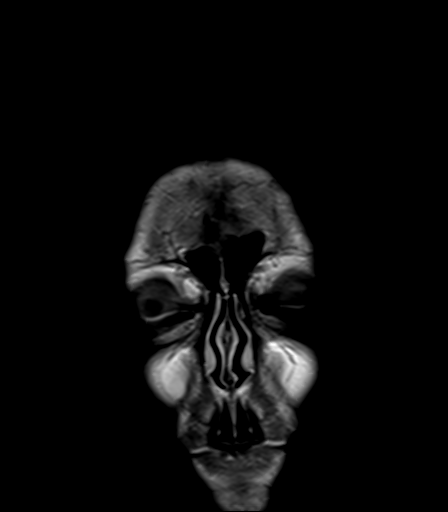

[46 of 48 positions shown; findings below may reference images not displayed]

FINDINGS: Brain: No evidence for acute infarction, hemorrhage, mass lesion,
hydrocephalus, or extra-axial fluid. Moderate cerebral and
cerebellar atrophy. Mild to moderate T2 and FLAIR hyperintensities
in the white matter, likely small vessel disease.

Thin-section imaging was obtained before, during, and after bolus
infusion of contrast. Posterior pituitary gland is preserved. The
anterior pituitary is mildly atrophic. The stalk is midline. The
gland demonstrates homogeneous enhancement, including the stalk. No
areas of differential enhancement are seen. Normal cavernous sinuses
and suprasellar cistern.

Post infusion imaging through the entire head demonstrates no
abnormal enhancement of the brain or meninges.

Vascular: Flow voids are maintained throughout the carotid, basilar,
and vertebral arteries. Moderate dolichoectasia consistent with
advanced age. The cavernous internal carotid arteries in the
parasellar region have a normal appearance.

Skull and upper cervical spine: Unremarkable clivus, midline
intracranial structures, and cerebellar tonsils.

Sinuses/Orbits: Negative.

Other: None.
IMPRESSION: The pituitary appears mildly atrophic, but there are no features
suggestive of microadenoma, macroadenoma, or parasellar mass.

Moderate atrophy and small vessel disease. No hypothalamic
abnormality.

No acute or focal intracranial findings. Normal postcontrast
appearance of the brain.

## 2017-11-05 DIAGNOSIS — H35373 Puckering of macula, bilateral: Secondary | ICD-10-CM | POA: Diagnosis not present

## 2017-11-05 DIAGNOSIS — H16223 Keratoconjunctivitis sicca, not specified as Sjogren's, bilateral: Secondary | ICD-10-CM | POA: Diagnosis not present

## 2017-11-15 ENCOUNTER — Other Ambulatory Visit: Payer: Self-pay | Admitting: Internal Medicine

## 2017-11-23 ENCOUNTER — Encounter: Payer: Self-pay | Admitting: Internal Medicine

## 2017-11-23 ENCOUNTER — Ambulatory Visit (INDEPENDENT_AMBULATORY_CARE_PROVIDER_SITE_OTHER): Payer: Medicare HMO | Admitting: Internal Medicine

## 2017-11-23 VITALS — BP 114/72 | HR 65 | Temp 98.0°F | Ht 61.0 in | Wt 128.0 lb

## 2017-11-23 DIAGNOSIS — R270 Ataxia, unspecified: Secondary | ICD-10-CM | POA: Insufficient documentation

## 2017-11-23 DIAGNOSIS — F419 Anxiety disorder, unspecified: Secondary | ICD-10-CM | POA: Diagnosis not present

## 2017-11-23 DIAGNOSIS — E785 Hyperlipidemia, unspecified: Secondary | ICD-10-CM

## 2017-11-23 DIAGNOSIS — R69 Illness, unspecified: Secondary | ICD-10-CM | POA: Diagnosis not present

## 2017-11-23 DIAGNOSIS — G2581 Restless legs syndrome: Secondary | ICD-10-CM | POA: Diagnosis not present

## 2017-11-23 DIAGNOSIS — M797 Fibromyalgia: Secondary | ICD-10-CM

## 2017-11-23 DIAGNOSIS — G4700 Insomnia, unspecified: Secondary | ICD-10-CM | POA: Diagnosis not present

## 2017-11-23 DIAGNOSIS — R5382 Chronic fatigue, unspecified: Secondary | ICD-10-CM | POA: Diagnosis not present

## 2017-11-23 DIAGNOSIS — F329 Major depressive disorder, single episode, unspecified: Secondary | ICD-10-CM | POA: Diagnosis not present

## 2017-11-23 NOTE — Progress Notes (Signed)
Subjective:  Patient ID: Shannon Sutton, female    DOB: April 24, 1946  Age: 72 y.o. MRN: [id number]  CC: No chief complaint on file.   HPI Shannon Sutton presents for CFS, weakness, tremor, dizziness, decision making issues.  Complains of blurring in the eyes.  She saw an ophthalmologist 3 times.  Complains of bad headaches.  The tremors are mostly in the left hand; they fluctuate.  Complains of inability of the right legibly and having a trouble maneuvering fingers of the right hand especially.  Feeling exhausted most of the time however feeling fidgety when sitting or lying down.  Neck is usually stiff and sore.  The sense of smell is absent for many years.  Complaining of being depressed. Complains of a "sort of confusion" sometimes over small things (she had a "buy 2 and get one free [employer] incident" when she could not figure out for several minutes how it works and how many [employer] she should get.). Problems writing, poor balance.  She has a Neurol appt on 12/02/17  Outpatient Medications Prior to Visit  Medication Sig Dispense Refill  . aspirin EC 81 MG tablet Take 81 mg by mouth daily.    . B Complex-C (SUPER B COMPLEX) TABS Take 1 tablet by mouth daily.     . Calcium Citrate-Vitamin D (EQ CALCIUM CITRATE+D3 PO)     . Cholecalciferol (VITAMIN D3) 2000 UNITS TABS Take 1 tablet by mouth daily.      . clonazePAM (KLONOPIN) 0.5 MG tablet Take 1-2 tablets (0.5-1 mg total) by mouth at bedtime as needed. 60 tablet 2  . Coenzyme Q10 (COQ-10) 100 MG CAPS     . cyclobenzaprine (FLEXERIL) 5 MG tablet Take 1 tablet (5 mg total) by mouth 2 (two) times daily as needed for muscle spasms. 30 tablet 3  . DHEA 50 MG TABS     . folic acid (FOLVITE) 800 MCG tablet Take 400 mcg by mouth daily.    Marland Kitchen LINZESS 290 MCG CAPS capsule TAKE 1 CAPSULE BY MOUTH ONCE DAILY 90 capsule 3  . LOTEMAX 0.5 % GEL as needed.    . magnesium 30 MG tablet Take 30 mg by mouth 2 (two) times daily.    . Multiple  Vitamins-Minerals (MULTIVITAMIN WITH MINERALS) tablet Take 1 tablet by mouth daily.      Marland Kitchen OVER THE COUNTER MEDICATION Take 1 tablet by mouth 2 (two) times daily. BAYER BACK AND BODY WITH CAFFEINE1000-650    . rOPINIRole (REQUIP) 0.25 MG tablet Take 1-2 tablets (0.25-0.5 mg total) by mouth at bedtime. 60 tablet 5  . rosuvastatin (CRESTOR) 5 MG tablet Take 1 tablet (5 mg total) by mouth daily. 30 tablet 11  . traZODone (DESYREL) 50 MG tablet TAKE ONE TABLET BY MOUTH ONCE DAILY AT BEDTIME 90 tablet 3  . TURMERIC CURCUMIN PO     . UNABLE TO FIND     . venlafaxine XR (EFFEXOR XR) 75 MG 24 hr capsule Take 1 capsule (75 mg total) by mouth daily with breakfast. 30 capsule 5  . vitamin C (ASCORBIC ACID) 500 MG tablet      Facility-Administered Medications Prior to Visit  Medication Dose Route Frequency Provider Last Rate Last Dose  . 0.9 %  sodium chloride infusion  500 mL Intravenous Continuous Danis, Starr Lake III, MD        ROS Review of Systems  Constitutional: Positive for fatigue. Negative for activity change, appetite change, chills and unexpected weight change.  HENT:  Negative for congestion, mouth sores and sinus pressure.   Eyes: Negative for visual disturbance.  Respiratory: Negative for cough and chest tightness.   Gastrointestinal: Negative for abdominal pain and nausea.  Genitourinary: Negative for difficulty urinating, frequency and vaginal pain.  Musculoskeletal: Positive for arthralgias and back pain. Negative for gait problem.  Skin: Negative for pallor and rash.  Neurological: Positive for dizziness, weakness, light-headedness, numbness and headaches. Negative for tremors.  Psychiatric/Behavioral: Positive for decreased concentration and dysphoric mood. Negative for confusion, sleep disturbance and suicidal ideas. The patient is nervous/anxious.     Objective:  BP 114/72 (BP Location: Left Arm, Patient Position: Sitting, Cuff Size: Normal)   Pulse 65   Temp 98 F (36.7 C)  (Oral)   Ht 5\' 1"  (1.549 m)   Wt 128 lb (58.1 kg)   SpO2 100%   BMI 24.19 kg/m   BP Readings from Last 3 Encounters:  11/23/17 114/72  08/17/17 112/72  06/19/17 108/70    Wt Readings from Last 3 Encounters:  11/23/17 128 lb (58.1 kg)  08/17/17 126 lb (57.2 kg)  06/19/17 125 lb (56.7 kg)    Physical Exam  Constitutional: She appears well-developed. No distress.  HENT:  Head: Normocephalic.  Right Ear: External ear normal.  Left Ear: External ear normal.  Nose: Nose normal.  Mouth/Throat: Oropharynx is clear and moist.  Eyes: Conjunctivae are normal. Pupils are equal, round, and reactive to light. Right eye exhibits no discharge. Left eye exhibits no discharge.  Neck: Normal range of motion. Neck supple. No JVD present. No tracheal deviation present. No thyromegaly present.  Cardiovascular: Normal rate, regular rhythm and normal heart sounds.  Pulmonary/Chest: No stridor. No respiratory distress. She has no wheezes.  Abdominal: Soft. Bowel sounds are normal. She exhibits no distension and no mass. There is no tenderness. There is no rebound and no guarding.  Musculoskeletal: She exhibits no edema or tenderness.  Lymphadenopathy:    She has no cervical adenopathy.  Neurological: She displays normal reflexes. No cranial nerve deficit. She exhibits normal muscle tone. Coordination abnormal.  Skin: No rash noted. No erythema.  Psychiatric: Her behavior is normal. Judgment and thought content normal.  somewhat ataxic No tremor Alert, oriented, cooperative    Lab Results  Component Value Date   WBC 6.6 01/20/2017   HGB 13.8 01/20/2017   HCT 41.3 01/20/2017   PLT 210.0 01/20/2017   GLUCOSE 85 08/17/2017   CHOL 174 08/17/2017   TRIG 62.0 08/17/2017   HDL 75.20 08/17/2017   LDLCALC 87 08/17/2017   ALT 14 01/20/2017   AST 15 01/20/2017   NA 139 08/17/2017   K 4.1 08/17/2017   CL 101 08/17/2017   CREATININE 0.65 08/17/2017   BUN 13 08/17/2017   CO2 28 08/17/2017    TSH 1.80 01/20/2017    Mm Screening Breast Tomo Bilateral  Result Date: 08/05/2017 CLINICAL DATA:  Screening. EXAM: 2D DIGITAL SCREENING BILATERAL MAMMOGRAM WITH CAD AND ADJUNCT TOMO COMPARISON:  Previous exam(s). ACR Breast Density Category b: There are scattered areas of fibroglandular density. FINDINGS: There are no findings suspicious for malignancy. Images were processed with CAD. IMPRESSION: No mammographic evidence of malignancy. A result letter of this screening mammogram will be mailed directly to the patient. RECOMMENDATION: Screening mammogram in one year. (Code:SM-B-01Y) BI-RADS CATEGORY  1: Negative. Electronically Signed   By: Amie Portland M.D.   On: 08/05/2017 14:28    Assessment & Plan:   There are no diagnoses linked to this encounter. I  am having Trudee Grip maintain her Vitamin D3, SUPER B COMPLEX, multivitamin with minerals, folic acid, OVER THE COUNTER MEDICATION, aspirin EC, traZODone, LOTEMAX, magnesium, CoQ-10, TURMERIC CURCUMIN PO, cyclobenzaprine, rosuvastatin, venlafaxine XR, rOPINIRole, clonazePAM, LINZESS, DHEA, vitamin C, UNABLE TO FIND, and Calcium Citrate-Vitamin D (EQ CALCIUM CITRATE+D3 PO). We will continue to administer sodium chloride.  No orders of the defined types were placed in this encounter.    Follow-up: No Follow-up on file.  Sonda Primes, MD

## 2017-11-23 NOTE — Assessment & Plan Note (Signed)
Shannon Sutton has an appointment with Dr. Leta Baptist for February 20th.  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  It is possible that she has an evolving neurologic illness of unclear etiology at this point.  Her chronic fatigue could be a part of it.  Her brain MRI was fairly unremarkable 8 months ago.

## 2017-11-23 NOTE — Assessment & Plan Note (Signed)
Effexor, Clonazepam prn

## 2017-11-23 NOTE — Assessment & Plan Note (Addendum)
?  etiology. R/o Parkinson, MS etc Neurol ref is pending on 2/19 Brain MRI 2018 On Requip for RLS

## 2017-11-23 NOTE — Assessment & Plan Note (Addendum)
Cont w/yoga, flexeril prn Shannon Sutton has an appointment with Dr. Leta Baptist for February 20th.  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  It is possible that she has an evolving neurologic illness of unclear etiology at this point.  Her chronic fatigue/FMS could be a part of it.  Her brain MRI was fairly unremarkable 8 months ago.

## 2017-11-23 NOTE — Assessment & Plan Note (Signed)
On Klonopin Trazodone Requip

## 2017-11-23 NOTE — Assessment & Plan Note (Signed)
Trazodone, Requip

## 2017-12-02 ENCOUNTER — Ambulatory Visit: Payer: Medicare HMO | Admitting: Diagnostic Neuroimaging

## 2017-12-02 ENCOUNTER — Encounter: Payer: Self-pay | Admitting: Diagnostic Neuroimaging

## 2017-12-02 VITALS — BP 112/66 | HR 82 | Ht 61.0 in | Wt 126.0 lb

## 2017-12-02 DIAGNOSIS — R251 Tremor, unspecified: Secondary | ICD-10-CM

## 2017-12-02 DIAGNOSIS — R269 Unspecified abnormalities of gait and mobility: Secondary | ICD-10-CM | POA: Diagnosis not present

## 2017-12-02 DIAGNOSIS — R531 Weakness: Secondary | ICD-10-CM | POA: Diagnosis not present

## 2017-12-02 NOTE — Patient Instructions (Signed)
-   consider physical therapy for gait and balance training  - optimize nutrition, depression and anxiety treatment  - monitor tremor issues

## 2017-12-02 NOTE — Progress Notes (Signed)
[facility]  PATIENT: Shannon Sutton DOB: 31-Jan-1946  REFERRING CLINICIAN: Plotnikov, A HISTORY FROM: patient  REASON FOR VISIT: new consult    HISTORICAL  CHIEF COMPLAINT:  Chief Complaint  Patient presents with  . Muscle weakness    rm 7, New Pt, "multiple c/o", unsure if it has to do with past medications I took"     HISTORY OF PRESENT ILLNESS:   72 year old right-handed female here for evaluation of tremor, weakness, blurred vision, headaches, fatigue.  Evaluate for Parkinson symptoms.  Patient reports intermittent weakness and shaky sensation with heart racing feeling since February 2018.  Patient also having some intermittent tremor, mainly with certain postures and actions, mainly in her left hand, dating back to January 2017.  She had evaluation with movement disorder specialist Dr. Arbutus Leas late patient also reportsr in 2017, who felt that patient did not have Parkinson's disease at that time.  Long history of loss of smell.  Patient feels like her handwriting is worsening.  Patient also concerned about her family history of Parkinson's disease in her paternal grandfather and maternal aunt.  Patient also having issues with depression, confusion, memory loss, balance problems.  Patient has intermittent headaches with pressure sensation.  No nausea or vomiting.  No photophobia or phonophobia.  Patient has been struggling with her vision and has gone through "$2000 worth" of different glasses and eye exams without benefit in her vision.    REVIEW OF SYSTEMS: Full 14 system review of systems performed and negative with exception of: Fatigue blurred vision easy bruising confusion headache weakness dizziness tremor restless legs depression anxiety decreased energy constipation aching muscles spinning sensation palpitations fatigue.  ALLERGIES: Allergies  Allergen Reactions  . Crestor [Rosuvastatin Calcium]     abd pain  . Cymbalta [Duloxetine Hcl]    Nausea   . Morphine And Related Itching    agitation  . Pravastatin     abd pain  . Promethazine-Codeine Itching    Itching in face    HOME MEDICATIONS: Outpatient Medications Prior to Visit  Medication Sig Dispense Refill  . aspirin EC 81 MG tablet Take 81 mg by mouth daily.    . B Complex-C (SUPER B COMPLEX) TABS Take 1 tablet by mouth daily.     . Calcium Citrate-Vitamin D (EQ CALCIUM CITRATE+D3 PO)     . clonazePAM (KLONOPIN) 0.5 MG tablet Take 1-2 tablets (0.5-1 mg total) by mouth at bedtime as needed. 60 tablet 2  . Coenzyme Q10 (COQ-10) 100 MG CAPS     . DHEA 50 MG TABS     . folic acid (FOLVITE) 800 MCG tablet Take 400 mcg by mouth daily.    Marland Kitchen LINZESS 290 MCG CAPS capsule TAKE 1 CAPSULE BY MOUTH ONCE DAILY 90 capsule 3  . magnesium 30 MG tablet Take 30 mg by mouth 2 (two) times daily.    . Melatonin 5 MG CAPS Take by mouth.    . Multiple Vitamins-Minerals (MULTIVITAMIN WITH MINERALS) tablet Take 1 tablet by mouth daily.      . Potassium Gluconate 550 MG TABS Take by mouth.    Marland Kitchen rOPINIRole (REQUIP) 0.25 MG tablet Take 1-2 tablets (0.25-0.5 mg total) by mouth at bedtime. 60 tablet 5  . rosuvastatin (CRESTOR) 5 MG tablet Take 1 tablet (5 mg total) by mouth daily. 30 tablet 11  . traZODone (DESYREL) 50 MG tablet TAKE ONE TABLET BY MOUTH ONCE DAILY AT BEDTIME 90 tablet 3  . TURMERIC CURCUMIN PO     .  UNABLE TO FIND     . venlafaxine XR (EFFEXOR XR) 75 MG 24 hr capsule Take 1 capsule (75 mg total) by mouth daily with breakfast. 30 capsule 5  . vitamin C (ASCORBIC ACID) 500 MG tablet     . cyclobenzaprine (FLEXERIL) 5 MG tablet Take 1 tablet (5 mg total) by mouth 2 (two) times daily as needed for muscle spasms. (Patient not taking: Reported on 12/02/2017) 30 tablet 3  . OVER THE COUNTER MEDICATION Take 1 tablet by mouth 2 (two) times daily. BAYER BACK AND BODY WITH CAFFEINE1000-650    . Cholecalciferol (VITAMIN D3) 2000 UNITS TABS Take 1 tablet by mouth daily.      Marland Kitchen LOTEMAX 0.5 %  GEL as needed.     Facility-Administered Medications Prior to Visit  Medication Dose Route Frequency Provider Last Rate Last Dose  . 0.9 %  sodium chloride infusion  500 mL Intravenous Continuous Sherrilyn Rist, MD        PAST MEDICAL HISTORY: Past Medical History:  Diagnosis Date  . Adenomatous colon polyp 1990  . Allergy   . Anxiety 12/15/2013  . Arthritis   . Cataract   . Chronic constipation   . Depression 11/16/2012  . Diverticulitis   . Diverticulosis   . 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 External hemorrhoids   . Fibromyalgia with myofascial pain 11/16/2012  . Headaches, cluster   . Hypercholesterolemia 12/15/2013  . Hypertension   . Insomnia 11/16/2012  . Myalgia and myositis, unspecified   . Other disorder of muscle, ligament, and fascia   . Other specified disease of hair and hair follicles   . Pain in joint, multiple sites   . Restless leg syndrome 12/15/2013  . Unspecified sleep apnea   . Urine incontinence     PAST SURGICAL HISTORY: Past Surgical History:  Procedure Laterality Date  . ABDOMINOPLASTY  2006   "tummy tuck"  . BREAST REDUCTION SURGERY  1995  . BREAST SURGERY  1981   left breast biopsy  . CATARACT EXTRACTION, BILATERAL    . MUSCLE BIOPSY  2009   DEEP TISSUE  . REDUCTION MAMMAPLASTY    . REFRACTIVE SURGERY    . TONSILLECTOMY    . TOTAL ABDOMINAL HYSTERECTOMY  1999  . WISDOM TOOTH EXTRACTION      FAMILY HISTORY: Family History  Problem Relation Age of Onset  . Colon cancer Father 40  . Heart disease Father   . Colon polyps Mother   . Heart disease Mother   . COPD Sister   . Emphysema Sister   . COPD Brother   . Emphysema Brother   . Lung cancer Brother   . Breast cancer Paternal Grandmother   . Heart disease Other        family history  . Parkinson's disease Other   . Heart disease Paternal Grandfather   . Colon polyps Paternal Grandfather   . Stomach cancer Neg Hx   . Rectal cancer Neg Hx   . Esophageal cancer Neg Hx      SOCIAL HISTORY:  Social History   Socioeconomic History  . Marital status: Divorced    Spouse name: Not on file  . Number of children: 2  . Years of education: Not on file  . Highest education level: Not on file  Social Needs  . Financial resource strain: Not on file  . Food insecurity - worry: Not on file  . Food insecurity - inability: Not on file  . Transportation needs -  medical: Not on file  . Transportation needs - non-medical: Not on file  Occupational History  . Occupation: Retired    Associate Professor: RETIRED    CommentEquities trader  Tobacco Use  . Smoking status: Former Smoker    Last attempt to quit: 12/19/1986    Years since quitting: 30.9  . Smokeless tobacco: Never Used  Substance and Sexual Activity  . Alcohol use: Yes    Alcohol/week: 0.6 oz    Types: 1 Glasses of wine per week    Comment: ONCE OR TWICE A YEAR WILL HAVE WINE  . Drug use: No  . Sexual activity: Yes    Birth control/protection: Surgical, Post-menopausal  Other Topics Concern  . Not on file  Social History Narrative   Lives alone   Education- 2 years of college   2 daughters     PHYSICAL EXAM  GENERAL EXAM/CONSTITUTIONAL: Vitals:  Vitals:   12/02/17 0951  BP: 112/66  Pulse: 82  Weight: 126 lb (57.2 kg)  Height: 5\' 1"  (1.549 m)     Body mass index is 23.81 kg/m.  No exam data present  Patient is in no distress; well developed, nourished and groomed; neck is supple  CARDIOVASCULAR:  Examination of carotid arteries is normal; no carotid bruits  Regular rate and rhythm, no murmurs  Examination of peripheral vascular system by observation and palpation is normal  EYES:  Ophthalmoscopic exam of optic discs and posterior segments is normal; no papilledema or hemorrhages  MUSCULOSKELETAL:  Gait, strength, tone, movements noted in Neurologic exam below  NEUROLOGIC: MENTAL STATUS:  No flowsheet data found.  awake, alert, oriented to person, place and  time  recent and remote memory intact  normal attention and concentration  language fluent, comprehension intact, naming intact,   fund of knowledge appropriate  CRANIAL NERVE:   2nd - no papilledema on fundoscopic exam  2nd, 3rd, 4th, 6th - pupils equal and reactive to light, visual fields full to confrontation, extraocular muscles intact, no nystagmus  5th - facial sensation symmetric  7th - facial strength symmetric  8th - hearing intact  9th - palate elevates symmetrically, uvula midline  11th - shoulder shrug symmetric  12th - tongue protrusion midline  MOTOR:   normal bulk and tone, full strength in the BUE, BLE  MILD BRADYKINESIA IN LUE AND LLE  NO TREMOR  SENSORY:   normal and symmetric to light touch, temperature, vibration  COORDINATION:   finger-nose-finger, fine finger movements normal  REFLEXES:   deep tendon reflexes present and symmetric  GAIT/STATION:   narrow based gait; SLIGHTLY UNSTEADY    DIAGNOSTIC DATA (LABS, IMAGING, TESTING) - I reviewed patient records, labs, notes, testing and imaging myself where available.  Lab Results  Component Value Date   WBC 6.6 01/20/2017   HGB 13.8 01/20/2017   HCT 41.3 01/20/2017   MCV 92.8 01/20/2017   PLT 210.0 01/20/2017      Component Value Date/Time   NA 139 08/17/2017 0956   NA 140 09/19/2013   K 4.1 08/17/2017 0956   CL 101 08/17/2017 0956   CO2 28 08/17/2017 0956   GLUCOSE 85 08/17/2017 0956   BUN 13 08/17/2017 0956   BUN 11 09/19/2013   CREATININE 0.65 08/17/2017 0956   CALCIUM 9.7 08/17/2017 0956   PROT 7.1 01/20/2017 1207   ALBUMIN 4.4 01/20/2017 1207   AST 15 01/20/2017 1207   ALT 14 01/20/2017 1207   ALKPHOS 47 01/20/2017 1207   BILITOT 0.4 01/20/2017  1207   Lab Results  Component Value Date   CHOL 174 08/17/2017   HDL 75.20 08/17/2017   LDLCALC 87 08/17/2017   TRIG 62.0 08/17/2017   CHOLHDL 2 08/17/2017   No results found for: HGBA1C Lab Results  Component  Value Date   VITAMINB12 837 05/29/2016   Lab Results  Component Value Date   TSH 1.80 01/20/2017    07/11/13 MRI lumbar spine [I reviewed images myself and agree with interpretation. -VRP]  1. Grade 1 anterolisthesis at L4-5 measures 5 mm. 2. And mild left foraminal narrowing at L2-3. 3. Mild right lateral recess narrowing at L3-4. 4. Moderate right and mild left foraminal stenosis at L3-4. 5. Mild right lateral recess and foraminal stenosis at L4-5 secondary to a rightward disc herniation and facet hypertrophy. 6. Chronic loss of disc height at L5-S1 without significant stenosis.  04/08/17 MRI brain [I reviewed images myself and agree with interpretation. -VRP]  - The pituitary appears mildly atrophic, but there are no features suggestive of microadenoma, macroadenoma, or parasellar mass. - Moderate atrophy and small vessel disease. No hypothalamic abnormality. - No acute or focal intracranial findings. Normal postcontrast appearance of the brain.     ASSESSMENT AND PLAN  72 y.o. year old female here with constellation of symptoms including fatigue, tremor, anxiety, headaches, vision changes since 2017.  Patient has some bradykinesia on neurologic exam but otherwise unremarkable.  No definite Parkinson's disease at this time.  We will continue to monitor his symptoms.   Dx:  1. Tremor   2. Generalized weakness   3. Gait difficulty      PLAN:  - consider physical therapy for gait and balance training  - optimize nutrition, depression and anxiety treatment  - monitor tremor issues; no definite parkinson's disease at this time  Return in about 6 months (around 06/01/2018).    Suanne Marker, MD 12/02/2017, 10:07 AM Certified in Neurology, Neurophysiology and Neuroimaging  [facility] [address] [place], [place] [address] [phone]

## 2018-01-13 DIAGNOSIS — H16223 Keratoconjunctivitis sicca, not specified as Sjogren's, bilateral: Secondary | ICD-10-CM | POA: Diagnosis not present

## 2018-01-13 DIAGNOSIS — H40013 Open angle with borderline findings, low risk, bilateral: Secondary | ICD-10-CM | POA: Diagnosis not present

## 2018-01-15 ENCOUNTER — Encounter: Payer: Self-pay | Admitting: Internal Medicine

## 2018-01-20 ENCOUNTER — Other Ambulatory Visit: Payer: Self-pay | Admitting: Internal Medicine

## 2018-01-20 DIAGNOSIS — R270 Ataxia, unspecified: Secondary | ICD-10-CM

## 2018-01-28 ENCOUNTER — Ambulatory Visit: Payer: Medicare HMO | Admitting: Physical Therapy

## 2018-02-01 ENCOUNTER — Encounter: Payer: Self-pay | Admitting: Physical Therapy

## 2018-02-01 ENCOUNTER — Ambulatory Visit: Payer: Medicare HMO | Attending: Internal Medicine | Admitting: Physical Therapy

## 2018-02-01 DIAGNOSIS — R42 Dizziness and giddiness: Secondary | ICD-10-CM

## 2018-02-01 DIAGNOSIS — R2681 Unsteadiness on feet: Secondary | ICD-10-CM | POA: Insufficient documentation

## 2018-02-01 NOTE — Therapy (Signed)
[facility] [address], [place], [place], [address] Phone: [phone]   Fax:  [phone]  Physical Therapy Evaluation  Patient Details  Name: Shannon Sutton MRN: [id number] Date of Birth: 07-Dec-1945 Referring Provider: Cassandria Anger, MD   Encounter Date: 02/01/2018  PT End of Session - 02/01/18 1110    Visit Number  1    Number of Visits  12    Date for PT Re-Evaluation  03/15/18    Authorization Type  [employer] Medicare $40 copay    PT Start Time  1015    PT Stop Time  1056    PT Time Calculation (min)  41 min    Activity Tolerance  Patient tolerated treatment well    Behavior During Therapy  [facility] for tasks assessed/performed       Past Medical History:  Diagnosis Date  . Adenomatous colon polyp 1990  . Allergy   . Anxiety 12/15/2013  . Arthritis   . Cataract   . Chronic constipation   . Depression 11/16/2012  . Diverticulitis   . Diverticulosis   . 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 External hemorrhoids   . Fibromyalgia with myofascial pain 11/16/2012  . Headaches, cluster   . Hypercholesterolemia 12/15/2013  . Hypertension   . Insomnia 11/16/2012  . Myalgia and myositis, unspecified   . Other disorder of muscle, ligament, and fascia   . Other specified disease of hair and hair follicles   . Pain in joint, multiple sites   . Restless leg syndrome 12/15/2013  . Unspecified sleep apnea   . Urine incontinence     Past Surgical History:  Procedure Laterality Date  . ABDOMINOPLASTY  2006   "tummy tuck"  . BREAST REDUCTION SURGERY  1995  . BREAST SURGERY  1981   left breast biopsy  . CATARACT EXTRACTION, BILATERAL    . MUSCLE BIOPSY  2009   DEEP TISSUE  . REDUCTION MAMMAPLASTY    . REFRACTIVE SURGERY    . TONSILLECTOMY    . TOTAL ABDOMINAL HYSTERECTOMY  1999  . WISDOM TOOTH EXTRACTION      There were no vitals filed for this visit.   Subjective Assessment - 02/01/18 1016    Subjective  Pt is  a 72 y/o female who presents to OPPT with c/o decreased balance and dizziness x 1 year.  Pt has seen ENT and is seeing eye MD to follow up on symptoms of dizziness.  Pt reports occasional episodes of weakness, and followed up with neurology who suggested PT.      Limitations  Standing;Walking    Diagnostic tests  MRI brain: mild atrophy pituitary gland, small vessel disease    Patient Stated Goals  improve balance and dizziness    Currently in Pain?  Yes    Pain Score  -- would not rate    Pain Location  Generalized    Pain Descriptors / Indicators  Sore    Pain Onset  More than a month ago    Pain Frequency  Constant    Effect of Pain on Daily Activities  pain chronic in nature; will not directly address         [facility] PT Assessment - 02/01/18 1022      Assessment   Medical Diagnosis  gait abnormality, dizziness    Referring Provider  Plotnikov, Evie Lacks, MD    Onset Date/Surgical Date  -- 1 year    Next MD Visit  PRN / Neurologist:  Restless leg syndrome 12/15/2013  . Anxiety 12/15/2013  . Fibromyalgia with myofascial pain 11/16/2012  . Eosinophilic fasciitis 94/44/6190  . Insomnia 11/16/2012  . Depression 11/16/2012      Laureen Abrahams, PT, DPT 02/01/18 11:42 AM    [facility] [address], [place], [place], [address] Phone: [phone]   Fax:  [phone]  Name: Shannon Sutton MRN: [id number] Date of Birth: [birth year]  [facility] [address], [place], [place], [address] Phone: [phone]   Fax:  [phone]  Physical Therapy Evaluation  Patient Details  Name: Shannon Sutton MRN: [id number] Date of Birth: 07-Dec-1945 Referring Provider: Cassandria Anger, MD   Encounter Date: 02/01/2018  PT End of Session - 02/01/18 1110    Visit Number  1    Number of Visits  12    Date for PT Re-Evaluation  03/15/18    Authorization Type  [employer]na Medicare $40 copay    PT Start Time  1015    PT Stop Time  1056    PT Time Calculation (min)  41 min    Activity Tolerance  Patient tolerated treatment well    Behavior During Therapy  [facility] for tasks assessed/performed       Past Medical History:  Diagnosis Date  . Adenomatous colon polyp 1990  . Allergy   . Anxiety 12/15/2013  . Arthritis   . Cataract   . Chronic constipation   . Depression 11/16/2012  . Diverticulitis   . Diverticulosis   . 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 External hemorrhoids   . Fibromyalgia with myofascial pain 11/16/2012  . Headaches, cluster   . Hypercholesterolemia 12/15/2013  . Hypertension   . Insomnia 11/16/2012  . Myalgia and myositis, unspecified   . Other disorder of muscle, ligament, and fascia   . Other specified disease of hair and hair follicles   . Pain in joint, multiple sites   . Restless leg syndrome 12/15/2013  . Unspecified sleep apnea   . Urine incontinence     Past Surgical History:  Procedure Laterality Date  . ABDOMINOPLASTY  2006   "tummy tuck"  . BREAST REDUCTION SURGERY  1995  . BREAST SURGERY  1981   left breast biopsy  . CATARACT EXTRACTION, BILATERAL    . MUSCLE BIOPSY  2009   DEEP TISSUE  . REDUCTION MAMMAPLASTY    . REFRACTIVE SURGERY    . TONSILLECTOMY    . TOTAL ABDOMINAL HYSTERECTOMY  1999  . WISDOM TOOTH EXTRACTION      There were no vitals filed for this visit.   Subjective Assessment - 02/01/18 1016    Subjective  Pt is  a 72 y/o female who presents to OPPT with c/o decreased balance and dizziness x 1 year.  Pt has seen ENT and is seeing eye MD to follow up on symptoms of dizziness.  Pt reports occasional episodes of weakness, and followed up with neurology who suggested PT.      Limitations  Standing;Walking    Diagnostic tests  MRI brain: mild atrophy pituitary gland, small vessel disease    Patient Stated Goals  improve balance and dizziness    Currently in Pain?  Yes    Pain Score  -- would not rate    Pain Location  Generalized    Pain Descriptors / Indicators  Sore    Pain Onset  More than a month ago    Pain Frequency  Constant    Effect of Pain on Daily Activities  pain chronic in nature; will not directly address         [facility] PT Assessment - 02/01/18 1022      Assessment   Medical Diagnosis  gait abnormality, dizziness    Referring Provider  Plotnikov, Evie Lacks, MD    Onset Date/Surgical Date  -- 1 year    Next MD Visit  PRN / Neurologist:  [facility] [address], [place], [place], [address] Phone: [phone]   Fax:  [phone]  Physical Therapy Evaluation  Patient Details  Name: Shannon Sutton MRN: [id number] Date of Birth: 07-Dec-1945 Referring Provider: Cassandria Anger, MD   Encounter Date: 02/01/2018  PT End of Session - 02/01/18 1110    Visit Number  1    Number of Visits  12    Date for PT Re-Evaluation  03/15/18    Authorization Type  [employer]na Medicare $40 copay    PT Start Time  1015    PT Stop Time  1056    PT Time Calculation (min)  41 min    Activity Tolerance  Patient tolerated treatment well    Behavior During Therapy  [facility] for tasks assessed/performed       Past Medical History:  Diagnosis Date  . Adenomatous colon polyp 1990  . Allergy   . Anxiety 12/15/2013  . Arthritis   . Cataract   . Chronic constipation   . Depression 11/16/2012  . Diverticulitis   . Diverticulosis   . 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 External hemorrhoids   . Fibromyalgia with myofascial pain 11/16/2012  . Headaches, cluster   . Hypercholesterolemia 12/15/2013  . Hypertension   . Insomnia 11/16/2012  . Myalgia and myositis, unspecified   . Other disorder of muscle, ligament, and fascia   . Other specified disease of hair and hair follicles   . Pain in joint, multiple sites   . Restless leg syndrome 12/15/2013  . Unspecified sleep apnea   . Urine incontinence     Past Surgical History:  Procedure Laterality Date  . ABDOMINOPLASTY  2006   "tummy tuck"  . BREAST REDUCTION SURGERY  1995  . BREAST SURGERY  1981   left breast biopsy  . CATARACT EXTRACTION, BILATERAL    . MUSCLE BIOPSY  2009   DEEP TISSUE  . REDUCTION MAMMAPLASTY    . REFRACTIVE SURGERY    . TONSILLECTOMY    . TOTAL ABDOMINAL HYSTERECTOMY  1999  . WISDOM TOOTH EXTRACTION      There were no vitals filed for this visit.   Subjective Assessment - 02/01/18 1016    Subjective  Pt is  a 72 y/o female who presents to OPPT with c/o decreased balance and dizziness x 1 year.  Pt has seen ENT and is seeing eye MD to follow up on symptoms of dizziness.  Pt reports occasional episodes of weakness, and followed up with neurology who suggested PT.      Limitations  Standing;Walking    Diagnostic tests  MRI brain: mild atrophy pituitary gland, small vessel disease    Patient Stated Goals  improve balance and dizziness    Currently in Pain?  Yes    Pain Score  -- would not rate    Pain Location  Generalized    Pain Descriptors / Indicators  Sore    Pain Onset  More than a month ago    Pain Frequency  Constant    Effect of Pain on Daily Activities  pain chronic in nature; will not directly address         [facility] PT Assessment - 02/01/18 1022      Assessment   Medical Diagnosis  gait abnormality, dizziness    Referring Provider  Plotnikov, Evie Lacks, MD    Onset Date/Surgical Date  -- 1 year    Next MD Visit  PRN / Neurologist:

## 2018-02-03 ENCOUNTER — Encounter: Payer: Self-pay | Admitting: Internal Medicine

## 2018-02-15 DIAGNOSIS — H01024 Squamous blepharitis left upper eyelid: Secondary | ICD-10-CM | POA: Diagnosis not present

## 2018-02-15 DIAGNOSIS — H35373 Puckering of macula, bilateral: Secondary | ICD-10-CM | POA: Diagnosis not present

## 2018-02-15 DIAGNOSIS — H01022 Squamous blepharitis right lower eyelid: Secondary | ICD-10-CM | POA: Diagnosis not present

## 2018-02-15 DIAGNOSIS — Z961 Presence of intraocular lens: Secondary | ICD-10-CM | POA: Diagnosis not present

## 2018-02-15 DIAGNOSIS — H10413 Chronic giant papillary conjunctivitis, bilateral: Secondary | ICD-10-CM | POA: Diagnosis not present

## 2018-02-15 DIAGNOSIS — H01021 Squamous blepharitis right upper eyelid: Secondary | ICD-10-CM | POA: Diagnosis not present

## 2018-02-15 DIAGNOSIS — H16223 Keratoconjunctivitis sicca, not specified as Sjogren's, bilateral: Secondary | ICD-10-CM | POA: Diagnosis not present

## 2018-02-15 DIAGNOSIS — H01025 Squamous blepharitis left lower eyelid: Secondary | ICD-10-CM | POA: Diagnosis not present

## 2018-03-02 ENCOUNTER — Ambulatory Visit: Payer: Medicare HMO | Admitting: Physical Therapy

## 2018-03-03 ENCOUNTER — Ambulatory Visit: Payer: Medicare HMO | Attending: Internal Medicine

## 2018-03-03 DIAGNOSIS — R42 Dizziness and giddiness: Secondary | ICD-10-CM

## 2018-03-03 DIAGNOSIS — R2681 Unsteadiness on feet: Secondary | ICD-10-CM | POA: Diagnosis not present

## 2018-03-03 NOTE — Therapy (Addendum)
[facility] [address] [place], [place], [address] Phone: [phone]   Fax:  [phone]  Physical Therapy Treatment  Patient Details  Name: Shannon Sutton MRN: [id number] Date of Birth: 1946-09-09 Referring Provider: Cassandria Anger, MD   Encounter Date: 03/03/2018  PT End of Session - 03/03/18 1453    Visit Number  2    Date for PT Re-Evaluation  03/15/18    Authorization Type  [employer] Medicare $40 copay    PT Start Time  1405    PT Stop Time  1450    PT Time Calculation (min)  45 min    Activity Tolerance  Patient tolerated treatment well    Behavior During Therapy  [facility] for tasks assessed/performed       Past Medical History:  Diagnosis Date  . Adenomatous colon polyp 1990  . Allergy   . Anxiety 12/15/2013  . Arthritis   . Cataract   . Chronic constipation   . Depression 11/16/2012  . Diverticulitis   . Diverticulosis   . Eosinophilic fasciitis 12/16/91  . External hemorrhoids   . Fibromyalgia with myofascial pain 11/16/2012  . Headaches, cluster   . Hypercholesterolemia 12/15/2013  . Hypertension   . Insomnia 11/16/2012  . Myalgia and myositis, unspecified   . Other disorder of muscle, ligament, and fascia   . Other specified disease of hair and hair follicles   . Pain in joint, multiple sites   . Restless leg syndrome 12/15/2013  . Unspecified sleep apnea   . Urine incontinence     Past Surgical History:  Procedure Laterality Date  . ABDOMINOPLASTY  2006   "tummy tuck"  . BREAST REDUCTION SURGERY  1995  . BREAST SURGERY  1981   left breast biopsy  . CATARACT EXTRACTION, BILATERAL    . MUSCLE BIOPSY  2009   DEEP TISSUE  . REDUCTION MAMMAPLASTY    . REFRACTIVE SURGERY    . TONSILLECTOMY    . TOTAL ABDOMINAL HYSTERECTOMY  1999  . WISDOM TOOTH EXTRACTION      There were no vitals filed for this visit.  Subjective Assessment - 03/03/18 1407    Subjective  I am still very dizzy.  I was out  of town for 4 weeks and was dizzy the entire time.  I thought that I was going to pass out before I got here because I am light sensitive.      Diagnostic tests  MRI brain: mild atrophy pituitary gland, small vessel disease    Patient Stated Goals  improve balance and dizziness                       [facility] Adult PT Treatment/Exercise - 03/03/18 0001      Balance   Balance Assessed  Yes      High Level Balance   High Level Balance Activities  Tandem walking      Neuro Re-ed    Neuro Re-ed Details   standing balance exercises      Exercises   Exercises  Knee/Hip;Ankle      Knee/Hip Exercises: Aerobic   Nustep  Level 2 x 8 minutes PT present to discuss progress      Knee/Hip Exercises: Standing   Other Standing Knee Exercises  tandem line walk and sidestepping at the countertop      Ankle Exercises: Standing   SLS  3x20 seconds Rt and Lt    Rebounder  weight  [facility] [address] [place], [place], [address] Phone: [phone]   Fax:  [phone]  Physical Therapy Treatment  Patient Details  Name: Shannon Sutton MRN: [id number] Date of Birth: 1946-09-09 Referring Provider: Cassandria Anger, MD   Encounter Date: 03/03/2018  PT End of Session - 03/03/18 1453    Visit Number  2    Date for PT Re-Evaluation  03/15/18    Authorization Type  A[employer]na Medicare $40 copay    PT Start Time  1405    PT Stop Time  1450    PT Time Calculation (min)  45 min    Activity Tolerance  Patient tolerated treatment well    Behavior During Therapy  [facility] for tasks assessed/performed       Past Medical History:  Diagnosis Date  . Adenomatous colon polyp 1990  . Allergy   . Anxiety 12/15/2013  . Arthritis   . Cataract   . Chronic constipation   . Depression 11/16/2012  . Diverticulitis   . Diverticulosis   . Eosinophilic fasciitis 12/16/91  . External hemorrhoids   . Fibromyalgia with myofascial pain 11/16/2012  . Headaches, cluster   . Hypercholesterolemia 12/15/2013  . Hypertension   . Insomnia 11/16/2012  . Myalgia and myositis, unspecified   . Other disorder of muscle, ligament, and fascia   . Other specified disease of hair and hair follicles   . Pain in joint, multiple sites   . Restless leg syndrome 12/15/2013  . Unspecified sleep apnea   . Urine incontinence     Past Surgical History:  Procedure Laterality Date  . ABDOMINOPLASTY  2006   "tummy tuck"  . BREAST REDUCTION SURGERY  1995  . BREAST SURGERY  1981   left breast biopsy  . CATARACT EXTRACTION, BILATERAL    . MUSCLE BIOPSY  2009   DEEP TISSUE  . REDUCTION MAMMAPLASTY    . REFRACTIVE SURGERY    . TONSILLECTOMY    . TOTAL ABDOMINAL HYSTERECTOMY  1999  . WISDOM TOOTH EXTRACTION      There were no vitals filed for this visit.  Subjective Assessment - 03/03/18 1407    Subjective  I am still very dizzy.  I was out  of town for 4 weeks and was dizzy the entire time.  I thought that I was going to pass out before I got here because I am light sensitive.      Diagnostic tests  MRI brain: mild atrophy pituitary gland, small vessel disease    Patient Stated Goals  improve balance and dizziness                       [facility] Adult PT Treatment/Exercise - 03/03/18 0001      Balance   Balance Assessed  Yes      High Level Balance   High Level Balance Activities  Tandem walking      Neuro Re-ed    Neuro Re-ed Details   standing balance exercises      Exercises   Exercises  Knee/Hip;Ankle      Knee/Hip Exercises: Aerobic   Nustep  Level 2 x 8 minutes PT present to discuss progress      Knee/Hip Exercises: Standing   Other Standing Knee Exercises  tandem line walk and sidestepping at the countertop      Ankle Exercises: Standing   SLS  3x20 seconds Rt and Lt    Rebounder  weight  Takacs, PT 03/03/18 2:56 PM PHYSICAL THERAPY DISCHARGE SUMMARY  Visits from Start of Care: 2  Current functional level related to goals / functional outcomes: See above for most current status.  Pt didn't return to PT.     Remaining deficits: See above.    Education / Equipment: HEP Plan: Patient agrees to discharge.  Patient goals were not met. Patient is being discharged due to not returning since the last visit.  ?????        Sigurd Sos, PT 04/13/18 8:34 AM   [facility] [address] [place], [place], [address] Phone: [phone]   Fax:  [phone]  Name: Shannon Sutton MRN: [id number] Date of Birth: October 08, 1946

## 2018-03-03 NOTE — Patient Instructions (Signed)
Access Code: [email or web address]  URL: [email or web address]  Date: 03/03/2018  Prepared by: Sigurd Sos   Exercises  Tandem Stance - 3 reps - 1 sets - 30" hold - 2x daily - 7x weekly  Single Leg Stance - 3 reps - 1 sets - 30" hold - 2x daily - 7x weekly  Sidestepping On Line - 10 reps - 3 sets - 2x daily - 7x weekly  Tandem Walking Along Line - 5 reps - 1 sets - 2x daily - 7x weekly  Standing Heel Raise with Support - 10 reps - 2 sets - 2x daily - 7x weekly

## 2018-03-09 DIAGNOSIS — D229 Melanocytic nevi, unspecified: Secondary | ICD-10-CM | POA: Diagnosis not present

## 2018-03-09 DIAGNOSIS — D225 Melanocytic nevi of trunk: Secondary | ICD-10-CM | POA: Diagnosis not present

## 2018-03-17 ENCOUNTER — Ambulatory Visit (INDEPENDENT_AMBULATORY_CARE_PROVIDER_SITE_OTHER): Payer: Medicare HMO | Admitting: Internal Medicine

## 2018-03-17 ENCOUNTER — Encounter: Payer: Self-pay | Admitting: Internal Medicine

## 2018-03-17 VITALS — BP 124/80 | HR 76 | Temp 97.9°F | Ht 61.0 in | Wt 130.0 lb

## 2018-03-17 DIAGNOSIS — G4700 Insomnia, unspecified: Secondary | ICD-10-CM | POA: Diagnosis not present

## 2018-03-17 DIAGNOSIS — F329 Major depressive disorder, single episode, unspecified: Secondary | ICD-10-CM

## 2018-03-17 DIAGNOSIS — F419 Anxiety disorder, unspecified: Secondary | ICD-10-CM | POA: Diagnosis not present

## 2018-03-17 DIAGNOSIS — E785 Hyperlipidemia, unspecified: Secondary | ICD-10-CM | POA: Diagnosis not present

## 2018-03-17 DIAGNOSIS — R69 Illness, unspecified: Secondary | ICD-10-CM | POA: Diagnosis not present

## 2018-03-17 MED ORDER — VILAZODONE HCL 20 MG PO TABS: 20.0000 mg | ORAL_TABLET | Freq: Every day | ORAL | 6 refills | Status: DC

## 2018-03-17 MED ORDER — ZOLPIDEM TARTRATE 5 MG PO TABS: 5.0000 mg | ORAL_TABLET | Freq: Every evening | ORAL | 3 refills | Status: DC | PRN

## 2018-03-17 NOTE — Assessment & Plan Note (Signed)
Trazodone, Requip - not helping Zolpidem at hs prn  Potential benefits of a long term benzodiazepines  use as well as potential risks  and complications were explained to the patient and were aknowledged.

## 2018-03-17 NOTE — Progress Notes (Signed)
Subjective:  Patient ID: Shannon Sutton, female    DOB: 03-03-1946  Age: 72 y.o. MRN: [id number]  CC: No chief complaint on file.   HPI Shannon Sutton presents for depression, CFS, FMS f/u. Pt stopped Venlafaxine due to dizziness. Taking Crestor rare... The pt saw an endocrinologist - ok   Outpatient Medications Prior to Visit  Medication Sig Dispense Refill  . aspirin EC 81 MG tablet Take 81 mg by mouth daily.    . B Complex-C (SUPER B COMPLEX) TABS Take 1 tablet by mouth daily.     . Calcium Citrate-Vitamin D (EQ CALCIUM CITRATE+D3 PO)     . clonazePAM (KLONOPIN) 0.5 MG tablet Take 1-2 tablets (0.5-1 mg total) by mouth at bedtime as needed. 60 tablet 2  . Coenzyme Q10 (COQ-10) 100 MG CAPS     . cyclobenzaprine (FLEXERIL) 5 MG tablet Take 1 tablet (5 mg total) by mouth 2 (two) times daily as needed for muscle spasms. 30 tablet 3  . DHEA 50 MG TABS     . folic acid (FOLVITE) 800 MCG tablet Take 400 mcg by mouth daily.    Marland Kitchen LINZESS 290 MCG CAPS capsule TAKE 1 CAPSULE BY MOUTH ONCE DAILY 90 capsule 3  . magnesium 30 MG tablet Take 30 mg by mouth 2 (two) times daily.    . Melatonin 5 MG CAPS Take by mouth.    . Multiple Vitamins-Minerals (MULTIVITAMIN WITH MINERALS) tablet Take 1 tablet by mouth daily.      Marland Kitchen OVER THE COUNTER MEDICATION Take 1 tablet by mouth 2 (two) times daily. BAYER BACK AND BODY WITH CAFFEINE1000-650    . rOPINIRole (REQUIP) 0.25 MG tablet Take 1-2 tablets (0.25-0.5 mg total) by mouth at bedtime. 60 tablet 5  . rosuvastatin (CRESTOR) 5 MG tablet Take 1 tablet (5 mg total) by mouth daily. 30 tablet 11  . traZODone (DESYREL) 50 MG tablet TAKE ONE TABLET BY MOUTH ONCE DAILY AT BEDTIME 90 tablet 3  . TURMERIC CURCUMIN PO     . UNABLE TO FIND     . vitamin C (ASCORBIC ACID) 500 MG tablet     . Potassium Gluconate 550 MG TABS Take by mouth.    . venlafaxine XR (EFFEXOR XR) 75 MG 24 hr capsule Take 1 capsule (75 mg total) by mouth daily with breakfast. 30 capsule 5    Facility-Administered Medications Prior to Visit  Medication Dose Route Frequency Provider Last Rate Last Dose  . 0.9 %  sodium chloride infusion  500 mL Intravenous Continuous Danis, Starr Lake III, MD        ROS: Review of Systems  Constitutional: Positive for fatigue. Negative for activity change, appetite change, chills and unexpected weight change.  HENT: Negative for congestion, mouth sores and sinus pressure.   Eyes: Negative for visual disturbance.  Respiratory: Negative for cough and chest tightness.   Gastrointestinal: Negative for abdominal pain and nausea.  Genitourinary: Negative for difficulty urinating, frequency and vaginal pain.  Musculoskeletal: Positive for arthralgias. Negative for back pain and gait problem.  Skin: Negative for pallor and rash.  Neurological: Negative for dizziness, tremors, weakness, numbness and headaches.  Psychiatric/Behavioral: Positive for decreased concentration, dysphoric mood and sleep disturbance. Negative for confusion and suicidal ideas. The patient is nervous/anxious.     Objective:  BP 124/80 (BP Location: Left Arm, Patient Position: Sitting, Cuff Size: Normal)   Pulse 76   Temp 97.9 F (36.6 C) (Oral)   Ht 5\' 1"  (1.549 m)  Wt 130 lb (59 kg)   SpO2 99%   BMI 24.56 kg/m   BP Readings from Last 3 Encounters:  03/17/18 124/80  12/02/17 112/66  11/23/17 114/72    Wt Readings from Last 3 Encounters:  03/17/18 130 lb (59 kg)  12/02/17 126 lb (57.2 kg)  11/23/17 128 lb (58.1 kg)    Physical Exam  Constitutional: She appears well-developed. No distress.  HENT:  Head: Normocephalic.  Right Ear: External ear normal.  Left Ear: External ear normal.  Nose: Nose normal.  Mouth/Throat: Oropharynx is clear and moist.  Eyes: Pupils are equal, round, and reactive to light. Conjunctivae are normal. Right eye exhibits no discharge. Left eye exhibits no discharge.  Neck: Normal range of motion. Neck supple. No JVD present. No  tracheal deviation present. No thyromegaly present.  Cardiovascular: Normal rate, regular rhythm and normal heart sounds.  Pulmonary/Chest: No stridor. No respiratory distress. She has no wheezes.  Abdominal: Soft. Bowel sounds are normal. She exhibits no distension and no mass. There is no tenderness. There is no rebound and no guarding.  Musculoskeletal: She exhibits no edema or tenderness.  Lymphadenopathy:    She has no cervical adenopathy.  Neurological: She displays normal reflexes. No cranial nerve deficit. She exhibits normal muscle tone. Coordination normal.  Skin: No rash noted. No erythema.  Psychiatric: Her behavior is normal. Judgment and thought content normal.  tearful   Lab Results  Component Value Date   WBC 6.6 01/20/2017   HGB 13.8 01/20/2017   HCT 41.3 01/20/2017   PLT 210.0 01/20/2017   GLUCOSE 85 08/17/2017   CHOL 174 08/17/2017   TRIG 62.0 08/17/2017   HDL 75.20 08/17/2017   LDLCALC 87 08/17/2017   ALT 14 01/20/2017   AST 15 01/20/2017   NA 139 08/17/2017   K 4.1 08/17/2017   CL 101 08/17/2017   CREATININE 0.65 08/17/2017   BUN 13 08/17/2017   CO2 28 08/17/2017   TSH 1.80 01/20/2017    Mm Screening Breast Tomo Bilateral  Result Date: 08/05/2017 CLINICAL DATA:  Screening. EXAM: 2D DIGITAL SCREENING BILATERAL MAMMOGRAM WITH CAD AND ADJUNCT TOMO COMPARISON:  Previous exam(s). ACR Breast Density Category b: There are scattered areas of fibroglandular density. FINDINGS: There are no findings suspicious for malignancy. Images were processed with CAD. IMPRESSION: No mammographic evidence of malignancy. A result letter of this screening mammogram will be mailed directly to the patient. RECOMMENDATION: Screening mammogram in one year. (Code:SM-B-01Y) BI-RADS CATEGORY  1: Negative. Electronically Signed   By: Amie Portland M.D.   On: 08/05/2017 14:28    Assessment & Plan:   There are no diagnoses linked to this encounter.   No orders of the defined types  were placed in this encounter.    Follow-up: No follow-ups on file.  Sonda Primes, MD

## 2018-03-17 NOTE — Assessment & Plan Note (Addendum)
Viibrid starter pack Psychiatry ref - Dr Casimiro Needle

## 2018-03-17 NOTE — Assessment & Plan Note (Signed)
Effexor, Clonazepam prn

## 2018-03-18 ENCOUNTER — Encounter: Payer: Self-pay | Admitting: Internal Medicine

## 2018-03-18 ENCOUNTER — Other Ambulatory Visit (INDEPENDENT_AMBULATORY_CARE_PROVIDER_SITE_OTHER): Payer: Medicare HMO

## 2018-03-18 DIAGNOSIS — E785 Hyperlipidemia, unspecified: Secondary | ICD-10-CM

## 2018-03-18 DIAGNOSIS — F329 Major depressive disorder, single episode, unspecified: Secondary | ICD-10-CM | POA: Diagnosis not present

## 2018-03-18 DIAGNOSIS — R69 Illness, unspecified: Secondary | ICD-10-CM | POA: Diagnosis not present

## 2018-03-18 LAB — LIPID PANEL
Cholesterol: 204 mg/dL — ABNORMAL HIGH (ref 0–200)
HDL: 69.2 mg/dL (ref >39.00)
LDL Cholesterol: 123 mg/dL — ABNORMAL HIGH (ref 0–99)
NONHDL: 134.39
Total CHOL/HDL Ratio: 3
Triglycerides: 57 mg/dL (ref 0.0–149.0)
VLDL: 11.4 mg/dL (ref 0.0–40.0)

## 2018-03-18 LAB — CBC WITH DIFFERENTIAL/PLATELET
BASOS ABS: 0.1 10*3/uL (ref 0.0–0.1)
Basophils Relative: 1.3 % (ref 0.0–3.0)
EOS PCT: 6 % — AB (ref 0.0–5.0)
Eosinophils Absolute: 0.3 10*3/uL (ref 0.0–0.7)
HEMATOCRIT: 40.1 % (ref 36.0–46.0)
Hemoglobin: 13.9 g/dL (ref 12.0–15.0)
LYMPHS PCT: 39.4 % (ref 12.0–46.0)
Lymphs Abs: 1.8 10*3/uL (ref 0.7–4.0)
MCHC: 34.6 g/dL (ref 30.0–36.0)
MCV: 91.5 fl (ref 78.0–100.0)
MONOS PCT: 9.9 % (ref 3.0–12.0)
Monocytes Absolute: 0.5 10*3/uL (ref 0.1–1.0)
NEUTROS ABS: 2 10*3/uL (ref 1.4–7.7)
Neutrophils Relative %: 43.4 % (ref 43.0–77.0)
Platelets: 213 10*3/uL (ref 150.0–400.0)
RBC: 4.38 Mil/uL (ref 3.87–5.11)
RDW: 12.6 % (ref 11.5–15.5)
WBC: 4.6 10*3/uL (ref 4.0–10.5)

## 2018-03-18 LAB — TSH: TSH: 2.85 u[IU]/mL (ref 0.35–4.50)

## 2018-03-18 LAB — BASIC METABOLIC PANEL
BUN: 13 mg/dL (ref 6–23)
CALCIUM: 9.4 mg/dL (ref 8.4–10.5)
CO2: 27 meq/L (ref 19–32)
Chloride: 106 mEq/L (ref 96–112)
Creatinine, Ser: 0.6 mg/dL (ref 0.40–1.20)
GFR: 103.82 mL/min (ref >60.00)
Glucose, Bld: 97 mg/dL (ref 70–99)
POTASSIUM: 4 meq/L (ref 3.5–5.1)
SODIUM: 142 meq/L (ref 135–145)

## 2018-03-18 LAB — HEPATIC FUNCTION PANEL
ALBUMIN: 4.3 g/dL (ref 3.5–5.2)
ALT: 11 U/L (ref 0–35)
AST: 14 U/L (ref 0–37)
Alkaline Phosphatase: 51 U/L (ref 39–117)
Bilirubin, Direct: 0.1 mg/dL (ref 0.0–0.3)
TOTAL PROTEIN: 6.9 g/dL (ref 6.0–8.3)
Total Bilirubin: 0.3 mg/dL (ref 0.2–1.2)

## 2018-03-20 DIAGNOSIS — L821 Other seborrheic keratosis: Secondary | ICD-10-CM | POA: Diagnosis not present

## 2018-03-25 ENCOUNTER — Telehealth: Payer: Self-pay

## 2018-03-25 NOTE — Telephone Encounter (Signed)
PA for Shannon Sutton has been initiated.   [place]

## 2018-03-30 ENCOUNTER — Other Ambulatory Visit: Payer: Self-pay | Admitting: Internal Medicine

## 2018-03-30 ENCOUNTER — Encounter: Payer: Self-pay | Admitting: Internal Medicine

## 2018-03-31 ENCOUNTER — Other Ambulatory Visit: Payer: Self-pay | Admitting: Internal Medicine

## 2018-03-31 ENCOUNTER — Encounter: Payer: Self-pay | Admitting: Internal Medicine

## 2018-03-31 DIAGNOSIS — E785 Hyperlipidemia, unspecified: Secondary | ICD-10-CM

## 2018-03-31 DIAGNOSIS — R42 Dizziness and giddiness: Secondary | ICD-10-CM

## 2018-03-31 NOTE — Telephone Encounter (Signed)
Check [place] registry last filled 12/24/2017.Marland KitchenJohny Sutton

## 2018-04-06 ENCOUNTER — Encounter: Payer: Self-pay | Admitting: Internal Medicine

## 2018-04-08 ENCOUNTER — Encounter: Payer: Self-pay | Admitting: Internal Medicine

## 2018-04-16 ENCOUNTER — Telehealth: Payer: Self-pay | Admitting: *Deleted

## 2018-04-16 NOTE — Telephone Encounter (Signed)
Zolpidem 5 mg PA initiated via CoverMyMeds. Key: [id number]  Ins # [id number] Pt ID: [id number]

## 2018-04-30 ENCOUNTER — Other Ambulatory Visit: Payer: Self-pay | Admitting: Internal Medicine

## 2018-05-06 ENCOUNTER — Encounter

## 2018-05-06 ENCOUNTER — Encounter (HOSPITAL_COMMUNITY): Payer: Self-pay | Admitting: Psychiatry

## 2018-05-06 ENCOUNTER — Ambulatory Visit (HOSPITAL_COMMUNITY): Payer: Medicare HMO | Admitting: Psychiatry

## 2018-05-06 VITALS — BP 116/70 | HR 88 | Ht 60.75 in | Wt 130.0 lb

## 2018-05-06 DIAGNOSIS — F339 Major depressive disorder, recurrent, unspecified: Secondary | ICD-10-CM | POA: Diagnosis not present

## 2018-05-06 DIAGNOSIS — R69 Illness, unspecified: Secondary | ICD-10-CM | POA: Diagnosis not present

## 2018-05-06 MED ORDER — FLUOXETINE HCL 20 MG PO CAPS: 20.0000 mg | ORAL_CAPSULE | Freq: Every day | ORAL | 3 refills | Status: DC

## 2018-05-06 NOTE — Progress Notes (Signed)
Psychiatric Initial Adult Assessment   Patient Identification: Shannon Sutton MRN:  [id number] Date of Evaluation:  05/06/2018 Referral Source Dr. Alain Marion Chief Complaint:   Visit Diagnosis: No diagnosis found.  History of Present Illness:  This patient is a 72 year old white divorced mother is coming for evaluation for depression. She claims that in May 2018 she's been taking Prozac 20 mg and she apparently presented to her primary care doctor describing worsening physical symptoms. It is likely interpreted these were intact emotional mental symptoms and her Prozac dose was increased to 40 mg. Apparently she did not respond to this and her Prozac was then changed to Effexor. At some point she developed dizziness and first dose thought it might be related to the Effexor. Effexor was discontinued but her dizziness continued has continued since. At one point she was placed on Vybrid that use significant side effects. The patient is a vague way describes persistent daily depression is been going on for months since the change in medications. The patient is a fair historian. She describes herself as carvedilol. She sleeps well only she takes small dose of Klonopin and trazodone. Her appetite is normal. She's having some problems thinking and concentrating but she still functions. She describes a low self-esteem. She denies a feeling of worthlessness. She does admit that she has significant energy loss. She says that for sleep morning she feels okay but as the day goes when she gets more and more fatigued. She never take a nap. Patient is able to watch TV does enjoy golf. She says she used to go to the gym and goes that she rarely does. She describes a cluster physical symptoms of feeling dizzy, restless, low energy and muscle tension in her neck. The patient denies being suicidal now or ever. She denies the use of alcohol or illicit drugs. She's never been psychotic. She denies symptoms of mania.At one point  over a decade ago she was started on Prozac but could not remember exactly why. When she started the Prozac she said she felt better after that for a number of years. The patient denies specific symptoms of generalized anxiety disorder or panic disorder. She denies is OCD. Medical illnesses includes eosinophil fasciitis. She was treated with a number of medications but along the way seem to lose connection and face with her rheumatologist. She takes nothing for this condition at this time. The patient does have hypercholesterolemia. Past psychiatric history significant for that she has never been in a psychiatric hospital never been evaluated by a psychiatrist she's never been in therapy before. She essentially has a negative psychiatric history I was than the trial a number of different antidepressants. The patient went to 2 years of college. Her parents were intact while she was growing up. She never experienced any forms of abuse. The patient is very talkative and articulate. She says a #1 problem seems to be her health issues and her emotional reaction to it.  Associated Signs/Symptoms: Depression Symptoms:  depressed mood, (Hypo) Manic Symptoms:   Anxiety Symptoms:   Psychotic Symptoms:   PTSD Symptoms:   Past Psychiatric History: Prozac, Effexor,Vybrid  Previous Psychotropic Medications: Yes   Substance Abuse History in the last 12 months:  No.  Consequences of Substance Abuse:   Past Medical History:  Past Medical History:  Diagnosis Date  . Adenomatous colon polyp 1990  . Allergy   . Anxiety 12/15/2013  . Arthritis   . Cataract   . Chronic constipation   . Depression  11/16/2012  . Diverticulitis   . Diverticulosis   . Eosinophilic fasciitis 11/17/4268  . External hemorrhoids   . Fibromyalgia with myofascial pain 11/16/2012  . Headaches, cluster   . Hypercholesterolemia 12/15/2013  . Hypertension   . Insomnia 11/16/2012  . Myalgia and myositis, unspecified   . Other disorder of  muscle, ligament, and fascia   . Other specified disease of hair and hair follicles   . Pain in joint, multiple sites   . Restless leg syndrome 12/15/2013  . Unspecified sleep apnea   . Urine incontinence     Past Surgical History:  Procedure Laterality Date  . ABDOMINOPLASTY  2006   "tummy tuck"  . BREAST REDUCTION SURGERY  1995  . BREAST SURGERY  1981   left breast biopsy  . CATARACT EXTRACTION, BILATERAL    . MUSCLE BIOPSY  2009   DEEP TISSUE  . REDUCTION MAMMAPLASTY    . REFRACTIVE SURGERY    . TONSILLECTOMY    . TOTAL ABDOMINAL HYSTERECTOMY  1999  . WISDOM TOOTH EXTRACTION      Family Psychiatric History:   Family History:  Family History  Problem Relation Age of Onset  . Colon cancer Father 38  . Heart disease Father   . Colon polyps Mother   . Heart disease Mother   . COPD Sister   . Emphysema Sister   . COPD Brother   . Emphysema Brother   . Lung cancer Brother   . Breast cancer Paternal Grandmother   . Heart disease Other        family history  . Parkinson's disease Other   . Heart disease Paternal Grandfather   . Colon polyps Paternal Grandfather   . Stomach cancer Neg Hx   . Rectal cancer Neg Hx   . Esophageal cancer Neg Hx     Social History:   Social History   Socioeconomic History  . Marital status: Divorced    Spouse name: Not on file  . Number of children: 2  . Years of education: Not on file  . Highest education level: Not on file  Occupational History  . Occupation: Retired    Fish farm manager: RETIRED    CommentAstronomer  Social Needs  . Financial resource strain: Not on file  . Food insecurity:    Worry: Not on file    Inability: Not on file  . Transportation needs:    Medical: Not on file    Non-medical: Not on file  Tobacco Use  . Smoking status: Former Smoker    Last attempt to quit: 12/19/1986    Years since quitting: 31.4  . Smokeless tobacco: Never Used  Substance and Sexual Activity  . Alcohol use: Yes     Alcohol/week: 0.6 oz    Types: 1 Glasses of wine per week    Comment: ONCE OR TWICE A YEAR WILL HAVE WINE  . Drug use: No  . Sexual activity: Yes    Birth control/protection: Surgical, Post-menopausal  Lifestyle  . Physical activity:    Days per week: Not on file    Minutes per session: Not on file  . Stress: Not on file  Relationships  . Social connections:    Talks on phone: Not on file    Gets together: Not on file    Attends religious service: Not on file    Active member of club or organization: Not on file    Attends meetings of clubs or organizations: Not on file  Relationship status: Not on file  Other Topics Concern  . Not on file  Social History Narrative   Lives alone   Education- 2 years of college   2 daughters    Additional Social History:   Allergies:   Allergies  Allergen Reactions  . Crestor [Rosuvastatin Calcium]     abd pain  . Cymbalta [Duloxetine Hcl]     Nausea   . Effexor Xr [Venlafaxine Hcl Er]     dizziness  . Morphine And Related Itching    agitation  . Pravastatin     abd pain  . Promethazine-Codeine Itching    Itching in face    Metabolic Disorder Labs: No results found for: HGBA1C, MPG No results found for: PROLACTIN Lab Results  Component Value Date   CHOL 204 (H) 03/18/2018   TRIG 57.0 03/18/2018   HDL 69.20 03/18/2018   CHOLHDL 3 03/18/2018   VLDL 11.4 03/18/2018   LDLCALC 123 (H) 03/18/2018   LDLCALC 87 08/17/2017     Current Medications: Current Outpatient Medications  Medication Sig Dispense Refill  . B Complex-C (SUPER B COMPLEX) TABS Take 1 tablet by mouth daily.     . Calcium Citrate-Vitamin D (EQ CALCIUM CITRATE+D3 PO)     . clonazePAM (KLONOPIN) 0.5 MG tablet Take 1-2 tablets (0.5-1 mg total) by mouth at bedtime as needed. 60 tablet 2  . Coenzyme Q10 (COQ-10) 100 MG CAPS     . DHEA 50 MG TABS     . folic acid (FOLVITE) 025 MCG tablet Take 400 mcg by mouth daily.    Marland Kitchen LINZESS 290 MCG CAPS capsule TAKE 1  CAPSULE BY MOUTH ONCE DAILY 90 capsule 3  . magnesium 30 MG tablet Take 30 mg by mouth 2 (two) times daily.    . Melatonin 5 MG CAPS Take by mouth.    . Multiple Vitamins-Minerals (MULTIVITAMIN WITH MINERALS) tablet Take 1 tablet by mouth daily.      Marland Kitchen OVER THE COUNTER MEDICATION Take 1 tablet by mouth 2 (two) times daily. BAYER BACK AND BODY WITH CAFFEINE1000-650    . rOPINIRole (REQUIP) 0.25 MG tablet Take 1-2 tablets (0.25-0.5 mg total) by mouth at bedtime. 60 tablet 5  . rosuvastatin (CRESTOR) 5 MG tablet Take 1 tablet (5 mg total) by mouth daily. 30 tablet 11  . traZODone (DESYREL) 50 MG tablet TAKE ONE TABLET BY MOUTH AT BEDTIME 90 tablet 3  . TURMERIC CURCUMIN PO     . UNABLE TO FIND     . Vilazodone HCl (VIIBRYD) 20 MG TABS Take 1 tablet (20 mg total) by mouth daily. 30 tablet 6  . vitamin C (ASCORBIC ACID) 500 MG tablet     . zolpidem (AMBIEN) 5 MG tablet Take 1 tablet (5 mg total) by mouth at bedtime as needed for sleep. 30 tablet 3  . aspirin EC 81 MG tablet Take 81 mg by mouth daily.    Marland Kitchen FLUoxetine (PROZAC) 20 MG capsule Take 1 capsule (20 mg total) by mouth daily. 30 capsule 3   Current Facility-Administered Medications  Medication Dose Route Frequency Provider Last Rate Last Dose  . 0.9 %  sodium chloride infusion  500 mL Intravenous Continuous Danis, Estill Cotta III, MD        Neurologic: Headache: No Seizure: No Paresthesias:No  Musculoskeletal: Strength & Muscle Tone: abnormal Gait & Station: unsteady Patient leans: N/A  Psychiatric Specialty Exam: ROS  Blood pressure 116/70, pulse 88, height 5' 0.75" (1.543 m), weight 130 lb (  59 kg), SpO2 99 %.Body mass index is 24.77 kg/m.  General Appearance: Fairly Groomed  Eye Contact:  Good  Speech:  Clear and Coherent  Volume:  Normal  Mood:  Depressed  Affect:  Appropriate  Thought Process:  Goal Directed  Orientation:  Full (Time, Place, and Person)  Thought Content:  WDL  Suicidal Thoughts:  No  Homicidal Thoughts:   No  Memory:  NA  Judgement:  Good  Insight:  Fair  Psychomotor Activity:  Normal  Concentration:    Recall:  [place] of Knowledge:Good  Language: Fair  Akathisia:  No  Handed:  Right  AIMS (if indicated):    Assets:  Desire for Improvement  ADL's:  Intact  Cognition: WNL  Sleep:      Treatment Plan Summary: At this time this patient's presentation is complex.it seems there is some significant organic medical processes. It is hard to clearly say that her somatic symptoms are purely due to a depression disorder. The patient will clearly say if we met her on May 2018 she was doing much better. We discussed the possibility of using Cymbalta. I do not believe this patient has fibromyalgia. Her mother does Klonopin and trazodone works well for her sleep. At this time I will be conservative and ask her to go back to taking Prozac 20 mg. I will see her back in about 1-2 months and the possibility of adding Wellbutrin should be considered for the benefits it might have been her concentration and her energy. This patient is not suicidal. She's actually function fairly well.   Jerral Ralph, MD 7/25/20192:20 PM

## 2018-05-19 ENCOUNTER — Encounter: Payer: Self-pay | Admitting: Internal Medicine

## 2018-05-19 ENCOUNTER — Ambulatory Visit (INDEPENDENT_AMBULATORY_CARE_PROVIDER_SITE_OTHER): Payer: Medicare HMO | Admitting: Internal Medicine

## 2018-05-19 DIAGNOSIS — I951 Orthostatic hypotension: Secondary | ICD-10-CM

## 2018-05-19 DIAGNOSIS — M797 Fibromyalgia: Secondary | ICD-10-CM | POA: Diagnosis not present

## 2018-05-19 DIAGNOSIS — R5382 Chronic fatigue, unspecified: Secondary | ICD-10-CM

## 2018-05-19 DIAGNOSIS — R42 Dizziness and giddiness: Secondary | ICD-10-CM | POA: Diagnosis not present

## 2018-05-19 MED ORDER — FLUDROCORTISONE ACETATE 0.1 MG PO TABS: 0.1000 mg | ORAL_TABLET | Freq: Every day | ORAL | 5 refills | Status: DC

## 2018-05-19 MED ORDER — CYCLOBENZAPRINE HCL 5 MG PO TABS: 5.0000 mg | ORAL_TABLET | Freq: Two times a day (BID) | ORAL | 2 refills | Status: DC | PRN

## 2018-05-19 NOTE — Progress Notes (Signed)
Subjective:  Patient ID: Shannon Sutton, female    DOB: 05-30-1946  Age: 72 y.o. MRN: [id number]  CC: No chief complaint on file.   HPI Shannon Sutton presents for weakness, dizziness, shakiness Not taking Ambien. C/o apathy C/o CFS, FMS  Outpatient Medications Prior to Visit  Medication Sig Dispense Refill  . B Complex-C (SUPER B COMPLEX) TABS Take 1 tablet by mouth daily.     . Calcium Citrate-Vitamin D (EQ CALCIUM CITRATE+D3 PO)     . clonazePAM (KLONOPIN) 0.5 MG tablet Take 1-2 tablets (0.5-1 mg total) by mouth at bedtime as needed. 60 tablet 2  . Coenzyme Q10 (COQ-10) 100 MG CAPS     . cyclobenzaprine (FLEXERIL) 5 MG tablet Take 5 mg by mouth 2 (two) times daily as needed for muscle spasms.    Marland Kitchen DHEA 50 MG TABS     . FLUoxetine (PROZAC) 20 MG capsule Take 1 capsule (20 mg total) by mouth daily. 30 capsule 3  . folic acid (FOLVITE) 800 MCG tablet Take 400 mcg by mouth daily.    Marland Kitchen LINZESS 290 MCG CAPS capsule TAKE 1 CAPSULE BY MOUTH ONCE DAILY 90 capsule 3  . magnesium 30 MG tablet Take 30 mg by mouth 2 (two) times daily.    . Melatonin 5 MG CAPS Take by mouth.    . Multiple Vitamins-Minerals (MULTIVITAMIN WITH MINERALS) tablet Take 1 tablet by mouth daily.      Marland Kitchen rOPINIRole (REQUIP) 0.25 MG tablet Take 1-2 tablets (0.25-0.5 mg total) by mouth at bedtime. 60 tablet 5  . rosuvastatin (CRESTOR) 5 MG tablet Take 1 tablet (5 mg total) by mouth daily. 30 tablet 11  . traZODone (DESYREL) 50 MG tablet TAKE ONE TABLET BY MOUTH AT BEDTIME 90 tablet 3  . TURMERIC CURCUMIN PO     . vitamin C (ASCORBIC ACID) 500 MG tablet     . zolpidem (AMBIEN) 5 MG tablet Take 1 tablet (5 mg total) by mouth at bedtime as needed for sleep. 30 tablet 3  . aspirin EC 81 MG tablet Take 81 mg by mouth daily.    Marland Kitchen OVER THE COUNTER MEDICATION Take 1 tablet by mouth 2 (two) times daily. BAYER BACK AND BODY WITH CAFFEINE1000-650    . UNABLE TO FIND     . Vilazodone HCl (VIIBRYD) 20 MG TABS Take 1 tablet (20 mg  total) by mouth daily. 30 tablet 6   Facility-Administered Medications Prior to Visit  Medication Dose Route Frequency Provider Last Rate Last Dose  . 0.9 %  sodium chloride infusion  500 mL Intravenous Continuous Danis, Starr Lake III, MD        ROS: Review of Systems  Constitutional: Positive for fatigue. Negative for activity change, appetite change, chills and unexpected weight change.  HENT: Negative for congestion, mouth sores and sinus pressure.   Eyes: Negative for visual disturbance.  Respiratory: Negative for cough and chest tightness.   Gastrointestinal: Negative for abdominal pain and nausea.  Genitourinary: Negative for difficulty urinating, frequency and vaginal pain.  Musculoskeletal: Positive for arthralgias and myalgias. Negative for back pain and gait problem.  Skin: Negative for pallor and rash.  Neurological: Positive for weakness and light-headedness. Negative for dizziness, tremors, numbness and headaches.  Psychiatric/Behavioral: Negative for confusion, sleep disturbance and suicidal ideas.    Objective:  BP 124/82 (BP Location: Left Arm, Patient Position: Sitting, Cuff Size: Normal)   Pulse 86   Temp 98.2 F (36.8 C) (Oral)   Ht 5'  0.75" (1.543 m)   Wt 131 lb (59.4 kg)   SpO2 99%   BMI 24.96 kg/m   BP Readings from Last 3 Encounters:  05/19/18 124/82  03/17/18 124/80  12/02/17 112/66    Wt Readings from Last 3 Encounters:  05/19/18 131 lb (59.4 kg)  03/17/18 130 lb (59 kg)  12/02/17 126 lb (57.2 kg)    Physical Exam  Constitutional: She appears well-developed. No distress.  HENT:  Head: Normocephalic.  Right Ear: External ear normal.  Left Ear: External ear normal.  Nose: Nose normal.  Mouth/Throat: Oropharynx is clear and moist.  Eyes: Pupils are equal, round, and reactive to light. Conjunctivae are normal. Right eye exhibits no discharge. Left eye exhibits no discharge.  Neck: Normal range of motion. Neck supple. No JVD present. No tracheal  deviation present. No thyromegaly present.  Cardiovascular: Normal rate, regular rhythm and normal heart sounds.  Pulmonary/Chest: No stridor. No respiratory distress. She has no wheezes.  Abdominal: Soft. Bowel sounds are normal. She exhibits no distension and no mass. There is no tenderness. There is no rebound and no guarding.  Musculoskeletal: She exhibits no edema or tenderness.  Lymphadenopathy:    She has no cervical adenopathy.  Neurological: She displays normal reflexes. No cranial nerve deficit. She exhibits normal muscle tone. Coordination normal.  Skin: No rash noted. No erythema.  Psychiatric: She has a normal mood and affect. Her behavior is normal. Judgment and thought content normal.  not tearful Dizzy when standing with SBP drop of 18 mm Hg Lab Results  Component Value Date   WBC 4.6 03/18/2018   HGB 13.9 03/18/2018   HCT 40.1 03/18/2018   PLT 213.0 03/18/2018   GLUCOSE 97 03/18/2018   CHOL 204 (H) 03/18/2018   TRIG 57.0 03/18/2018   HDL 69.20 03/18/2018   LDLCALC 123 (H) 03/18/2018   ALT 11 03/18/2018   AST 14 03/18/2018   NA 142 03/18/2018   K 4.0 03/18/2018   CL 106 03/18/2018   CREATININE 0.60 03/18/2018   BUN 13 03/18/2018   CO2 27 03/18/2018   TSH 2.85 03/18/2018    Mm Screening Breast Tomo Bilateral  Result Date: 08/05/2017 CLINICAL DATA:  Screening. EXAM: 2D DIGITAL SCREENING BILATERAL MAMMOGRAM WITH CAD AND ADJUNCT TOMO COMPARISON:  Previous exam(s). ACR Breast Density Category b: There are scattered areas of fibroglandular density. FINDINGS: There are no findings suspicious for malignancy. Images were processed with CAD. IMPRESSION: No mammographic evidence of malignancy. A result letter of this screening mammogram will be mailed directly to the patient. RECOMMENDATION: Screening mammogram in one year. (Code:SM-B-01Y) BI-RADS CATEGORY  1: Negative. Electronically Signed   By: Amie Portland M.D.   On: 08/05/2017 14:28    Assessment & Plan:   There  are no diagnoses linked to this encounter.   No orders of the defined types were placed in this encounter.    Follow-up: No follow-ups on file.  Sonda Primes, MD

## 2018-05-19 NOTE — Assessment & Plan Note (Signed)
Dizzy when standing with SBP drop of 18 mm Hg Trial of florinef

## 2018-05-19 NOTE — Assessment & Plan Note (Signed)
No change 

## 2018-05-19 NOTE — Assessment & Plan Note (Addendum)
Dizzy when standing with SBP drop of 18 mm Hg Will try Florinef CFS

## 2018-05-20 ENCOUNTER — Ambulatory Visit: Payer: Medicare HMO | Admitting: Cardiology

## 2018-05-20 ENCOUNTER — Encounter: Payer: Self-pay | Admitting: Cardiology

## 2018-05-20 VITALS — BP 102/66 | HR 95 | Ht 60.0 in | Wt 131.0 lb

## 2018-05-20 DIAGNOSIS — R42 Dizziness and giddiness: Secondary | ICD-10-CM | POA: Diagnosis not present

## 2018-05-20 DIAGNOSIS — R5382 Chronic fatigue, unspecified: Secondary | ICD-10-CM | POA: Diagnosis not present

## 2018-05-20 DIAGNOSIS — E785 Hyperlipidemia, unspecified: Secondary | ICD-10-CM

## 2018-05-20 DIAGNOSIS — Z7189 Other specified counseling: Secondary | ICD-10-CM | POA: Diagnosis not present

## 2018-05-20 DIAGNOSIS — I951 Orthostatic hypotension: Secondary | ICD-10-CM

## 2018-05-20 NOTE — Patient Instructions (Signed)
Medication Instructions: Your physician recommends that you continue on your current medications as directed.    If you need a refill on your cardiac medications before your next appointment, please call your pharmacy.   Labwork: None  Procedures/Testing: None  Follow-Up: Your physician wants you to follow-up in 3 months with Dr. Harrell Gave. follow-up appointment.   Special Instructions:    Thank you for choosing [facility]!!

## 2018-05-20 NOTE — Progress Notes (Signed)
Cardiology Office Note:    Date:  05/20/2018   ID:  Shannon Sutton, DOB 1946/05/21, MRN [id number]  PCP:  Tresa Garter, MD  Cardiologist:  Jodelle Red, MD PhD  Referring MD: Tresa Garter, MD   Chief Complaint  Patient presents with  . Follow-up  . Headache  . Shortness of Breath    History of Present Illness:    Shannon Sutton is a 72 y.o. female with a hx of eosinophilic fasciitis, depression who is seen at the request of Dr. Posey Rea for evaluate and management of orthostatic symptoms.  Patient's concerns: dizziness/fatigue, cardiac prevention  Started getting lightheaded in 02/2017. Thought it was an antidepressant, but even with changing medications her symptoms have progressed. Very fatigued, limited in exertion, very lightheaded/dizzy with exertion and changing position. Not vertigo. Worst with bending over and picking head up. No syncope. Happens every day. Lasts all day long. Very light sensitive. Does better as it gets later and the sun goes down. Dizzyness is "like when you step out of a dark theater and into the light."   She is concerned about chronic fatigue syndrome. She does have a history of autoimmune disease (eosinophilic fascitis). Was on 60 mg prednisone daily, followed at [facility]. Was supposed to take methotrexate but was nervous about side effects and stopped taking.   Notes that it is hard to walk. Has developed fine tremors. Difficult for her to write now. Notes that she "walks like an old woman." Walking slower, taking shorter steps per her opinion.  Sleep: takes trazodone and clonazepam (for years). No strange dreams or vivid dreams on this, but has had that as a side effect of past medications. Is back on fluoxetine, has helped with tearfulness but not fatigue.   Tries to be active, used to do yoga. Able to mow the lawn yesterday. Diffuse myalgias but tries not to be completely sedentary.  Both her parents had heart disease. She is  concerned and wants to make sure it's not her heart.   Denies chest pain, shortness of breath, syncope, PND, orthopnea, LE edema.   Past Medical History:  Diagnosis Date  . Adenomatous colon polyp 1990  . Allergy   . Anxiety 12/15/2013  . Arthritis   . Cataract   . Chronic constipation   . Depression 11/16/2012  . Diverticulitis   . Diverticulosis   . 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 External hemorrhoids   . Fibromyalgia with myofascial pain 11/16/2012  . Headaches, cluster   . Hypercholesterolemia 12/15/2013  . Hypertension   . Insomnia 11/16/2012  . Myalgia and myositis, unspecified   . Other disorder of muscle, ligament, and fascia   . Other specified disease of hair and hair follicles   . Pain in joint, multiple sites   . Restless leg syndrome 12/15/2013  . Unspecified sleep apnea   . Urine incontinence     Past Surgical History:  Procedure Laterality Date  . ABDOMINOPLASTY  2006   "tummy tuck"  . BREAST REDUCTION SURGERY  1995  . BREAST SURGERY  1981   left breast biopsy  . CATARACT EXTRACTION, BILATERAL    . MUSCLE BIOPSY  2009   DEEP TISSUE  . REDUCTION MAMMAPLASTY    . REFRACTIVE SURGERY    . TONSILLECTOMY    . TOTAL ABDOMINAL HYSTERECTOMY  1999  . WISDOM TOOTH EXTRACTION      Current Medications: Current Outpatient Medications on File Prior to Visit  Medication Sig  . B Complex-C (SUPER  B COMPLEX) TABS Take 1 tablet by mouth daily.   . Calcium Citrate-Vitamin D (EQ CALCIUM CITRATE+D3 PO)   . clonazePAM (KLONOPIN) 0.5 MG tablet Take 1-2 tablets (0.5-1 mg total) by mouth at bedtime as needed.  . Coenzyme Q10 (COQ-10) 100 MG CAPS Take 1 tablet by mouth daily.   . cyclobenzaprine (FLEXERIL) 5 MG tablet Take 1 tablet (5 mg total) by mouth 2 (two) times daily as needed for muscle spasms.  Marland Kitchen DHEA 50 MG TABS   . fludrocortisone (FLORINEF) 0.1 MG tablet Take 1 tablet (0.1 mg total) by mouth daily. Take in am.  . FLUoxetine (PROZAC) 20 MG capsule Take 1 capsule (20 mg  total) by mouth daily.  . folic acid (FOLVITE) 800 MCG tablet Take 400 mcg by mouth daily.  Marland Kitchen LINZESS 290 MCG CAPS capsule TAKE 1 CAPSULE BY MOUTH ONCE DAILY  . magnesium 30 MG tablet Take 30 mg by mouth 2 (two) times daily.  . Melatonin 5 MG CAPS Take by mouth.  . Multiple Vitamins-Minerals (MULTIVITAMIN WITH MINERALS) tablet Take 1 tablet by mouth daily.    Marland Kitchen rOPINIRole (REQUIP) 0.25 MG tablet Take 1-2 tablets (0.25-0.5 mg total) by mouth at bedtime.  . rosuvastatin (CRESTOR) 5 MG tablet Take 1 tablet (5 mg total) by mouth daily.  . traZODone (DESYREL) 50 MG tablet TAKE ONE TABLET BY MOUTH AT BEDTIME  . TURMERIC CURCUMIN PO   . vitamin C (ASCORBIC ACID) 500 MG tablet    Current Facility-Administered Medications on File Prior to Visit  Medication  . 0.9 %  sodium chloride infusion     Allergies:   Crestor [rosuvastatin calcium]; Cymbalta [duloxetine hcl]; Effexor xr [venlafaxine hcl er]; Morphine and related; Pravastatin; and Promethazine-codeine   Social History   Socioeconomic History  . Marital status: Divorced    Spouse name: Not on file  . Number of children: 2  . Years of education: Not on file  . Highest education level: Not on file  Occupational History  . Occupation: Retired    Associate Professor: RETIRED    CommentEquities trader  Social Needs  . Financial resource strain: Not on file  . Food insecurity:    Worry: Not on file    Inability: Not on file  . Transportation needs:    Medical: Not on file    Non-medical: Not on file  Tobacco Use  . Smoking status: Former Smoker    Last attempt to quit: 12/19/1986    Years since quitting: 31.4  . Smokeless tobacco: Never Used  Substance and Sexual Activity  . Alcohol use: Yes    Alcohol/week: 1.0 standard drinks    Types: 1 Glasses of wine per week    Comment: ONCE OR TWICE A YEAR WILL HAVE WINE  . Drug use: No  . Sexual activity: Yes    Birth control/protection: Surgical, Post-menopausal  Lifestyle  . Physical  activity:    Days per week: Not on file    Minutes per session: Not on file  . Stress: Not on file  Relationships  . Social connections:    Talks on phone: Not on file    Gets together: Not on file    Attends religious service: Not on file    Active member of club or organization: Not on file    Attends meetings of clubs or organizations: Not on file    Relationship status: Not on file  Other Topics Concern  . Not on file  Social History Narrative  Lives alone   Education- 2 years of college   2 daughters     Family History: The patient's family history includes Breast cancer in her paternal grandmother; COPD in her brother and sister; Colon cancer (age of onset: 60) in her father; Colon polyps in her mother and paternal grandfather; Emphysema in her brother and sister; Heart attack in her mother; Heart disease in her father, mother, other, and paternal grandfather; Lung cancer in her brother; Parkinson's disease in her other. There is no history of Stomach cancer, Rectal cancer, or Esophageal cancer.  ROS:   Please see the history of present illness.  Additional pertinent ROS: Review of Systems  Constitutional: Positive for malaise/fatigue. Negative for chills, diaphoresis and fever.  HENT: Negative for ear pain and hearing loss.   Eyes: Positive for blurred vision and pain.       Dry eyes, light sensitive  Respiratory: Negative for cough, shortness of breath and wheezing.   Cardiovascular: Negative for chest pain, orthopnea, claudication, leg swelling and PND.  Gastrointestinal: Positive for abdominal pain. Negative for blood in stool and melena.  Genitourinary: Negative for dysuria and hematuria.  Musculoskeletal: Positive for myalgias. Negative for joint pain.  Skin: Negative for itching and rash.  Neurological: Positive for dizziness and tremors. Negative for loss of consciousness.  Endo/Heme/Allergies: Does not bruise/bleed easily.   EKGs/Labs/Other Studies Reviewed:     The following studies were reviewed today: Recent PCP notes  EKG:  EKG is ordered today.  The ekg ordered today demonstrates normal sinus rhythm  Recent Labs: 03/18/2018: ALT 11; BUN 13; Creatinine, Ser 0.60; Hemoglobin 13.9; Platelets 213.0; Potassium 4.0; Sodium 142; TSH 2.85  Recent Lipid Panel    Component Value Date/Time   CHOL 204 (H) 03/18/2018 0814   TRIG 57.0 03/18/2018 0814   HDL 69.20 03/18/2018 0814   CHOLHDL 3 03/18/2018 0814   VLDL 11.4 03/18/2018 0814   LDLCALC 123 (H) 03/18/2018 0814  lipids were before restarting rosuvastatin 5 mg daily  Physical Exam:    VS:  BP 102/66 (BP Location: Left Arm, Patient Position: Sitting, Cuff Size: Normal)   Pulse 95   Ht 5' (1.524 m)   Wt 131 lb (59.4 kg)   BMI 25.58 kg/m     Wt Readings from Last 3 Encounters:  05/20/18 131 lb (59.4 kg)  05/19/18 131 lb (59.4 kg)  03/17/18 130 lb (59 kg)    Orthostatic VS for the past 24 hrs (Last 3 readings):  BP- Lying Pulse- Lying BP- Sitting Pulse- Sitting BP- Standing at 0 minutes Pulse- Standing at 0 minutes BP- Standing at 3 minutes Pulse- Standing at 3 minutes  05/20/18 0808 113/74 89 113/73 98 100/69 99 100/70 99   GEN: Well developed in no acute distress HEENT: Normal NECK: No JVD; No carotid bruits LYMPHATICS: No lymphadenopathy CARDIAC: regular rhythm, normal S1 and S2, no murmurs, rubs, gallops. Radial and DP pulses 2+ bilaterally. RESPIRATORY:  Clear to auscultation without rales, wheezing or rhonchi  ABDOMEN: Soft, non-tender, non-distended MUSCULOSKELETAL:  No edema; No deformity  SKIN: Warm and dry NEUROLOGIC:  Alert and oriented x 3. No significant resting tremor, slight intention tremor. Gait slow but normal. Difficulty with unsteadyness with heel to toe walking. PSYCHIATRIC:  Normal affect   ASSESSMENT:    1. Orthostatic hypotension   2. Dyslipidemia   3. Light-headedness   4. Chronic fatigue   5. Counseling on health promotion and disease prevention     PLAN:    1. Lightheadedness,  concern for orthostatic hypotension -not technically orthostatic on exam today. Symptoms do occur with position changes predominantly but not exclusively. Would recommend not starting the florinef yet, pending rheumatology evaluation (as below). Did discuss hydration. Exercise is limited given her diffuse myalgias and fatigue, so will reassess in the future. -counseled on slow position changes  2. Fatigue: persistent, overwhelming -recommend checking an ESR and CRP with her blood work prior to her next PCP visit -given her history, recommend rheumatology follow up. Dr. [facility]re [facility]th at [facility] may be a good option for her. -history was concerning for a Parkinsonian type condition (has two members of her extended family with Parkinson's). Does have heel-toe walk difficulty, but no pill rolling tremor or stilted gait. She has seen neurology in the past. -has also seen psychiatry and is taking fluoxetine; while this has helped her mood, it has not affected her fatigue. -fatigue and myalgia were prior to restarting statin and have not changed since starting. Do not think statin is contributing.  3. Cardiovascular prevention: family history of heart disease. Her prior lipids were drawn before restarting rosuvastatin. She is scheduled for a recheck in 1 mos -follow up results of lipids and calculate ASCVD risk at next visit. Does have family history, which is not accounted for in risk calculator -diet and exercise counseling: limited in her activity due to fatigue, will follow in 3 mos. -no hypertension, diabetes, current tobacco use as risk factors  Plan for follow up: 3 mos to discuss cardiac prevention, after results of repeat lipid tests  Medication Adjustments/Labs and Tests Ordered: Current medicines are reviewed at length with the patient today.  Concerns regarding medicines are outlined above.  Orders Placed This Encounter  Procedures  . EKG 12-Lead   No  orders of the defined types were placed in this encounter.   Patient Instructions  Medication Instructions: Your physician recommends that you continue on your current medications as directed.    If you need a refill on your cardiac medications before your next appointment, please call your pharmacy.   Labwork: None  Procedures/Testing: None  Follow-Up: Your physician wants you to follow-up in 3 months with Dr. Cristal Deer. follow-up appointment.   Special Instructions:    Thank you for choosing [facility] at [facility]!!       Signed, Jodelle Red, MD PhD 05/20/2018 10:06 AM    [facility]

## 2018-05-24 NOTE — Telephone Encounter (Signed)
Duplicate message sent to PCP

## 2018-05-25 ENCOUNTER — Telehealth: Payer: Self-pay | Admitting: Cardiology

## 2018-05-25 DIAGNOSIS — M797 Fibromyalgia: Secondary | ICD-10-CM

## 2018-05-25 NOTE — Telephone Encounter (Signed)
New Message       Patient is looking for addition blood work from [place] per Dr. Harrell Gave. Please call to advise

## 2018-05-25 NOTE — Telephone Encounter (Signed)
Spoke with pt.  Adv her that [facility] does not draw outside labs.  Pt will need to have the labs that Dr.Christopher has recommended at our office.  Confirmed with Dr.Christopher that the labs to be ordered are an ESR and CRP. Orders in [place].  Provided the pt with our lab hours. Pt sts that she will come in tomorrow to have the labs drawn.  Pt voiced appreciation for the assistance

## 2018-05-26 ENCOUNTER — Other Ambulatory Visit: Payer: Self-pay

## 2018-05-26 DIAGNOSIS — M797 Fibromyalgia: Secondary | ICD-10-CM

## 2018-05-27 ENCOUNTER — Telehealth: Payer: Self-pay | Admitting: Cardiology

## 2018-05-27 LAB — SEDIMENTATION RATE: SED RATE: 2 mm/h (ref 0–40)

## 2018-05-27 LAB — C-REACTIVE PROTEIN: CRP: 1 mg/L (ref 0–10)

## 2018-05-27 NOTE — Telephone Encounter (Signed)
New Message         Patient wants a call back for her test results

## 2018-05-27 NOTE — Telephone Encounter (Signed)
Pt was asking how to view EKG from 8/8 office visit .Per pt was on my chart and received lab notification to view EKG but is unable after discussing with other staff pt's are not able to see the actual EKG .Pt aware .Adonis Housekeeper

## 2018-05-27 NOTE — Telephone Encounter (Signed)
New Message    Pt c/o medication issue:  1. Name of Medication: fludrocortisone     2. How are you currently taking this medication (dosage and times per day)?  3. Are you having a reaction (difficulty breathing--STAT)?   4. What is your medication issue? Patient is calling to see whether or not she should start the medication that her PCP is wanting her to start. Please call.

## 2018-05-27 NOTE — Telephone Encounter (Signed)
Reviewed pt's med list with pharmacists and pt may take this med Per [place] the patient notified ./cy

## 2018-05-27 NOTE — Telephone Encounter (Signed)
New Message    Pt c/o medication issue:  1. Name of Medication: fludrocortisone                2. How are you currently taking this medication (dosage and times per day)?  3. Are you having a reaction (difficulty breathing--STAT)?   4. What is your medication issue? Patient is calling to see whether or not she should start the medication that her PCP is wanting her to start. Please call.

## 2018-06-07 ENCOUNTER — Ambulatory Visit: Payer: Medicare HMO | Admitting: Diagnostic Neuroimaging

## 2018-06-23 ENCOUNTER — Ambulatory Visit (INDEPENDENT_AMBULATORY_CARE_PROVIDER_SITE_OTHER): Payer: Medicare HMO | Admitting: Internal Medicine

## 2018-06-23 ENCOUNTER — Encounter: Payer: Self-pay | Admitting: Internal Medicine

## 2018-06-23 VITALS — BP 128/80 | HR 68 | Temp 98.5°F | Ht 60.0 in | Wt 128.0 lb

## 2018-06-23 DIAGNOSIS — M354 Diffuse (eosinophilic) fasciitis: Secondary | ICD-10-CM | POA: Diagnosis not present

## 2018-06-23 DIAGNOSIS — R5382 Chronic fatigue, unspecified: Secondary | ICD-10-CM

## 2018-06-23 DIAGNOSIS — R270 Ataxia, unspecified: Secondary | ICD-10-CM | POA: Diagnosis not present

## 2018-06-23 DIAGNOSIS — R251 Tremor, unspecified: Secondary | ICD-10-CM

## 2018-06-23 DIAGNOSIS — R69 Illness, unspecified: Secondary | ICD-10-CM | POA: Diagnosis not present

## 2018-06-23 DIAGNOSIS — Z23 Encounter for immunization: Secondary | ICD-10-CM

## 2018-06-23 DIAGNOSIS — D7218 Eosinophilia in diseases classified elsewhere: Secondary | ICD-10-CM

## 2018-06-23 DIAGNOSIS — F329 Major depressive disorder, single episode, unspecified: Secondary | ICD-10-CM | POA: Diagnosis not present

## 2018-06-23 DIAGNOSIS — F419 Anxiety disorder, unspecified: Secondary | ICD-10-CM

## 2018-06-23 DIAGNOSIS — I951 Orthostatic hypotension: Secondary | ICD-10-CM

## 2018-06-23 NOTE — Addendum Note (Signed)
Addended by: Karren Cobble on: 06/23/2018 04:11 PM   Modules accepted: Orders

## 2018-06-23 NOTE — Assessment & Plan Note (Signed)
She would like to see Dr Amil Amen

## 2018-06-23 NOTE — Assessment & Plan Note (Signed)
Fluoxetine -re-started per Dr Casimiro Needle

## 2018-06-23 NOTE — Assessment & Plan Note (Signed)
S/p Cardiology eval

## 2018-06-23 NOTE — Assessment & Plan Note (Signed)
No change 

## 2018-06-23 NOTE — Assessment & Plan Note (Signed)
F/u w/Dr Tish Frederickson

## 2018-06-23 NOTE — Progress Notes (Signed)
Subjective:  Patient ID: Shannon Sutton, female    DOB: 03-27-1946  Age: 72 y.o. MRN: [id number]  CC: No chief complaint on file.   HPI LATANGA LEISURE presents for hand tremors L>R, depression, hand weakness - dropping things. C/o dizziness, stiff neck, hoarseness, anxiety - everything is getting worse. Stopping meds did not work. Seeing Dr Donell Beers  The pt wants to see dr Dierdre Forth ([place]) - eosinophilic vasculitis . S/p Neurol and Cardiology eval  Outpatient Medications Prior to Visit  Medication Sig Dispense Refill  . B Complex-C (SUPER B COMPLEX) TABS Take 1 tablet by mouth daily.     . Calcium Citrate-Vitamin D (EQ CALCIUM CITRATE+D3 PO)     . clonazePAM (KLONOPIN) 0.5 MG tablet Take 1-2 tablets (0.5-1 mg total) by mouth at bedtime as needed. 60 tablet 2  . Coenzyme Q10 (COQ-10) 100 MG CAPS Take 1 tablet by mouth daily.     . cyclobenzaprine (FLEXERIL) 5 MG tablet Take 1 tablet (5 mg total) by mouth 2 (two) times daily as needed for muscle spasms. 30 tablet 2  . DHEA 50 MG TABS     . fludrocortisone (FLORINEF) 0.1 MG tablet Take 1 tablet (0.1 mg total) by mouth daily. Take in am. 30 tablet 5  . FLUoxetine (PROZAC) 20 MG capsule Take 1 capsule (20 mg total) by mouth daily. 30 capsule 3  . folic acid (FOLVITE) 800 MCG tablet Take 400 mcg by mouth daily.    Marland Kitchen LINZESS 290 MCG CAPS capsule TAKE 1 CAPSULE BY MOUTH ONCE DAILY 90 capsule 3  . magnesium 30 MG tablet Take 30 mg by mouth 2 (two) times daily.    . Melatonin 5 MG CAPS Take by mouth.    . Multiple Vitamins-Minerals (MULTIVITAMIN WITH MINERALS) tablet Take 1 tablet by mouth daily.      Marland Kitchen rOPINIRole (REQUIP) 0.25 MG tablet Take 1-2 tablets (0.25-0.5 mg total) by mouth at bedtime. 60 tablet 5  . rosuvastatin (CRESTOR) 5 MG tablet Take 1 tablet (5 mg total) by mouth daily. 30 tablet 11  . traZODone (DESYREL) 50 MG tablet TAKE ONE TABLET BY MOUTH AT BEDTIME 90 tablet 3  . TURMERIC CURCUMIN PO     . vitamin C (ASCORBIC ACID) 500 MG  tablet      Facility-Administered Medications Prior to Visit  Medication Dose Route Frequency Provider Last Rate Last Dose  . 0.9 %  sodium chloride infusion  500 mL Intravenous Continuous Danis, Starr Lake III, MD        ROS: Review of Systems  Constitutional: Positive for fatigue. Negative for activity change, appetite change, chills and unexpected weight change.  HENT: Positive for congestion and voice change. Negative for mouth sores and sinus pressure.   Eyes: Positive for visual disturbance.  Respiratory: Negative for cough and chest tightness.   Gastrointestinal: Negative for abdominal pain and nausea.  Genitourinary: Negative for difficulty urinating, frequency and vaginal pain.  Musculoskeletal: Positive for arthralgias, gait problem and neck stiffness. Negative for back pain.  Skin: Negative for pallor and rash.  Neurological: Positive for dizziness, tremors, weakness and light-headedness. Negative for numbness and headaches.  Psychiatric/Behavioral: Positive for decreased concentration and dysphoric mood. Negative for confusion, sleep disturbance and suicidal ideas. The patient is nervous/anxious.     Objective:  BP 128/80 (BP Location: Left Arm, Patient Position: Sitting, Cuff Size: Normal)   Pulse 68   Temp 98.5 F (36.9 C) (Oral)   Ht 5' (1.524 m)   Wt 128  lb (58.1 kg)   SpO2 97%   BMI 25.00 kg/m   BP Readings from Last 3 Encounters:  06/23/18 128/80  05/20/18 102/66  05/19/18 110/70    Wt Readings from Last 3 Encounters:  06/23/18 128 lb (58.1 kg)  05/20/18 131 lb (59.4 kg)  05/19/18 131 lb (59.4 kg)    Physical Exam  Constitutional: She appears well-developed. No distress.  HENT:  Head: Normocephalic.  Right Ear: External ear normal.  Left Ear: External ear normal.  Nose: Nose normal.  Mouth/Throat: Oropharynx is clear and moist.  Eyes: Pupils are equal, round, and reactive to light. Conjunctivae are normal. Right eye exhibits no discharge. Left eye  exhibits no discharge.  Neck: Normal range of motion. Neck supple. No JVD present. No tracheal deviation present. No thyromegaly present.  Cardiovascular: Normal rate, regular rhythm and normal heart sounds.  Pulmonary/Chest: No stridor. No respiratory distress. She has no wheezes.  Abdominal: Soft. Bowel sounds are normal. She exhibits no distension and no mass. There is no tenderness. There is no rebound and no guarding.  Musculoskeletal: She exhibits no edema or tenderness.  Lymphadenopathy:    She has no cervical adenopathy.  Neurological: She displays normal reflexes. No cranial nerve deficit. She exhibits normal muscle tone. Coordination normal.  Skin: No rash noted. No erythema.  Psychiatric: She has a normal mood and affect. Her behavior is normal. Judgment and thought content normal.  looks well Mild tremor in fingers  Lab Results  Component Value Date   WBC 4.6 03/18/2018   HGB 13.9 03/18/2018   HCT 40.1 03/18/2018   PLT 213.0 03/18/2018   GLUCOSE 97 03/18/2018   CHOL 204 (H) 03/18/2018   TRIG 57.0 03/18/2018   HDL 69.20 03/18/2018   LDLCALC 123 (H) 03/18/2018   ALT 11 03/18/2018   AST 14 03/18/2018   NA 142 03/18/2018   K 4.0 03/18/2018   CL 106 03/18/2018   CREATININE 0.60 03/18/2018   BUN 13 03/18/2018   CO2 27 03/18/2018   TSH 2.85 03/18/2018    Mm Screening Breast Tomo Bilateral  Result Date: 08/05/2017 CLINICAL DATA:  Screening. EXAM: 2D DIGITAL SCREENING BILATERAL MAMMOGRAM WITH CAD AND ADJUNCT TOMO COMPARISON:  Previous exam(s). ACR Breast Density Category b: There are scattered areas of fibroglandular density. FINDINGS: There are no findings suspicious for malignancy. Images were processed with CAD. IMPRESSION: No mammographic evidence of malignancy. A result letter of this screening mammogram will be mailed directly to the patient. RECOMMENDATION: Screening mammogram in one year. (Code:SM-B-01Y) BI-RADS CATEGORY  1: Negative. Electronically Signed   By:  Amie Portland M.D.   On: 08/05/2017 14:28    Assessment & Plan:   There are no diagnoses linked to this encounter.   No orders of the defined types were placed in this encounter.    Follow-up: No follow-ups on file.  Sonda Primes, MD

## 2018-06-24 ENCOUNTER — Other Ambulatory Visit: Payer: Medicare HMO

## 2018-06-24 DIAGNOSIS — R251 Tremor, unspecified: Secondary | ICD-10-CM | POA: Diagnosis not present

## 2018-06-24 DIAGNOSIS — R5382 Chronic fatigue, unspecified: Secondary | ICD-10-CM

## 2018-06-25 ENCOUNTER — Ambulatory Visit (HOSPITAL_COMMUNITY): Payer: Self-pay | Admitting: Psychiatry

## 2018-06-30 LAB — ACETYLCHOLINE RECEPTOR, BINDING

## 2018-07-06 ENCOUNTER — Ambulatory Visit (INDEPENDENT_AMBULATORY_CARE_PROVIDER_SITE_OTHER): Payer: Medicare HMO | Admitting: Psychiatry

## 2018-07-06 VITALS — BP 122/70 | Ht 61.0 in | Wt 128.0 lb

## 2018-07-06 DIAGNOSIS — F324 Major depressive disorder, single episode, in partial remission: Secondary | ICD-10-CM | POA: Diagnosis not present

## 2018-07-06 DIAGNOSIS — R69 Illness, unspecified: Secondary | ICD-10-CM | POA: Diagnosis not present

## 2018-07-06 NOTE — Progress Notes (Signed)
Psychiatric Initial Adult Assessment   Patient Identification: Shannon Sutton MRN:  [id number] Date of Evaluation:  07/06/2018 Referral Source Dr. Alain Marion Chief Complaint:   Visit Diagnosis: No diagnosis found.  History of Present Illness:  This patient is a 72 year old white divorced mother is coming for evaluation for depression. She claims that in May 2018 she's been taking Prozac 20 mg and she apparently presented to her primary care doctor describing worsening physical symptoms. It is likely interpreted these were intact emotional mental symptoms and her Prozac dose was increased to 40 mg. Apparently she did not respond to this and her Prozac was then changed to Effexor. At some point she developed dizziness and first dose thought it might be related to the Effexor. Effexor was discontinued but her dizziness continued has continued since. At one point she was placed on Vybrid that use significant side effects. The patient is a vague way describes persistent daily depression is been going on for months since the change in medications. The patient is a fair historian. She describes herself as carvedilol. She sleeps well only she takes small dose of Klonopin and trazodone. Her appetite is normal. She's having some problems thinking and concentrating but she still functions. She describes a low self-esteem. She denies a feeling of worthlessness. She does admit that she has significant energy loss. She says that for sleep morning she feels okay but as the day goes when she gets more and more fatigued. She never take a nap. Patient is able to watch TV does enjoy golf. She says she used to go to the gym and goes that she rarely does. She describes a cluster physical symptoms of feeling dizzy, restless, low energy and muscle tension in her neck. The patient denies being suicidal now or ever. She denies the use of alcohol or illicit drugs. She's never been psychotic. She denies symptoms of mania.At one point  over a decade ago she was started on Prozac but could not remember exactly why. When she started the Prozac she said she felt better after that for a number of years. The patient denies specific symptoms of generalized anxiety disorder or panic disorder. She denies is OCD. Medical illnesses includes eosinophil fasciitis. She was treated with a number of medications but along the way seem to lose connection and face with her rheumatologist. She takes nothing for this condition at this time. The patient does have hypercholesterolemia. Past psychiatric history significant for that she has never been in a psychiatric hospital never been evaluated by a psychiatrist she's never been in therapy before. She essentially has a negative psychiatric history I was than the trial a number of different antidepressants. The patient went to 2 years of college. Her parents were intact while she was growing up. She never experienced any forms of abuse. The patient is very talkative and articulate. She says a #1 problem seems to be her health issues and her emotional reaction to it.  Associated Signs/Symptoms: Depression Symptoms:  depressed mood, (Hypo) Manic Symptoms:   Anxiety Symptoms:   Psychotic Symptoms:   PTSD Symptoms:   Past Psychiatric History: Prozac, Effexor,Vybrid  Previous Psychotropic Medications: Yes   Substance Abuse History in the last 12 months:  No.  Consequences of Substance Abuse:   Past Medical History:  Past Medical History:  Diagnosis Date  . Adenomatous colon polyp 1990  . Allergy   . Anxiety 12/15/2013  . Arthritis   . Cataract   . Chronic constipation   . Depression  11/16/2012  . Diverticulitis   . Diverticulosis   . Eosinophilic fasciitis 11/22/5619  . External hemorrhoids   . Fibromyalgia with myofascial pain 11/16/2012  . Headaches, cluster   . Hypercholesterolemia 12/15/2013  . Hypertension   . Insomnia 11/16/2012  . Myalgia and myositis, unspecified   . Other disorder of  muscle, ligament, and fascia   . Other specified disease of hair and hair follicles   . Pain in joint, multiple sites   . Restless leg syndrome 12/15/2013  . Unspecified sleep apnea   . Urine incontinence     Past Surgical History:  Procedure Laterality Date  . ABDOMINOPLASTY  2006   "tummy tuck"  . BREAST REDUCTION SURGERY  1995  . BREAST SURGERY  1981   left breast biopsy  . CATARACT EXTRACTION, BILATERAL    . MUSCLE BIOPSY  2009   DEEP TISSUE  . REDUCTION MAMMAPLASTY    . REFRACTIVE SURGERY    . TONSILLECTOMY    . TOTAL ABDOMINAL HYSTERECTOMY  1999  . WISDOM TOOTH EXTRACTION      Family Psychiatric History:   Family History:  Family History  Problem Relation Age of Onset  . Colon cancer Father 76  . Heart disease Father   . Colon polyps Mother   . Heart disease Mother   . Heart attack Mother   . COPD Sister   . Emphysema Sister   . COPD Brother   . Emphysema Brother   . Lung cancer Brother   . Breast cancer Paternal Grandmother   . Heart disease Other        family history  . Parkinson's disease Other   . Heart disease Paternal Grandfather   . Colon polyps Paternal Grandfather   . Stomach cancer Neg Hx   . Rectal cancer Neg Hx   . Esophageal cancer Neg Hx     Social History:   Social History   Socioeconomic History  . Marital status: Divorced    Spouse name: Not on file  . Number of children: 2  . Years of education: Not on file  . Highest education level: Not on file  Occupational History  . Occupation: Retired    Fish farm manager: RETIRED    CommentAstronomer  Social Needs  . Financial resource strain: Not on file  . Food insecurity:    Worry: Not on file    Inability: Not on file  . Transportation needs:    Medical: Not on file    Non-medical: Not on file  Tobacco Use  . Smoking status: Former Smoker    Last attempt to quit: 12/19/1986    Years since quitting: 31.5  . Smokeless tobacco: Never Used  Substance and Sexual Activity  .  Alcohol use: Yes    Alcohol/week: 1.0 standard drinks    Types: 1 Glasses of wine per week    Comment: ONCE OR TWICE A YEAR WILL HAVE WINE  . Drug use: No  . Sexual activity: Yes    Birth control/protection: Surgical, Post-menopausal  Lifestyle  . Physical activity:    Days per week: Not on file    Minutes per session: Not on file  . Stress: Not on file  Relationships  . Social connections:    Talks on phone: Not on file    Gets together: Not on file    Attends religious service: Not on file    Active member of club or organization: Not on file    Attends meetings of  clubs or organizations: Not on file    Relationship status: Not on file  Other Topics Concern  . Not on file  Social History Narrative   Lives alone   Education- 2 years of college   2 daughters    Additional Social History:   Allergies:   Allergies  Allergen Reactions  . Crestor [Rosuvastatin Calcium]     abd pain  . Cymbalta [Duloxetine Hcl]     Nausea   . Effexor Xr [Venlafaxine Hcl Er]     dizziness  . Morphine And Related Itching    agitation  . Pravastatin     abd pain  . Promethazine-Codeine Itching    Itching in face    Metabolic Disorder Labs: No results found for: HGBA1C, MPG No results found for: PROLACTIN Lab Results  Component Value Date   CHOL 204 (H) 03/18/2018   TRIG 57.0 03/18/2018   HDL 69.20 03/18/2018   CHOLHDL 3 03/18/2018   VLDL 11.4 03/18/2018   LDLCALC 123 (H) 03/18/2018   LDLCALC 87 08/17/2017     Current Medications: Current Outpatient Medications  Medication Sig Dispense Refill  . B Complex-C (SUPER B COMPLEX) TABS Take 1 tablet by mouth daily.     . Calcium Citrate-Vitamin D (EQ CALCIUM CITRATE+D3 PO)     . clonazePAM (KLONOPIN) 0.5 MG tablet Take 1-2 tablets (0.5-1 mg total) by mouth at bedtime as needed. 60 tablet 2  . Coenzyme Q10 (COQ-10) 100 MG CAPS Take 1 tablet by mouth daily.     . cyclobenzaprine (FLEXERIL) 5 MG tablet Take 1 tablet (5 mg total) by  mouth 2 (two) times daily as needed for muscle spasms. 30 tablet 2  . DHEA 50 MG TABS     . fludrocortisone (FLORINEF) 0.1 MG tablet Take 1 tablet (0.1 mg total) by mouth daily. Take in am. 30 tablet 5  . FLUoxetine (PROZAC) 20 MG capsule Take 1 capsule (20 mg total) by mouth daily. 30 capsule 3  . folic acid (FOLVITE) 536 MCG tablet Take 400 mcg by mouth daily.    Marland Kitchen LINZESS 290 MCG CAPS capsule TAKE 1 CAPSULE BY MOUTH ONCE DAILY 90 capsule 3  . magnesium 30 MG tablet Take 30 mg by mouth 2 (two) times daily.    . Melatonin 5 MG CAPS Take by mouth.    . Multiple Vitamins-Minerals (MULTIVITAMIN WITH MINERALS) tablet Take 1 tablet by mouth daily.      Marland Kitchen rOPINIRole (REQUIP) 0.25 MG tablet Take 1-2 tablets (0.25-0.5 mg total) by mouth at bedtime. 60 tablet 5  . rosuvastatin (CRESTOR) 5 MG tablet Take 1 tablet (5 mg total) by mouth daily. 30 tablet 11  . traZODone (DESYREL) 50 MG tablet TAKE ONE TABLET BY MOUTH AT BEDTIME 90 tablet 3  . TURMERIC CURCUMIN PO     . vitamin C (ASCORBIC ACID) 500 MG tablet      Current Facility-Administered Medications  Medication Dose Route Frequency Provider Last Rate Last Dose  . 0.9 %  sodium chloride infusion  500 mL Intravenous Continuous Danis, Kirke Corin, MD        Neurologic: Headache: No Seizure: No Paresthesias:No  Musculoskeletal: Strength & Muscle Tone: abnormal Gait & Station: unsteady Patient leans: N/A  Psychiatric Specialty Exam: ROS  Blood pressure 122/70, height _0  (1.549 m), weight 128 lb (58.1 kg).Body mass index is 24.19 kg/m.  General Appearance: Fairly Groomed  Eye Contact:  Good  Speech:  Clear and Coherent  Volume:  Normal  Mood:  Depressed  Affect:  Appropriate  Thought Process:  Goal Directed  Orientation:  Full (Time, Place, and Person)  Thought Content:  WDL  Suicidal Thoughts:  No  Homicidal Thoughts:  No  Memory:  NA  Judgement:  Good  Insight:  Fair  Psychomotor Activity:  Normal  Concentration:    Recall:   [place] of Knowledge:Good  Language: Fair  Akathisia:  No  Handed:  Right  AIMS (if indicated):    Assets:  Desire for Improvement  ADL's:  Intact  Cognition: WNL  Sleep:      Treatment Plan Summary: At this time this patient's presentation is complex.it seems there is some significant organic medical processes. It is hard to clearly say that her somatic symptoms are purely due to a depression disorder. The patient will clearly say if we met her on May 2018 she was doing much better. We discussed the possibility of using Cymbalta. I do not believe this patient has fibromyalgia. Her mother does Klonopin and trazodone works well for her sleep. At this time I will be conservative and ask her to go back to taking Prozac 20 mg. I will see her back in about 1-2 months and the possibility of adding Wellbutrin should be considered for the benefits it might have been her concentration and her energy. This patient is not suicidal. She's actually function fairly well.   Jerral Ralph, MD 9/24/20193:40 PM

## 2018-07-19 ENCOUNTER — Other Ambulatory Visit: Payer: Self-pay | Admitting: Internal Medicine

## 2018-07-19 DIAGNOSIS — Z1231 Encounter for screening mammogram for malignant neoplasm of breast: Secondary | ICD-10-CM

## 2018-07-28 DIAGNOSIS — M25511 Pain in right shoulder: Secondary | ICD-10-CM | POA: Diagnosis not present

## 2018-07-28 DIAGNOSIS — M25512 Pain in left shoulder: Secondary | ICD-10-CM | POA: Diagnosis not present

## 2018-07-28 DIAGNOSIS — M542 Cervicalgia: Secondary | ICD-10-CM | POA: Diagnosis not present

## 2018-08-04 DIAGNOSIS — M25512 Pain in left shoulder: Secondary | ICD-10-CM | POA: Diagnosis not present

## 2018-08-04 DIAGNOSIS — M25511 Pain in right shoulder: Secondary | ICD-10-CM | POA: Diagnosis not present

## 2018-08-04 DIAGNOSIS — M542 Cervicalgia: Secondary | ICD-10-CM | POA: Diagnosis not present

## 2018-08-20 ENCOUNTER — Ambulatory Visit: Payer: Medicare HMO | Admitting: Cardiology

## 2018-08-20 ENCOUNTER — Encounter: Payer: Self-pay | Admitting: Cardiology

## 2018-08-20 VITALS — BP 144/80 | HR 69 | Ht 61.0 in | Wt 128.0 lb

## 2018-08-20 DIAGNOSIS — R42 Dizziness and giddiness: Secondary | ICD-10-CM | POA: Diagnosis not present

## 2018-08-20 DIAGNOSIS — E785 Hyperlipidemia, unspecified: Secondary | ICD-10-CM

## 2018-08-20 DIAGNOSIS — R5382 Chronic fatigue, unspecified: Secondary | ICD-10-CM

## 2018-08-20 DIAGNOSIS — Z7189 Other specified counseling: Secondary | ICD-10-CM

## 2018-08-20 NOTE — Progress Notes (Signed)
Cardiology Office Note:    Date:  08/20/2018   ID:  Shannon Sutton, DOB 07/02/1946, MRN [id number]  PCP:  Tresa Garter, MD  Cardiologist:  Jodelle Red, MD PhD  Referring MD: Tresa Garter, MD   CC: follow up of symptoms  History of Present Illness:    Shannon Sutton is a 72 y.o. female with a hx of eosinophilic fasciitis, depression who is seen in follow up for dizziness, fatigue. Her initial consult with me was on 05/20/18. Summary of that visit was related to symptoms that started in 02/2017; progressive fatigue, lightheadedness with exertion and changing position. No syncope. Happens every day. Lasts all day long. Very light sensitive. She does have a history of autoimmune disease (eosinophilic fascitis). Notes that it is hard to walk. Has developed fine tremors. Difficult for her to write now.   Concerns today: she brings with her a list of symptoms that have progressed over the last six months. She is dizzy starting in the morning and lasting all day. She is exhausted even after 8-9 hours of sleep. She feels weak and shaky when she stands. She feels that her movements are slow. She has blurry vision and is photosensitive. She has all over muscle pain, and she feels that her hands/grip are weak. She also notes reduced sense of smell and hoarse voice. Her hands and feet also feel cold.  She is concerned because there does not seem to be a clear cause of her symptoms. We did an ESR/CRP at the last visit, which were unremarkable. She has seen both a neurologist and a psychiatrist, but there is still no clear etiology of her symptoms. She is able to go to the gym and be active, but she can't write or do many prior activities because of her symptoms.   She is concerned and wants to make sure it's not her heart, given her parents' histories of heart disease. Denies chest pain, shortness of breath, syncope, PND, orthopnea, LE edema.   She did not feel that the florinef helped  her at all, and she is no longer taking. She also has been taking rosuvastatin only intermittently.  Past Medical History:  Diagnosis Date  . Adenomatous colon polyp 1990  . Allergy   . Anxiety 12/15/2013  . Arthritis   . Cataract   . Chronic constipation   . Depression 11/16/2012  . Diverticulitis   . Diverticulosis   . 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 External hemorrhoids   . Fibromyalgia with myofascial pain 11/16/2012  . Headaches, cluster   . Hypercholesterolemia 12/15/2013  . Hypertension   . Insomnia 11/16/2012  . Myalgia and myositis, unspecified   . Other disorder of muscle, ligament, and fascia   . Other specified disease of hair and hair follicles   . Pain in joint, multiple sites   . Restless leg syndrome 12/15/2013  . Unspecified sleep apnea   . Urine incontinence     Past Surgical History:  Procedure Laterality Date  . ABDOMINOPLASTY  2006   "tummy tuck"  . BREAST REDUCTION SURGERY  1995  . BREAST SURGERY  1981   left breast biopsy  . CATARACT EXTRACTION, BILATERAL    . MUSCLE BIOPSY  2009   DEEP TISSUE  . REDUCTION MAMMAPLASTY    . REFRACTIVE SURGERY    . TONSILLECTOMY    . TOTAL ABDOMINAL HYSTERECTOMY  1999  . WISDOM TOOTH EXTRACTION      Current Medications: Current Outpatient Medications on File Prior  to Visit  Medication Sig  . B Complex-C (SUPER B COMPLEX) TABS Take 1 tablet by mouth daily.   . Calcium Citrate-Vitamin D (EQ CALCIUM CITRATE+D3 PO)   . clonazePAM (KLONOPIN) 0.5 MG tablet Take 1-2 tablets (0.5-1 mg total) by mouth at bedtime as needed.  . Coenzyme Q10 (COQ-10) 100 MG CAPS Take 1 tablet by mouth daily.   . cyclobenzaprine (FLEXERIL) 5 MG tablet Take 1 tablet (5 mg total) by mouth 2 (two) times daily as needed for muscle spasms.  Marland Kitchen DHEA 50 MG TABS   . FLUoxetine (PROZAC) 20 MG capsule Take 1 capsule (20 mg total) by mouth daily.  . folic acid (FOLVITE) 800 MCG tablet Take 400 mcg by mouth daily.  Marland Kitchen LINZESS 290 MCG CAPS capsule TAKE 1  CAPSULE BY MOUTH ONCE DAILY  . magnesium 30 MG tablet Take 30 mg by mouth 2 (two) times daily.  . Melatonin 5 MG CAPS Take 5 mg by mouth as needed.   . Multiple Vitamins-Minerals (MULTIVITAMIN WITH MINERALS) tablet Take 1 tablet by mouth daily.    Marland Kitchen rOPINIRole (REQUIP) 0.25 MG tablet Take 1-2 tablets (0.25-0.5 mg total) by mouth at bedtime. (Patient taking differently: Take 0.25-0.5 mg by mouth as needed. Take 1-2 tablets by mouth as needed at bedtime)  . rosuvastatin (CRESTOR) 5 MG tablet Take 1 tablet (5 mg total) by mouth daily.  . traZODone (DESYREL) 50 MG tablet TAKE ONE TABLET BY MOUTH AT BEDTIME  . TURMERIC CURCUMIN PO   . vitamin C (ASCORBIC ACID) 500 MG tablet    Current Facility-Administered Medications on File Prior to Visit  Medication  . 0.9 %  sodium chloride infusion     Allergies:   Crestor [rosuvastatin calcium]; Cymbalta [duloxetine hcl]; Effexor xr [venlafaxine hcl er]; Morphine and related; Pravastatin; and Promethazine-codeine   Social History   Socioeconomic History  . Marital status: Divorced    Spouse name: Not on file  . Number of children: 2  . Years of education: Not on file  . Highest education level: Not on file  Occupational History  . Occupation: Retired    Associate Professor: RETIRED    CommentEquities trader  Social Needs  . Financial resource strain: Not on file  . Food insecurity:    Worry: Not on file    Inability: Not on file  . Transportation needs:    Medical: Not on file    Non-medical: Not on file  Tobacco Use  . Smoking status: Former Smoker    Last attempt to quit: 12/19/1986    Years since quitting: 31.6  . Smokeless tobacco: Never Used  Substance and Sexual Activity  . Alcohol use: Yes    Alcohol/week: 1.0 standard drinks    Types: 1 Glasses of wine per week    Comment: ONCE OR TWICE A YEAR WILL HAVE WINE  . Drug use: No  . Sexual activity: Yes    Birth control/protection: Surgical, Post-menopausal  Lifestyle  . Physical  activity:    Days per week: Not on file    Minutes per session: Not on file  . Stress: Not on file  Relationships  . Social connections:    Talks on phone: Not on file    Gets together: Not on file    Attends religious service: Not on file    Active member of club or organization: Not on file    Attends meetings of clubs or organizations: Not on file    Relationship status: Not on  file  Other Topics Concern  . Not on file  Social History Narrative   Lives alone   Education- 2 years of college   2 daughters     Family History: The patient's family history includes Breast cancer in her paternal grandmother; COPD in her brother and sister; Colon cancer (age of onset: 43) in her father; Colon polyps in her mother and paternal grandfather; Emphysema in her brother and sister; Heart attack in her mother; Heart disease in her father, mother, other, and paternal grandfather; Lung cancer in her brother; Parkinson's disease in her other. There is no history of Stomach cancer, Rectal cancer, or Esophageal cancer.  ROS:   Please see the history of present illness.  Additional pertinent ROS: Review of Systems  Constitutional: Positive for malaise/fatigue. Negative for chills, diaphoresis and fever.  HENT: Negative for ear pain and hearing loss.   Eyes: Positive for blurred vision and pain.       Dry eyes, light sensitive  Respiratory: Negative for cough, shortness of breath and wheezing.   Cardiovascular: Negative for chest pain, orthopnea, claudication, leg swelling and PND.  Gastrointestinal: Positive for abdominal pain. Negative for blood in stool and melena.  Genitourinary: Negative for dysuria and hematuria.  Musculoskeletal: Positive for myalgias. Negative for joint pain.  Skin: Negative for itching and rash.  Neurological: Positive for dizziness and tremors. Negative for loss of consciousness.  Endo/Heme/Allergies: Does not bruise/bleed easily.   EKGs/Labs/Other Studies Reviewed:     The following studies were reviewed today: Recent PCP notes  EKG:  EKG is personally reviewed.  The ekg ordered previously demonstrates normal sinus rhythm  Recent Labs: 03/18/2018: ALT 11; BUN 13; Creatinine, Ser 0.60; Hemoglobin 13.9; Platelets 213.0; Potassium 4.0; Sodium 142; TSH 2.85  Recent Lipid Panel    Component Value Date/Time   CHOL 204 (H) 03/18/2018 0814   TRIG 57.0 03/18/2018 0814   HDL 69.20 03/18/2018 0814   CHOLHDL 3 03/18/2018 0814   VLDL 11.4 03/18/2018 0814   LDLCALC 123 (H) 03/18/2018 0814  lipids were before restarting rosuvastatin 5 mg daily  Physical Exam:    VS:  BP (!) 144/80 (BP Location: Left Arm, Cuff Size: Normal)   Pulse 69   Ht 5\' 1"  (1.549 m)   Wt 128 lb (58.1 kg)   BMI 24.19 kg/m     Wt Readings from Last 3 Encounters:  08/20/18 128 lb (58.1 kg)  06/23/18 128 lb (58.1 kg)  05/20/18 131 lb (59.4 kg)    Orthostatic VS for the past 24 hrs (Last 3 readings):  BP- Lying Pulse- Lying BP- Sitting Pulse- Sitting BP- Standing at 0 minutes Pulse- Standing at 0 minutes BP- Standing at 3 minutes Pulse- Standing at 3 minutes  08/20/18 1145 136/76 68 139/79 64 124/83 71 134/78 70   GEN: Well developed in no acute distress HEENT: Normal NECK: No JVD; No carotid bruits LYMPHATICS: No lymphadenopathy CARDIAC: regular rhythm, normal S1 and S2, no murmurs, rubs, gallops. Radial and DP pulses 2+ bilaterally. RESPIRATORY:  Clear to auscultation without rales, wheezing or rhonchi  ABDOMEN: Soft, non-tender, non-distended MUSCULOSKELETAL:  No edema; No deformity  SKIN: Warm and dry. Normal capillary refill NEUROLOGIC:  Alert and oriented x 3. No significant resting tremor, slight intention tremor. Gait slow but normal. Difficulty with unsteadyness with heel to toe walking. PSYCHIATRIC:  Normal affect   ASSESSMENT:    1. Dyslipidemia   2. Chronic fatigue   3. Dizzinesses   4. Counseling on health promotion  and disease prevention    PLAN:     1. Lightheadedness: not orthostatic -does not fit with cardiac cause. Unclear etiology, but no evidence of dysautonomia by vital signs.  2. Fatigue: persistent, overwhelming -continues with multidisciplinary workup. Does not appear cardiac in nature, but no other clear etiology either. Inflammatory markers were unrevealing, thyroid was normal. No evidence of adrenal insufficiency based on vitals, but not further evaluated by labs. Has been seen by neurology and psychiatry. She is very concerned that it is Parkinsonian in nature given her symptoms. -fatigue and myalgia were prior to restarting statin and have not changed since starting. Do not think statin is contributing. However, given the lack of clarity regarding her etiology, would not push to increase statin use at this time. I would hold on being aggressive with it until her symptoms are better understood.  3. Cardiovascular prevention: family history of heart disease. Her prior lipids were drawn before restarting rosuvastatin.  -Given that she has only being taking rosuvastatin occasionally, would not recheck lipids at this time -at next visit, would see how she is doing and restart rosuvastatin at that time.  -follow up results of lipids and calculate ASCVD risk after statin use consistently for >6 weeks -Does have family history, which is not accounted for in risk calculator -diet and exercise counseling: limited in her activity due to fatigue -no hypertension, diabetes, current tobacco use as risk factors  Plan for follow up: 6 mos to discuss cardiac prevention and monitor symptoms  TIME SPENT WITH PATIENT: >25 minutes of direct patient care. More than 50% of that time was spent on coordination of care and counseling regarding possible etiology of her symptoms, reassurance from a cardiac perspective.  Medication Adjustments/Labs and Tests Ordered: Current medicines are reviewed at length with the patient today.  Concerns regarding  medicines are outlined above.  No orders of the defined types were placed in this encounter.  No orders of the defined types were placed in this encounter.   Patient Instructions  Medication Instructions:  Your Physician recommend you continue on your current medication as directed.    If you need a refill on your cardiac medications before your next appointment, please call your pharmacy.   Lab work: None   Testing/Procedures: None  Follow-Up: At [facility], you and your health needs are our priority.  As part of our continuing mission to provide you with exceptional heart care, we have created designated Provider Care Teams.  These Care Teams include your primary Cardiologist (physician) and Advanced Practice Providers (APPs -  Physician Assistants and Nurse Practitioners) who all work together to provide you with the care you need, when you need it. You will need a follow up appointment in 6 months.  Please call our office 2 months in advance to schedule this appointment.  You may see Jodelle Red, MD or one of the following Advanced Practice Providers on your designated Care Team:   Theodore Demark, PA-C . Joni Reining, DNP, ANP  Any Other Special Instructions Will Be Listed Below (If Applicable).       Signed, Jodelle Red, MD PhD 08/20/2018 1:13 PM    [facility]

## 2018-08-20 NOTE — Patient Instructions (Signed)

## 2018-08-25 ENCOUNTER — Encounter

## 2018-08-25 ENCOUNTER — Ambulatory Visit (INDEPENDENT_AMBULATORY_CARE_PROVIDER_SITE_OTHER): Payer: Medicare HMO | Admitting: Diagnostic Neuroimaging

## 2018-08-25 ENCOUNTER — Encounter: Payer: Self-pay | Admitting: Diagnostic Neuroimaging

## 2018-08-25 VITALS — BP 123/77 | HR 79 | Ht 61.0 in | Wt 126.2 lb

## 2018-08-25 DIAGNOSIS — R531 Weakness: Secondary | ICD-10-CM

## 2018-08-25 DIAGNOSIS — R269 Unspecified abnormalities of gait and mobility: Secondary | ICD-10-CM | POA: Diagnosis not present

## 2018-08-25 DIAGNOSIS — R251 Tremor, unspecified: Secondary | ICD-10-CM

## 2018-08-25 MED ORDER — ROPINIROLE HCL 0.25 MG PO TABS: 0.2500 mg | ORAL_TABLET | Freq: Two times a day (BID) | ORAL | 6 refills | Status: DC

## 2018-08-25 NOTE — Patient Instructions (Addendum)
  PARKINSONISM  - increase ropinirole to 0.25mg  at every night at bedtime; after 1-2 weeks then increase up to 0.5mg  at bedtime; after 1-2 weeks then increase up to 0.5mg  twice a day  - continue physical therapy for gait and balance training  - improve nutrition, depression and anxiety treatment  - monitor blood pressure at home   [place] - headache diary - ibuprofen / tylenol as needed - may consider migraine prevention

## 2018-08-25 NOTE — Progress Notes (Signed)
[facility]  PATIENT: Shannon Sutton DOB: 1946/10/11  REFERRING CLINICIAN: Plotnikov, A HISTORY FROM: patient and daughter  REASON FOR VISIT: follow up    HISTORICAL  CHIEF COMPLAINT:  Chief Complaint  Patient presents with  . Follow-up    Rm 7, daughter, Mitzi Davenport  . Gait Problem    Rhand dominant tremors, weakness, freezes, L outer fingers, tremor when fatigues.  No falls.   . Tremors    HISTORY OF PRESENT ILLNESS:   UPDATE (08/27/18, VRP): Since last visit, symptoms are progressing.  Now patient having more problems with lightheadedness, dizziness, gait and balance difficulty, tremor, fatigue.  Also with more confusion, difficulty swallowing, swallowing difficulty hoarse voice, difficulty with handwriting.  Also reports increasing photosensitivity and blurred vision.  She does have history of headaches since age 72 years old with right temporal throbbing painful sensation associate with photophobia and nausea.  PRIOR HPI (12/02/17): 72 year old right-handed female here for evaluation of tremor, weakness, blurred vision, headaches, fatigue.  Evaluate for Parkinson symptoms.  Patient reports intermittent weakness and shaky sensation with heart racing feeling since February 2018.  Patient also having some intermittent tremor, mainly with certain postures and actions, mainly in her left hand, dating back to January 2017.  She had evaluation with movement disorder specialist Dr. Arbutus Leas late patient also reports in 2017, who felt that patient did not have Parkinson's disease at that time.  Long history of loss of smell.  Patient feels like her handwriting is worsening.  Patient also concerned about her family history of Parkinson's disease in her paternal grandfather and maternal aunt.  Patient also having issues with depression, confusion, memory loss, balance problems.  Patient has intermittent headaches with pressure sensation.  No nausea or vomiting.  No  photophobia or phonophobia.  Patient has been struggling with her vision and has gone through "$2000 worth" of different glasses and eye exams without benefit in her vision.    REVIEW OF SYSTEMS: Full 14 system review of systems performed and negative with exception of: Sleepiness restless legs confusion headache weakness dizziness depression anxiety skin sensitivity cramps aching muscles going cold fatigue palpitations hearing loss blurred vision loss of vision eye pain easy bruising trouble swallowing.   ALLERGIES: Allergies  Allergen Reactions  . Crestor [Rosuvastatin Calcium]     abd pain  . Cymbalta [Duloxetine Hcl]     Nausea   . Effexor Xr [Venlafaxine Hcl Er]     dizziness  . Morphine And Related Itching    agitation  . Pravastatin     abd pain  . Promethazine-Codeine Itching    Itching in face    HOME MEDICATIONS: Outpatient Medications Prior to Visit  Medication Sig Dispense Refill  . B Complex-C (SUPER B COMPLEX) TABS Take 1 tablet by mouth daily.     . Calcium Citrate-Vitamin D (EQ CALCIUM CITRATE+D3 PO)     . clonazePAM (KLONOPIN) 0.5 MG tablet Take 1-2 tablets (0.5-1 mg total) by mouth at bedtime as needed. 60 tablet 2  . Coenzyme Q10 (COQ-10) 100 MG CAPS Take 1 tablet by mouth daily.     . cyclobenzaprine (FLEXERIL) 5 MG tablet Take 1 tablet (5 mg total) by mouth 2 (two) times daily as needed for muscle spasms. 30 tablet 2  . DHEA 50 MG TABS     . FLUoxetine (PROZAC) 20 MG capsule Take 1 capsule (20 mg total) by mouth daily. 30 capsule 3  . folic acid (FOLVITE) 800 MCG tablet Take 400 mcg by  mouth daily.    Marland Kitchen LINZESS 290 MCG CAPS capsule TAKE 1 CAPSULE BY MOUTH ONCE DAILY 90 capsule 3  . magnesium 30 MG tablet Take 30 mg by mouth 2 (two) times daily.    . Melatonin 5 MG CAPS Take 5 mg by mouth as needed.     . Multiple Vitamins-Minerals (MULTIVITAMIN WITH MINERALS) tablet Take 1 tablet by mouth daily.      Marland Kitchen rOPINIRole (REQUIP) 0.25 MG tablet Take 1-2 tablets  (0.25-0.5 mg total) by mouth at bedtime. (Patient taking differently: Take 0.25-0.5 mg by mouth as needed. Take 1-2 tablets by mouth as needed at bedtime) 60 tablet 5  . rosuvastatin (CRESTOR) 5 MG tablet Take 1 tablet (5 mg total) by mouth daily. 30 tablet 11  . traZODone (DESYREL) 50 MG tablet TAKE ONE TABLET BY MOUTH AT BEDTIME 90 tablet 3  . TURMERIC CURCUMIN PO     . vitamin C (ASCORBIC ACID) 500 MG tablet      Facility-Administered Medications Prior to Visit  Medication Dose Route Frequency Provider Last Rate Last Dose  . 0.9 %  sodium chloride infusion  500 mL Intravenous Continuous Sherrilyn Rist, MD        PAST MEDICAL HISTORY: Past Medical History:  Diagnosis Date  . Adenomatous colon polyp 1990  . Allergy   . Anxiety 12/15/2013  . Arthritis   . Cataract   . Chronic constipation   . Depression 11/16/2012  . Diverticulitis   . Diverticulosis   . 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 External hemorrhoids   . Fibromyalgia with myofascial pain 11/16/2012  . Headaches, cluster   . Hypercholesterolemia 12/15/2013  . Hypertension   . Insomnia 11/16/2012  . Myalgia and myositis, unspecified   . Other disorder of muscle, ligament, and fascia   . Other specified disease of hair and hair follicles   . Pain in joint, multiple sites   . Restless leg syndrome 12/15/2013  . Unspecified sleep apnea   . Urine incontinence     PAST SURGICAL HISTORY: Past Surgical History:  Procedure Laterality Date  . ABDOMINOPLASTY  2006   "tummy tuck"  . BREAST REDUCTION SURGERY  1995  . BREAST SURGERY  1981   left breast biopsy  . CATARACT EXTRACTION, BILATERAL    . MUSCLE BIOPSY  2009   DEEP TISSUE  . REDUCTION MAMMAPLASTY    . REFRACTIVE SURGERY    . TONSILLECTOMY    . TOTAL ABDOMINAL HYSTERECTOMY  1999  . WISDOM TOOTH EXTRACTION      FAMILY HISTORY: Family History  Problem Relation Age of Onset  . Colon cancer Father 5  . Heart disease Father   . Colon polyps Mother   . Heart  disease Mother   . Heart attack Mother   . COPD Sister   . Emphysema Sister   . COPD Brother   . Emphysema Brother   . Lung cancer Brother   . Breast cancer Paternal Grandmother   . Heart disease Other        family history  . Parkinson's disease Other   . Heart disease Paternal Grandfather   . Colon polyps Paternal Grandfather   . Stomach cancer Neg Hx   . Rectal cancer Neg Hx   . Esophageal cancer Neg Hx     SOCIAL HISTORY:  Social History   Socioeconomic History  . Marital status: Divorced    Spouse name: Not on file  . Number of children: 2  . Years of  education: Not on file  . Highest education level: Not on file  Occupational History  . Occupation: Retired    Associate Professor: RETIRED    CommentEquities trader  Social Needs  . Financial resource strain: Not on file  . Food insecurity:    Worry: Not on file    Inability: Not on file  . Transportation needs:    Medical: Not on file    Non-medical: Not on file  Tobacco Use  . Smoking status: Former Smoker    Last attempt to quit: 12/19/1986    Years since quitting: 31.7  . Smokeless tobacco: Never Used  Substance and Sexual Activity  . Alcohol use: Yes    Alcohol/week: 1.0 standard drinks    Types: 1 Glasses of wine per week    Comment: ONCE OR TWICE A YEAR WILL HAVE WINE  . Drug use: No  . Sexual activity: Yes    Birth control/protection: Surgical, Post-menopausal  Lifestyle  . Physical activity:    Days per week: Not on file    Minutes per session: Not on file  . Stress: Not on file  Relationships  . Social connections:    Talks on phone: Not on file    Gets together: Not on file    Attends religious service: Not on file    Active member of club or organization: Not on file    Attends meetings of clubs or organizations: Not on file    Relationship status: Not on file  . Intimate partner violence:    Fear of current or ex partner: Not on file    Emotionally abused: Not on file    Physically  abused: Not on file    Forced sexual activity: Not on file  Other Topics Concern  . Not on file  Social History Narrative   Lives alone   Education- 2 years of college   2 daughters     PHYSICAL EXAM  GENERAL EXAM/CONSTITUTIONAL: Vitals:  Vitals:   08/25/18 0954  BP: 123/77  Pulse: 79  Weight: 126 lb 3.2 oz (57.2 kg)  Height: 5\' 1"  (1.549 m)   Orthostatic VS for the past 24 hrs (Last 3 readings):  BP- Lying Pulse- Lying BP- Sitting Pulse- Sitting BP- Standing at 0 minutes Pulse- Standing at 0 minutes  08/25/18 1012 131/76 83 122/77 83 115/76 76   Body mass index is 23.85 kg/m. No exam data present  Patient is in no distress; well developed, nourished and groomed; neck is supple  CARDIOVASCULAR:  Examination of carotid arteries is normal; no carotid bruits  Regular rate and rhythm, no murmurs  Examination of peripheral vascular system by observation and palpation is normal  EYES:  Ophthalmoscopic exam of optic discs and posterior segments is normal; no papilledema or hemorrhages  MUSCULOSKELETAL:  Gait, strength, tone, movements noted in Neurologic exam below  NEUROLOGIC: MENTAL STATUS:  No flowsheet data found.  awake, alert, oriented to person, place and time  recent and remote memory intact  normal attention and concentration  language fluent, comprehension intact, naming intact,   fund of knowledge appropriate  CRANIAL NERVE:   2nd - no papilledema on fundoscopic exam  2nd, 3rd, 4th, 6th - pupils equal and reactive to light, visual fields full to confrontation, extraocular muscles intact, no nystagmus  5th - facial sensation symmetric  7th - facial strength symmetric  8th - hearing intact  9th - palate elevates symmetrically, uvula midline  11th - shoulder shrug symmetric  12th -  tongue protrusion midline  MASKED FACIES  MILD HOARSE VOICE  MOTOR:   normal bulk and tone, full strength in the BUE, BLE  COGWHEELING IN LUE >  RUE  MODERATE BRADYKINESIA INE BUE AND BLE  NO TREMOR  SENSORY:   normal and symmetric to light touch, temperature, vibration  COORDINATION:   finger-nose-finger, fine finger movements normal  REFLEXES:   deep tendon reflexes 2 and symmetric  GAIT/STATION:   narrow based gait; SLIGHTLY UNSTEADY    DIAGNOSTIC DATA (LABS, IMAGING, TESTING) - I reviewed patient records, labs, notes, testing and imaging myself where available.  Lab Results  Component Value Date   WBC 4.6 03/18/2018   HGB 13.9 03/18/2018   HCT 40.1 03/18/2018   MCV 91.5 03/18/2018   PLT 213.0 03/18/2018      Component Value Date/Time   NA 142 03/18/2018 0814   NA 140 09/19/2013   K 4.0 03/18/2018 0814   CL 106 03/18/2018 0814   CO2 27 03/18/2018 0814   GLUCOSE 97 03/18/2018 0814   BUN 13 03/18/2018 0814   BUN 11 09/19/2013   CREATININE 0.60 03/18/2018 0814   CALCIUM 9.4 03/18/2018 0814   PROT 6.9 03/18/2018 0814   ALBUMIN 4.3 03/18/2018 0814   AST 14 03/18/2018 0814   ALT 11 03/18/2018 0814   ALKPHOS 51 03/18/2018 0814   BILITOT 0.3 03/18/2018 0814   Lab Results  Component Value Date   CHOL 204 (H) 03/18/2018   HDL 69.20 03/18/2018   LDLCALC 123 (H) 03/18/2018   TRIG 57.0 03/18/2018   CHOLHDL 3 03/18/2018   No results found for: HGBA1C Lab Results  Component Value Date   VITAMINB12 837 05/29/2016   Lab Results  Component Value Date   TSH 2.85 03/18/2018   07/11/13 MRI lumbar spine [I reviewed images myself and agree with interpretation. -VRP]  1. Grade 1 anterolisthesis at L4-5 measures 5 mm. 2. And mild left foraminal narrowing at L2-3. 3. Mild right lateral recess narrowib ng at L3-4. 4. Moderate right and mild left foraminal stenosis at L3-4. 5. Mild right lateral recess and foraminal stenosis at L4-5 secondary to a rightward disc herniation and facet hypertrophy. 6. Chronic loss of disc height at L5-S1 without significant stenosis.  04/08/17 MRI brain [I reviewed images  myself and agree with interpretation. -VRP]  - The pituitary appears mildly atrophic, but there are no features suggestive of microadenoma, macroadenoma, or parasellar mass. - Moderate atrophy and small vessel disease. No hypothalamic abnormality. - No acute or focal intracranial findings. Normal postcontrast appearance of the brain.    ASSESSMENT AND PLAN  72 y.o. year old female here with constellation of symptoms including fatigue, tremor, anxiety, headaches, vision changes since 2017.    Dx:  1. Tremor   2. Generalized weakness   3. Gait difficulty     PLAN:  PARKINSONISM (akinetic-rigid) - increase ropinirole to 0.25mg  at bedtime; then increase up to 0.5mg  at bedtime; then up to 0.5mg  twice a day  - may consider carb/levo in future - consider DATscan - continue physical therapy for gait and balance training - improve nutrition, depression and anxiety treatment - monitor BP  MIGRAINE WITH AURA - headache diary - ibuprofen / tylenol as needed - may consider migraine prevention  Meds ordered this encounter  Medications  . rOPINIRole (REQUIP) 0.25 MG tablet    Sig: Take 1-2 tablets (0.25-0.5 mg total) by mouth 2 (two) times daily.    Dispense:  120 tablet    Refill:  6   Return in about 4 months (around 12/24/2018).    Suanne Marker, MD 08/25/2018, 10:04 AM Certified in Neurology, Neurophysiology and Neuroimaging  [facility] [address] [place], [place] [address] [phone]

## 2018-08-26 ENCOUNTER — Ambulatory Visit
Admission: RE | Admit: 2018-08-26 | Discharge: 2018-08-26 | Disposition: A | Discharge status: Home / Self Care | Payer: Medicare HMO | Source: Ambulatory Visit | Attending: Internal Medicine | Admitting: Internal Medicine

## 2018-08-26 DIAGNOSIS — Z1231 Encounter for screening mammogram for malignant neoplasm of breast: Secondary | ICD-10-CM | POA: Diagnosis not present

## 2018-08-27 ENCOUNTER — Telehealth: Payer: Self-pay | Admitting: Internal Medicine

## 2018-08-27 NOTE — Telephone Encounter (Signed)
OK w/me. Thanks 

## 2018-08-27 NOTE — Telephone Encounter (Signed)
Copied from [place] [id number]. Topic: Appointment Scheduling - Transfer of Care >> Aug 27, 2018 10:46 AM Margot Ables wrote: Pt is requesting to transfer FROM: Dr. Cristie Hem Plotnikov Pt is requesting to transfer TO: Dr. Colin Benton Reason for requested transfer: pt states [facility] is closer to her home and that a neighbor recommended Dr. Maudie Mercury, neighbor/current pt name is Idelle Leech   Ms. Gohr states ok to leave detailed voicemail if no answer.  Send CRM to patient's current PCP (transferring FROM).  Dr.Plotnikov are you okay with this?

## 2018-08-29 ENCOUNTER — Other Ambulatory Visit (HOSPITAL_COMMUNITY): Payer: Self-pay | Admitting: Psychiatry

## 2018-08-30 NOTE — Telephone Encounter (Signed)
Dr. Maudie Mercury - please advise on pt's request to transfer care. Thank you!

## 2018-08-30 NOTE — Telephone Encounter (Signed)
Sending to [facility] to get the OKAY and schedule appointment.

## 2018-08-30 NOTE — Telephone Encounter (Signed)
Ok with me if she is ok with the following: -I am part time - here mondays, tuesdays, thursdays -I do not prescribe controlled meds such as clonazapam -needs 57min transfer visit

## 2018-08-31 ENCOUNTER — Other Ambulatory Visit (HOSPITAL_COMMUNITY): Payer: Self-pay

## 2018-08-31 MED ORDER — FLUOXETINE HCL 20 MG PO CAPS: 20.0000 mg | ORAL_CAPSULE | Freq: Every day | ORAL | 0 refills | Status: DC

## 2018-08-31 NOTE — Telephone Encounter (Signed)
Called pt to advise of Dr. Julianne Rice message. If pt is ok with all of this she may schedule TOC appt at her convenience.  LMTCB

## 2018-08-31 NOTE — Telephone Encounter (Signed)
Patient returned called, relayed Dr. Julianne Rice message- Patient okay with all. Scheduled patient TOC for 12/3.

## 2018-09-01 ENCOUNTER — Other Ambulatory Visit: Payer: Self-pay | Admitting: Internal Medicine

## 2018-09-01 NOTE — Telephone Encounter (Signed)
I also canceled the appointment with Dr.Plotnikov on 12/11

## 2018-09-01 NOTE — Telephone Encounter (Signed)
[place] Controlled Substance Database checked. Last filled on 07/27/2018  Last OV 06/23/2018

## 2018-09-02 DIAGNOSIS — H35373 Puckering of macula, bilateral: Secondary | ICD-10-CM | POA: Diagnosis not present

## 2018-09-02 DIAGNOSIS — H40013 Open angle with borderline findings, low risk, bilateral: Secondary | ICD-10-CM | POA: Diagnosis not present

## 2018-09-02 DIAGNOSIS — H16223 Keratoconjunctivitis sicca, not specified as Sjogren's, bilateral: Secondary | ICD-10-CM | POA: Diagnosis not present

## 2018-09-02 DIAGNOSIS — H43811 Vitreous degeneration, right eye: Secondary | ICD-10-CM | POA: Diagnosis not present

## 2018-09-14 ENCOUNTER — Encounter: Payer: Self-pay | Admitting: Family Medicine

## 2018-09-14 ENCOUNTER — Ambulatory Visit (INDEPENDENT_AMBULATORY_CARE_PROVIDER_SITE_OTHER): Payer: Medicare HMO | Admitting: Family Medicine

## 2018-09-14 VITALS — BP 118/72 | HR 66 | Temp 97.7°F | Ht 61.0 in | Wt 127.1 lb

## 2018-09-14 DIAGNOSIS — M797 Fibromyalgia: Secondary | ICD-10-CM | POA: Diagnosis not present

## 2018-09-14 DIAGNOSIS — R51 Headache: Secondary | ICD-10-CM

## 2018-09-14 DIAGNOSIS — K5901 Slow transit constipation: Secondary | ICD-10-CM

## 2018-09-14 DIAGNOSIS — F329 Major depressive disorder, single episode, unspecified: Secondary | ICD-10-CM

## 2018-09-14 DIAGNOSIS — E785 Hyperlipidemia, unspecified: Secondary | ICD-10-CM | POA: Diagnosis not present

## 2018-09-14 DIAGNOSIS — R519 Headache, unspecified: Secondary | ICD-10-CM

## 2018-09-14 DIAGNOSIS — G20C Parkinsonism, unspecified: Secondary | ICD-10-CM

## 2018-09-14 DIAGNOSIS — F419 Anxiety disorder, unspecified: Secondary | ICD-10-CM

## 2018-09-14 DIAGNOSIS — G20 Parkinson's disease: Secondary | ICD-10-CM | POA: Diagnosis not present

## 2018-09-14 DIAGNOSIS — R69 Illness, unspecified: Secondary | ICD-10-CM | POA: Diagnosis not present

## 2018-09-14 NOTE — Progress Notes (Signed)
HPI:  Shannon Sutton is here to establish care.   Has the following chronic problems that require follow up and concerns today:  Parkinsonism/migraines -Gait disturbance/dizziness/tremors/headaches/photophobia -for many years - reports finally current neurologist has diagnosed her with parkinsons which she has felt she likely had for some time -seeing neurology for management - seen recently with change in medication -associated symptoms: fatigue, difficulty swallowing, pain all over, confusion, difficulty with handwriting, photoensitivity, blurred vision, hx migraines -has had extensive lab evaluations, eval with optho and neuro and cardiology - has had echo stress tes and carotid US in the past, saw cardiology this year -neuro is doing trial of ropinirole, advised tx for dep and anxiety (she is seeing psychiatry) as well and PT, good nutrition and the are considering migraine medication and carb/levo -takes a number of supplements -has a number of listed medication intolerances  Chronic constipation: -sees gi for hx polyps -PCP prescribed linzess which is the only thing that has workd for her symptoms, tolerates well  Depression/Anxiety/Insomnia: -seeing psychiatry, Dr. Casimiro Needle -meds: prozac, trazadone, clonazepam  Hyperlipidemia: -low dose crestor on list - not really taking on a regular basis  ROS negative for unless reported above  Past Medical History:  Diagnosis Date  . Adenomatous colon polyp 1990  . Allergy   . Anxiety 12/15/2013  . Arthritis   . Cataract   . Chronic constipation   . Depression 11/16/2012  . Diverticulitis   . Diverticulosis   . Eosinophilic fasciitis 06/19/6733  . 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  Dr Trudie Reed 2009 had a bx/MRI: was on MTX and Prednisone  . External hemorrhoids   . Fibromyalgia with myofascial pain 11/16/2012  . Headaches, cluster   . Hypercholesterolemia 12/15/2013  . Hypertension   . Insomnia 11/16/2012  . Myalgia and  myositis, unspecified   . Other disorder of muscle, ligament, and fascia    eosinophilic fascitis  . Other specified disease of hair and hair follicles   . Pain in joint, multiple sites   . Restless leg syndrome 12/15/2013  . Unspecified sleep apnea   . Urine incontinence     Past Surgical History:  Procedure Laterality Date  . ABDOMINOPLASTY  2006   "tummy tuck"  . BREAST REDUCTION SURGERY  1995  . BREAST SURGERY  1981   left breast biopsy  . CATARACT EXTRACTION, BILATERAL    . MUSCLE BIOPSY  2009   DEEP TISSUE  . REDUCTION MAMMAPLASTY    . REFRACTIVE SURGERY    . TONSILLECTOMY    . TOTAL ABDOMINAL HYSTERECTOMY  1999  . WISDOM TOOTH EXTRACTION      Family History  Problem Relation Age of Onset  . Colon cancer Father 89  . Heart disease Father   . Colon polyps Mother   . Heart disease Mother   . Heart attack Mother   . COPD Sister   . Emphysema Sister   . COPD Brother   . Emphysema Brother   . Lung cancer Brother   . Breast cancer Paternal Grandmother   . Heart disease Other        family history  . Parkinson's disease Other   . Heart disease Paternal Grandfather   . Colon polyps Paternal Grandfather   . Stomach cancer Neg Hx   . Rectal cancer Neg Hx   . Esophageal cancer Neg Hx     Social History   Socioeconomic History  . Marital status: Divorced    Spouse name: Not on file  .  Number of children: 2  . Years of education: Not on file  . Highest education level: Not on file  Occupational History  . Occupation: Retired    Fish farm manager: RETIRED    CommentAstronomer  Social Needs  . Financial resource strain: Not on file  . Food insecurity:    Worry: Not on file    Inability: Not on file  . Transportation needs:    Medical: Not on file    Non-medical: Not on file  Tobacco Use  . Smoking status: Former Smoker    Last attempt to quit: 12/19/1986    Years since quitting: 31.7  . Smokeless tobacco: Never Used  Substance and Sexual Activity  .  Alcohol use: Yes    Alcohol/week: 1.0 standard drinks    Types: 1 Glasses of wine per week    Comment: 2-3 x year  . Drug use: No  . Sexual activity: Yes    Birth control/protection: Surgical, Post-menopausal  Lifestyle  . Physical activity:    Days per week: Not on file    Minutes per session: Not on file  . Stress: Not on file  Relationships  . Social connections:    Talks on phone: Not on file    Gets together: Not on file    Attends religious service: Not on file    Active member of club or organization: Not on file    Attends meetings of clubs or organizations: Not on file    Relationship status: Not on file  Other Topics Concern  . Not on file  Social History Narrative   Lives alone.  Retired.     Education- 2 years of college   2 daughters   caffeine daily 2-3 cups     Current Outpatient Medications:  .  B Complex-C (SUPER B COMPLEX) TABS, Take 1 tablet by mouth daily. , Disp: , Rfl:  .  Calcium Citrate-Vitamin D (EQ CALCIUM CITRATE+D3 PO), , Disp: , Rfl:  .  clonazePAM (KLONOPIN) 0.5 MG tablet, Take 1-2 tablets (0.5-1 mg total) by mouth at bedtime as needed., Disp: 60 tablet, Rfl: 2 .  Coenzyme Q10 (COQ-10) 100 MG CAPS, Take 1 tablet by mouth daily. , Disp: , Rfl:  .  cyclobenzaprine (FLEXERIL) 5 MG tablet, Take 1 tablet (5 mg total) by mouth 2 (two) times daily as needed for muscle spasms., Disp: 30 tablet, Rfl: 2 .  DHEA 50 MG TABS, , Disp: , Rfl:  .  FLUoxetine (PROZAC) 20 MG capsule, Take 1 capsule (20 mg total) by mouth daily., Disp: 90 capsule, Rfl: 0 .  folic acid (FOLVITE) 858 MCG tablet, Take 400 mcg by mouth daily., Disp: , Rfl:  .  LINZESS 290 MCG CAPS capsule, TAKE 1 CAPSULE BY MOUTH ONCE DAILY, Disp: 90 capsule, Rfl: 3 .  magnesium 30 MG tablet, Take 30 mg by mouth 2 (two) times daily., Disp: , Rfl:  .  Melatonin 5 MG CAPS, Take 5 mg by mouth as needed. , Disp: , Rfl:  .  Multiple Vitamins-Minerals (MULTIVITAMIN WITH MINERALS) tablet, Take 1 tablet by  mouth daily.  , Disp: , Rfl:  .  rOPINIRole (REQUIP) 0.25 MG tablet, Take 1-2 tablets (0.25-0.5 mg total) by mouth 2 (two) times daily., Disp: 120 tablet, Rfl: 6 .  rosuvastatin (CRESTOR) 5 MG tablet, Take 1 tablet (5 mg total) by mouth daily., Disp: 30 tablet, Rfl: 11 .  traZODone (DESYREL) 50 MG tablet, TAKE ONE TABLET BY MOUTH AT BEDTIME, Disp:  90 tablet, Rfl: 3 .  TURMERIC CURCUMIN PO, , Disp: , Rfl:  .  vitamin C (ASCORBIC ACID) 500 MG tablet, , Disp: , Rfl:   Current Facility-Administered Medications:  .  0.9 %  sodium chloride infusion, 500 mL, Intravenous, Continuous, Danis, Estill Cotta III, MD  EXAM:  Vitals:   09/14/18 1446  BP: 118/72  Pulse: 66  Temp: 97.7 F (36.5 C)    Body mass index is 24.02 kg/m.  GENERAL: vitals reviewed and listed above, alert, oriented, appears well hydrated and in no acute distress  HEENT: atraumatic, conjunttiva clear, no obvious abnormalities on inspection of external nose and ears  NECK: no obvious masses on inspection  LUNGS: clear to auscultation bilaterally, no wheezes, rales or rhonchi, good air movement  CV: HRRR, no peripheral edema  MS: moves all extremities without noticeable abnormality  PSYCH: pleasant and cooperative, no obvious depression or anxiety  ASSESSMENT AND PLAN:  Discussed the following assessment and plan: More than 50% of over 40 minutes spent in total in caring for this patient was spent face-to-face with the patient, counseling and/or coordinating care.    CALLEIGH LAFONTANT is a very pleasant 72 yo with a complex symptomology, seeing several specialist. We will defer to her current specialists formanagement of these issues. But, did discuss a few things she may want to try/consider  Including gi eval if any persistent dysphagia, migraine treatment, imaging neck, trial off of her supplements (except vit D3 in normal dosing) and recheck of lipids on low dose crestor and off. Also, recommend a healthy diet (such as  Med[place], regular exercise - as she is doing and management stress (as she is also doing with psychiatry/consideration CBT to support the body. Follow up 4-6 months per her preferrence, sooner as needed.  Slow transit constipation -continue linzess as she reports this work well without intolerance  Dyslipidemia -trial on crestor and check fasting lipids next visit  Parkinsonism, unspecified Parkinsonism type ([place]) -seeing neurology for management  Fibromyalgia with myofascial pain -seeing neurology and psychiatry, on a number of medications  Anxiety Reactive depression -seeing psychiatry for managment  Generalized headaches -seeing neurology, suggested she do consider trial tx migraines, discussed neck imaging (MRA/CTA or [place]) given migraines/dizziness/visual complaints. She plans to discuss this with her neurologist and consider.  -We reviewed the PMH, PSH, FH, SH, Meds and Allergies. -We provided refills for any medications we will prescribe as needed (linzess and crestor), other medications per her specialists. -We addressed current concerns per orders and patient instructions.   There are no Patient Instructions on file for this visit.   Lucretia Kern

## 2018-09-15 ENCOUNTER — Encounter: Payer: Self-pay | Admitting: Family Medicine

## 2018-09-21 DIAGNOSIS — Z0100 Encounter for examination of eyes and vision without abnormal findings: Secondary | ICD-10-CM | POA: Diagnosis not present

## 2018-09-22 ENCOUNTER — Ambulatory Visit: Payer: Self-pay | Admitting: Internal Medicine

## 2018-10-20 ENCOUNTER — Ambulatory Visit (HOSPITAL_COMMUNITY): Payer: Medicare HMO | Admitting: Psychiatry

## 2018-10-20 ENCOUNTER — Encounter (HOSPITAL_COMMUNITY): Payer: Self-pay | Admitting: Psychiatry

## 2018-10-20 VITALS — BP 130/75 | HR 82 | Ht 61.0 in | Wt 128.0 lb

## 2018-10-20 DIAGNOSIS — F3342 Major depressive disorder, recurrent, in full remission: Secondary | ICD-10-CM | POA: Diagnosis not present

## 2018-10-20 DIAGNOSIS — R69 Illness, unspecified: Secondary | ICD-10-CM | POA: Diagnosis not present

## 2018-10-20 MED ORDER — TRAZODONE HCL 50 MG PO TABS: 50.0000 mg | ORAL_TABLET | Freq: Every day | ORAL | 3 refills | Status: DC

## 2018-10-20 MED ORDER — FLUOXETINE HCL 20 MG PO CAPS: 20.0000 mg | ORAL_CAPSULE | Freq: Every day | ORAL | 0 refills | Status: DC

## 2018-10-20 MED ORDER — CLONAZEPAM 0.5 MG PO TABS: ORAL_TABLET | ORAL | 4 refills | Status: DC

## 2018-10-20 NOTE — Progress Notes (Signed)
Psychiatric Initial Adult Assessment   Patient Identification: Shannon Sutton MRN:  [id number] Date of Evaluation:  10/20/2018 Referral Source Dr. Alain Marion Chief Complaint:   Visit Diagnosis major depression single episode in remission  History of Present Illness  Today the patient is seen for symptoms of mild depression.  This patient has been on antidepressants for over 40 years.  Is not clear how beneficial they actually are.  The patient takes Prozac on a regular basis.  The biggest thing in her life is the new diagnosis of Parkinson's.  She has some dizziness some problems using her hands.  This makes her depressed.  Socially the patient is very stable.  She has a female friend that she is been with for a long time.  Patient starting to go to the gym.  Is able to enjoy the television but does not read very much because of dry eyes.  She has a dog.  Overall the patient is enjoying life.  At this time she shows no evidence of significant depression disorder.  She shows some mild dysphoria and she admits to being emotional where she will cry on an erratic basis.  But does not seem to be persistent.  She sleeps and eats well has good energy and has no problems thinking concentrating.  She is a good sense of work.  Is not suicidal.  She is never been in therapy.  Patient is changed her primary care physician.  Overall patient is doing well.  She is trying to stop her antidepressant.  She is been on it for very long time although she was on a lot of different antidepressants in the distant past.  Prozac is served her well.  Associated Signs/Symptoms: Depression Symptoms:  depressed mood, (Hypo) Manic Symptoms:   Anxiety Symptoms:   Psychotic Symptoms:   PTSD Symptoms:   Past Psychiatric History: Prozac, Effexor,Vybrid  Previous Psychotropic Medications: Yes   Substance Abuse History in the last 12 months:  No.  Consequences of Substance Abuse:   Past Medical History:  Past Medical History:   Diagnosis Date  . Adenomatous colon polyp 1990  . Allergy   . Anxiety 12/15/2013  . Arthritis   . Cataract   . Chronic constipation   . Depression 11/16/2012  . Diverticulitis   . Diverticulosis   . 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 External hemorrhoids   . Fibromyalgia with myofascial pain 11/16/2012  . Headaches, cluster   . Hypercholesterolemia 12/15/2013  . Hypertension   . Insomnia 11/16/2012  . Myalgia and myositis, unspecified   . Other disorder of muscle, ligament, and fascia    eosinophilic fascitis  . Other specified disease of hair and hair follicles   . Pain in joint, multiple sites   . Restless leg syndrome 12/15/2013  . Unspecified sleep apnea   . Urine incontinence     Past Surgical History:  Procedure Laterality Date  . ABDOMINOPLASTY  2006   "tummy tuck"  . BREAST REDUCTION SURGERY  1995  . BREAST SURGERY  1981   left breast biopsy  . CATARACT EXTRACTION, BILATERAL    . MUSCLE BIOPSY  2009   DEEP TISSUE  . REDUCTION MAMMAPLASTY    . REFRACTIVE SURGERY    . TONSILLECTOMY    . TOTAL ABDOMINAL HYSTERECTOMY  1999  . WISDOM TOOTH EXTRACTION      Family Psychiatric History:   Family History:  Family History  Problem Relation Age of Onset  . Colon cancer Father 79  . Heart disease Father   . Colon polyps Mother   . Heart disease Mother   . Heart attack Mother   . COPD Sister   . Emphysema Sister   . COPD Brother   . Emphysema Brother   . Lung cancer Brother   . Breast cancer Paternal Grandmother   . Heart disease Other        family history  . Parkinson's disease Other   . Heart disease Paternal Grandfather   . Colon polyps Paternal Grandfather   . Stomach cancer Neg Hx   . Rectal cancer Neg Hx   . Esophageal cancer Neg Hx     Social History:   Social History   Socioeconomic History  . Marital status: Divorced    Spouse name: Not on file  . Number of children: 2  . Years  of education: Not on file  . Highest education level: Not on file  Occupational History  . Occupation: Retired    Fish farm manager: RETIRED    CommentAstronomer  Social Needs  . Financial resource strain: Not on file  . Food insecurity:    Worry: Not on file    Inability: Not on file  . Transportation needs:    Medical: Not on file    Non-medical: Not on file  Tobacco Use  . Smoking status: Former Smoker    Last attempt to quit: 12/19/1986    Years since quitting: 31.8  . Smokeless tobacco: Never Used  Substance and Sexual Activity  . Alcohol use: Yes    Alcohol/week: 1.0 standard drinks    Types: 1 Glasses of wine per week    Comment: 2-3 x year  . Drug use: No  . Sexual activity: Yes    Birth control/protection: Surgical, Post-menopausal  Lifestyle  . Physical activity:    Days per week: Not on file    Minutes per session: Not on file  . Stress: Not on file  Relationships  . Social connections:    Talks on phone: Not on file    Gets together: Not on file    Attends religious service: Not on file    Active member of club or organization: Not on file    Attends meetings of clubs or organizations: Not on file    Relationship status: Not on file  Other Topics Concern  . Not on file  Social History Narrative   Lives alone.  Retired.     Education- 2 years of college   2 daughters   caffeine daily 2-3 cups    Additional Social History:   Allergies:   Allergies  Allergen Reactions  . Crestor [Rosuvastatin Calcium]     abd pain  . Cymbalta [Duloxetine Hcl]     Nausea   . Effexor Xr [Venlafaxine Hcl Er]     dizziness  . Morphine And Related Itching    agitation  . Pravastatin     abd pain  . Promethazine-Codeine Itching    Itching in face    Metabolic Disorder Labs: No results found for: HGBA1C, MPG No results found for: PROLACTIN Lab Results  Component Value Date   CHOL 204 (H) 03/18/2018   TRIG 57.0 03/18/2018   HDL 69.20 03/18/2018   CHOLHDL  3 03/18/2018   VLDL 11.4 03/18/2018   LDLCALC 123 (H) 03/18/2018   LDLCALC 87 08/17/2017     Current Medications: Current Outpatient Medications  Medication Sig Dispense Refill  . B Complex-C (SUPER B COMPLEX) TABS Take 1 tablet by mouth daily.     . Calcium Citrate-Vitamin D (EQ CALCIUM CITRATE+D3 PO)     . clonazePAM (KLONOPIN) 0.5 MG tablet 1  qhs 30 tablet 4  . Coenzyme Q10 (COQ-10) 100 MG CAPS Take 1 tablet by mouth daily.     . cyclobenzaprine (FLEXERIL) 5 MG tablet Take 1 tablet (5 mg total) by mouth 2 (two) times daily as needed for muscle spasms. 30 tablet 2  . DHEA 50 MG TABS     . FLUoxetine (PROZAC) 20 MG capsule Take 1 capsule (20 mg total) by mouth daily. 90 capsule 0  . folic acid (FOLVITE) 283 MCG tablet Take 400 mcg by mouth daily.    Marland Kitchen LINZESS 290 MCG CAPS capsule TAKE 1 CAPSULE BY MOUTH ONCE DAILY 90 capsule 3  . magnesium 30 MG tablet Take 30 mg by mouth 2 (two) times daily.    . Melatonin 5 MG CAPS Take 5 mg by mouth as needed.     . Multiple Vitamins-Minerals (MULTIVITAMIN WITH MINERALS) tablet Take 1 tablet by mouth daily.      Marland Kitchen rOPINIRole (REQUIP) 0.25 MG tablet Take 1-2 tablets (0.25-0.5 mg total) by mouth 2 (two) times daily. 120 tablet 6  . traZODone (DESYREL) 50 MG tablet Take 1 tablet (50 mg total) by mouth at bedtime. 90 tablet 3  . TURMERIC CURCUMIN PO     . vitamin C (ASCORBIC ACID) 500 MG tablet     . rosuvastatin (CRESTOR) 5 MG tablet Take 1 tablet (5 mg total) by mouth daily. (Patient not taking: Reported on 10/20/2018) 30 tablet 11   Current Facility-Administered Medications  Medication Dose Route Frequency Provider Last Rate Last Dose  . 0.9 %  sodium chloride infusion  500 mL Intravenous Continuous Danis, Estill Cotta III, MD        Neurologic: Headache: No Seizure: No Paresthesias:No  Musculoskeletal: Strength & Muscle Tone: abnormal Gait & Station: unsteady Patient leans: N/A  Psychiatric Specialty Exam: ROS  Blood pressure 130/75, pulse  82, height 5\' 1"  (1.549 m), weight 128 lb (58.1 kg), SpO2 100 %.Body mass index is 24.19 kg/m.  General Appearance: Fairly Groomed  Eye Contact:  Good  Speech:  Clear and Coherent  Volume:  Normal  Mood:  Depressed  Affect:  Appropriate  Thought Process:  Goal Directed  Orientation:  Full (Time, Place, and Person)  Thought Content:  WDL  Suicidal Thoughts:  No  Homicidal Thoughts:  No  Memory:  NA  Judgement:  Good  Insight:  Fair  Psychomotor Activity:  Normal  Concentration:    Recall:  [place] of Knowledge:Good  Language: Fair  Akathisia:  No  Handed:  Right  AIMS (if indicated):    Assets:  Desire for Improvement  ADL's:  Intact  Cognition: WNL  Sleep:      Treatment Plan Summary: This patient's first and only problem is mild refractory depression.  She does appear to she cries but she does not feel lonely nor does she feels bored.  Nor she anhedonic.  Sore #1 only problem is that of mild dysphoria.  I believe the Prozac has been helpful.  She also takes Klonopin 0.5 mg to help her sleep which makes perfect sense.  He takes a small dose of trazodone also.  Both the Klonopin and trazodone are reasonable things for this patient in the treatment of her depression.  So in  essence her treatment includes Prozac, Klonopin and trazodone.  I think she is very stable.  For now I will continue all his medications and have her return to see me in 5 months.  She certainly is not suicidal.  Besides parkinsonian symptoms which are very minimal the patient has no chest pain shortness of breath or any cardiovascular complaints.  He has no other neurological complaints.  Jerral Ralph, MD 1/8/20203:41 PM

## 2018-10-26 NOTE — Telephone Encounter (Signed)
Set up follow up appt with me. -VRP

## 2018-10-27 NOTE — Telephone Encounter (Signed)
I called pt.  She stated that she is taking ropinirole 1 tabs am and 2 tabs pm.  She is having blurriness, headaches, hands are like ping pong paddles.  Not sure what is causing this.  Made appt to discuss 11-01-18 at 1300. Pt verbalized understanding.

## 2018-11-01 ENCOUNTER — Ambulatory Visit: Payer: Medicare HMO | Admitting: Diagnostic Neuroimaging

## 2018-11-01 ENCOUNTER — Encounter: Payer: Self-pay | Admitting: Diagnostic Neuroimaging

## 2018-11-01 ENCOUNTER — Telehealth: Payer: Self-pay | Admitting: Diagnostic Neuroimaging

## 2018-11-01 VITALS — BP 128/75 | HR 77 | Ht 61.0 in | Wt 127.0 lb

## 2018-11-01 DIAGNOSIS — G43109 Migraine with aura, not intractable, without status migrainosus: Secondary | ICD-10-CM

## 2018-11-01 DIAGNOSIS — R531 Weakness: Secondary | ICD-10-CM | POA: Diagnosis not present

## 2018-11-01 DIAGNOSIS — R269 Unspecified abnormalities of gait and mobility: Secondary | ICD-10-CM

## 2018-11-01 DIAGNOSIS — R251 Tremor, unspecified: Secondary | ICD-10-CM

## 2018-11-01 MED ORDER — RIZATRIPTAN BENZOATE 10 MG PO TBDP: 10.0000 mg | ORAL_TABLET | ORAL | 11 refills | Status: DC | PRN

## 2018-11-01 MED ORDER — TOPIRAMATE 50 MG PO TABS: 50.0000 mg | ORAL_TABLET | Freq: Two times a day (BID) | ORAL | 12 refills | Status: DC

## 2018-11-01 NOTE — Patient Instructions (Signed)
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

## 2018-11-01 NOTE — Progress Notes (Signed)
[facility]  PATIENT: Shannon Sutton DOB: 1945/10/28  REFERRING CLINICIAN: Plotnikov, A HISTORY FROM: patient and daughter  REASON FOR VISIT: follow up    HISTORICAL  CHIEF COMPLAINT:  Chief Complaint  Patient presents with  . Tremors    rm 7, "discuss symptoms- blurry vision, ongoing dizziness, headaches"   . Follow-up    HISTORY OF PRESENT ILLNESS:   UPDATE (11/01/2018, VRP): She continues to have blurred vision, headaches and tremor of hands. Blurred vision is worse with bright lights. She is having right sided temporal and occipital headaches. They are throbbing with photosensitivity and nausea. She kept BP log and readings were normal. No migraine diary. Resting tremor is worse in the evening. L> R hand. PT in 08/2018 did not help much. She is going to GYM 3 times a week, both cardio and light weights. She continues to [employer] about being forgetful. She is constantly hoarse and has intermittent trouble swallowing saliva. No trouble swallowing food or liquids. No falls. She is taking ropinirole 0.25mg  in the am and 0.50 in the pm. She is tolerating well. She did not increase dose due to dizziness. She feels that symptoms are no worse since last visit in 08/2018 but no better.   UPDATE (08/27/18, VRP): Since last visit, symptoms are progressing.  Now patient having more problems with lightheadedness, dizziness, gait and balance difficulty, tremor, fatigue.  Also with more confusion, difficulty swallowing, swallowing difficulty hoarse voice, difficulty with handwriting.  Also reports increasing photosensitivity and blurred vision.  She does have history of headaches since age 48 years old with right temporal throbbing painful sensation associate with photophobia and nausea.  PRIOR HPI (12/02/17): 73 year old right-handed female here for evaluation of tremor, weakness, blurred vision, headaches, fatigue.  Evaluate for Parkinson symptoms.  Patient reports intermittent  weakness and shaky sensation with heart racing feeling since February 2018.  Patient also having some intermittent tremor, mainly with certain postures and actions, mainly in her left hand, dating back to January 2017.  She had evaluation with movement disorder specialist Dr. Arbutus Leas in 2017, who felt that patient did not have Parkinson's disease at that time.  Long history of loss of smell.  Patient feels like her handwriting is worsening.  Patient also concerned about her family history of Parkinson's disease in her paternal grandfather and maternal aunt.  Patient also having issues with depression, confusion, memory loss, balance problems.  Patient has intermittent headaches with pressure sensation.  No nausea or vomiting.  No photophobia or phonophobia.  Patient has been struggling with her vision and has gone through "$2000 worth" of different glasses and eye exams without benefit in her vision.    REVIEW OF SYSTEMS: Full 14 system review of systems performed and negative with exception of: Sleepiness restless legs confusion headache weakness dizziness depression anxiety skin sensitivity cramps aching muscles going cold fatigue palpitations hearing loss blurred vision loss of vision eye pain easy bruising trouble swallowing.   ALLERGIES: Allergies  Allergen Reactions  . Crestor [Rosuvastatin Calcium]     abd pain  . Cymbalta [Duloxetine Hcl]     Nausea   . Effexor Xr [Venlafaxine Hcl Er]     dizziness  . Morphine And Related Itching    agitation  . Pravastatin     abd pain  . Promethazine-Codeine Itching    Itching in face    HOME MEDICATIONS: Outpatient Medications Prior to Visit  Medication Sig Dispense Refill  . B Complex-C (SUPER B COMPLEX) TABS Take  1 tablet by mouth daily.     . Calcium Citrate-Vitamin D (EQ CALCIUM CITRATE+D3 PO)     . clonazePAM (KLONOPIN) 0.5 MG tablet 1  qhs 30 tablet 4  . Coenzyme Q10 (COQ-10) 100 MG CAPS Take 1 tablet by mouth daily.     .  cyclobenzaprine (FLEXERIL) 5 MG tablet Take 1 tablet (5 mg total) by mouth 2 (two) times daily as needed for muscle spasms. 30 tablet 2  . DHEA 50 MG TABS     . FLUoxetine (PROZAC) 20 MG capsule Take 1 capsule (20 mg total) by mouth daily. 90 capsule 0  . folic acid (FOLVITE) 800 MCG tablet Take 400 mcg by mouth daily.    Marland Kitchen LINZESS 290 MCG CAPS capsule TAKE 1 CAPSULE BY MOUTH ONCE DAILY 90 capsule 3  . magnesium 30 MG tablet Take 30 mg by mouth 2 (two) times daily.    . Melatonin 5 MG CAPS Take 5 mg by mouth as needed.     . Multiple Vitamins-Minerals (MULTIVITAMIN WITH MINERALS) tablet Take 1 tablet by mouth daily.      Marland Kitchen rOPINIRole (REQUIP) 0.25 MG tablet Take 1-2 tablets (0.25-0.5 mg total) by mouth 2 (two) times daily. 120 tablet 6  . traZODone (DESYREL) 50 MG tablet Take 1 tablet (50 mg total) by mouth at bedtime. 90 tablet 3  . TURMERIC CURCUMIN PO     . vitamin C (ASCORBIC ACID) 500 MG tablet     . rosuvastatin (CRESTOR) 5 MG tablet Take 1 tablet (5 mg total) by mouth daily. (Patient not taking: Reported on 10/20/2018) 30 tablet 11   Facility-Administered Medications Prior to Visit  Medication Dose Route Frequency Provider Last Rate Last Dose  . 0.9 %  sodium chloride infusion  500 mL Intravenous Continuous Sherrilyn Rist, MD        PAST MEDICAL HISTORY: Past Medical History:  Diagnosis Date  . Adenomatous colon polyp 1990  . Allergy   . Anxiety 12/15/2013  . Arthritis   . Cataract   . Chronic constipation   . Depression 11/16/2012  . Diverticulitis   . Diverticulosis   . 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 External hemorrhoids   . Fibromyalgia with myofascial pain 11/16/2012  . Headaches, cluster   . Hypercholesterolemia 12/15/2013  . Hypertension   . Insomnia 11/16/2012  . Myalgia and myositis, unspecified   . Other disorder of muscle, ligament, and fascia    eosinophilic fascitis  . Other specified disease  of hair and hair follicles   . Pain in joint, multiple sites   . Restless leg syndrome 12/15/2013  . Unspecified sleep apnea   . Urine incontinence     PAST SURGICAL HISTORY: Past Surgical History:  Procedure Laterality Date  . ABDOMINOPLASTY  2006   "tummy tuck"  . BREAST REDUCTION SURGERY  1995  . BREAST SURGERY  1981   left breast biopsy  . CATARACT EXTRACTION, BILATERAL    . MUSCLE BIOPSY  2009   DEEP TISSUE  . REDUCTION MAMMAPLASTY    . REFRACTIVE SURGERY    . TONSILLECTOMY    . TOTAL ABDOMINAL HYSTERECTOMY  1999  . WISDOM TOOTH EXTRACTION      FAMILY HISTORY: Family History  Problem Relation Age of Onset  . Colon cancer Father 62  . Heart disease Father   . Colon polyps Mother   . Heart disease Mother   .  Heart attack Mother   . COPD Sister   . Emphysema Sister   . COPD Brother   . Emphysema Brother   . Lung cancer Brother   . Breast cancer Paternal Grandmother   . Heart disease Other        family history  . Parkinson's disease Other   . Heart disease Paternal Grandfather   . Colon polyps Paternal Grandfather   . Stomach cancer Neg Hx   . Rectal cancer Neg Hx   . Esophageal cancer Neg Hx     SOCIAL HISTORY:  Social History   Socioeconomic History  . Marital status: Divorced    Spouse name: Not on file  . Number of children: 2  . Years of education: Not on file  . Highest education level: Not on file  Occupational History  . Occupation: Retired    Associate Professor: RETIRED    CommentEquities trader  Social Needs  . Financial resource strain: Not on file  . Food insecurity:    Worry: Not on file    Inability: Not on file  . Transportation needs:    Medical: Not on file    Non-medical: Not on file  Tobacco Use  . Smoking status: Former Smoker    Last attempt to quit: 12/19/1986    Years since quitting: 31.8  . Smokeless tobacco: Never Used  Substance and Sexual Activity  . Alcohol use: Yes    Alcohol/week: 1.0 standard drinks    Types: 1  Glasses of wine per week    Comment: 2-3 x year  . Drug use: No  . Sexual activity: Yes    Birth control/protection: Surgical, Post-menopausal  Lifestyle  . Physical activity:    Days per week: Not on file    Minutes per session: Not on file  . Stress: Not on file  Relationships  . Social connections:    Talks on phone: Not on file    Gets together: Not on file    Attends religious service: Not on file    Active member of club or organization: Not on file    Attends meetings of clubs or organizations: Not on file    Relationship status: Not on file  . Intimate partner violence:    Fear of current or ex partner: Not on file    Emotionally abused: Not on file    Physically abused: Not on file    Forced sexual activity: Not on file  Other Topics Concern  . Not on file  Social History Narrative   Lives alone.  Retired.     Education- 2 years of college   2 daughters   caffeine daily 2-3 cups     PHYSICAL EXAM  GENERAL EXAM/CONSTITUTIONAL: Vitals:  Vitals:   11/01/18 1242  BP: 128/75  Pulse: 77  Weight: 127 lb (57.6 kg)  Height: 5\' 1"  (1.549 m)   No data found. Body mass index is 24 kg/m. No exam data present  Patient is in no distress; well developed, nourished and groomed; neck is supple  CARDIOVASCULAR:  Examination of carotid arteries is normal; no carotid bruits  Regular rate and rhythm, no murmurs  Examination of peripheral vascular system by observation and palpation is normal  EYES:  Ophthalmoscopic exam of optic discs and posterior segments is normal; no papilledema or hemorrhages  MUSCULOSKELETAL:  Gait, strength, tone, movements noted in Neurologic exam below  NEUROLOGIC: MENTAL STATUS:  No flowsheet data found.  awake, alert, oriented to person, place  and time  recent and remote memory intact  normal attention and concentration  language fluent, comprehension intact, naming intact,   fund of knowledge appropriate  CRANIAL NERVE:     2nd - no papilledema on fundoscopic exam  2nd, 3rd, 4th, 6th - pupils equal and reactive to light, visual fields full to confrontation, extraocular muscles intact, no nystagmus  5th - facial sensation symmetric  7th - facial strength symmetric  8th - hearing intact  9th - palate elevates symmetrically, uvula midline  11th - shoulder shrug symmetric  12th - tongue protrusion midline  MASKED FACIES  MILD HOARSE VOICE  MOTOR:   normal bulk and tone, full strength in the BUE, BLE  COGWHEELING IN LUE > RUE  MODERATE BRADYKINESIA IN BUE (LUE SLOWER THAN RIGHT) AND BLE  NO TREMOR  SENSORY:   normal and symmetric to light touch, temperature, vibration  COORDINATION:   finger-nose-finger, fine finger movements normal  REFLEXES:   deep tendon reflexes 2 and symmetric  GAIT/STATION:   narrow based gait; SLIGHTLY UNSTEADY; DECR ARM SWING    DIAGNOSTIC DATA (LABS, IMAGING, TESTING) - I reviewed patient records, labs, notes, testing and imaging myself where available.  Lab Results  Component Value Date   WBC 4.6 03/18/2018   HGB 13.9 03/18/2018   HCT 40.1 03/18/2018   MCV 91.5 03/18/2018   PLT 213.0 03/18/2018      Component Value Date/Time   NA 142 03/18/2018 0814   NA 140 09/19/2013   K 4.0 03/18/2018 0814   CL 106 03/18/2018 0814   CO2 27 03/18/2018 0814   GLUCOSE 97 03/18/2018 0814   BUN 13 03/18/2018 0814   BUN 11 09/19/2013   CREATININE 0.60 03/18/2018 0814   CALCIUM 9.4 03/18/2018 0814   PROT 6.9 03/18/2018 0814   ALBUMIN 4.3 03/18/2018 0814   AST 14 03/18/2018 0814   ALT 11 03/18/2018 0814   ALKPHOS 51 03/18/2018 0814   BILITOT 0.3 03/18/2018 0814   Lab Results  Component Value Date   CHOL 204 (H) 03/18/2018   HDL 69.20 03/18/2018   LDLCALC 123 (H) 03/18/2018   TRIG 57.0 03/18/2018   CHOLHDL 3 03/18/2018   No results found for: HGBA1C Lab Results  Component Value Date   VITAMINB12 837 05/29/2016   Lab Results  Component Value  Date   TSH 2.85 03/18/2018   07/11/13 MRI lumbar spine [I reviewed images myself and agree with interpretation. -VRP]  1. Grade 1 anterolisthesis at L4-5 measures 5 mm. 2. And mild left foraminal narrowing at L2-3. 3. Mild right lateral recess narrowib ng at L3-4. 4. Moderate right and mild left foraminal stenosis at L3-4. 5. Mild right lateral recess and foraminal stenosis at L4-5 secondary to a rightward disc herniation and facet hypertrophy. 6. Chronic loss of disc height at L5-S1 without significant stenosis.  04/08/17 MRI brain [I reviewed images myself and agree with interpretation. -VRP]  - The pituitary appears mildly atrophic, but there are no features suggestive of microadenoma, macroadenoma, or parasellar mass. - Moderate atrophy and small vessel disease. No hypothalamic abnormality. - No acute or focal intracranial findings. Normal postcontrast appearance of the brain.    ASSESSMENT AND PLAN  73 y.o. year old female here with constellation of symptoms including fatigue, tremor, anxiety, headaches, vision changes since 2017.    Dx:  1. Tremor   2. Generalized weakness   3. Gait difficulty   4. Migraine with aura and without status migrainosus, not intractable  PLAN:  PARKINSONISM (akinetic-rigid) - ORDER DATscan  - ropinirole 0/25 / 0.5mg  (could not tolerate higher dose)  - may consider carb/levo in future  - continue physical therapy for gait and balance training  - improve nutrition, depression and anxiety treatment  - monitor BP   MIGRAINE WITH AURA (+ chronic daily headaches; medication overuse headache) - start topiramate 50mg  at bedtime; after 1 week increase to twice a day; drink plenty of water  - rizatriptan 10mg  as needed for breakthrough headache; may repeat x 1 after 2 hours; max 2 tabs per day or 8 per month  - ibuprofen / tylenol as needed (limit to 5-10 doses per month)  Orders Placed This Encounter  Procedures  . NM TUMOR LOC  INFLAMMATION SPECT 1 AREA 1 DAY IMAGING BRAIN DATSCAN   Meds ordered this encounter  Medications  . topiramate (TOPAMAX) 50 MG tablet    Sig: Take 1 tablet (50 mg total) by mouth 2 (two) times daily.    Dispense:  60 tablet    Refill:  12  . rizatriptan (MAXALT-MLT) 10 MG disintegrating tablet    Sig: Take 1 tablet (10 mg total) by mouth as needed for migraine. May repeat in 2 hours if needed    Dispense:  9 tablet    Refill:  11   Return in about 4 months (around 03/02/2019).    Suanne Marker, MD 11/01/2018, 1:09 PM Certified in Neurology, Neurophysiology and Neuroimaging  [facility] [address] [place], [place] [address] [phone]

## 2018-11-01 NOTE — Telephone Encounter (Signed)
spoke to the patient she needs to sign the consent form first before it can be process. We will email her the form and she stated she will mail it back to [place]. Once I receive the form the authorization process can be started.

## 2018-11-05 ENCOUNTER — Other Ambulatory Visit: Payer: Self-pay | Admitting: Internal Medicine

## 2018-11-08 NOTE — Telephone Encounter (Signed)
I received the sign consent form via in the mail. I faxed the paper work for the prior authorization with [place].

## 2018-11-09 ENCOUNTER — Other Ambulatory Visit: Payer: Self-pay | Admitting: Internal Medicine

## 2018-11-09 ENCOUNTER — Encounter: Payer: Self-pay | Admitting: Family Medicine

## 2018-11-10 MED ORDER — CYCLOBENZAPRINE HCL 5 MG PO TABS: ORAL_TABLET | ORAL | 0 refills | Status: DC

## 2018-11-10 NOTE — Telephone Encounter (Signed)
Ok to refill x 1. Thanks.

## 2018-11-10 NOTE — Telephone Encounter (Signed)
Rx done. 

## 2018-11-15 NOTE — Telephone Encounter (Signed)
It is still pending

## 2018-11-17 ENCOUNTER — Telehealth: Payer: Self-pay | Admitting: *Deleted

## 2018-11-17 NOTE — Telephone Encounter (Signed)
Pt called she has taken rizatriptan odt for headache.  She has never taken before.  About an hour within taking medication she noted tingling, prickly sensation, flushing sensation, feeling like she is having palpitation, but her pulse was 79.  No chest pain, shortness of breath.  Has not helped headache, she feels more tension in back of head, neck. Bp 139/89 which is higher for her.  She relayed she feels no worse at this time, she will hydrate with water, and will not take this again.  She did not take topiramate this am, taking then aleve/back and muscle pain tabs prn (even though she know can cause overuse headaches).  I will check on her later this afternoon.

## 2018-11-17 NOTE — Telephone Encounter (Signed)
I called pt back.   She stated she is feeling better, has hydrated well.  Just feels tired, tingling sensation gone. Placed rizatriptan on her allergy list.  She will call back as needed.

## 2018-11-24 ENCOUNTER — Telehealth: Payer: Self-pay | Admitting: *Deleted

## 2018-11-24 NOTE — Telephone Encounter (Signed)
DAT scan denied, will go into appeal process. Dr Leta Baptist has signed papers. I called patient and advised she needs to sign papers as well. She verbalized understanding and stated she will come tomorrow to sign.

## 2018-11-25 NOTE — Telephone Encounter (Signed)
Faxed everything for the appeal process.

## 2018-11-25 NOTE — Telephone Encounter (Signed)
dat scan denied going into appeal process.

## 2018-11-25 NOTE — Telephone Encounter (Signed)
Patient came and signed appeal papers for DAT scan. Paper work given to [employer] to process appeal.

## 2018-11-30 NOTE — Telephone Encounter (Signed)
[place] medicare appeal approved [id number] (exp. 11/29/18 to 02/27/19)   I spoke to [place] that schedule the dat scans she states she will contact the patient to schedule.

## 2018-11-30 NOTE — Telephone Encounter (Signed)
[place] medicare appeal approved [id number] (exp. 11/29/18 to 02/27/19)   I spoke to [place] that schedule the dat scans she states she will contact the patient to schedule.

## 2018-12-30 ENCOUNTER — Other Ambulatory Visit: Payer: Self-pay

## 2018-12-30 ENCOUNTER — Encounter (HOSPITAL_COMMUNITY)
Admission: RE | Admit: 2018-12-30 | Discharge: 2018-12-30 | Disposition: A | Discharge status: Home / Self Care | Payer: Medicare HMO | Source: Ambulatory Visit | Attending: Diagnostic Neuroimaging | Admitting: Diagnostic Neuroimaging

## 2018-12-30 DIAGNOSIS — R251 Tremor, unspecified: Secondary | ICD-10-CM | POA: Diagnosis not present

## 2018-12-30 DIAGNOSIS — R531 Weakness: Secondary | ICD-10-CM | POA: Diagnosis not present

## 2018-12-30 DIAGNOSIS — R51 Headache: Secondary | ICD-10-CM | POA: Diagnosis not present

## 2018-12-30 MED ORDER — IODINE STRONG (LUGOLS) 5 % PO SOLN
0.8000 mL | Freq: Once | ORAL | Status: AC
  Administered 2018-12-30: 0.8 mL via ORAL

## 2018-12-30 MED ORDER — IOFLUPANE I 123 185 MBQ/2.5ML IV SOLN
4.8000 | Freq: Once | INTRAVENOUS | Status: AC
  Administered 2018-12-30: 4.8 via INTRAVENOUS

## 2019-01-04 ENCOUNTER — Telehealth: Payer: Self-pay | Admitting: *Deleted

## 2019-01-04 NOTE — Telephone Encounter (Signed)
Spoke with patient and informed her that Dr Randel Pigg reviewed DAT scan results. He stated there are subtle signs for parkinsonism. He advised she can consider carbidopa/levodopa. She may also consider a tele-visit.  She stated that if her requip could be increased to take care of her tremors, she'd like to do that. If not, she would like a trial of carb/levo.  If she begins carb/levo, she asked if she may still take requip 0.2 5mg  in am and 0.5 mg in pm. She declined tele visit at this time. I advised will discuss her questions with Dr Leta Baptist and let her know. Patient verbalized understanding, appreciation.

## 2019-01-04 NOTE — Telephone Encounter (Signed)
Spoke with patient and advised her of Dr [employer] advice. I advised she call for any questions, concerns. She verbalized understanding, appreciation.

## 2019-01-04 NOTE — Telephone Encounter (Signed)
Continue requip for now; monitor for next 3 months and then we can discuss further. -VRP

## 2019-01-11 ENCOUNTER — Ambulatory Visit: Payer: Medicare HMO | Admitting: Diagnostic Neuroimaging

## 2019-01-13 ENCOUNTER — Ambulatory Visit (INDEPENDENT_AMBULATORY_CARE_PROVIDER_SITE_OTHER): Payer: Medicare HMO | Admitting: Family Medicine

## 2019-01-13 ENCOUNTER — Telehealth: Payer: Self-pay | Admitting: *Deleted

## 2019-01-13 ENCOUNTER — Other Ambulatory Visit: Payer: Self-pay

## 2019-01-13 DIAGNOSIS — F329 Major depressive disorder, single episode, unspecified: Secondary | ICD-10-CM | POA: Diagnosis not present

## 2019-01-13 DIAGNOSIS — G20 Parkinson's disease: Secondary | ICD-10-CM | POA: Diagnosis not present

## 2019-01-13 DIAGNOSIS — M25511 Pain in right shoulder: Secondary | ICD-10-CM | POA: Diagnosis not present

## 2019-01-13 DIAGNOSIS — E785 Hyperlipidemia, unspecified: Secondary | ICD-10-CM

## 2019-01-13 DIAGNOSIS — R69 Illness, unspecified: Secondary | ICD-10-CM | POA: Diagnosis not present

## 2019-01-13 NOTE — Progress Notes (Signed)
Virtual Visit via Video Note  I connected with Shannon Sutton on 01/13/19 at 11:00 AM EDT by a video enabled telemedicine application and verified that I am speaking with the correct person using two identifiers. She was not able to utilize a video technology so this visit was successfully completed by telephone instead per her preference. She is avoiding non-essential office visits in light of COVID19 pandemic.  Location patient: home Location provider:work or home office Persons participating in the virtual visit: patient, provider  I discussed the limitations of evaluation and management by telemedicine and the availability of in person appointments. The patient expressed understanding and agreed to proceed.   HPI:  [name] is a pleasant 73 y.o. here for follow up. Chronic medical problems summarized below were reviewed for changes and stability and were updated as needed below. These issues and their treatment remain stable for the most part.  New issues of recurrent R shoulder pain. On and off for 2 months. Worse with abduction. Sometimes it is fine, then can be bad at times. No change inweakness, numbness, falls. 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 Denies CP, SOB, DOE, treatment intolerance or new symptoms.  Parkinsonism/migraines -dx with parkinsons -seeing neurology for management  -has had extensive lab evaluations, eval with optho and neuro and cardiology - has had echo stress tes and carotid US in the past, saw cardiology   Chronic constipation: -sees gi for hx polyps -PCP prescribed linzess in the past which is the only thing that has workd for her symptoms, tolerates well  Depression/Anxiety/Insomnia: -seeing psychiatry, Dr. Casimiro Needle -meds: prozac, trazadone, clonazepam  Hyperlipidemia: -on low dose crestor in the past -reports she tried it several times - ? Bloating with it  ROS: See pertinent positives and negatives per  HPI.  Past Medical History:  Diagnosis Date  . Adenomatous colon polyp 1990  . Allergy   . Anxiety 12/15/2013  . Arthritis   . Cataract   . Chronic constipation   . Depression 11/16/2012  . Diverticulitis   . Diverticulosis   . 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 External hemorrhoids   . Fibromyalgia with myofascial pain 11/16/2012  . Headaches, cluster   . Hypercholesterolemia 12/15/2013  . Hypertension   . Insomnia 11/16/2012  . Myalgia and myositis, unspecified   . Other disorder of muscle, ligament, and fascia    eosinophilic fascitis  . Other specified disease of hair and hair follicles   . Pain in joint, multiple sites   . Restless leg syndrome 12/15/2013  . Unspecified sleep apnea   . Urine incontinence     Past Surgical History:  Procedure Laterality Date  . ABDOMINOPLASTY  2006   "tummy tuck"  . BREAST REDUCTION SURGERY  1995  . BREAST SURGERY  1981   left breast biopsy  . CATARACT EXTRACTION, BILATERAL    . MUSCLE BIOPSY  2009   DEEP TISSUE  . REDUCTION MAMMAPLASTY    . REFRACTIVE SURGERY    . TONSILLECTOMY    . TOTAL ABDOMINAL HYSTERECTOMY  1999  . WISDOM TOOTH EXTRACTION      Family History  Problem Relation Age of Onset  . Colon cancer Father 73  . Heart disease Father   . Colon polyps Mother   . Heart disease Mother   . Heart attack Mother   . COPD Sister   . Emphysema  Sister   . COPD Brother   . Emphysema Brother   . Lung cancer Brother   . Breast cancer Paternal Grandmother   . Heart disease Other        family history  . Parkinson's disease Other   . Heart disease Paternal Grandfather   . Colon polyps Paternal Grandfather   . Stomach cancer Neg Hx   . Rectal cancer Neg Hx   . Esophageal cancer Neg Hx     SOCIAL HX: see hpi   Current Outpatient Medications:  .  B Complex-C (SUPER B COMPLEX) TABS, Take 1 tablet by mouth daily. , Disp: , Rfl:  .  Calcium Citrate-Vitamin D  (EQ CALCIUM CITRATE+D3 PO), , Disp: , Rfl:  .  clonazePAM (KLONOPIN) 0.5 MG tablet, 1  qhs, Disp: 30 tablet, Rfl: 4 .  Coenzyme Q10 (COQ-10) 100 MG CAPS, Take 1 tablet by mouth daily. , Disp: , Rfl:  .  cyclobenzaprine (FLEXERIL) 5 MG tablet, TAKE 1 TABLET BY MOUTH TWICE DAILY AS NEEDED FOR MUSCLE SPASM, Disp: 30 tablet, Rfl: 0 .  DHEA 50 MG TABS, , Disp: , Rfl:  .  FLUoxetine (PROZAC) 20 MG capsule, Take 1 capsule (20 mg total) by mouth daily., Disp: 90 capsule, Rfl: 0 .  folic acid (FOLVITE) 756 MCG tablet, Take 400 mcg by mouth daily., Disp: , Rfl:  .  LINZESS 290 MCG CAPS capsule, TAKE 1 CAPSULE BY MOUTH ONCE DAILY, Disp: 90 capsule, Rfl: 3 .  magnesium 30 MG tablet, Take 30 mg by mouth 2 (two) times daily., Disp: , Rfl:  .  Melatonin 5 MG CAPS, Take 5 mg by mouth as needed. , Disp: , Rfl:  .  Multiple Vitamins-Minerals (MULTIVITAMIN WITH MINERALS) tablet, Take 1 tablet by mouth daily.  , Disp: , Rfl:  .  rizatriptan (MAXALT-MLT) 10 MG disintegrating tablet, Take 1 tablet (10 mg total) by mouth as needed for migraine. May repeat in 2 hours if needed, Disp: 9 tablet, Rfl: 11 .  rOPINIRole (REQUIP) 0.25 MG tablet, Take 1-2 tablets (0.25-0.5 mg total) by mouth 2 (two) times daily., Disp: 120 tablet, Rfl: 6 .  topiramate (TOPAMAX) 50 MG tablet, Take 1 tablet (50 mg total) by mouth 2 (two) times daily., Disp: 60 tablet, Rfl: 12 .  traZODone (DESYREL) 50 MG tablet, Take 1 tablet (50 mg total) by mouth at bedtime., Disp: 90 tablet, Rfl: 3 .  TURMERIC CURCUMIN PO, , Disp: , Rfl:  .  vitamin C (ASCORBIC ACID) 500 MG tablet, , Disp: , Rfl:   Current Facility-Administered Medications:  .  0.9 %  sodium chloride infusion, 500 mL, Intravenous, Continuous, Danis, Kirke Corin, MD  EXAM:  VITALS per patient if applicable: none  GENERAL: alert, oriented, no audible acute distress  MS: per patient able to raise arms, get pain in R upper lateral shoulder down upper lateral arm, pain is mainly with  abduction above 90 degrees and today has very mild symptoms with abducting shoulder.  PSYCH/NEURO: pleasant and cooperative,speech and thought processing grossly intact  ASSESSMENT AND PLAN:  Discussed the following assessment and plan: More than 50% of over 15  minutes spent in total in caring for this patient was spent face-to-face with the patient, counseling and/or coordinating care.    Acute pain of right shoulder  Dyslipidemia  Reactive depression  Parkinson disease ([place])  For the shoulder pain discussed potential etiologies and options for evaluation and management. Suspect RTC. She opted to try some HEP in  light of COVID 19 and I will have my assistant send these to her. Will do visit in 3 weeks to recheck.  Reviewed last lipid panel and options for good CV health. Discussed healthy diet and ways to exercise  Despite gym being closed for COVID19 and stay at home orders.   She continues care with neurology and psychiatry for neurological and psychiatric conditions.   I discussed the assessment and treatment plan with the patient. The patient was provided an opportunity to ask questions and all were answered. The patient agreed with the plan and demonstrated an understanding of the instructions.   The patient was advised to call back or seek an in-person evaluation if the symptoms worsen or if the condition fails to improve as anticipated.  She is aware of me leaving clinical practice and would like to set up [facility] visit with Dr. Ethlyn Gallery in a few months. Will advise my assistant to arrange.  Follow up instructions: Advised assistant Wendie Simmer to help patient arrange the following: -send RTC exercise to patient via mail -arrange Webex with Dr. Maudie Mercury in 3 weeks for shoulder pain -arrange TOC visit with Dr. Ethlyn Gallery in 2-3 months   Lucretia Kern, DO

## 2019-01-13 NOTE — Telephone Encounter (Signed)
Per instructions in office note from 4/2, I called the pt and informed her rotator cuff exercises and Theraband was mailed to the home address.  Patient stated she will call back for a follow up if needed for her shoulder and for a transfer of care visit.

## 2019-01-18 ENCOUNTER — Other Ambulatory Visit: Payer: Self-pay | Admitting: Family Medicine

## 2019-01-20 ENCOUNTER — Other Ambulatory Visit: Payer: Self-pay | Admitting: Family Medicine

## 2019-01-25 ENCOUNTER — Other Ambulatory Visit: Payer: Self-pay | Admitting: Family Medicine

## 2019-01-25 ENCOUNTER — Encounter: Payer: Self-pay | Admitting: Family Medicine

## 2019-01-25 MED ORDER — CYCLOBENZAPRINE HCL 5 MG PO TABS: ORAL_TABLET | ORAL | 0 refills | Status: DC

## 2019-01-25 NOTE — Telephone Encounter (Signed)
I think already sent?

## 2019-01-25 NOTE — Telephone Encounter (Signed)
I think I sent this this morning when received? Can you check? Also apologize to her, but let her know today is the first day I got this and refilled immediately. Can you check into why no call or message from other days? Thanks!

## 2019-01-25 NOTE — Telephone Encounter (Signed)
See prior note

## 2019-02-07 ENCOUNTER — Telehealth: Payer: Self-pay | Admitting: Cardiology

## 2019-02-07 NOTE — Telephone Encounter (Signed)
6 month recall/lmtcb to schedule virtual visit.

## 2019-02-10 ENCOUNTER — Telehealth: Payer: Self-pay | Admitting: *Deleted

## 2019-02-10 NOTE — Telephone Encounter (Signed)
called patient and advised her that her FY can be moved sooner. Advised her due to current COVID 19 pandemic, our office is severely reducing in person visits in order to minimize the risk to our patients and healthcare providers. We recommend to convert your appointment to a video visit. We'll take all precautions to reduce any security or privacy concerns. This will be treated like an office visit, and we will file with your insurance. She consented to video visit. Pt's email is [email or web address]. . Pt understands that she'll receive e mail will instructions. We rescheduled her for sooner FU, updated EMR. Patient verbalized understanding, appreciation.

## 2019-02-14 NOTE — Telephone Encounter (Signed)
° ° °  Patient calling to confirm virtual visit for 5/6. Patient requesting VIDEO visit

## 2019-02-14 NOTE — Progress Notes (Signed)
Virtual Visit via Telephone Note   This visit type was conducted due to national recommendations for restrictions regarding the COVID-19 Pandemic (e.g. social distancing) in an effort to limit this patient's exposure and mitigate transmission in our community.  Due to her co-morbid illnesses, this patient is at least at moderate risk for complications without adequate follow up.  This format is felt to be most appropriate for this patient at this time.  The patient did not have access to video technology/had technical difficulties with video requiring transitioning to audio format only (telephone).  All issues noted in this document were discussed and addressed.  No physical exam could be performed with this format.  Please refer to the patient's chart for her  consent to telehealth for [facility].    Patient has given verbal permission to conduct this visit via virtual appointment and to bill insurance 02/16/2019 9:04 AM     Date:  02/16/2019   ID:  Shannon Sutton, DOB Nov 17, 1945, MRN [id number]  Patient Location: Home Provider Location: Home  PCP:  Terressa Koyanagi, DO  Cardiologist:  Jodelle Red, MD  Electrophysiologist:  None   Evaluation Performed:  Follow-Up Visit  Chief Complaint:  Fatigue and Hyperlipidemia   History of Present Illness:    Shannon Sutton is a 73 y.o. female with we are following for ongoing assessment and management of dizziness, she has also been seen by a neurologist and a psychiatrist with no clear etiology of her symptoms.  She was last seen by Dr. Cristal Deer on 08/20/2018.  Inflammatory markers were unrevealing, thyroid was normal.  No evidence of adrenal insufficiency. No cardiac testing was planned.  Of note, she did have a stress echocardiogram on 10/27/2014 which was negative.  EF of 55%. She is being seen by neurology for treatment of Parkinsonism (akinetic-rigid) and is on carbidopa-levodopa (Sinemet IR) 25-100 mg tablets TID.   She has not been  seen by PCP or had labs for one year. She is concerned that she has not been treated for hypercholesterolemia or had this evaluated. She states due to COVID, she has not been as active as she is staying home. She did go to the gym and walk on the treadmill until they closed.  She has also noticed that her BP machine has been reporting "irregular heart rate" when she takes her BP on occasion. She has been having some weak spells but associated this will her Parkinson's. She denies irregular HR, DOE, or chest pain. She was unaware of irregular HR when BP monitor reported this.    The patient does not have symptoms concerning for COVID-19 infection (fever, chills, cough, or new shortness of breath).    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   Recurrent  Dr Nickola Major 2009 had a bx/MRI: was on MTX and Prednisone   External hemorrhoids    Fibromyalgia with myofascial pain 11/16/2012   Headaches, cluster    Hypercholesterolemia 12/15/2013   Hypertension    Insomnia 11/16/2012   Myalgia and myositis, unspecified    Other disorder of muscle, ligament, and fascia    eosinophilic fascitis   Other specified disease of hair and hair follicles    Pain in joint, multiple sites  Restless leg syndrome 12/15/2013   Unspecified sleep apnea    Urine incontinence    Past Surgical History:  Procedure Laterality Date   ABDOMINOPLASTY  2006   "tummy tuck"   BREAST REDUCTION SURGERY  1995   BREAST SURGERY  1981   left breast biopsy   CATARACT EXTRACTION, BILATERAL     MUSCLE BIOPSY  2009   DEEP TISSUE   REDUCTION MAMMAPLASTY     REFRACTIVE SURGERY     TONSILLECTOMY     TOTAL ABDOMINAL HYSTERECTOMY  1999   WISDOM TOOTH EXTRACTION       Current Meds  Medication Sig    B Complex-C (SUPER B COMPLEX) TABS Take 1 tablet by mouth daily.    Calcium Citrate-Vitamin D (EQ CALCIUM CITRATE+D3 PO)    carbidopa-levodopa (SINEMET IR) 25-100 MG tablet Take 1 tablet by mouth 3 (three) times daily before meals. (Patient taking differently: Take 1 tablet by mouth 3 (three) times daily before meals. Has not started yet)   clonazePAM (KLONOPIN) 0.5 MG tablet 1  qhs   Coenzyme Q10 (COQ-10) 100 MG CAPS Take 1 tablet by mouth daily.    cyclobenzaprine (FLEXERIL) 5 MG tablet TAKE 1 TABLET BY MOUTH TWICE DAILY AS NEEDED FOR MUSCLE SPASM   DHEA 50 MG TABS    FLUoxetine (PROZAC) 20 MG capsule Take 1 capsule (20 mg total) by mouth daily.   folic acid (FOLVITE) 800 MCG tablet Take 400 mcg by mouth daily.   LINZESS 290 MCG CAPS capsule TAKE 1 CAPSULE BY MOUTH ONCE DAILY   magnesium 30 MG tablet Take 30 mg by mouth 2 (two) times daily.   Multiple Vitamins-Minerals (MULTIVITAMIN WITH MINERALS) tablet Take 1 tablet by mouth daily.     rOPINIRole (REQUIP) 0.25 MG tablet Take 1-2 tablets (0.25-0.5 mg total) by mouth 2 (two) times daily.   traZODone (DESYREL) 50 MG tablet Take 1 tablet (50 mg total) by mouth at bedtime. (Patient taking differently: Take 25 mg by mouth at bedtime. )   TURMERIC CURCUMIN PO    vitamin C (ASCORBIC ACID) 500 MG tablet    Current Facility-Administered Medications for the 02/16/19 encounter (Telemedicine) with Jodelle Gross, NP  Medication   0.9 %  sodium chloride infusion     Allergies:   Crestor [rosuvastatin calcium]; Cymbalta [duloxetine hcl]; Effexor xr [venlafaxine hcl er]; Morphine and related; Pravastatin; Promethazine-codeine; and Rizatriptan   Social History   Tobacco Use   Smoking status: Former Smoker    Last attempt to quit: 12/19/1986    Years since quitting: 32.1   Smokeless tobacco: Never Used  Substance Use Topics   Alcohol use: Yes    Alcohol/week: 1.0 standard drinks    Types: 1 Glasses of wine per week     Comment: 2-3 x year   Drug use: No     Family Hx: The patient's family history includes Breast cancer in her paternal grandmother; COPD in her brother and sister; Colon cancer (age of onset: 19) in her father; Colon polyps in her mother and paternal grandfather; Emphysema in her brother and sister; Heart attack in her mother; Heart disease in her father, mother, paternal grandfather, and another family member; Lung cancer in her brother; Parkinson's disease in an other family member. There is no history of Stomach cancer, Rectal cancer, or Esophageal cancer.  ROS:   Please see the history of present illness.    All other systems reviewed and are negative.   Prior CV studies:   The  following studies were reviewed today:  Study Conclusions 10/27/2014  - Stress ECG conclusions: There were no stress arrhythmias or conduction abnormalities. The stress ECG was normal. - Staged echo: There was no echocardiographic evidence for stress-induced ischemia.  Impressions:  - Stress echocardiogram with no chest pain, no ST changes and no stress-induced wall motion abnormalities; normal stress echocardiogram.  Labs/Other Tests and Data Reviewed:    EKG:  No ECG reviewed.  Recent Labs: 03/18/2018: ALT 11; BUN 13; Creatinine, Ser 0.60; Hemoglobin 13.9; Platelets 213.0; Potassium 4.0; Sodium 142; TSH 2.85   Recent Lipid Panel Lab Results  Component Value Date/Time   CHOL 204 (H) 03/18/2018 08:14 AM   TRIG 57.0 03/18/2018 08:14 AM   HDL 69.20 03/18/2018 08:14 AM   CHOLHDL 3 03/18/2018 08:14 AM   LDLCALC 123 (H) 03/18/2018 08:14 AM    Wt Readings from Last 3 Encounters:  02/16/19 125 lb (56.7 kg)  11/01/18 127 lb (57.6 kg)  09/14/18 127 lb 1.6 oz (57.7 kg)     Objective:    Vital Signs:  BP (!) 142/81    Pulse 76    Ht 5' 1.5" (1.562 m)    Wt 125 lb (56.7 kg)    BMI 23.24 kg/m    VITAL SIGNS:  reviewed  Limited due to telephone visit  AAO X 3 with no acute distress Did  not appear dyspneic when speaking to me. Able to ask and answer questions appropriately.   ASSESSMENT & PLAN:    1. Hypertension: BP has been elevated at home. She is not on antihypertensive medications currently.  She is to take her BP daily at the same time each day and record. She is to also take her BP when she is feeling weak.  BMET and CBC will be ordered.  2  Hyperlipidemia: She has not had labs completed for one year. Is not on any statin therapy at this time. Most recent labs 03/18/2018 reported TC of 204 and LDL of 123. I will repeat these labs for ongoing assessment.   3. Irregular Heart rate; Self reported by viewing her BP machine. She may need to have cardiac monitor placed for further evaluation.  This can be discussed on next appointment. Will review her labs first to assess for abnormalities which may be contributing before additional testing. Will need EKG on next office visit.   4. Parkinson's: Followed by neurology. Sinemet may be contributing to cardiac arrhythmia. Will follow up in the office with EKG and discussion of need for cardiac monitor.   COVID-19 Education: The signs and symptoms of COVID-19 were discussed with the patient and how to seek care for testing (follow up with PCP or arrange E-visit).  The importance of social distancing was discussed today.  Time:   Today, I have spent 15 minutes with the patient with telehealth technology discussing the above problems.     Medication Adjustments/Labs and Tests Ordered: Current medicines are reviewed at length with the patient today.  Concerns regarding medicines are outlined above.   Tests Ordered: Fasting lipids and LFT's, BMET, CBC, TSH, and Hgb A1c.   Medication Changes: No orders of the defined types were placed in this encounter.   Disposition:  Follow up in 6 week(s) with Dr. Cristal Deer   Signed, Bettey Mare. Liborio Nixon, ANP, AACC  02/16/2019 8:53 AM    [facility]

## 2019-02-15 ENCOUNTER — Other Ambulatory Visit: Payer: Self-pay

## 2019-02-15 ENCOUNTER — Ambulatory Visit (INDEPENDENT_AMBULATORY_CARE_PROVIDER_SITE_OTHER): Payer: Medicare HMO | Admitting: Diagnostic Neuroimaging

## 2019-02-15 ENCOUNTER — Telehealth: Payer: Self-pay | Admitting: Adult Health

## 2019-02-15 DIAGNOSIS — G20 Parkinson's disease: Secondary | ICD-10-CM | POA: Diagnosis not present

## 2019-02-15 MED ORDER — CARBIDOPA-LEVODOPA 25-100 MG PO TABS: 1.0000 | ORAL_TABLET | Freq: Three times a day (TID) | ORAL | 12 refills | Status: DC

## 2019-02-15 MED ORDER — ROPINIROLE HCL 0.25 MG PO TABS: 0.2500 mg | ORAL_TABLET | Freq: Two times a day (BID) | ORAL | 6 refills | Status: DC

## 2019-02-15 NOTE — Telephone Encounter (Signed)
Patient wants tele-visit instead of video/ consent/ my chart/ pre reg completed

## 2019-02-15 NOTE — Progress Notes (Signed)
Virtual Visit via Video Note  I connected with Shannon Sutton on 02/15/19 at 10:00 AM EDT by a video enabled telemedicine application and verified that I am speaking with the correct person using two identifiers.   I discussed the limitations of evaluation and management by telemedicine and the availability of in person appointments. The patient expressed understanding and agreed to proceed.  Patient is at their home. I am at the office.    History of Present Illness:  - since last visit, sxs are stable - more fatigue, depression - more muscle spasms - HA are stable (could not tolerate meds)    Observations/Objective:  VIDEO EXAM  GENERAL EXAM/CONSTITUTIONAL:  Vitals: There were no vitals filed for this visit.  There is no height or weight on file to calculate BMI. Wt Readings from Last 3 Encounters:  11/01/18 127 lb (57.6 kg)  09/14/18 127 lb 1.6 oz (57.7 kg)  08/25/18 126 lb 3.2 oz (57.2 kg)     Patient is in no distress; well developed, nourished and groomed; neck is supple  NEUROLOGIC: MENTAL STATUS:  No flowsheet data found.  awake, alert, oriented to person, place and time  recent and remote memory intact  normal attention and concentration  language fluent, comprehension intact, naming intact  fund of knowledge appropriate  CRANIAL NERVE:   2nd, 3rd, 4th, 6th - visual fields full to confrontation, extraocular muscles intact, no nystagmus  5th - facial sensation symmetric  7th - facial strength symmetric  8th - hearing intact  11th - shoulder shrug symmetric  12th - tongue protrusion midline  SLIGHTLY MASKED FACIES  MOTOR:   NO TREMOR; NO DRIFT IN BUE  SENSORY:   normal and symmetric to light touch  COORDINATION:   fine finger movements SLOW  12/30/18 DATscan - Subtle truncation of the posterior RIGHT striata compared to the LEFT. While this could be an anatomic variation, the basal ganglia are symmetric RIGHT to LEFT on  comparison MRI. Findings could indicate Parkinson's syndrome pathology.   Assessment and Plan:  73 y.o.   Dx:  1. Parkinsonism, unspecified Parkinsonism type ([place])      PARKINSONISM (akinetic-rigid) - ropinirole 0.25 / 0.5mg  (could not tolerate higher dose) - start carb/levo half tab three times a day x 2 weeks; then increase to 1 tab three times a day  - continue physical therapy for gait and balance training - improve nutrition, depression and anxiety treatment - monitor BP at home   Meds ordered this encounter  Medications   carbidopa-levodopa (SINEMET IR) 25-100 MG tablet    Sig: Take 1 tablet by mouth 3 (three) times daily before meals.    Dispense:  90 tablet    Refill:  12   rOPINIRole (REQUIP) 0.25 MG tablet    Sig: Take 1-2 tablets (0.25-0.5 mg total) by mouth 2 (two) times daily.    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    I discussed the assessment and treatment plan with the patient. The patient was provided an opportunity to ask questions and all were answered. The patient agreed with the plan and demonstrated an understanding of the instructions.   The patient was  advised to call back or seek an in-person evaluation if the symptoms worsen or if the condition fails to improve as anticipated.  I provided 15 minutes of non-face-to-face time during this encounter.   Penni Bombard, MD 12/12/9516, 84:16 AM Certified in Neurology, Neurophysiology and Neuroimaging  [facility] [address], [place], [place] [address] [phone]

## 2019-02-16 ENCOUNTER — Telehealth (INDEPENDENT_AMBULATORY_CARE_PROVIDER_SITE_OTHER): Payer: Medicare HMO | Admitting: Adult Health

## 2019-02-16 ENCOUNTER — Encounter: Payer: Self-pay | Admitting: Adult Health

## 2019-02-16 VITALS — BP 142/81 | HR 76 | Ht 61.5 in | Wt 125.0 lb

## 2019-02-16 DIAGNOSIS — E785 Hyperlipidemia, unspecified: Secondary | ICD-10-CM | POA: Diagnosis not present

## 2019-02-16 DIAGNOSIS — I951 Orthostatic hypotension: Secondary | ICD-10-CM

## 2019-02-16 DIAGNOSIS — G20 Parkinson's disease: Secondary | ICD-10-CM

## 2019-02-16 DIAGNOSIS — I10 Essential (primary) hypertension: Secondary | ICD-10-CM

## 2019-02-16 DIAGNOSIS — R42 Dizziness and giddiness: Secondary | ICD-10-CM

## 2019-02-16 DIAGNOSIS — G20A1 Parkinson's disease without dyskinesia, without mention of fluctuations: Secondary | ICD-10-CM

## 2019-02-16 NOTE — Patient Instructions (Signed)
Medication Instructions:  The current medical regimen is effective;  continue present plan and medications.  If you need a refill on your cardiac medications before your next appointment, please call your pharmacy.   Lab work: CBC, BMET, Liver function, A1C, Lipid panel on Friday If you have labs (blood work) drawn today and your tests are completely normal, you will receive your results only by: Marland Kitchen MyChart Message (if you have MyChart) OR . A paper copy in the mail If you have any lab test that is abnormal or we need to change your treatment, we will call you to review the results.  Follow-Up: At [facility], you and your health needs are our priority.  As part of our continuing mission to provide you with exceptional heart care, we have created designated Provider Care Teams.  These Care Teams include your primary Cardiologist (physician) and Advanced Practice Providers (APPs -  Physician Assistants and Nurse Practitioners) who all work together to provide you with the care you need, when you need it. You will need a follow up appointment in office visit when available.  You may see Buford Dresser, MD or one of the following Advanced Practice Providers on your designated Care Team: [place], [place] . Fabian Sharp, PA-C

## 2019-02-18 DIAGNOSIS — E785 Hyperlipidemia, unspecified: Secondary | ICD-10-CM | POA: Diagnosis not present

## 2019-02-18 DIAGNOSIS — I951 Orthostatic hypotension: Secondary | ICD-10-CM | POA: Diagnosis not present

## 2019-02-18 DIAGNOSIS — R42 Dizziness and giddiness: Secondary | ICD-10-CM | POA: Diagnosis not present

## 2019-02-19 LAB — BASIC METABOLIC PANEL
BUN/Creatinine Ratio: 12 (ref 12–28)
BUN: 9 mg/dL (ref 8–27)
CO2: 24 mmol/L (ref 20–29)
Calcium: 9.8 mg/dL (ref 8.7–10.3)
Chloride: 100 mmol/L (ref 96–106)
Creatinine, Ser: 0.75 mg/dL (ref 0.57–1.00)
GFR calc Af Amer: 90 mL/min/{1.73_m2} (ref >59)
GFR calc non Af Amer: 78 mL/min/{1.73_m2} (ref >59)
Glucose: 86 mg/dL (ref 65–99)
Potassium: 4.1 mmol/L (ref 3.5–5.2)
Sodium: 140 mmol/L (ref 134–144)

## 2019-02-19 LAB — CBC
Hematocrit: 38.1 % (ref 34.0–46.6)
Hemoglobin: 13.5 g/dL (ref 11.1–15.9)
MCH: 31.9 pg (ref 26.6–33.0)
MCHC: 35.4 g/dL (ref 31.5–35.7)
MCV: 90 fL (ref 79–97)
Platelets: 219 10*3/uL (ref 150–450)
RBC: 4.23 x10E6/uL (ref 3.77–5.28)
RDW: 12.3 % (ref 11.7–15.4)
WBC: 3.9 10*3/uL (ref 3.4–10.8)

## 2019-02-19 LAB — HEPATIC FUNCTION PANEL
ALT: 11 IU/L (ref 0–32)
AST: 16 IU/L (ref 0–40)
Albumin: 4.8 g/dL — ABNORMAL HIGH (ref 3.7–4.7)
Alkaline Phosphatase: 60 IU/L (ref 39–117)
Bilirubin Total: 0.4 mg/dL (ref 0.0–1.2)
Bilirubin, Direct: 0.11 mg/dL (ref 0.00–0.40)
Total Protein: 6.8 g/dL (ref 6.0–8.5)

## 2019-02-19 LAB — LIPID PANEL
Chol/HDL Ratio: 2.9 ratio (ref 0.0–4.4)
Cholesterol, Total: 246 mg/dL — ABNORMAL HIGH (ref 100–199)
HDL: 84 mg/dL (ref >39)
LDL Calculated: 144 mg/dL — ABNORMAL HIGH (ref 0–99)
Triglycerides: 91 mg/dL (ref 0–149)
VLDL Cholesterol Cal: 18 mg/dL (ref 5–40)

## 2019-02-19 LAB — HEMOGLOBIN A1C
Est. average glucose Bld gHb Est-mCnc: 97 mg/dL
Hgb A1c MFr Bld: 5 % (ref 4.8–5.6)

## 2019-02-19 LAB — TSH: TSH: 2.44 u[IU]/mL (ref 0.450–4.500)

## 2019-02-21 ENCOUNTER — Other Ambulatory Visit (HOSPITAL_COMMUNITY): Payer: Self-pay | Admitting: Psychiatry

## 2019-02-24 ENCOUNTER — Other Ambulatory Visit (HOSPITAL_COMMUNITY): Payer: Self-pay

## 2019-02-24 MED ORDER — FLUOXETINE HCL 20 MG PO CAPS: 20.0000 mg | ORAL_CAPSULE | Freq: Every day | ORAL | 0 refills | Status: DC

## 2019-02-28 ENCOUNTER — Other Ambulatory Visit: Payer: Self-pay

## 2019-02-28 DIAGNOSIS — E785 Hyperlipidemia, unspecified: Secondary | ICD-10-CM

## 2019-02-28 DIAGNOSIS — Z79899 Other long term (current) drug therapy: Secondary | ICD-10-CM

## 2019-02-28 MED ORDER — ATORVASTATIN CALCIUM 40 MG PO TABS: 40.0000 mg | ORAL_TABLET | Freq: Every day | ORAL | 2 refills | Status: DC

## 2019-02-28 NOTE — Progress Notes (Signed)
Error

## 2019-02-28 NOTE — Progress Notes (Signed)
Orders entered given by Jory Sims, NP

## 2019-02-28 NOTE — Progress Notes (Signed)
The patient has been notified of the result and verbalized understanding.  All questions (if any) were answered. Jacqulynn Cadet, [place] 02/28/2019 3:07 PM

## 2019-03-03 DIAGNOSIS — H40013 Open angle with borderline findings, low risk, bilateral: Secondary | ICD-10-CM | POA: Diagnosis not present

## 2019-03-03 DIAGNOSIS — H35373 Puckering of macula, bilateral: Secondary | ICD-10-CM | POA: Diagnosis not present

## 2019-03-08 ENCOUNTER — Ambulatory Visit: Payer: Medicare HMO | Admitting: Diagnostic Neuroimaging

## 2019-03-23 ENCOUNTER — Ambulatory Visit (INDEPENDENT_AMBULATORY_CARE_PROVIDER_SITE_OTHER): Payer: Medicare HMO | Admitting: Psychiatry

## 2019-03-23 ENCOUNTER — Other Ambulatory Visit: Payer: Self-pay | Admitting: Family Medicine

## 2019-03-23 ENCOUNTER — Encounter (HOSPITAL_COMMUNITY): Payer: Self-pay | Admitting: Psychiatry

## 2019-03-23 ENCOUNTER — Other Ambulatory Visit: Payer: Self-pay

## 2019-03-23 VITALS — BP 144/79 | HR 80 | Temp 98.1°F | Ht 61.5 in | Wt 127.0 lb

## 2019-03-23 DIAGNOSIS — F325 Major depressive disorder, single episode, in full remission: Secondary | ICD-10-CM | POA: Diagnosis not present

## 2019-03-23 DIAGNOSIS — R69 Illness, unspecified: Secondary | ICD-10-CM | POA: Diagnosis not present

## 2019-03-23 MED ORDER — FLUOXETINE HCL 20 MG PO CAPS: 20.0000 mg | ORAL_CAPSULE | Freq: Every day | ORAL | 1 refills | Status: DC

## 2019-03-23 MED ORDER — CLONAZEPAM 0.5 MG PO TABS: ORAL_TABLET | ORAL | 4 refills | Status: DC

## 2019-03-23 NOTE — Progress Notes (Signed)
Psychiatric Initial Adult Assessment   Patient Identification: Shannon Sutton MRN:  [id number] Date of Evaluation:  03/23/2019 Referral Source Dr. Alain Marion Chief Complaint:  Depression Visit Diagnosis major depression single episode in remission  History of Present Illness  Today the patient is actually doing fairly well.  The patient was exercising regularly but unfortunately the gyms are all closed because of the pandemic.  The patient is bored and feels somewhat isolated.  Generally she is sleeping and eating fairly well.  She takes Klonopin and trazodone for sleep.  Her appetite is good she is got good energy and overall her mood is actually good spirits.  She denies the use of drugs or alcohol.  She is never been psychotic.  She has a new primary care doctor Dr. Augustin Coupe.  The patient's Parkinson is actively being treated and actually seems to be fairly well controlled.  She takes Sinemet and other medications.  Financially she is very stable.  She likes the home she lives in.  She has 2 daughters 1 of which is in town that she sees now and then.  Overall she feels well.  She just feels isolated and a bit mildly limited because of the pandemic. Associated Signs/Symptoms: Depression Symptoms:  depressed mood, (Hypo) Manic Symptoms:   Anxiety Symptoms:   Psychotic Symptoms:   PTSD Symptoms:   Past Psychiatric History: Prozac, Effexor,Vybrid  Previous Psychotropic Medications: Yes   Substance Abuse History in the last 12 months:  No.  Consequences of Substance Abuse:   Past Medical History:  Past Medical History:  Diagnosis Date  . Adenomatous colon polyp 1990  . Allergy   . Anxiety 12/15/2013  . Arthritis   . Cataract   . Chronic constipation   . Depression 11/16/2012  . Diverticulitis   . Diverticulosis   . 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 External hemorrhoids   . Fibromyalgia with myofascial pain 11/16/2012   . Headaches, cluster   . Hypercholesterolemia 12/15/2013  . Hypertension   . Insomnia 11/16/2012  . Myalgia and myositis, unspecified   . Other disorder of muscle, ligament, and fascia    eosinophilic fascitis  . Other specified disease of hair and hair follicles   . Pain in joint, multiple sites   . Restless leg syndrome 12/15/2013  . Unspecified sleep apnea   . Urine incontinence     Past Surgical History:  Procedure Laterality Date  . ABDOMINOPLASTY  2006   "tummy tuck"  . BREAST REDUCTION SURGERY  1995  . BREAST SURGERY  1981   left breast biopsy  . CATARACT EXTRACTION, BILATERAL    . MUSCLE BIOPSY  2009   DEEP TISSUE  . REDUCTION MAMMAPLASTY    . REFRACTIVE SURGERY    . TONSILLECTOMY    . TOTAL ABDOMINAL HYSTERECTOMY  1999  . WISDOM TOOTH EXTRACTION      Family Psychiatric History:   Family History:  Family History  Problem Relation Age of Onset  . Colon cancer Father 52  . Heart disease Father   . Colon polyps Mother   . Heart disease Mother   . Heart attack Mother   . COPD Sister   . Emphysema Sister   . COPD Brother   . Emphysema Brother   . Lung cancer Brother   . Breast cancer Paternal Grandmother   . Heart disease Other        family history  .  Parkinson's disease Other   . Heart disease Paternal Grandfather   . Colon polyps Paternal Grandfather   . Stomach cancer Neg Hx   . Rectal cancer Neg Hx   . Esophageal cancer Neg Hx     Social History:   Social History   Socioeconomic History  . Marital status: Divorced    Spouse name: Not on file  . Number of children: 2  . Years of education: Not on file  . Highest education level: Not on file  Occupational History  . Occupation: Retired    Fish farm manager: RETIRED    CommentAstronomer  Social Needs  . Financial resource strain: Not on file  . Food insecurity:    Worry: Not on file    Inability: Not on file  . Transportation needs:    Medical: Not on file    Non-medical: Not on file   Tobacco Use  . Smoking status: Former Smoker    Last attempt to quit: 12/19/1986    Years since quitting: 32.2  . Smokeless tobacco: Never Used  Substance and Sexual Activity  . Alcohol use: Yes    Alcohol/week: 1.0 standard drinks    Types: 1 Glasses of wine per week    Comment: 2-3 x year  . Drug use: No  . Sexual activity: Yes    Birth control/protection: Surgical, Post-menopausal  Lifestyle  . Physical activity:    Days per week: Not on file    Minutes per session: Not on file  . Stress: Not on file  Relationships  . Social connections:    Talks on phone: Not on file    Gets together: Not on file    Attends religious service: Not on file    Active member of club or organization: Not on file    Attends meetings of clubs or organizations: Not on file    Relationship status: Not on file  Other Topics Concern  . Not on file  Social History Narrative   Lives alone.  Retired.     Education- 2 years of college   2 daughters   caffeine daily 2-3 cups    Additional Social History:   Allergies:   Allergies  Allergen Reactions  . Crestor [Rosuvastatin Calcium]     abd pain  . Cymbalta [Duloxetine Hcl]     Nausea   . Effexor Xr [Venlafaxine Hcl Er]     dizziness  . Morphine And Related Itching    agitation  . Pravastatin     abd pain  . Promethazine-Codeine Itching    Itching in face  . Rizatriptan Other (See Comments)    Tingling, prickly sensation, feelings of palpitations, flushing.     Metabolic Disorder Labs: Lab Results  Component Value Date   HGBA1C 5.0 02/18/2019   No results found for: PROLACTIN Lab Results  Component Value Date   CHOL 246 (H) 02/18/2019   TRIG 91 02/18/2019   HDL 84 02/18/2019   CHOLHDL 2.9 02/18/2019   VLDL 11.4 03/18/2018   LDLCALC 144 (H) 02/18/2019   LDLCALC 123 (H) 03/18/2018     Current Medications: Current Outpatient Medications  Medication Sig Dispense Refill  . atorvastatin (LIPITOR) 40 MG tablet Take 1 tablet  (40 mg total) by mouth daily. 30 tablet 2  . B Complex-C (SUPER B COMPLEX) TABS Take 1 tablet by mouth daily.     . Calcium Citrate-Vitamin D (EQ CALCIUM CITRATE+D3 PO)     . carbidopa-levodopa (SINEMET IR) 25-100 MG  tablet Take 1 tablet by mouth 3 (three) times daily before meals. (Patient taking differently: Take 1 tablet by mouth 3 (three) times daily before meals. Has not started yet) 90 tablet 12  . clonazePAM (KLONOPIN) 0.5 MG tablet 1  qhs 30 tablet 4  . Coenzyme Q10 (COQ-10) 100 MG CAPS Take 1 tablet by mouth daily.     . cyclobenzaprine (FLEXERIL) 5 MG tablet TAKE 1 TABLET BY MOUTH TWICE DAILY AS NEEDED FOR MUSCLE SPASM 30 tablet 0  . DHEA 50 MG TABS     . FLUoxetine (PROZAC) 20 MG capsule Take 1 capsule (20 mg total) by mouth daily. 90 capsule 1  . folic acid (FOLVITE) 263 MCG tablet Take 400 mcg by mouth daily.    Marland Kitchen LINZESS 290 MCG CAPS capsule TAKE 1 CAPSULE BY MOUTH ONCE DAILY 90 capsule 3  . magnesium 30 MG tablet Take 30 mg by mouth 2 (two) times daily.    . Multiple Vitamins-Minerals (MULTIVITAMIN WITH MINERALS) tablet Take 1 tablet by mouth daily.      Marland Kitchen rOPINIRole (REQUIP) 0.25 MG tablet Take 1-2 tablets (0.25-0.5 mg total) by mouth 2 (two) times daily. 120 tablet 6  . traZODone (DESYREL) 50 MG tablet Take 1 tablet (50 mg total) by mouth at bedtime. (Patient taking differently: Take 25 mg by mouth at bedtime. ) 90 tablet 3  . TURMERIC CURCUMIN PO     . vitamin C (ASCORBIC ACID) 500 MG tablet      Current Facility-Administered Medications  Medication Dose Route Frequency Provider Last Rate Last Dose  . 0.9 %  sodium chloride infusion  500 mL Intravenous Continuous Danis, Estill Cotta III, MD        Neurologic: Headache: No Seizure: No Paresthesias:No  Musculoskeletal: Strength & Muscle Tone: abnormal Gait & Station: unsteady Patient leans: N/A  Psychiatric Specialty Exam: ROS  Blood pressure (!) 144/79, pulse 80, temperature 98.1 F (36.7 C), height 5' 1.5" (1.562 m),  weight 127 lb (57.6 kg).Body mass index is 23.61 kg/m.  General Appearance: Fairly Groomed  Eye Contact:  Good  Speech:  Clear and Coherent  Volume:  Normal  Mood:  Depressed  Affect:  Appropriate  Thought Process:  Goal Directed  Orientation:  Full (Time, Place, and Person)  Thought Content:  WDL  Suicidal Thoughts:  No  Homicidal Thoughts:  No  Memory:  NA  Judgement:  Good  Insight:  Fair  Psychomotor Activity:  Normal  Concentration:    Recall:  [place] of Knowledge:Good  Language: Fair  Akathisia:  No  Handed:  Right  AIMS (if indicated):    Assets:  Desire for Improvement  ADL's:  Intact  Cognition: WNL  Sleep:      Treatment Plan Summary: At this time the patient is doing fairly well.  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  Her second problem is insomnia and for that she takes Klonopin 0.5 mg and trazodone 50 mg.  Patient is treated for Parkinson's and seems to be fairly stable.  Overall she is functioning well has good energy and is looking to get out more.  The virus is limiting her but she is remaining positive and optimistic.  She is not letting the virus get her down. Jerral Ralph, MD 6/10/20203:51 PM

## 2019-03-24 ENCOUNTER — Other Ambulatory Visit: Payer: Self-pay | Admitting: Family Medicine

## 2019-04-05 ENCOUNTER — Ambulatory Visit: Payer: Self-pay

## 2019-04-05 NOTE — Telephone Encounter (Signed)
Pt calling with Covid 19 symptoms: nausea, feeling out of balance,fatigue,headache. Pt stated symptoms began yesterday. Pt is wanting Covid testing because her grandson is coming in from out of town and she wants to make sure she is negative Covid.  Pt given care advice and pt verbalized understanding. Routing note to [facility] to get virtual appt.   Reason for Disposition . [1] COVID-19 infection suspected by caller or triager AND [2] mild symptoms (cough, fever, or others) AND [8] no complications or SOB  Answer Assessment - Initial Assessment Questions 1. COVID-19 DIAGNOSIS: "Who made your Coronavirus (COVID-19) diagnosis?" "Was it confirmed by a positive lab test?" If not diagnosed by a HCP, ask "Are there lots of cases (community spread) where you live?" (See public health department website, if unsure)     No  Testing yet-no 2. ONSET: "When did the COVID-19 symptoms start?"      today 3. WORST SYMPTOM: "What is your worst symptom?" (e.g., cough, fever, shortness of breath, muscle aches)     dizziness 4. COUGH: "Do you have a cough?" If so, ask: "How bad is the cough?"      Dry x a few months 5. FEVER: "Do you have a fever?" If so, ask: "What is your temperature, how was it measured, and when did it start?"     no 6. RESPIRATORY STATUS: "Describe your breathing?" (e.g., shortness of breath, wheezing, unable to speak)      No issues 7. BETTER-SAME-WORSE: "Are you getting better, staying the same or getting worse compared to yesterday?"  If getting worse, ask, "In what way?"     worse 8. HIGH RISK DISEASE: "Do you have any chronic medical problems?" (e.g., asthma, heart or lung disease, weak immune system, etc.)     H/o eosinophilic fascitis 9. PREGNANCY: "Is there any chance you are pregnant?" "When was your last menstrual period?"     n/a 10. OTHER SYMPTOMS: "Do you have any other symptoms?"  (e.g., chills, fatigue, headache, loss of smell or taste, muscle pain, sore throat)  Fatigue, headache,previous h/o loss of taste and smell, nausea, dizziness (out of balance intermittently),  Feeling worse.  Protocols used: CORONAVIRUS (COVID-19) DIAGNOSED OR SUSPECTED-A-AH

## 2019-04-06 NOTE — Telephone Encounter (Signed)
I called the pt and offered a virtual visit today to discuss testing and the pt declined.  Stated she will call back if needed.

## 2019-04-07 DIAGNOSIS — Z1159 Encounter for screening for other viral diseases: Secondary | ICD-10-CM | POA: Diagnosis not present

## 2019-05-02 ENCOUNTER — Other Ambulatory Visit: Payer: Self-pay | Admitting: Family Medicine

## 2019-05-05 ENCOUNTER — Other Ambulatory Visit: Payer: Self-pay | Admitting: Family Medicine

## 2019-05-09 NOTE — Telephone Encounter (Signed)
Patient called in checking in on status of med refill.

## 2019-05-10 NOTE — Telephone Encounter (Signed)
I called the pt and informed her of the message below.  Patient is aware the refill was sent to the pharmacy and stated she will call back to schedule an appt with a new PCP of her choice as she prefers a full time physician.

## 2019-05-10 NOTE — Telephone Encounter (Signed)
Please let pt know sorry for the delay. This was just sent to me late yesterday and I just got it this morning. Ok to [employer]. Due for follow up  - please schedule. Thanks.

## 2019-05-10 NOTE — Telephone Encounter (Signed)
Dr. Kim please advise 

## 2019-05-16 ENCOUNTER — Other Ambulatory Visit: Payer: Self-pay

## 2019-05-16 ENCOUNTER — Encounter: Payer: Self-pay | Admitting: Cardiology

## 2019-05-16 ENCOUNTER — Ambulatory Visit: Payer: Medicare HMO | Admitting: Cardiology

## 2019-05-16 VITALS — BP 106/64 | HR 93 | Temp 98.2°F | Ht 61.5 in | Wt 127.4 lb

## 2019-05-16 DIAGNOSIS — E785 Hyperlipidemia, unspecified: Secondary | ICD-10-CM | POA: Diagnosis not present

## 2019-05-16 DIAGNOSIS — I4949 Other premature depolarization: Secondary | ICD-10-CM

## 2019-05-16 DIAGNOSIS — Z7189 Other specified counseling: Secondary | ICD-10-CM | POA: Diagnosis not present

## 2019-05-16 DIAGNOSIS — R0989 Other specified symptoms and signs involving the circulatory and respiratory systems: Secondary | ICD-10-CM

## 2019-05-16 DIAGNOSIS — Z79899 Other long term (current) drug therapy: Secondary | ICD-10-CM | POA: Diagnosis not present

## 2019-05-16 LAB — LIPID PANEL
Chol/HDL Ratio: 2 ratio (ref 0.0–4.4)
Cholesterol, Total: 187 mg/dL (ref 100–199)
HDL: 94 mg/dL (ref >39)
LDL Calculated: 84 mg/dL (ref 0–99)
Triglycerides: 47 mg/dL (ref 0–149)
VLDL Cholesterol Cal: 9 mg/dL (ref 5–40)

## 2019-05-16 LAB — HEPATIC FUNCTION PANEL
ALT: 29 IU/L (ref 0–32)
AST: 29 IU/L (ref 0–40)
Albumin: 5 g/dL — ABNORMAL HIGH (ref 3.7–4.7)
Alkaline Phosphatase: 125 IU/L — ABNORMAL HIGH (ref 39–117)
Bilirubin Total: 0.4 mg/dL (ref 0.0–1.2)
Bilirubin, Direct: 0.15 mg/dL (ref 0.00–0.40)
Total Protein: 6.8 g/dL (ref 6.0–8.5)

## 2019-05-16 NOTE — Patient Instructions (Addendum)
Medication Instructions:  Your Physician recommend you continue on your current medication as directed.    If you need a refill on your cardiac medications before your next appointment, please call your pharmacy.   Lab work: Your physician recommends that you return for lab work today (lipid, LFT)   Testing/Procedures: None  Follow-Up: At [facility], you and your health needs are our priority.  As part of our continuing mission to provide you with exceptional heart care, we have created designated Provider Care Teams.  These Care Teams include your primary Cardiologist (physician) and Advanced Practice Providers (APPs -  Physician Assistants and Nurse Practitioners) who all work together to provide you with the care you need, when you need it. You will need a follow up appointment in 1 years.  Please call our office 2 months in advance to schedule this appointment.  You may see Buford Dresser, MD or one of the following Advanced Practice Providers on your designated Care Team:   Rosaria Ferries, PA-C . Jory Sims, DNP, ANP

## 2019-05-16 NOTE — Progress Notes (Signed)
Cardiology Office Note:    Date:  05/16/2019   ID:  Shannon Sutton, DOB Sep 04, 1946, MRN [id number]  PCP:  Terressa Koyanagi, DO  Cardiologist:  Jodelle Red, MD PhD  Referring MD: Terressa Koyanagi, DO   CC: follow up of symptoms  History of Present Illness:    Shannon Sutton is a 73 y.o. female with a hx of eosinophilic fasciitis, Parkinson's disease, depression who is seen in follow up for dizziness, fatigue. Her initial consult with me was on 05/20/18. Summary of that visit was related to symptoms that started in 02/2017; progressive fatigue, lightheadedness with exertion and changing position. No syncope. Happens every day. Lasts all day long. Very light sensitive. She does have a history of autoimmune disease (eosinophilic fascitis). This was prior to her Parkinson's diagnosis.  Concerns today:  Since last visit with me, she was diagnosed with Parkinson's. Fatigue has been hardest issue, also has dizzyness. Has had minimal activity since coronavirus pandemic.  Last seen via virtual visit 02/16/19 by Joni Reining. Had lipids, CBC, A1c, CMP, TSH done at that visit. Based on the results of her bloodwork, she started atorvastatin 40 mg daily. She has been taking this daily for just over a month. Has tolerated atorvastatin without issue. Taking daily, at bedtime.   BP most of the time is low, has had issues with orthostasis in the past. Rare elevated SBP. Since starting sinemet for parkinson's, sometimes has better days and worse days with her lightheadedness.  Reported occasional irregular beats during her virtual visit in May. Occurs rarely. Doesn't use BP machine regularly at home any more, had seen about once every two weeks that she would have a notice that there is an irregular beat. She was asymptomatic at the time on her cuff. She has long term issues with feeling her heart racing, though her heart rate is usually in the 80s on her fitbit. After yard work, she feels like her heart is  racing. Sometimes sees HR in the 120s then. Returns to normal after rest. Reviewed exercise recommendations, discussed 220-age. Only with exertion, no resting palpitations/tachycardia.   Denies chest pain, shortness of breath at rest or with normal exertion. No PND, orthopnea, LE edema or unexpected weight gain. No syncope.   Past Medical History:  Diagnosis Date  . Adenomatous colon polyp 1990  . Allergy   . Anxiety 12/15/2013  . Arthritis   . Cataract   . Chronic constipation   . Depression 11/16/2012  . Diverticulitis   . Diverticulosis   . 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 External hemorrhoids   . Fibromyalgia with myofascial pain 11/16/2012  . Headaches, cluster   . Hypercholesterolemia 12/15/2013  . Hypertension   . Insomnia 11/16/2012  . Myalgia and myositis, unspecified   . Other disorder of muscle, ligament, and fascia    eosinophilic fascitis  . Other specified disease of hair and hair follicles   . Pain in joint, multiple sites   . Restless leg syndrome 12/15/2013  . Unspecified sleep apnea   . Urine incontinence     Past Surgical History:  Procedure Laterality Date  . ABDOMINOPLASTY  2006   "tummy tuck"  . BREAST REDUCTION SURGERY  1995  . BREAST SURGERY  1981   left breast biopsy  . CATARACT EXTRACTION, BILATERAL    . MUSCLE BIOPSY  2009   DEEP TISSUE  . REDUCTION MAMMAPLASTY    .  REFRACTIVE SURGERY    . TONSILLECTOMY    . TOTAL ABDOMINAL HYSTERECTOMY  1999  . WISDOM TOOTH EXTRACTION      Current Medications: Current Outpatient Medications on File Prior to Visit  Medication Sig  . atorvastatin (LIPITOR) 40 MG tablet Take 1 tablet (40 mg total) by mouth daily.  . B Complex-C (SUPER B COMPLEX) TABS Take 1 tablet by mouth daily.   . Calcium Citrate-Vitamin D (EQ CALCIUM CITRATE+D3 PO)   . carbidopa-levodopa (SINEMET IR) 25-100 MG tablet Take 1 tablet by mouth 3 (three) times daily before meals.  (Patient taking differently: Take 1 tablet by mouth 3 (three) times daily before meals. Has not started yet)  . clonazePAM (KLONOPIN) 0.5 MG tablet 1  qhs  . Coenzyme Q10 (COQ-10) 100 MG CAPS Take 1 tablet by mouth daily.   . cyclobenzaprine (FLEXERIL) 5 MG tablet TAKE 1 TABLET BY MOUTH TWICE DAILY AS NEEDED FOR MUSCLE SPASM  . DHEA 50 MG TABS   . FLUoxetine (PROZAC) 20 MG capsule Take 1 capsule (20 mg total) by mouth daily.  . folic acid (FOLVITE) 800 MCG tablet Take 400 mcg by mouth daily.  Marland Kitchen LINZESS 290 MCG CAPS capsule TAKE 1 CAPSULE BY MOUTH ONCE DAILY  . magnesium 30 MG tablet Take 30 mg by mouth 2 (two) times daily.  . Multiple Vitamins-Minerals (MULTIVITAMIN WITH MINERALS) tablet Take 1 tablet by mouth daily.    Marland Kitchen rOPINIRole (REQUIP) 0.25 MG tablet Take 1-2 tablets (0.25-0.5 mg total) by mouth 2 (two) times daily.  . traZODone (DESYREL) 50 MG tablet Take 1 tablet (50 mg total) by mouth at bedtime. (Patient taking differently: Take 25 mg by mouth at bedtime. )  . TURMERIC CURCUMIN PO   . vitamin C (ASCORBIC ACID) 500 MG tablet    Current Facility-Administered Medications on File Prior to Visit  Medication  . 0.9 %  sodium chloride infusion     Allergies:   Crestor [rosuvastatin calcium], Cymbalta [duloxetine hcl], Effexor xr [venlafaxine hcl er], Morphine and related, Pravastatin, Promethazine-codeine, and Rizatriptan   Social History   Socioeconomic History  . Marital status: Divorced    Spouse name: Not on file  . Number of children: 2  . Years of education: Not on file  . Highest education level: Not on file  Occupational History  . Occupation: Retired    Associate Professor: RETIRED    CommentEquities trader  Social Needs  . Financial resource strain: Not on file  . Food insecurity    Worry: Not on file    Inability: Not on file  . Transportation needs    Medical: Not on file    Non-medical: Not on file  Tobacco Use  . Smoking status: Former Smoker    Quit date:  12/19/1986    Years since quitting: 32.4  . Smokeless tobacco: Never Used  Substance and Sexual Activity  . Alcohol use: Yes    Alcohol/week: 1.0 standard drinks    Types: 1 Glasses of wine per week    Comment: 2-3 x year  . Drug use: No  . Sexual activity: Yes    Birth control/protection: Surgical, Post-menopausal  Lifestyle  . Physical activity    Days per week: Not on file    Minutes per session: Not on file  . Stress: Not on file  Relationships  . Social Musician on phone: Not on file    Gets together: Not on file    Attends religious service:  Not on file    Active member of club or organization: Not on file    Attends meetings of clubs or organizations: Not on file    Relationship status: Not on file  Other Topics Concern  . Not on file  Social History Narrative   Lives alone.  Retired.     Education- 2 years of college   2 daughters   caffeine daily 2-3 cups     Family History: The patient's family history includes Breast cancer in her paternal grandmother; COPD in her brother and sister; Colon cancer (age of onset: 80) in her father; Colon polyps in her mother and paternal grandfather; Emphysema in her brother and sister; Heart attack in her mother; Heart disease in her father, mother, paternal grandfather, and another family member; Lung cancer in her brother; Parkinson's disease in an other family member. There is no history of Stomach cancer, Rectal cancer, or Esophageal cancer.  ROS:   Please see the history of present illness.  Additional pertinent ROS: Constitutional: Negative for chills, fever, night sweats, unintentional weight loss  HENT: Negative for ear pain and hearing loss.   Eyes: Negative for loss of vision and eye pain.  Respiratory: Negative for sputum, wheezing.  Positive for chronic dry cough. Cardiovascular: See HPI. Gastrointestinal: Negative for abdominal pain, melena, and hematochezia (has rarely with constipation). Is on linzess for  constipation. Genitourinary: Negative for dysuria and hematuria.  Musculoskeletal: Negative for falls. Positive for tremors. Skin: Negative for itching and rash.  Neurological: Negative for loss of consciousness.  Endo/Heme/Allergies: Does bruise/bleed easily.   EKGs/Labs/Other Studies Reviewed:    The following studies were reviewed today: Recent PCP notes  EKG:  EKG is personally reviewed.  The ekg ordered previously demonstrates normal sinus rhythm  Recent Labs: 02/18/2019: ALT 11; BUN 9; Creatinine, Ser 0.75; Hemoglobin 13.5; Platelets 219; Potassium 4.1; Sodium 140; TSH 2.440  Recent Lipid Panel    Component Value Date/Time   CHOL 246 (H) 02/18/2019 0910   TRIG 91 02/18/2019 0910   HDL 84 02/18/2019 0910   CHOLHDL 2.9 02/18/2019 0910   CHOLHDL 3 03/18/2018 0814   VLDL 11.4 03/18/2018 0814   LDLCALC 144 (H) 02/18/2019 0910  lipids were before restarting rosuvastatin 5 mg daily  Physical Exam:    VS:  BP 106/64   Pulse 93   Temp 98.2 F (36.8 C)   Ht 5' 1.5" (1.562 m)   Wt 127 lb 6.4 oz (57.8 kg)   SpO2 95%   BMI 23.68 kg/m     Wt Readings from Last 3 Encounters:  05/16/19 127 lb 6.4 oz (57.8 kg)  02/16/19 125 lb (56.7 kg)  11/01/18 127 lb (57.6 kg)    GEN: Well nourished, well developed in no acute distress HEENT: Normal, moist mucous membranes NECK: No JVD CARDIAC: regular rhythm, normal S1 and S2, no murmurs, rubs, gallops.  VASCULAR: Radial and DP pulses 2+ bilaterally. No carotid bruits RESPIRATORY:  Clear to auscultation without rales, wheezing or rhonchi  ABDOMEN: Soft, non-tender, non-distended MUSCULOSKELETAL:  Ambulates independently SKIN: Warm and dry, no edema NEUROLOGIC:  Alert and oriented x 3. No focal neuro deficits noted. Tremor not well appreciated today. PSYCHIATRIC:  Normal affect   ASSESSMENT:    1. Dyslipidemia   2. Labile blood pressure   3. Premature beat   4. Cardiac risk counseling   5. Counseling on health promotion and  disease prevention    PLAN:    History of orthostatic hypotension, with rare  occasional hypertension: Had been labile, now normalized, within goal today. -was taking routinely after visit in 02/2019 with Joni Reining, now taking less frequently but has been normal -no orthostasis on prior exam. Did note that this can be part of the Parkinson's syndrome. She will monitor.  Hyperlipidemia: LDL 144 in 02/2019. Started on atorvastatin, has been taking consistently for just over one month. Denies symptoms -check lipids, LFTs today  Occasional irregular heart beat on monitor: rare, asymptomatic. Regular on exam today. -continue to monitor, if becomes more frequent would pursue event monitor.  CV risk counseling and primary prevention: -recommend heart healthy/Mediterranean diet, with whole grains, fruits, vegetable, fish, lean meats, nuts, and olive oil. Limit salt. -recommend moderate walking, 3-5 times/week for 30-50 minutes each session. Aim for at least 150 minutes.week. Goal should be pace of 3 miles/hours, or walking 1.5 miles in 30 minutes -recommend avoidance of tobacco products. Avoid excess alcohol. -Additional risk factor control:  -Diabetes: A1c is 5.0  -Lipids: as above  -Blood pressure control: as above  -Weight: BMI 23, at goal -ASCVD risk score: The 10-year ASCVD risk score Denman George DC Montez Hageman., et al., 2013) is: 11.7%   Values used to calculate the score:     Age: 36 years     Sex: Female     Is Non-Hispanic African American: No     Diabetic: No     Tobacco smoker: No     Systolic Blood Pressure: 106 mmHg     Is BP treated: No     HDL Cholesterol: 84 mg/dL     Total Cholesterol: 246 mg/dL   Plan for follow up: 1 year or sooner PRN  TIME SPENT WITH PATIENT: 30 minutes of direct patient care. More than 50% of that time was spent on coordination of care and counseling regarding lipid management, CV risk, relationship of CV symptoms to Parkinson's disease.  Medication  Adjustments/Labs and Tests Ordered: Current medicines are reviewed at length with the patient today.  Concerns regarding medicines are outlined above.  No orders of the defined types were placed in this encounter.  No orders of the defined types were placed in this encounter.   Patient Instructions  Medication Instructions:  Your Physician recommend you continue on your current medication as directed.    If you need a refill on your cardiac medications before your next appointment, please call your pharmacy.   Lab work: Your physician recommends that you return for lab work today (lipid, LFT)   Testing/Procedures: None  Follow-Up: At [facility], you and your health needs are our priority.  As part of our continuing mission to provide you with exceptional heart care, we have created designated Provider Care Teams.  These Care Teams include your primary Cardiologist (physician) and Advanced Practice Providers (APPs -  Physician Assistants and Nurse Practitioners) who all work together to provide you with the care you need, when you need it. You will need a follow up appointment in 1 years.  Please call our office 2 months in advance to schedule this appointment.  You may see Jodelle Red, MD or one of the following Advanced Practice Providers on your designated Care Team:   Theodore Demark, PA-C . Joni Reining, DNP, ANP        Signed, Jodelle Red, MD PhD 05/16/2019 12:31 PM    [facility]

## 2019-05-22 ENCOUNTER — Other Ambulatory Visit: Payer: Self-pay | Admitting: Adult Health

## 2019-05-23 ENCOUNTER — Other Ambulatory Visit: Payer: Self-pay | Admitting: *Deleted

## 2019-05-23 MED ORDER — ATORVASTATIN CALCIUM 40 MG PO TABS: 40.0000 mg | ORAL_TABLET | Freq: Every day | ORAL | 7 refills | Status: DC

## 2019-06-08 ENCOUNTER — Encounter: Payer: Self-pay | Admitting: Family Medicine

## 2019-06-13 ENCOUNTER — Telehealth: Payer: Self-pay | Admitting: Family Medicine

## 2019-06-13 NOTE — Telephone Encounter (Signed)
I called the pt and informed her per Arbie Cookey we do accept [facility].  Patient stated she is aware of this and stated if the doctor bills under her own name the claim would not be covered and if she bills under [facility] or [facility] it would be covered.  I offered to give her the billing number for [facility] and she declined.

## 2019-06-13 NOTE — Telephone Encounter (Signed)
Copied from [place] [id number]. Topic: General - Other >> Jun 13, 2019  2:45 PM Keene Breath wrote: Reason for CRM: Patient called to ask the nurse to call regarding her doctor Jerilee Hoh and whether she is covered under the insurance plan.  Please advise and call patient to discuss at [phone]

## 2019-06-17 DIAGNOSIS — R69 Illness, unspecified: Secondary | ICD-10-CM | POA: Diagnosis not present

## 2019-06-18 ENCOUNTER — Other Ambulatory Visit: Payer: Self-pay | Admitting: Family Medicine

## 2019-06-21 ENCOUNTER — Encounter: Payer: Self-pay | Admitting: Family Medicine

## 2019-06-21 ENCOUNTER — Other Ambulatory Visit: Payer: Self-pay | Admitting: Family Medicine

## 2019-06-21 ENCOUNTER — Ambulatory Visit: Payer: Medicare HMO | Admitting: Family Medicine

## 2019-06-21 ENCOUNTER — Other Ambulatory Visit: Payer: Self-pay

## 2019-06-21 VITALS — BP 116/76 | HR 81 | Temp 97.5°F | Ht 61.5 in | Wt 127.6 lb

## 2019-06-21 DIAGNOSIS — M436 Torticollis: Secondary | ICD-10-CM

## 2019-06-21 DIAGNOSIS — G2581 Restless legs syndrome: Secondary | ICD-10-CM

## 2019-06-21 DIAGNOSIS — G20 Parkinson's disease: Secondary | ICD-10-CM

## 2019-06-21 NOTE — Patient Instructions (Addendum)
We will restart Sinemet at 1/2 tablet twice daily for 2 weeks, call or send message on MyChart with progress report.   Take cyclobenzaprine for muscle tension/neck stiffness  Continue activity as tolerated.   Follow up in 6 months  Parkinson's Disease Parkinson's disease causes problems with movements. It is a long-term condition. It gets worse over time (is progressive). It affects each person in different ways. It makes it harder for you to:  Control how your body moves.  Move your body normally. The condition can range from mild to very bad (advanced). What are the causes? This condition results from a loss of brain cells called neurons. These brain cells make a chemical called dopamine, which is needed to control body movement. As the condition gets worse, the brain cells make less dopamine. This makes it hard to move or control your movements. The exact cause of this condition is not known. What increases the risk?  Being female.  Being age 55 or older.  Having family members who had Parkinson's disease.  Having had an injury to the brain.  Being very sad (depressed).  Being around things that are harmful or poisonous. What are the signs or symptoms? Symptoms of this condition can vary. The main symptoms have to do with movement. These include:  A tremor or shaking while you are resting that you cannot control.  Stiffness in your neck, arms, and legs.  Slowing of movement. This may include: ? Losing expressions of the face. ? Having trouble making small movements that are needed to button your clothing or brush your teeth.  Walking in a way that is not normal. You may walk with short, shuffling steps.  Loss of balance when standing. You may sway, fall backward, or have trouble making turns. Other symptoms include:  Being very sad, worried, or confused.  Seeing or hearing things that are not real.  Losing thinking abilities (dementia).  Trouble speaking or  swallowing.  Having a hard time pooping (constipation).  Needing to pee right away, peeing often, or not being able to control when you pee or poop.  Sleep problems. How is this treated? There is no cure. The goal of treatment is to manage your symptoms. Treatment may include:  Medicines.  Therapy to help with talking or movement.  Surgery to reduce shaking and other movements that you cannot control. Follow these instructions at home: Medicines  Take over-the-counter and prescription medicines only as told by your doctor.  Avoid taking pain or sleeping medicines. Eating and drinking  Follow instructions from your doctor about what you cannot eat or drink.  Do not drink alcohol. Activity  Talk with your doctor about if it is safe for you to drive.  Do exercises as told by your doctor. Lifestyle      Put in grab bars and railings in your home. These help to prevent falls.  Do not use any products that contain nicotine or tobacco, such as cigarettes, e-cigarettes, and chewing tobacco. If you need help quitting, ask your doctor.  Join a support group. General instructions  Talk with your doctor about what you need help with and what your safety needs are.  Keep all follow-up visits as told by your doctor, including any therapy visits to help with talking or moving. This is important. Contact a doctor if:  Medicines do not help your symptoms.  You feel off-balance.  You fall at home.  You need more help at home.  You have trouble swallowing.  You have a very hard time pooping.  You have a lot of side effects from your medicines.  You feel very sad, worried, or confused. Get help right away if:  You were hurt in a fall.  You see or hear things that are not real.  You cannot swallow without choking.  You have chest pain or trouble breathing.  You do not feel safe at home.  You have thoughts about hurting yourself or others. If you ever feel like  you may hurt yourself or others, or have thoughts about taking your own life, get help right away. You can go to your nearest emergency department or call:  Your local emergency services (911 in the [place]  A suicide crisis helpline, such as the [place] at (640)266-1941. This is open 24 hours a day. Summary  This condition causes problems with movements.  It is a long-term condition. It gets worse over time.  There is no cure. Treatment focuses on managing your symptoms.  Talk with your doctor about what you need help with and what your safety needs are.  Keep all follow-up visits as told by your doctor. This is important. This information is not intended to replace advice given to you by your health care provider. Make sure you discuss any questions you have with your health care provider. Document Released: 12/22/2011 Document Revised: 12/16/2018 Document Reviewed: 12/16/2018 Elsevier Patient Education  2020 [employer].

## 2019-06-21 NOTE — Progress Notes (Signed)
PATIENT: Shannon Sutton DOB: February 08, 1946  REASON FOR VISIT: follow up HISTORY FROM: patient  Chief Complaint  Patient presents with  . Follow-up    Rm 8, alone (did drive here by herself).   . parksinsonism    still dizzy, sinemet (tried twice- and had prickly feeling) not taking now.     HISTORY OF PRESENT ILLNESS: Today 06/21/19 Shannon Sutton is a 73 y.o. female here today for follow up of parkinsonism. DAT scan concerning for parkinson pathology in 12/2018. She was started on carbadopa/levodopa in May. She took a whole tablet the first day, then tried 1/2 tablet twice daily for a couple days but did not like how she felt and has not taken it since. She feels that she did not give this a fair trial. She does have occasional tremor, worse at night. She also suffers from RLS. She continues ropinirole 0.50mg  in am and 0.5mg  in the evenings. Occasionally she takes an extra dose in the middle of the night. She is tolerating this well. She continues to have dizziness that started 2 years ago. Dizziness is described as "being off kilter." She does feel off balance. It is worse after reading or focusing. She is seeing ophthalmology but was told that they would probably never get the correct prescription for her. She feels tired and weak most days. She feels that she is moving slower than normal. She has tension and tightness all over. She was recently prescribed cyclobenzaprine 5mg  twice daily but only takes this at night on occasion. She is walking on treadmill three times a week and doing light weight lifting. She does note shuffling in the early morning hours but feels this improved throughout the day. She does not use cane or other assistive devices. She is not interested in PT at this time. No falls. She stays well hydrated.    HISTORY: (copied from Dr Richrd Humbles note on 11/01/2018)  UPDATE (11/01/2018, VRP): She continues to have blurred vision, headaches and tremor of hands. Blurred vision  is worse with bright lights. She is having right sided temporal and occipital headaches. They are throbbing with photosensitivity and nausea. She kept BP log and readings were normal. No migraine diary. Resting tremor is worse in the evening. L> R hand. PT in 08/2018 did not help much. She is going to GYM 3 times a week, both cardio and light weights. She continues to [employer] about being forgetful. She is constantly hoarse and has intermittent trouble swallowing saliva. No trouble swallowing food or liquids. No falls. She is taking ropinirole 0.25mg  in the am and 0.50 in the pm. She is tolerating well. She did not increase dose due to dizziness. She feels that symptoms are no worse since last visit in 08/2018 but no better.   UPDATE (08/27/18, VRP): Since last visit, symptoms are progressing.  Now patient having more problems with lightheadedness, dizziness, gait and balance difficulty, tremor, fatigue.  Also with more confusion, difficulty swallowing, swallowing difficulty hoarse voice, difficulty with handwriting.  Also reports increasing photosensitivity and blurred vision.  She does have history of headaches since age 88 years old with right temporal throbbing painful sensation associate with photophobia and nausea.  PRIOR HPI (12/02/17): 73 year old right-handed female here for evaluation of tremor, weakness, blurred vision, headaches, fatigue.  Evaluate for Parkinson symptoms.  Patient reports intermittent weakness and shaky sensation with heart racing feeling since February 2018.  Patient also having some intermittent tremor, mainly with certain postures and actions, mainly in  her left hand, dating back to January 2017.  She had evaluation with movement disorder specialist Dr. Arbutus Leas in 2017, who felt that patient did not have Parkinson's disease at that time.  Long history of loss of smell.  Patient feels like her handwriting is worsening.  Patient also concerned about her family history of  Parkinson's disease in her paternal grandfather and maternal aunt.  Patient also having issues with depression, confusion, memory loss, balance problems.  Patient has intermittent headaches with pressure sensation.  No nausea or vomiting.  No photophobia or phonophobia.  Patient has been struggling with her vision and has gone through "$2000 worth" of different glasses and eye exams without benefit in her vision.    REVIEW OF SYSTEMS: Out of a complete 14 system review of symptoms, the patient complains only of the following symptoms, dizziness, lightheadedness, muscle tension, slow movements, gait difficulty and all other reviewed systems are negative.   ALLERGIES: Allergies  Allergen Reactions  . Crestor [Rosuvastatin Calcium]     abd pain  . Cymbalta [Duloxetine Hcl]     Nausea   . Effexor Xr [Venlafaxine Hcl Er]     dizziness  . Morphine And Related Itching    agitation  . Pravastatin     abd pain  . Promethazine-Codeine Itching    Itching in face  . Rizatriptan Other (See Comments)    Tingling, prickly sensation, feelings of palpitations, flushing.     HOME MEDICATIONS: Outpatient Medications Prior to Visit  Medication Sig Dispense Refill  . atorvastatin (LIPITOR) 40 MG tablet Take 1 tablet (40 mg total) by mouth daily. 30 tablet 7  . B Complex-C (SUPER B COMPLEX) TABS Take 1 tablet by mouth daily.     . Calcium Citrate-Vitamin D (EQ CALCIUM CITRATE+D3 PO)     . clonazePAM (KLONOPIN) 0.5 MG tablet 1  qhs 30 tablet 4  . Coenzyme Q10 (COQ-10) 100 MG CAPS Take 1 tablet by mouth daily.     Marland Kitchen DHEA 50 MG TABS     . FLUoxetine (PROZAC) 20 MG capsule Take 1 capsule (20 mg total) by mouth daily. 90 capsule 1  . folic acid (FOLVITE) 800 MCG tablet Take 400 mcg by mouth daily.    Marland Kitchen LINZESS 290 MCG CAPS capsule TAKE 1 CAPSULE BY MOUTH ONCE DAILY 90 capsule 3  . magnesium 30 MG tablet Take 30 mg by mouth 2 (two) times daily.    . Multiple Vitamins-Minerals (MULTIVITAMIN WITH  MINERALS) tablet Take 1 tablet by mouth daily.      Marland Kitchen rOPINIRole (REQUIP) 0.25 MG tablet Take 1-2 tablets (0.25-0.5 mg total) by mouth 2 (two) times daily. 120 tablet 6  . traZODone (DESYREL) 50 MG tablet Take 1 tablet (50 mg total) by mouth at bedtime. (Patient taking differently: Take 25 mg by mouth at bedtime. ) 90 tablet 3  . TURMERIC CURCUMIN PO     . vitamin C (ASCORBIC ACID) 500 MG tablet     . cyclobenzaprine (FLEXERIL) 5 MG tablet TAKE 1 TABLET BY MOUTH TWICE DAILY AS NEEDED FOR MUSCLE SPASM 30 tablet 0  . carbidopa-levodopa (SINEMET IR) 25-100 MG tablet Take 1 tablet by mouth 3 (three) times daily before meals. (Patient not taking: Reported on 06/21/2019) 90 tablet 12   Facility-Administered Medications Prior to Visit  Medication Dose Route Frequency Provider Last Rate Last Dose  . 0.9 %  sodium chloride infusion  500 mL Intravenous Continuous Danis, Andreas Blower, MD  PAST MEDICAL HISTORY: Past Medical History:  Diagnosis Date  . Adenomatous colon polyp 1990  . Allergy   . Anxiety 12/15/2013  . Arthritis   . Cataract   . Chronic constipation   . Depression 11/16/2012  . Diverticulitis   . Diverticulosis   . 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 External hemorrhoids   . Fibromyalgia with myofascial pain 11/16/2012  . Headaches, cluster   . Hypercholesterolemia 12/15/2013  . Hypertension   . Insomnia 11/16/2012  . Myalgia and myositis, unspecified   . Other disorder of muscle, ligament, and fascia    eosinophilic fascitis  . Other specified disease of hair and hair follicles   . Pain in joint, multiple sites   . Restless leg syndrome 12/15/2013  . Unspecified sleep apnea   . Urine incontinence     PAST SURGICAL HISTORY: Past Surgical History:  Procedure Laterality Date  . ABDOMINOPLASTY  2006   "tummy tuck"  . BREAST REDUCTION SURGERY  1995  . BREAST SURGERY  1981   left breast biopsy  . CATARACT  EXTRACTION, BILATERAL    . MUSCLE BIOPSY  2009   DEEP TISSUE  . REDUCTION MAMMAPLASTY    . REFRACTIVE SURGERY    . TONSILLECTOMY    . TOTAL ABDOMINAL HYSTERECTOMY  1999  . WISDOM TOOTH EXTRACTION      FAMILY HISTORY: Family History  Problem Relation Age of Onset  . Colon cancer Father 93  . Heart disease Father   . Colon polyps Mother   . Heart disease Mother   . Heart attack Mother   . COPD Sister   . Emphysema Sister   . COPD Brother   . Emphysema Brother   . Lung cancer Brother   . Breast cancer Paternal Grandmother   . Heart disease Other        family history  . Parkinson's disease Other   . Heart disease Paternal Grandfather   . Colon polyps Paternal Grandfather   . Stomach cancer Neg Hx   . Rectal cancer Neg Hx   . Esophageal cancer Neg Hx     SOCIAL HISTORY: Social History   Socioeconomic History  . Marital status: Divorced    Spouse name: Not on file  . Number of children: 2  . Years of education: Not on file  . Highest education level: Not on file  Occupational History  . Occupation: Retired    Associate Professor: RETIRED    CommentEquities trader  Social Needs  . Financial resource strain: Not on file  . Food insecurity    Worry: Not on file    Inability: Not on file  . Transportation needs    Medical: Not on file    Non-medical: Not on file  Tobacco Use  . Smoking status: Former Smoker    Quit date: 12/19/1986    Years since quitting: 32.5  . Smokeless tobacco: Never Used  Substance and Sexual Activity  . Alcohol use: Yes    Alcohol/week: 1.0 standard drinks    Types: 1 Glasses of wine per week    Comment: 2-3 x year  . Drug use: No  . Sexual activity: Yes    Birth control/protection: Surgical, Post-menopausal  Lifestyle  . Physical activity    Days per week: Not on file    Minutes per session: Not on file  . Stress: Not on file  Relationships  . Social  connections    Talks on phone: Not on file    Gets together: Not on file     Attends religious service: Not on file    Active member of club or organization: Not on file    Attends meetings of clubs or organizations: Not on file    Relationship status: Not on file  . Intimate partner violence    Fear of current or ex partner: Not on file    Emotionally abused: Not on file    Physically abused: Not on file    Forced sexual activity: Not on file  Other Topics Concern  . Not on file  Social History Narrative   Lives alone.  Retired.     Education- 2 years of college   2 daughters   caffeine daily 2-3 cups      PHYSICAL EXAM  Vitals:   06/21/19 1313  BP: 116/76  Pulse: 81  Temp: (!) 97.5 F (36.4 C)  SpO2: 98%  Weight: 127 lb 9.6 oz (57.9 kg)  Height: 5' 1.5" (1.562 m)   Body mass index is 23.72 kg/m.  Generalized: Well developed, in no acute distress Cardiology: normal rate and rhythm, no murmur noted Neurological examination  Mentation: Alert oriented to time, place, history taking. Follows all commands speech and language fluent Cranial nerve II-XII: Pupils were equal round reactive to light. Extraocular movements were full, visual field were full on confrontational test. Facial sensation and strength were normal. Uvula tongue midline. Head turning and shoulder shrug  were normal and symmetric. Motor: The motor testing reveals 5 over 5 strength of all 4 extremities. Good symmetric motor tone is noted throughout. Bradykinesia with finger taps, dysrhythmic toe taps, no cogwheel rigidity noted. Sensory: Sensory testing is intact to soft touch on all 4 extremities. No evidence of extinction is noted.  Coordination: Cerebellar testing reveals slow finger-nose-finger and heel-to-shin bilaterally.  Gait and station: Gait is short, tandem gait unsteady, Romberg negative.   DIAGNOSTIC DATA (LABS, IMAGING, TESTING) - I reviewed patient records, labs, notes, testing and imaging myself where available.  No flowsheet data found.   Lab Results  Component  Value Date   WBC 3.9 02/18/2019   HGB 13.5 02/18/2019   HCT 38.1 02/18/2019   MCV 90 02/18/2019   PLT 219 02/18/2019      Component Value Date/Time   NA 140 02/18/2019 0910   K 4.1 02/18/2019 0910   CL 100 02/18/2019 0910   CO2 24 02/18/2019 0910   GLUCOSE 86 02/18/2019 0910   GLUCOSE 97 03/18/2018 0814   BUN 9 02/18/2019 0910   CREATININE 0.75 02/18/2019 0910   CALCIUM 9.8 02/18/2019 0910   PROT 6.8 05/16/2019 1147   ALBUMIN 5.0 (H) 05/16/2019 1147   AST 29 05/16/2019 1147   ALT 29 05/16/2019 1147   ALKPHOS 125 (H) 05/16/2019 1147   BILITOT 0.4 05/16/2019 1147   GFRNONAA 78 02/18/2019 0910   GFRAA 90 02/18/2019 0910   Lab Results  Component Value Date   CHOL 187 05/16/2019   HDL 94 05/16/2019   LDLCALC 84 05/16/2019   TRIG 47 05/16/2019   CHOLHDL 2.0 05/16/2019   Lab Results  Component Value Date   HGBA1C 5.0 02/18/2019   Lab Results  Component Value Date   VITAMINB12 837 05/29/2016   Lab Results  Component Value Date   TSH 2.440 02/18/2019    ASSESSMENT AND PLAN 73 y.o. year old female  has a past medical history of Adenomatous colon polyp (  1990), Allergy, Anxiety (12/15/2013), Arthritis, Cataract, Chronic constipation, Depression (11/16/2012), Diverticulitis, Diverticulosis, Eosinophilic fasciitis (11/16/2012), External hemorrhoids, Fibromyalgia with myofascial pain (11/16/2012), Headaches, cluster, Hypercholesterolemia (12/15/2013), Hypertension, Insomnia (11/16/2012), Myalgia and myositis, unspecified, Other disorder of muscle, ligament, and fascia, Other specified disease of hair and hair follicles, Pain in joint, multiple sites, Restless leg syndrome (12/15/2013), Unspecified sleep apnea, and Urine incontinence. here with     ICD-10-CM   1. Parkinsonism, unspecified Parkinsonism type (HCC)  G20   2. Neck stiffness  M43.6   3. Restless leg syndrome  G25.81     Marriana continues to demonstrate concerning symptoms of Parkinson's Disease. She is willing to restart  carbidopa/levodopa. We will start 1/2 tablet twice daily. She will try this for 2 weeks, then call with progress report. We will plan to increase dose as tolerated to 1 tablet three times daily. Side effects discussed. She will continue ropinirole 0.5mg  twice daily for RLS. She will also start cyclobenzaprine 5mg  twice daily as needed for muscle tension and feeling of tightness. I have encouraged continued activity. Will order PT if she changes her mind. We will follow up in 6 months, sooner if needed. She verbalizes understanding and agreement with this plan.    No orders of the defined types were placed in this encounter.    No orders of the defined types were placed in this encounter.     I spent 15 minutes with the patient. 50% of this time was spent counseling and educating patient on plan of care and medications.    Shawnie Dapper, FNP-C 06/21/2019, 4:34 PM [facility] [address] [place], [place] [address] [phone]

## 2019-06-24 NOTE — Progress Notes (Signed)
I reviewed note and agree with plan.   Penni Bombard, MD [id number], 0000000 AM Certified in Neurology, Neurophysiology and Neuroimaging  [facility] [address], [place], [place] [address] [phone]

## 2019-06-27 ENCOUNTER — Encounter: Payer: Self-pay | Admitting: Family Medicine

## 2019-06-30 ENCOUNTER — Ambulatory Visit (INDEPENDENT_AMBULATORY_CARE_PROVIDER_SITE_OTHER): Payer: Medicare HMO | Admitting: Internal Medicine

## 2019-06-30 ENCOUNTER — Encounter: Payer: Self-pay | Admitting: Internal Medicine

## 2019-06-30 VITALS — BP 110/70 | HR 89 | Temp 97.6°F | Wt 128.3 lb

## 2019-06-30 DIAGNOSIS — E785 Hyperlipidemia, unspecified: Secondary | ICD-10-CM

## 2019-06-30 DIAGNOSIS — K5901 Slow transit constipation: Secondary | ICD-10-CM | POA: Diagnosis not present

## 2019-06-30 DIAGNOSIS — F419 Anxiety disorder, unspecified: Secondary | ICD-10-CM | POA: Diagnosis not present

## 2019-06-30 DIAGNOSIS — M797 Fibromyalgia: Secondary | ICD-10-CM

## 2019-06-30 DIAGNOSIS — G4700 Insomnia, unspecified: Secondary | ICD-10-CM

## 2019-06-30 DIAGNOSIS — G2581 Restless legs syndrome: Secondary | ICD-10-CM | POA: Diagnosis not present

## 2019-06-30 DIAGNOSIS — F329 Major depressive disorder, single episode, unspecified: Secondary | ICD-10-CM | POA: Diagnosis not present

## 2019-06-30 DIAGNOSIS — R69 Illness, unspecified: Secondary | ICD-10-CM | POA: Diagnosis not present

## 2019-06-30 MED ORDER — LINACLOTIDE 290 MCG PO CAPS: 290.0000 ug | ORAL_CAPSULE | Freq: Every day | ORAL | 1 refills | Status: DC

## 2019-06-30 NOTE — Progress Notes (Signed)
Established Patient Office Visit     CC/Reason for Visit: Establish care, discuss chronic conditions, medication refills  HPI: Shannon Sutton is a 73 y.o. female who is coming in today for the above mentioned reasons. Past Medical History is significant for: Hyperlipidemia well-controlled on a statin, history of depression and anxiety on fluoxetine and clonazepam prescribed by psychiatry, history of parkinsonism and restless leg syndrome followed by neurology on Requip and Sinemet.  She also has a history of chronic constipation for which she takes Linzess and needs a refill of this today.  Fibromyalgia.  She is here today mainly to establish care as her prior PCP is retiring.  She has not had a physical in a few years.  She has been having some issues with urinary incontinence.   Past Medical/Surgical History: Past Medical History:  Diagnosis Date  . Adenomatous colon polyp 1990  . Allergy   . Anxiety 12/15/2013  . Arthritis   . Cataract   . Chronic constipation   . Depression 11/16/2012  . Diverticulitis   . Diverticulosis   . 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 External hemorrhoids   . Fibromyalgia with myofascial pain 11/16/2012  . Headaches, cluster   . Hypercholesterolemia 12/15/2013  . Hypertension   . Insomnia 11/16/2012  . Myalgia and myositis, unspecified   . Other disorder of muscle, ligament, and fascia    eosinophilic fascitis  . Other specified disease of hair and hair follicles   . Pain in joint, multiple sites   . Restless leg syndrome 12/15/2013  . Unspecified sleep apnea   . Urine incontinence     Past Surgical History:  Procedure Laterality Date  . ABDOMINOPLASTY  2006   "tummy tuck"  . BREAST REDUCTION SURGERY  1995  . BREAST SURGERY  1981   left breast biopsy  . CATARACT EXTRACTION, BILATERAL    . MUSCLE BIOPSY  2009   DEEP TISSUE  . REDUCTION MAMMAPLASTY    . REFRACTIVE SURGERY     . TONSILLECTOMY    . TOTAL ABDOMINAL HYSTERECTOMY  1999  . WISDOM TOOTH EXTRACTION      Social History:  reports that she quit smoking about 32 years ago. She has never used smokeless tobacco. She reports current alcohol use of about 1.0 standard drinks of alcohol per week. She reports that she does not use drugs.  Allergies: Allergies  Allergen Reactions  . Crestor [Rosuvastatin Calcium]     abd pain  . Cymbalta [Duloxetine Hcl]     Nausea   . Effexor Xr [Venlafaxine Hcl Er]     dizziness  . Morphine And Related Itching    agitation  . Pravastatin     abd pain  . Promethazine-Codeine Itching    Itching in face  . Rizatriptan Other (See Comments)    Tingling, prickly sensation, feelings of palpitations, flushing.     Family History:  Family History  Problem Relation Age of Onset  . Colon cancer Father 46  . Heart disease Father   . Colon polyps Mother   . Heart disease Mother   . Heart attack Mother   . COPD Sister   . Emphysema Sister   . COPD Brother   . Emphysema Brother   . Lung cancer Brother   . Breast cancer Paternal Grandmother   . Heart disease Other        family history  .  Parkinson's disease Other   . Heart disease Paternal Grandfather   . Colon polyps Paternal Grandfather   . Stomach cancer Neg Hx   . Rectal cancer Neg Hx   . Esophageal cancer Neg Hx      Current Outpatient Medications:  .  atorvastatin (LIPITOR) 40 MG tablet, Take 1 tablet (40 mg total) by mouth daily., Disp: 30 tablet, Rfl: 7 .  B Complex-C (SUPER B COMPLEX) TABS, Take 1 tablet by mouth daily. , Disp: , Rfl:  .  Calcium Citrate-Vitamin D (EQ CALCIUM CITRATE+D3 PO), , Disp: , Rfl:  .  carbidopa-levodopa (SINEMET IR) 25-100 MG tablet, Take 1 tablet by mouth 3 (three) times daily before meals., Disp: 90 tablet, Rfl: 12 .  clonazePAM (KLONOPIN) 0.5 MG tablet, 1  qhs, Disp: 30 tablet, Rfl: 4 .  Coenzyme Q10 (COQ-10) 100 MG CAPS, Take 1 tablet by mouth daily. , Disp: , Rfl:  .   cyclobenzaprine (FLEXERIL) 5 MG tablet, TAKE 1 TABLET BY MOUTH TWICE DAILY AS NEEDED FOR MUSCLE SPASM, Disp: 30 tablet, Rfl: 0 .  DHEA 50 MG TABS, , Disp: , Rfl:  .  FLUoxetine (PROZAC) 20 MG capsule, Take 1 capsule (20 mg total) by mouth daily., Disp: 90 capsule, Rfl: 1 .  folic acid (FOLVITE) Q000111Q MCG tablet, Take 400 mcg by mouth daily., Disp: , Rfl:  .  linaclotide (LINZESS) 290 MCG CAPS capsule, Take 1 capsule (290 mcg total) by mouth daily., Disp: 90 capsule, Rfl: 1 .  magnesium 30 MG tablet, Take 30 mg by mouth 2 (two) times daily., Disp: , Rfl:  .  Multiple Vitamins-Minerals (MULTIVITAMIN WITH MINERALS) tablet, Take 1 tablet by mouth daily.  , Disp: , Rfl:  .  rOPINIRole (REQUIP) 0.25 MG tablet, Take 1-2 tablets (0.25-0.5 mg total) by mouth 2 (two) times daily., Disp: 120 tablet, Rfl: 6 .  traZODone (DESYREL) 50 MG tablet, Take 1 tablet (50 mg total) by mouth at bedtime. (Patient taking differently: Take 25 mg by mouth at bedtime. ), Disp: 90 tablet, Rfl: 3 .  TURMERIC CURCUMIN PO, , Disp: , Rfl:  .  vitamin C (ASCORBIC ACID) 500 MG tablet, , Disp: , Rfl:   Review of Systems:  Constitutional: Denies fever, chills, diaphoresis, appetite change and fatigue.  HEENT: Denies photophobia, eye pain, redness, hearing loss, ear pain, congestion, sore throat, rhinorrhea, sneezing, mouth sores, trouble swallowing, neck pain, neck stiffness and tinnitus.   Respiratory: Denies SOB, DOE, cough, chest tightness,  and wheezing.   Cardiovascular: Denies chest pain, palpitations and leg swelling.  Gastrointestinal: Denies nausea, vomiting, abdominal pain, diarrhea, constipation, blood in stool and abdominal distention.  Genitourinary: Denies dysuria, urgency, frequency, hematuria, flank pain and difficulty urinating.  Endocrine: Denies: hot or cold intolerance, sweats, changes in hair or nails, polyuria, polydipsia. Musculoskeletal: Denies myalgias, back pain, joint swelling, arthralgias and gait problem.   Skin: Denies pallor, rash and wound.  Neurological: Denies  seizures, syncope, , numbness and headaches.  Hematological: Denies adenopathy. Easy bruising, personal or family bleeding history  Psychiatric/Behavioral: Denies suicidal ideation, mood changes, confusion, nervousness, sleep disturbance and agitation    Physical Exam: Vitals:   06/30/19 1139  BP: 110/70  Pulse: 89  Temp: 97.6 F (36.4 C)  TempSrc: Temporal  SpO2: 94%  Weight: 128 lb 4.8 oz (58.2 kg)    Body mass index is 23.85 kg/m.   Constitutional: NAD, calm, comfortable Eyes: PERRL, lids and conjunctivae normal ENMT: Mucous membranes are moist.  Respiratory: clear to auscultation  bilaterally, no wheezing, no crackles. Normal respiratory effort. No accessory muscle use.  Cardiovascular: Regular rate and rhythm, no murmurs / rubs / gallops. No extremity edema. 2+ pedal pulses. No carotid bruits.  Abdomen: no tenderness, no masses palpated. No hepatosplenomegaly. Bowel sounds positive.  Musculoskeletal: no clubbing / cyanosis. No joint deformity upper and lower extremities. Good ROM, no contractures. Normal muscle tone.  Skin: no rashes, lesions, ulcers. No induration Neurologic: Grossly intact and nonfocal Psychiatric: Normal judgment and insight. Alert and oriented x 3. Normal mood.    Impression and Plan:  Insomnia, unspecified type -She uses trazodone she would like me to prescribe but she has enough refills for today, she takes half a tablet at bedtime as needed.  Reactive depression Anxiety Fibromyalgia with myofascial pain -She is followed by psychiatry with this and is on clonazepam and fluoxetine.  Slow transit constipation -She is requesting refills of Linzess today, will order.  Dyslipidemia -Last LDL was 84 in August 2020.  Continue atorvastatin.  Restless leg syndrome/parkinsonism -Followed by [facility] neurology on ropinirole and carbidopa/levodopa.   Patient Instructions  -Nice seeing  you today!!  -Schedule follow up in 3 months for your physical.      Lelon Frohlich, MD [facility]

## 2019-06-30 NOTE — Patient Instructions (Signed)
-  Nice seeing you today!!  -Schedule follow up in 3 months for your physical. 

## 2019-07-05 ENCOUNTER — Encounter: Payer: Self-pay | Admitting: Internal Medicine

## 2019-07-12 NOTE — Telephone Encounter (Signed)
Patient called wanting to know if Dr. Jerilee Hoh is getting her messages. She hasn't received a response back from [facility].  Please Advise

## 2019-07-26 DIAGNOSIS — R42 Dizziness and giddiness: Secondary | ICD-10-CM | POA: Diagnosis not present

## 2019-07-26 DIAGNOSIS — H903 Sensorineural hearing loss, bilateral: Secondary | ICD-10-CM | POA: Diagnosis not present

## 2019-07-26 DIAGNOSIS — H9193 Unspecified hearing loss, bilateral: Secondary | ICD-10-CM | POA: Diagnosis not present

## 2019-07-27 ENCOUNTER — Ambulatory Visit (INDEPENDENT_AMBULATORY_CARE_PROVIDER_SITE_OTHER): Payer: Medicare HMO | Admitting: Psychiatry

## 2019-07-27 ENCOUNTER — Other Ambulatory Visit: Payer: Self-pay

## 2019-07-27 DIAGNOSIS — R69 Illness, unspecified: Secondary | ICD-10-CM | POA: Diagnosis not present

## 2019-07-27 DIAGNOSIS — F3342 Major depressive disorder, recurrent, in full remission: Secondary | ICD-10-CM

## 2019-07-27 MED ORDER — CLONAZEPAM 0.5 MG PO TABS: ORAL_TABLET | ORAL | 5 refills | Status: DC

## 2019-07-27 MED ORDER — FLUOXETINE HCL 20 MG PO CAPS: 20.0000 mg | ORAL_CAPSULE | Freq: Every day | ORAL | 1 refills | Status: DC

## 2019-07-27 MED ORDER — TRAZODONE HCL 50 MG PO TABS: 50.0000 mg | ORAL_TABLET | Freq: Every day | ORAL | 3 refills | Status: DC

## 2019-07-27 NOTE — Progress Notes (Signed)
Psychiatric Initial Adult Assessment   Patient Identification: Shannon Sutton MRN:  [id number] Date of Evaluation:  07/27/2019 Referral Source Dr. Alain Marion Chief Complaint:  Depression Visit Diagnosis major depression single episode in remission  History of Present Illness  Today the patient is doing reasonably well.  She is sleeping and eating well.  She is got good energy.  The patient lives alone.  She takes care of all her institutional ADLs.  She is diagnosed with Parkinson's which seems to be stable.  She has been intolerant of taking Sinemet and has not really talked to her neurologist about this yet.  She has some chronic medical problems mainly related to a sense of feeling dizzy/vertigo.  It occurs when she is around bright light.  She has had no falls at this time.  The patient has 2 daughters 1 of whom lives in [place] who she is close with.  The patient takes Requip for restless legs.  The patient denies any chest pain or shortness of breath.  The patient enjoys the television reads a little bit and actually exercises on a regular basis.  The patient mows her own lawn.  She is active and functioning well.  She is lucid and clear.  She is not suicidal. Associated Signs/Symptoms: Depression Symptoms:  depressed mood, (Hypo) Manic Symptoms:   Anxiety Symptoms:   Psychotic Symptoms:   PTSD Symptoms:   Past Psychiatric History: Prozac, Effexor,Vybrid  Previous Psychotropic Medications: Yes   Substance Abuse History in the last 12 months:  No.  Consequences of Substance Abuse:   Past Medical History:  Past Medical History:  Diagnosis Date  . Adenomatous colon polyp 1990  . Allergy   . Anxiety 12/15/2013  . Arthritis   . Cataract   . Chronic constipation   . Depression 11/16/2012  . Diverticulitis   . Diverticulosis   . 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 External hemorrhoids   . Fibromyalgia with  myofascial pain 11/16/2012  . Headaches, cluster   . Hypercholesterolemia 12/15/2013  . Hypertension   . Insomnia 11/16/2012  . Myalgia and myositis, unspecified   . Other disorder of muscle, ligament, and fascia    eosinophilic fascitis  . Other specified disease of hair and hair follicles   . Pain in joint, multiple sites   . Restless leg syndrome 12/15/2013  . Unspecified sleep apnea   . Urine incontinence     Past Surgical History:  Procedure Laterality Date  . ABDOMINOPLASTY  2006   "tummy tuck"  . BREAST REDUCTION SURGERY  1995  . BREAST SURGERY  1981   left breast biopsy  . CATARACT EXTRACTION, BILATERAL    . MUSCLE BIOPSY  2009   DEEP TISSUE  . REDUCTION MAMMAPLASTY    . REFRACTIVE SURGERY    . TONSILLECTOMY    . TOTAL ABDOMINAL HYSTERECTOMY  1999  . WISDOM TOOTH EXTRACTION      Family Psychiatric History:   Family History:  Family History  Problem Relation Age of Onset  . Colon cancer Father 23  . Heart disease Father   . Colon polyps Mother   . Heart disease Mother   . Heart attack Mother   . COPD Sister   . Emphysema Sister   . COPD Brother   . Emphysema Brother   . Lung cancer Brother   . Breast cancer Paternal Grandmother   . Heart disease Other  family history  . Parkinson's disease Other   . Heart disease Paternal Grandfather   . Colon polyps Paternal Grandfather   . Stomach cancer Neg Hx   . Rectal cancer Neg Hx   . Esophageal cancer Neg Hx     Social History:   Social History   Socioeconomic History  . Marital status: Divorced    Spouse name: Not on file  . Number of children: 2  . Years of education: Not on file  . Highest education level: Not on file  Occupational History  . Occupation: Retired    Fish farm manager: RETIRED    CommentAstronomer  Social Needs  . Financial resource strain: Not on file  . Food insecurity    Worry: Not on file    Inability: Not on file  . Transportation needs    Medical: Not on file     Non-medical: Not on file  Tobacco Use  . Smoking status: Former Smoker    Quit date: 12/19/1986    Years since quitting: 32.6  . Smokeless tobacco: Never Used  Substance and Sexual Activity  . Alcohol use: Yes    Alcohol/week: 1.0 standard drinks    Types: 1 Glasses of wine per week    Comment: 2-3 x year  . Drug use: No  . Sexual activity: Yes    Birth control/protection: Surgical, Post-menopausal  Lifestyle  . Physical activity    Days per week: Not on file    Minutes per session: Not on file  . Stress: Not on file  Relationships  . Social Herbalist on phone: Not on file    Gets together: Not on file    Attends religious service: Not on file    Active member of club or organization: Not on file    Attends meetings of clubs or organizations: Not on file    Relationship status: Not on file  Other Topics Concern  . Not on file  Social History Narrative   Lives alone.  Retired.     Education- 2 years of college   2 daughters   caffeine daily 2-3 cups    Additional Social History:   Allergies:   Allergies  Allergen Reactions  . Crestor [Rosuvastatin Calcium]     abd pain  . Cymbalta [Duloxetine Hcl]     Nausea   . Effexor Xr [Venlafaxine Hcl Er]     dizziness  . Morphine And Related Itching    agitation  . Pravastatin     abd pain  . Promethazine-Codeine Itching    Itching in face  . Rizatriptan Other (See Comments)    Tingling, prickly sensation, feelings of palpitations, flushing.     Metabolic Disorder Labs: Lab Results  Component Value Date   HGBA1C 5.0 02/18/2019   No results found for: PROLACTIN Lab Results  Component Value Date   CHOL 187 05/16/2019   TRIG 47 05/16/2019   HDL 94 05/16/2019   CHOLHDL 2.0 05/16/2019   VLDL 11.4 03/18/2018   LDLCALC 84 05/16/2019   LDLCALC 144 (H) 02/18/2019     Current Medications: Current Outpatient Medications  Medication Sig Dispense Refill  . atorvastatin (LIPITOR) 40 MG tablet Take 1 tablet  (40 mg total) by mouth daily. 30 tablet 7  . B Complex-C (SUPER B COMPLEX) TABS Take 1 tablet by mouth daily.     . Calcium Citrate-Vitamin D (EQ CALCIUM CITRATE+D3 PO)     . carbidopa-levodopa (SINEMET IR) 25-100 MG  tablet Take 1 tablet by mouth 3 (three) times daily before meals. 90 tablet 12  . clonazePAM (KLONOPIN) 0.5 MG tablet 1  qhs 30 tablet 5  . Coenzyme Q10 (COQ-10) 100 MG CAPS Take 1 tablet by mouth daily.     . cyclobenzaprine (FLEXERIL) 5 MG tablet TAKE 1 TABLET BY MOUTH TWICE DAILY AS NEEDED FOR MUSCLE SPASM 30 tablet 0  . DHEA 50 MG TABS     . FLUoxetine (PROZAC) 20 MG capsule Take 1 capsule (20 mg total) by mouth daily. 90 capsule 1  . folic acid (FOLVITE) Q000111Q MCG tablet Take 400 mcg by mouth daily.    Marland Kitchen linaclotide (LINZESS) 290 MCG CAPS capsule Take 1 capsule (290 mcg total) by mouth daily. 90 capsule 1  . magnesium 30 MG tablet Take 30 mg by mouth 2 (two) times daily.    . Multiple Vitamins-Minerals (MULTIVITAMIN WITH MINERALS) tablet Take 1 tablet by mouth daily.      Marland Kitchen rOPINIRole (REQUIP) 0.25 MG tablet Take 1-2 tablets (0.25-0.5 mg total) by mouth 2 (two) times daily. 120 tablet 6  . traZODone (DESYREL) 50 MG tablet Take 1 tablet (50 mg total) by mouth at bedtime. 90 tablet 3  . TURMERIC CURCUMIN PO     . vitamin C (ASCORBIC ACID) 500 MG tablet      No current facility-administered medications for this visit.     Neurologic: Headache: No Seizure: No Paresthesias:No  Musculoskeletal: Strength & Muscle Tone: abnormal Gait & Station: unsteady Patient leans: N/A  Psychiatric Specialty Exam: ROS  There were no vitals taken for this visit.There is no height or weight on file to calculate BMI.  General Appearance: Fairly Groomed  Eye Contact:  Good  Speech:  Clear and Coherent  Volume:  Normal  Mood:  Depressed  Affect:  Appropriate  Thought Process:  Goal Directed  Orientation:  Full (Time, Place, and Person)  Thought Content:  WDL  Suicidal Thoughts:  No   Homicidal Thoughts:  No  Memory:  NA  Judgement:  Good  Insight:  Fair  Psychomotor Activity:  Normal  Concentration:    Recall:  [place] of Knowledge:Good  Language: Fair  Akathisia:  No  Handed:  Right  AIMS (if indicated):    Assets:  Desire for Improvement  ADL's:  Intact  Cognition: WNL  Sleep:      Treatment Plan Summary: At this time the patient is doing very well.  She takes Prozac for her first problem major depression.  Her second problem that of insomnia she takes Klonopin 0.5 mg and trazodone 50 mg without any problems.  She is not oversedated.  Her dizziness does not seem to be related to her medications as it occurs erratically and during the day.  The patient denies chest pain shortness of breath or any neurological symptoms.  She denies fatigue.  This patient will be seen again in 5 months. Jerral Ralph, MD 10/14/20202:55 PM

## 2019-07-29 ENCOUNTER — Other Ambulatory Visit: Payer: Self-pay | Admitting: Family Medicine

## 2019-08-17 ENCOUNTER — Encounter: Payer: Self-pay | Admitting: Family Medicine

## 2019-08-31 DIAGNOSIS — H10413 Chronic giant papillary conjunctivitis, bilateral: Secondary | ICD-10-CM | POA: Diagnosis not present

## 2019-08-31 DIAGNOSIS — H0102B Squamous blepharitis left eye, upper and lower eyelids: Secondary | ICD-10-CM | POA: Diagnosis not present

## 2019-08-31 DIAGNOSIS — H0102A Squamous blepharitis right eye, upper and lower eyelids: Secondary | ICD-10-CM | POA: Diagnosis not present

## 2019-08-31 DIAGNOSIS — H35373 Puckering of macula, bilateral: Secondary | ICD-10-CM | POA: Diagnosis not present

## 2019-08-31 DIAGNOSIS — H04123 Dry eye syndrome of bilateral lacrimal glands: Secondary | ICD-10-CM | POA: Diagnosis not present

## 2019-08-31 DIAGNOSIS — H16143 Punctate keratitis, bilateral: Secondary | ICD-10-CM | POA: Diagnosis not present

## 2019-08-31 DIAGNOSIS — Z961 Presence of intraocular lens: Secondary | ICD-10-CM | POA: Diagnosis not present

## 2019-09-16 ENCOUNTER — Telehealth (HOSPITAL_COMMUNITY): Payer: Self-pay | Admitting: *Deleted

## 2019-09-16 NOTE — Telephone Encounter (Signed)
Left message for pt advising her that MD is not comfortable ordering Xanax for Parkinson's s/s and advised to call her Neurologist for further evaluation.

## 2019-09-16 NOTE — Telephone Encounter (Signed)
Pt called asking if she may start Xanax instead of the Klonopin for her muscle tightness related to the Parkinson's. Pt says she had some old Xanax and tried it and it works much better. States Klonopin not not doing anything. Please review and advise.

## 2019-09-20 ENCOUNTER — Encounter: Payer: Self-pay | Admitting: Family Medicine

## 2019-09-20 NOTE — Telephone Encounter (Signed)
Pt returned call.  She stated that she explained that she is taking the requip 0.25mg  1-2 tabs po bid.  She is not on sinemet.  She has tried 25/100 1 tab TID then went to 1/2 tab TID with no change in how she feels (dizzy SE).  After waking 61min in AM, she begins to fieel exhausted, shaky leg, tight neck, headache.  Unbearable.  Also related feeling panic-y.  She mentioned taking xanax previously for RL and this helped her with anxiety. She is taking clonazepam at bedtime thru Dr. Ardis Rowan.  Is there something else she can try?  She was looking at research and syndrome [facility]? She wondered if this may be waht she has??  Please advise.   Amy /NP out jury duty.

## 2019-09-20 NOTE — Telephone Encounter (Signed)
I called pt and LMVM for her to return call.   

## 2019-09-21 ENCOUNTER — Telehealth (HOSPITAL_COMMUNITY): Payer: Self-pay | Admitting: *Deleted

## 2019-09-21 ENCOUNTER — Other Ambulatory Visit: Payer: Self-pay | Admitting: Internal Medicine

## 2019-09-21 DIAGNOSIS — Z1231 Encounter for screening mammogram for malignant neoplasm of breast: Secondary | ICD-10-CM

## 2019-09-21 NOTE — Telephone Encounter (Signed)
Pt called stating that she did consult with Neuro and they are referring her back to [facility] for management of what she calls "panic attacks". Please review and advise as pt is frustrated at this point.

## 2019-09-22 NOTE — Telephone Encounter (Signed)
Writer spoke with the pt regarding her c/o increased anxiety and "panic attacks". Pt informed that Dr. Geraldine Solar her to reschedule appointment as he will need to assess her c/o before changing/adding medication. Pt verbalizes understanding. Pt now has an appt. For 10/05/19.

## 2019-09-26 DIAGNOSIS — H16143 Punctate keratitis, bilateral: Secondary | ICD-10-CM | POA: Diagnosis not present

## 2019-09-26 DIAGNOSIS — H04123 Dry eye syndrome of bilateral lacrimal glands: Secondary | ICD-10-CM | POA: Diagnosis not present

## 2019-09-26 DIAGNOSIS — H0102A Squamous blepharitis right eye, upper and lower eyelids: Secondary | ICD-10-CM | POA: Diagnosis not present

## 2019-09-26 DIAGNOSIS — H35373 Puckering of macula, bilateral: Secondary | ICD-10-CM | POA: Diagnosis not present

## 2019-09-26 DIAGNOSIS — H0102B Squamous blepharitis left eye, upper and lower eyelids: Secondary | ICD-10-CM | POA: Diagnosis not present

## 2019-09-29 ENCOUNTER — Ambulatory Visit (INDEPENDENT_AMBULATORY_CARE_PROVIDER_SITE_OTHER): Payer: Medicare HMO | Admitting: Internal Medicine

## 2019-09-29 ENCOUNTER — Encounter: Payer: Self-pay | Admitting: Internal Medicine

## 2019-09-29 ENCOUNTER — Other Ambulatory Visit: Payer: Self-pay

## 2019-09-29 VITALS — BP 110/80 | HR 82 | Temp 97.2°F | Ht 61.5 in | Wt 132.1 lb

## 2019-09-29 DIAGNOSIS — R69 Illness, unspecified: Secondary | ICD-10-CM | POA: Diagnosis not present

## 2019-09-29 DIAGNOSIS — G2581 Restless legs syndrome: Secondary | ICD-10-CM

## 2019-09-29 DIAGNOSIS — E785 Hyperlipidemia, unspecified: Secondary | ICD-10-CM | POA: Diagnosis not present

## 2019-09-29 DIAGNOSIS — Z0000 Encounter for general adult medical examination without abnormal findings: Secondary | ICD-10-CM | POA: Diagnosis not present

## 2019-09-29 DIAGNOSIS — M797 Fibromyalgia: Secondary | ICD-10-CM | POA: Diagnosis not present

## 2019-09-29 DIAGNOSIS — G20 Parkinson's disease: Secondary | ICD-10-CM

## 2019-09-29 DIAGNOSIS — F329 Major depressive disorder, single episode, unspecified: Secondary | ICD-10-CM

## 2019-09-29 DIAGNOSIS — F419 Anxiety disorder, unspecified: Secondary | ICD-10-CM

## 2019-09-29 LAB — VITAMIN D 25 HYDROXY (VIT D DEFICIENCY, FRACTURES): VITD: 36.04 ng/mL (ref 30.00–100.00)

## 2019-09-29 LAB — VITAMIN B12: Vitamin B-12: 802 pg/mL (ref 211–911)

## 2019-09-29 NOTE — Progress Notes (Signed)
Established Patient Office Visit     This visit occurred during the SARS-CoV-2 public health emergency.  Safety protocols were in place, including screening questions prior to the visit, additional usage of staff PPE, and extensive cleaning of exam room while observing appropriate contact time as indicated for disinfecting solutions.    CC/Reason for Visit: Annual preventive exam and subsequent Medicare wellness visit  HPI: Shannon Sutton is a 73 y.o. female who is coming in today for the above mentioned reasons. Past Medical History is significant for: Hyperlipidemia well-controlled on a statin, history of depression and anxiety on fluoxetine and clonazepam prescribed by psychiatry, history of parkinsonism and restless leg syndrome followed by neurology on Requip and Sinemet.  She also has a history of chronic constipation for which she takes Linzess  Fibromyalgia. She has routine eye care, has not seen a dentist in years.  She had a colonoscopy in 2018 and is a 5-year callback, her last mammogram was in November 2019 and she has one scheduled for January.  She has no complaints today.   Past Medical/Surgical History: Past Medical History:  Diagnosis Date  . Adenomatous colon polyp 1990  . Allergy   . Anxiety 12/15/2013  . Arthritis   . Cataract   . Chronic constipation   . Depression 11/16/2012  . Diverticulitis   . Diverticulosis   . 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 External hemorrhoids   . Fibromyalgia with myofascial pain 11/16/2012  . Headaches, cluster   . Hypercholesterolemia 12/15/2013  . Hypertension   . Insomnia 11/16/2012  . Myalgia and myositis, unspecified   . Other disorder of muscle, ligament, and fascia    eosinophilic fascitis  . Other specified disease of hair and hair follicles   . Pain in joint, multiple sites   . Restless leg syndrome 12/15/2013  . Unspecified sleep apnea   . Urine  incontinence     Past Surgical History:  Procedure Laterality Date  . ABDOMINOPLASTY  2006   "tummy tuck"  . BREAST REDUCTION SURGERY  1995  . BREAST SURGERY  1981   left breast biopsy  . CATARACT EXTRACTION, BILATERAL    . MUSCLE BIOPSY  2009   DEEP TISSUE  . REDUCTION MAMMAPLASTY    . REFRACTIVE SURGERY    . TONSILLECTOMY    . TOTAL ABDOMINAL HYSTERECTOMY  1999  . WISDOM TOOTH EXTRACTION      Social History:  reports that she quit smoking about 32 years ago. She has never used smokeless tobacco. She reports current alcohol use of about 1.0 standard drinks of alcohol per week. She reports that she does not use drugs.  Allergies: Allergies  Allergen Reactions  . Crestor [Rosuvastatin Calcium]     abd pain  . Cymbalta [Duloxetine Hcl]     Nausea   . Effexor Xr [Venlafaxine Hcl Er]     dizziness  . Morphine And Related Itching    agitation  . Pravastatin     abd pain  . Promethazine-Codeine Itching    Itching in face  . Rizatriptan Other (See Comments)    Tingling, prickly sensation, feelings of palpitations, flushing.     Family History:  Family History  Problem Relation Age of Onset  . Colon cancer Father 62  . Heart disease Father   . Colon polyps Mother   . Heart disease Mother   . Heart attack Mother   .  COPD Sister   . Emphysema Sister   . COPD Brother   . Emphysema Brother   . Lung cancer Brother   . Breast cancer Paternal Grandmother   . Heart disease Other        family history  . Parkinson's disease Other   . Heart disease Paternal Grandfather   . Colon polyps Paternal Grandfather   . Stomach cancer Neg Hx   . Rectal cancer Neg Hx   . Esophageal cancer Neg Hx      Current Outpatient Medications:  .  atorvastatin (LIPITOR) 40 MG tablet, Take 1 tablet (40 mg total) by mouth daily., Disp: 30 tablet, Rfl: 7 .  B Complex-C (SUPER B COMPLEX) TABS, Take 1 tablet by mouth daily. , Disp: , Rfl:  .  Calcium Citrate-Vitamin D (EQ CALCIUM CITRATE+D3  PO), , Disp: , Rfl:  .  carbidopa-levodopa (SINEMET IR) 25-100 MG tablet, Take 1 tablet by mouth 3 (three) times daily before meals., Disp: 90 tablet, Rfl: 12 .  clonazePAM (KLONOPIN) 0.5 MG tablet, 1  qhs, Disp: 30 tablet, Rfl: 5 .  Coenzyme Q10 (COQ-10) 100 MG CAPS, Take 1 tablet by mouth daily. , Disp: , Rfl:  .  cyclobenzaprine (FLEXERIL) 5 MG tablet, TAKE 1 TABLET BY MOUTH TWICE DAILY AS NEEDED FOR MUSCLE SPASM, Disp: 30 tablet, Rfl: 0 .  DHEA 50 MG TABS, , Disp: , Rfl:  .  FLUoxetine (PROZAC) 20 MG capsule, Take 1 capsule (20 mg total) by mouth daily., Disp: 90 capsule, Rfl: 1 .  folic acid (FOLVITE) 734 MCG tablet, Take 400 mcg by mouth daily., Disp: , Rfl:  .  linaclotide (LINZESS) 290 MCG CAPS capsule, Take 1 capsule (290 mcg total) by mouth daily., Disp: 90 capsule, Rfl: 1 .  magnesium 30 MG tablet, Take 30 mg by mouth 2 (two) times daily., Disp: , Rfl:  .  Multiple Vitamins-Minerals (MULTIVITAMIN WITH MINERALS) tablet, Take 1 tablet by mouth daily.  , Disp: , Rfl:  .  rOPINIRole (REQUIP) 0.25 MG tablet, Take 1-2 tablets (0.25-0.5 mg total) by mouth 2 (two) times daily., Disp: 120 tablet, Rfl: 6 .  traZODone (DESYREL) 50 MG tablet, Take 1 tablet (50 mg total) by mouth at bedtime., Disp: 90 tablet, Rfl: 3 .  TURMERIC CURCUMIN PO, , Disp: , Rfl:  .  vitamin C (ASCORBIC ACID) 500 MG tablet, , Disp: , Rfl:   Review of Systems:  Constitutional: Denies fever, chills, diaphoresis, appetite change and fatigue.  HEENT: Denies photophobia, eye pain, redness, hearing loss, ear pain, congestion, sore throat, rhinorrhea, sneezing, mouth sores, trouble swallowing, neck pain, neck stiffness and tinnitus.   Respiratory: Denies SOB, DOE, cough, chest tightness,  and wheezing.   Cardiovascular: Denies chest pain, palpitations and leg swelling.  Gastrointestinal: Denies nausea, vomiting, abdominal pain, diarrhea, constipation, blood in stool and abdominal distention.  Genitourinary: Denies dysuria,  urgency, frequency, hematuria, flank pain and difficulty urinating.  Endocrine: Denies: hot or cold intolerance, sweats, changes in hair or nails, polyuria, polydipsia. Musculoskeletal: Denies myalgias, back pain, joint swelling, arthralgias and gait problem.  Skin: Denies pallor, rash and wound.  Neurological: Denies dizziness, seizures, syncope, weakness, light-headedness, numbness and headaches.  Hematological: Denies adenopathy. Easy bruising, personal or family bleeding history  Psychiatric/Behavioral: Denies suicidal ideation, mood changes, confusion, nervousness, sleep disturbance and agitation    Physical Exam: Vitals:   09/29/19 1010  BP: 110/80  Pulse: 82  Temp: (!) 97.2 F (36.2 C)  TempSrc: Temporal  SpO2: 99%  Weight: 132 lb 1.6 oz (59.9 kg)  Height: 5' 1.5" (1.562 m)    Body mass index is 24.56 kg/m.   Constitutional: NAD, calm, comfortable Eyes: PERRL, lids and conjunctivae normal ENMT: Mucous membranes are moist. Tympanic membrane is pearly white, no erythema or bulging. Neck: normal, supple, no masses, no thyromegaly Respiratory: clear to auscultation bilaterally, no wheezing, no crackles. Normal respiratory effort. No accessory muscle use.  Cardiovascular: Regular rate and rhythm, no murmurs / rubs / gallops. No extremity edema. 2+ pedal pulses. No carotid bruits.  Abdomen: no tenderness, no masses palpated. No hepatosplenomegaly. Bowel sounds positive.  Musculoskeletal: no clubbing / cyanosis. No joint deformity upper and lower extremities. Good ROM, no contractures. Normal muscle tone.  Skin: no rashes, lesions, ulcers. No induration Neurologic: CN 2-12 grossly intact. Sensation intact, DTR normal. Strength 5/5 in all 4.  Psychiatric: Normal judgment and insight. Alert and oriented x 3. Normal mood.   Subsequent Medicare wellness visit   1. Risk factors, based on past  M,S,F -cardiovascular disease risk factors include age, history of hyperlipidemia.     2.  Physical activities: She goes to the gym and walks on a treadmill   3.  Depression/mood:  Stable, she does have a history of depression   4.  Hearing:  No perceived issues   5.  ADL's: Independent in all ADLs   6.  Fall risk:  Moderate fall risk due to history of parkinsonism   7.  Home safety: No problems identified   8.  Height weight, and visual acuity: Height and weight as above, visual acuity is 20/63 in the left eye, 20/50 in the right eye and 20/50 together   9.  Counseling:  Advised follow-up with neurology as scheduled   10. Lab orders based on risk factors: Laboratory update will be reviewed   11. Referral :  None today   12. Care plan:  Follow-up with me in 6 months   13. Cognitive assessment:  No cognitive impairment   14. Screening: Patient provided with a written and personalized 5-10 year screening schedule in the AVS.   yes   15. Provider List Update:   PCP, neurology Dr. Leta Baptist  16. Advance Directives: Full code     Office Visit from 09/29/2019 in [facility] [place]  PHQ-9 Total Score  1      Fall Risk  09/29/2019 06/30/2019 11/01/2018 12/02/2017 08/17/2017  Falls in the past year? 0 0 0 Yes Yes  Number falls in past yr: 0 0 - 2 or more 2 or more  Injury with Fall? 0 0 - No No  Risk for fall due to : - - - Other (Comment) -  Risk for fall due to: Comment - - - doing yard work -     Impression and Plan:  Encounter for preventive health examination  -She has routine eye care, have advised routine dental care. -All immunizations are up-to-date and age-appropriate. -Screening labs today. -Healthy lifestyle has been discussed in detail. -Colonoscopy in 2018 she has a 5-year callback. -She is due for mammogram, she already has been scheduled in January. -She has a GYN that she sees for her GYN needs.  Restless leg syndrome Parkinsonism, unspecified Parkinsonism type ([place] -Followed by neurology, on Requip and  Sinemet  Dyslipidemia -Last LDL was 84 in August 2020, continue atorvastatin  Anxiety Reactive depression Fibromyalgia with myofascial pain -Followed closely by psychiatry.     Patient Instructions  -Nice seeing you today!!  -Lab  work today; will notify you once results are available.  -Schedule follow up in 6 months.   Preventive Care 53 Years and Older, Female Preventive care refers to lifestyle choices and visits with your health care provider that can promote health and wellness. This includes:  A yearly physical exam. This is also called an annual well check.  Regular dental and eye exams.  Immunizations.  Screening for certain conditions.  Healthy lifestyle choices, such as diet and exercise. What can I expect for my preventive care visit? Physical exam Your health care provider will check:  Height and weight. These may be used to calculate body mass index (BMI), which is a measurement that tells if you are at a healthy weight.  Heart rate and blood pressure.  Your skin for abnormal spots. Counseling Your health care provider may ask you questions about:  Alcohol, tobacco, and drug use.  Emotional well-being.  Home and relationship well-being.  Sexual activity.  Eating habits.  History of falls.  Memory and ability to understand (cognition).  Work and work Statistician.  Pregnancy and menstrual history. What immunizations do I need?  Influenza (flu) vaccine  This is recommended every year. Tetanus, diphtheria, and pertussis (Tdap) vaccine  You may need a Td booster every 10 years. Varicella (chickenpox) vaccine  You may need this vaccine if you have not already been vaccinated. Zoster (shingles) vaccine  You may need this after age 90. Pneumococcal conjugate (PCV13) vaccine  One dose is recommended after age 59. Pneumococcal polysaccharide (PPSV23) vaccine  One dose is recommended after age 51. Measles, mumps, and rubella (MMR)  vaccine  You may need at least one dose of MMR if you were born in 1957 or later. You may also need a second dose. Meningococcal conjugate (MenACWY) vaccine  You may need this if you have certain conditions. Hepatitis A vaccine  You may need this if you have certain conditions or if you travel or work in places where you may be exposed to hepatitis A. Hepatitis B vaccine  You may need this if you have certain conditions or if you travel or work in places where you may be exposed to hepatitis B. Haemophilus influenzae type b (Hib) vaccine  You may need this if you have certain conditions. You may receive vaccines as individual doses or as more than one vaccine together in one shot (combination vaccines). Talk with your health care provider about the risks and benefits of combination vaccines. What tests do I need? Blood tests  Lipid and cholesterol levels. These may be checked every 5 years, or more frequently depending on your overall health.  Hepatitis C test.  Hepatitis B test. Screening  Lung cancer screening. You may have this screening every year starting at age 87 if you have a 30-pack-year history of smoking and currently smoke or have quit within the past 15 years.  Colorectal cancer screening. All adults should have this screening starting at age 59 and continuing until age 56. Your health care provider may recommend screening at age 59 if you are at increased risk. You will have tests every 1-10 years, depending on your results and the type of screening test.  Diabetes screening. This is done by checking your blood sugar (glucose) after you have not eaten for a while (fasting). You may have this done every 1-3 years.  Mammogram. This may be done every 1-2 years. Talk with your health care provider about how often you should have regular mammograms.  BRCA-related cancer  screening. This may be done if you have a family history of breast, ovarian, tubal, or peritoneal  cancers. Other tests  Sexually transmitted disease (STD) testing.  Bone density scan. This is done to screen for osteoporosis. You may have this done starting at age 67. Follow these instructions at home: Eating and drinking  Eat a diet that includes fresh fruits and vegetables, whole grains, lean protein, and low-fat dairy products. Limit your intake of foods with high amounts of sugar, saturated fats, and salt.  Take vitamin and mineral supplements as recommended by your health care provider.  Do not drink alcohol if your health care provider tells you not to drink.  If you drink alcohol: ? Limit how much you have to 0-1 drink a day. ? Be aware of how much alcohol is in your drink. In the [place] one drink equals one 12 oz bottle of beer (355 mL), one 5 oz glass of wine (148 mL), or one 1 oz glass of hard liquor (44 mL). Lifestyle  Take daily care of your teeth and gums.  Stay active. Exercise for at least 30 minutes on 5 or more days each week.  Do not use any products that contain nicotine or tobacco, such as cigarettes, e-cigarettes, and chewing tobacco. If you need help quitting, ask your health care provider.  If you are sexually active, practice safe sex. Use a condom or other form of protection in order to prevent STIs (sexually transmitted infections).  Talk with your health care provider about taking a low-dose aspirin or statin. What's next?  Go to your health care provider once a year for a well check visit.  Ask your health care provider how often you should have your eyes and teeth checked.  Stay up to date on all vaccines. This information is not intended to replace advice given to you by your health care provider. Make sure you discuss any questions you have with your health care provider. Document Released: 10/26/2015 Document Revised: 09/23/2018 Document Reviewed: 09/23/2018 Elsevier Patient Education  2020 [place],  MD [facility]

## 2019-09-29 NOTE — Patient Instructions (Signed)
-Nice seeing you today!!  -Lab work today; will notify you once results are available.  -Schedule follow up in 6 months.   Preventive Care 65 Years and Older, Female Preventive care refers to lifestyle choices and visits with your health care provider that can promote health and wellness. This includes:  A yearly physical exam. This is also called an annual well check.  Regular dental and eye exams.  Immunizations.  Screening for certain conditions.  Healthy lifestyle choices, such as diet and exercise. What can I expect for my preventive care visit? Physical exam Your health care provider will check:  Height and weight. These may be used to calculate body mass index (BMI), which is a measurement that tells if you are at a healthy weight.  Heart rate and blood pressure.  Your skin for abnormal spots. Counseling Your health care provider may ask you questions about:  Alcohol, tobacco, and drug use.  Emotional well-being.  Home and relationship well-being.  Sexual activity.  Eating habits.  History of falls.  Memory and ability to understand (cognition).  Work and work environment.  Pregnancy and menstrual history. What immunizations do I need?  Influenza (flu) vaccine  This is recommended every year. Tetanus, diphtheria, and pertussis (Tdap) vaccine  You may need a Td booster every 10 years. Varicella (chickenpox) vaccine  You may need this vaccine if you have not already been vaccinated. Zoster (shingles) vaccine  You may need this after age 60. Pneumococcal conjugate (PCV13) vaccine  One dose is recommended after age 65. Pneumococcal polysaccharide (PPSV23) vaccine  One dose is recommended after age 65. Measles, mumps, and rubella (MMR) vaccine  You may need at least one dose of MMR if you were born in 1957 or later. You may also need a second dose. Meningococcal conjugate (MenACWY) vaccine  You may need this if you have certain  conditions. Hepatitis A vaccine  You may need this if you have certain conditions or if you travel or work in places where you may be exposed to hepatitis A. Hepatitis B vaccine  You may need this if you have certain conditions or if you travel or work in places where you may be exposed to hepatitis B. Haemophilus influenzae type b (Hib) vaccine  You may need this if you have certain conditions. You may receive vaccines as individual doses or as more than one vaccine together in one shot (combination vaccines). Talk with your health care provider about the risks and benefits of combination vaccines. What tests do I need? Blood tests  Lipid and cholesterol levels. These may be checked every 5 years, or more frequently depending on your overall health.  Hepatitis C test.  Hepatitis B test. Screening  Lung cancer screening. You may have this screening every year starting at age 55 if you have a 30-pack-year history of smoking and currently smoke or have quit within the past 15 years.  Colorectal cancer screening. All adults should have this screening starting at age 50 and continuing until age 75. Your health care provider may recommend screening at age 45 if you are at increased risk. You will have tests every 1-10 years, depending on your results and the type of screening test.  Diabetes screening. This is done by checking your blood sugar (glucose) after you have not eaten for a while (fasting). You may have this done every 1-3 years.  Mammogram. This may be done every 1-2 years. Talk with your health care provider about how often you should   have regular mammograms.  BRCA-related cancer screening. This may be done if you have a family history of breast, ovarian, tubal, or peritoneal cancers. Other tests  Sexually transmitted disease (STD) testing.  Bone density scan. This is done to screen for osteoporosis. You may have this done starting at age 76. Follow these instructions at  home: Eating and drinking  Eat a diet that includes fresh fruits and vegetables, whole grains, lean protein, and low-fat dairy products. Limit your intake of foods with high amounts of sugar, saturated fats, and salt.  Take vitamin and mineral supplements as recommended by your health care provider.  Do not drink alcohol if your health care provider tells you not to drink.  If you drink alcohol: ? Limit how much you have to 0-1 drink a day. ? Be aware of how much alcohol is in your drink. In the [place] one drink equals one 12 oz bottle of beer (355 mL), one 5 oz glass of wine (148 mL), or one 1 oz glass of hard liquor (44 mL). Lifestyle  Take daily care of your teeth and gums.  Stay active. Exercise for at least 30 minutes on 5 or more days each week.  Do not use any products that contain nicotine or tobacco, such as cigarettes, e-cigarettes, and chewing tobacco. If you need help quitting, ask your health care provider.  If you are sexually active, practice safe sex. Use a condom or other form of protection in order to prevent STIs (sexually transmitted infections).  Talk with your health care provider about taking a low-dose aspirin or statin. What's next?  Go to your health care provider once a year for a well check visit.  Ask your health care provider how often you should have your eyes and teeth checked.  Stay up to date on all vaccines. This information is not intended to replace advice given to you by your health care provider. Make sure you discuss any questions you have with your health care provider. Document Released: 10/26/2015 Document Revised: 09/23/2018 Document Reviewed: 09/23/2018 Elsevier Patient Education  2020 [employer].

## 2019-10-05 ENCOUNTER — Ambulatory Visit (HOSPITAL_COMMUNITY): Payer: Medicare HMO | Admitting: Psychiatry

## 2019-10-14 DIAGNOSIS — Z9289 Personal history of other medical treatment: Secondary | ICD-10-CM

## 2019-10-14 HISTORY — DX: Personal history of other medical treatment: Z92.89

## 2019-10-18 ENCOUNTER — Ambulatory Visit (INDEPENDENT_AMBULATORY_CARE_PROVIDER_SITE_OTHER): Payer: Medicare HMO | Admitting: Psychiatry

## 2019-10-18 ENCOUNTER — Other Ambulatory Visit: Payer: Self-pay

## 2019-10-18 DIAGNOSIS — R69 Illness, unspecified: Secondary | ICD-10-CM | POA: Diagnosis not present

## 2019-10-18 DIAGNOSIS — F411 Generalized anxiety disorder: Secondary | ICD-10-CM

## 2019-10-18 DIAGNOSIS — G4700 Insomnia, unspecified: Secondary | ICD-10-CM | POA: Diagnosis not present

## 2019-10-18 DIAGNOSIS — F324 Major depressive disorder, single episode, in partial remission: Secondary | ICD-10-CM | POA: Diagnosis not present

## 2019-10-18 MED ORDER — TRAZODONE HCL 50 MG PO TABS: 50.0000 mg | ORAL_TABLET | Freq: Every day | ORAL | 3 refills | Status: DC

## 2019-10-18 MED ORDER — FLUOXETINE HCL 20 MG PO CAPS: ORAL_CAPSULE | ORAL | 1 refills | Status: DC

## 2019-10-18 MED ORDER — CLONAZEPAM 0.5 MG PO TABS: ORAL_TABLET | ORAL | 2 refills | Status: DC

## 2019-10-18 NOTE — Progress Notes (Signed)
Psychiatric Initial Adult Assessment   Patient Identification: Shannon Sutton MRN:  [id number] Date of Evaluation:  10/18/2019 Referral Source Dr. Alain Marion Chief Complaint:  Depression Visit Diagnosis major depression single episode in remission  History of Present Illness  Today the patient moved up her appointment because of the stress around her neurologist.  Apparently the patient saw her neurologist and complained of anxiety.  The neurologist told her to contact her psychiatrist to discuss to speak about anxiety.  Patient also looked up some things in the literature about Parkinson's and does not think she is getting adequate care.  She believes she is having akathisia.  I shared with her I do not know why she is taking it as akathisia and she is on no medicines to cause this.  Nonetheless she insists on changing her neurologist.  The patient does not spirits increasing depression.  She also took some Klonopin during the day which did seem to help her neck pain.  I suspect she has stress from her neck which are related to her anxiety.  I shared reviewed the use of benzodiazepine for muscle skeletal strain is well-known.  The patient is not psychotic.  She is not drinking any alcohol or using any drugs.  She still stays active.  She goes to the gym twice a week.  She is eating well. Associated Signs/Symptoms: Depression Symptoms:  depressed mood, (Hypo) Manic Symptoms:   Anxiety Symptoms:   Psychotic Symptoms:   PTSD Symptoms:   Past Psychiatric History: Prozac, Effexor,Vybrid  Previous Psychotropic Medications: Yes   Substance Abuse History in the last 12 months:  No.  Consequences of Substance Abuse:   Past Medical History:  Past Medical History:  Diagnosis Date  . Adenomatous colon polyp 1990  . Allergy   . Anxiety 12/15/2013  . Arthritis   . Cataract   . Chronic constipation   . Depression 11/16/2012  . Diverticulitis   . Diverticulosis   . 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 External hemorrhoids   . Fibromyalgia with myofascial pain 11/16/2012  . Headaches, cluster   . Hypercholesterolemia 12/15/2013  . Hypertension   . Insomnia 11/16/2012  . Myalgia and myositis, unspecified   . Other disorder of muscle, ligament, and fascia    eosinophilic fascitis  . Other specified disease of hair and hair follicles   . Pain in joint, multiple sites   . Restless leg syndrome 12/15/2013  . Unspecified sleep apnea   . Urine incontinence     Past Surgical History:  Procedure Laterality Date  . ABDOMINOPLASTY  2006   "tummy tuck"  . BREAST REDUCTION SURGERY  1995  . BREAST SURGERY  1981   left breast biopsy  . CATARACT EXTRACTION, BILATERAL    . MUSCLE BIOPSY  2009   DEEP TISSUE  . REDUCTION MAMMAPLASTY    . REFRACTIVE SURGERY    . TONSILLECTOMY    . TOTAL ABDOMINAL HYSTERECTOMY  1999  . WISDOM TOOTH EXTRACTION      Family Psychiatric History:   Family History:  Family History  Problem Relation Age of Onset  . Colon cancer Father 75  . Heart disease Father   . Colon polyps Mother   . Heart disease Mother   . Heart attack Mother   . COPD Sister   . Emphysema Sister   . COPD Brother   . Emphysema Brother   . Lung cancer Brother   .  Breast cancer Paternal Grandmother   . Heart disease Other        family history  . Parkinson's disease Other   . Heart disease Paternal Grandfather   . Colon polyps Paternal Grandfather   . Stomach cancer Neg Hx   . Rectal cancer Neg Hx   . Esophageal cancer Neg Hx     Social History:   Social History   Socioeconomic History  . Marital status: Divorced    Spouse name: Not on file  . Number of children: 2  . Years of education: Not on file  . Highest education level: Not on file  Occupational History  . Occupation: Retired    Fish farm manager: RETIRED    CommentAstronomer  Tobacco Use  . Smoking status: Former Smoker    Quit date:  12/19/1986    Years since quitting: 32.8  . Smokeless tobacco: Never Used  Substance and Sexual Activity  . Alcohol use: Yes    Alcohol/week: 1.0 standard drinks    Types: 1 Glasses of wine per week    Comment: 2-3 x year  . Drug use: No  . Sexual activity: Yes    Birth control/protection: Surgical, Post-menopausal  Other Topics Concern  . Not on file  Social History Narrative   Lives alone.  Retired.     Education- 2 years of college   2 daughters   caffeine daily 2-3 cups   Social Determinants of Health   Financial Resource Strain:   . Difficulty of Paying Living Expenses: Not on file  Food Insecurity:   . Worried About Charity fundraiser in the Last Year: Not on file  . Ran Out of Food in the Last Year: Not on file  Transportation Needs:   . Lack of Transportation (Medical): Not on file  . Lack of Transportation (Non-Medical): Not on file  Physical Activity:   . Days of Exercise per Week: Not on file  . Minutes of Exercise per Session: Not on file  Stress:   . Feeling of Stress : Not on file  Social Connections:   . Frequency of Communication with Friends and Family: Not on file  . Frequency of Social Gatherings with Friends and Family: Not on file  . Attends Religious Services: Not on file  . Active Member of Clubs or Organizations: Not on file  . Attends Archivist Meetings: Not on file  . Marital Status: Not on file    Additional Social History:   Allergies:   Allergies  Allergen Reactions  . Crestor [Rosuvastatin Calcium]     abd pain  . Cymbalta [Duloxetine Hcl]     Nausea   . Effexor Xr [Venlafaxine Hcl Er]     dizziness  . Morphine And Related Itching    agitation  . Pravastatin     abd pain  . Promethazine-Codeine Itching    Itching in face  . Rizatriptan Other (See Comments)    Tingling, prickly sensation, feelings of palpitations, flushing.     Metabolic Disorder Labs: Lab Results  Component Value Date   HGBA1C 5.0 02/18/2019    No results found for: PROLACTIN Lab Results  Component Value Date   CHOL 187 05/16/2019   TRIG 47 05/16/2019   HDL 94 05/16/2019   CHOLHDL 2.0 05/16/2019   VLDL 11.4 03/18/2018   LDLCALC 84 05/16/2019   LDLCALC 144 (H) 02/18/2019     Current Medications: Current Outpatient Medications  Medication Sig Dispense Refill  .  atorvastatin (LIPITOR) 40 MG tablet Take 1 tablet (40 mg total) by mouth daily. 30 tablet 7  . B Complex-C (SUPER B COMPLEX) TABS Take 1 tablet by mouth daily.     . Calcium Citrate-Vitamin D (EQ CALCIUM CITRATE+D3 PO)     . carbidopa-levodopa (SINEMET IR) 25-100 MG tablet Take 1 tablet by mouth 3 (three) times daily before meals. 90 tablet 12  . clonazePAM (KLONOPIN) 0.5 MG tablet 1 bid  1  Prn for anxiety 90 tablet 2  . Coenzyme Q10 (COQ-10) 100 MG CAPS Take 1 tablet by mouth daily.     . cyclobenzaprine (FLEXERIL) 5 MG tablet TAKE 1 TABLET BY MOUTH TWICE DAILY AS NEEDED FOR MUSCLE SPASM 30 tablet 0  . DHEA 50 MG TABS     . FLUoxetine (PROZAC) 20 MG capsule 2  qam [id number] capsule 1  . folic acid (FOLVITE) Q000111Q MCG tablet Take 400 mcg by mouth daily.    Marland Kitchen linaclotide (LINZESS) 290 MCG CAPS capsule Take 1 capsule (290 mcg total) by mouth daily. 90 capsule 1  . magnesium 30 MG tablet Take 30 mg by mouth 2 (two) times daily.    . Multiple Vitamins-Minerals (MULTIVITAMIN WITH MINERALS) tablet Take 1 tablet by mouth daily.      Marland Kitchen rOPINIRole (REQUIP) 0.25 MG tablet Take 1-2 tablets (0.25-0.5 mg total) by mouth 2 (two) times daily. 120 tablet 6  . traZODone (DESYREL) 50 MG tablet Take 1 tablet (50 mg total) by mouth at bedtime. 90 tablet 3  . TURMERIC CURCUMIN PO     . vitamin C (ASCORBIC ACID) 500 MG tablet      No current facility-administered medications for this visit.    Neurologic: Headache: No Seizure: No Paresthesias:No  Musculoskeletal: Strength & Muscle Tone: abnormal Gait & Station: unsteady Patient leans: N/A  Psychiatric Specialty Exam: ROS  There  were no vitals taken for this visit.There is no height or weight on file to calculate BMI.  General Appearance: Fairly Groomed  Eye Contact:  Good  Speech:  Clear and Coherent  Volume:  Normal  Mood:  Depressed  Affect:  Appropriate  Thought Process:  Goal Directed  Orientation:  Full (Time, Place, and Person)  Thought Content:  WDL  Suicidal Thoughts:  No  Homicidal Thoughts:  No  Memory:  NA  Judgement:  Good  Insight:  Fair  Psychomotor Activity:  Normal  Concentration:    Recall:  [place] of Knowledge:Good  Language: Fair  Akathisia:  No  Handed:  Right  AIMS (if indicated):    Assets:  Desire for Improvement  ADL's:  Intact  Cognition: WNL  Sleep:      Treatment Plan Summary: This was a shortened visit.  Today however I do believe the patient is experiencing a worsening of her major depression.  Her first problem is that of major depression and should go ahead and increase her Prozac 20 to 40 mg.  Her second problem is that of anxiety most likely that of generalized anxiety.  At this time we will increase her Klonopin 2.5 mg twice daily with 1 as needed.  Patient will also continue taking trazodone for her third problem which is insomnia.  This seems to be very effective.  Patient was seen again in approximately 2 months. Jerral Ralph, MD 1/5/20213:47 PM

## 2019-10-20 ENCOUNTER — Telehealth (HOSPITAL_COMMUNITY): Payer: Self-pay

## 2019-10-20 NOTE — Telephone Encounter (Signed)
Patient called stating that she had spoken with you regarding a referral to a new neurologist. She does not want to go back to the one that she's been seeing. Please advise. Thank you.

## 2019-10-27 NOTE — Telephone Encounter (Signed)
Spoke with patient and she stated that she spoke with Dr. Georgie Chard office. They told her she needed a referral. If you need to speak with them, you can call them at [phone]

## 2019-11-03 ENCOUNTER — Ambulatory Visit: Payer: Medicare HMO | Attending: Internal Medicine

## 2019-11-03 DIAGNOSIS — Z23 Encounter for immunization: Secondary | ICD-10-CM | POA: Insufficient documentation

## 2019-11-03 NOTE — Progress Notes (Signed)
Covid-19 Vaccination Clinic  Name:  Shannon Sutton    MRN: [id number] DOB: November 18, 1945  11/03/2019  Shannon Sutton was observed post Covid-19 immunization for 15 minutes without incidence. She was provided with Vaccine Information Sheet and instruction to access the V-Safe system.   Shannon Sutton was instructed to call 911 with any severe reactions post vaccine: Marland Kitchen Difficulty breathing  . Swelling of your face and throat  . A fast heartbeat  . A bad rash all over your body  . Dizziness and weakness    Immunizations Administered    Name Date Dose VIS Date Route   Pfizer COVID-19 Vaccine 11/03/2019 12:40 PM 0.3 mL 09/23/2019 Intramuscular   Manufacturer: [employer], [employer]   Lot: [id number]   NDC: [address]-7

## 2019-11-07 ENCOUNTER — Encounter: Payer: Self-pay | Admitting: Family Medicine

## 2019-11-11 ENCOUNTER — Ambulatory Visit
Admission: RE | Admit: 2019-11-11 | Discharge: 2019-11-11 | Disposition: A | Discharge status: Home / Self Care | Payer: Medicare HMO | Source: Ambulatory Visit | Attending: Internal Medicine | Admitting: Internal Medicine

## 2019-11-11 ENCOUNTER — Other Ambulatory Visit: Payer: Self-pay

## 2019-11-11 DIAGNOSIS — Z1231 Encounter for screening mammogram for malignant neoplasm of breast: Secondary | ICD-10-CM | POA: Diagnosis not present

## 2019-11-14 DIAGNOSIS — H0102A Squamous blepharitis right eye, upper and lower eyelids: Secondary | ICD-10-CM | POA: Diagnosis not present

## 2019-11-14 DIAGNOSIS — H0102B Squamous blepharitis left eye, upper and lower eyelids: Secondary | ICD-10-CM | POA: Diagnosis not present

## 2019-11-14 DIAGNOSIS — H35373 Puckering of macula, bilateral: Secondary | ICD-10-CM | POA: Diagnosis not present

## 2019-11-14 DIAGNOSIS — H16143 Punctate keratitis, bilateral: Secondary | ICD-10-CM | POA: Diagnosis not present

## 2019-11-14 DIAGNOSIS — H04123 Dry eye syndrome of bilateral lacrimal glands: Secondary | ICD-10-CM | POA: Diagnosis not present

## 2019-11-16 ENCOUNTER — Encounter: Payer: Self-pay | Admitting: Diagnostic Neuroimaging

## 2019-11-16 ENCOUNTER — Other Ambulatory Visit: Payer: Self-pay

## 2019-11-16 ENCOUNTER — Ambulatory Visit: Payer: Medicare HMO | Admitting: Diagnostic Neuroimaging

## 2019-11-16 VITALS — BP 138/79 | HR 74 | Temp 97.5°F | Ht 61.5 in | Wt 132.0 lb

## 2019-11-16 DIAGNOSIS — G2581 Restless legs syndrome: Secondary | ICD-10-CM

## 2019-11-16 DIAGNOSIS — M436 Torticollis: Secondary | ICD-10-CM | POA: Diagnosis not present

## 2019-11-16 DIAGNOSIS — R531 Weakness: Secondary | ICD-10-CM | POA: Diagnosis not present

## 2019-11-16 DIAGNOSIS — G20 Parkinson's disease: Secondary | ICD-10-CM

## 2019-11-16 NOTE — Patient Instructions (Signed)
-   monitor BP at home (laying and standing; with and without dizziness)  - continue ropinirole  - consider second opinion

## 2019-11-16 NOTE — Progress Notes (Signed)
Chief Complaint  Patient presents with  . Parkinsonism    rm 7 "early FU requested for dizziness, lightheaded, blurry vision, fatigue, muscle weakness, nausea"    History of Present Illness:  - continues with slow movements, neck pain, fatigue, anxiety, muscle spasms - tried carb/levo (no benefit; more dizziness) - some dizziness with standing    Observations/Objective:  GENERAL EXAM/CONSTITUTIONAL: Vitals:  Vitals:   11/16/19 1243  BP: 138/79  Pulse: 74  Temp: (!) 97.5 F (36.4 C)  Weight: 132 lb (59.9 kg)  Height: 5' 1.5" (1.562 m)     Body mass index is 24.54 kg/m. Wt Readings from Last 3 Encounters:  11/16/19 132 lb (59.9 kg)  09/29/19 132 lb 1.6 oz (59.9 kg)  06/30/19 128 lb 4.8 oz (58.2 kg)    Orthostatic VS for the past 24 hrs (Last 3 readings):  BP- Lying Pulse- Lying BP- Sitting Pulse- Sitting BP- Standing at 0 minutes Pulse- Standing at 0 minutes  11/16/19 1306 152/89 80 154/80 77 128/79 80     Patient is in no distress; well developed, nourished and groomed; neck is supple  CARDIOVASCULAR:  Examination of carotid arteries is normal; no carotid bruits  Regular rate and rhythm, no murmurs  Examination of peripheral vascular system by observation and palpation is normal  EYES:  Ophthalmoscopic exam of optic discs and posterior segments is normal; no papilledema or hemorrhages  No exam data present  MUSCULOSKELETAL:  Gait, strength, tone, movements noted in Neurologic exam below  NEUROLOGIC: MENTAL STATUS:  No flowsheet data found.  awake, alert, oriented to person, place and time  recent and remote memory intact  normal attention and concentration  language fluent, comprehension intact, naming intact  fund of knowledge appropriate  CRANIAL NERVE:   2nd - no papilledema on fundoscopic exam  2nd, 3rd, 4th, 6th - pupils equal and reactive to light, visual fields full to confrontation, extraocular muscles intact, no  nystagmus  5th - facial sensation symmetric  7th - facial strength symmetric  8th - hearing intact  9th - palate elevates symmetrically, uvula midline  11th - shoulder shrug symmetric  12th - tongue protrusion midline  MASKED FACIES  MOTOR:   normal bulk and tone, full strength in the BUE, BLE  SLOW MOVEMENTS  SENSORY:   normal and symmetric to light touch, temperature, vibration  COORDINATION:   finger-nose-finger, fine finger movements normal  REFLEXES:   deep tendon reflexes present and symmetric  GAIT/STATION:   narrow based gait; SLOW, SHORT STEPS   12/30/18 DATscan - Subtle truncation of the posterior RIGHT striata compared to the LEFT. While this could be an anatomic variation, the basal ganglia are symmetric RIGHT to LEFT on comparison MRI. Findings could indicate Parkinson's syndrome pathology.   Assessment and Plan:  74 y.o.   Dx:  1. Parkinsonism, unspecified Parkinsonism type ([place])   2. Neck stiffness   3. Restless leg syndrome   4. Generalized weakness     PARKINSONISM (akinetic-rigid) - ropinirole 0.25 / 0.5mg  (could not tolerate higher dose) - could not tolerate carb/levo - continue physical therapy for gait and balance training - improve nutrition, depression and anxiety treatment - monitor BP at home   [place] (+ chronic daily headaches; medication overuse headache) - tried and failed topiramate  - tried and failed rizatriptan - continue ibuprofen / tylenol as needed (limit to 5-10 doses per month)   Follow Up Instructions:  - Return in about 1 year (around 11/15/2020).  Penni Bombard, MD 0000000, 0000000 PM Certified in Neurology, Neurophysiology and Neuroimaging  [facility] [address], [place], [place] [address] [phone]

## 2019-11-21 NOTE — Telephone Encounter (Signed)
Referral is done to Dr. Georgie Chard office. They stated that they're about a month out. They will review it and reach out to the patient. Called patient to let her know.

## 2019-11-24 ENCOUNTER — Ambulatory Visit: Payer: Medicare HMO | Attending: Internal Medicine

## 2019-11-24 DIAGNOSIS — Z23 Encounter for immunization: Secondary | ICD-10-CM | POA: Insufficient documentation

## 2019-11-24 NOTE — Progress Notes (Signed)
Covid-19 Vaccination Clinic  Name:  LORIANNE TERHUNE    MRN: [id number] DOB: 1946-06-04  11/24/2019  Ms. Coxwell was observed post Covid-19 immunization for 15 minutes without incidence. She was provided with Vaccine Information Sheet and instruction to access the V-Safe system.   Ms. Shingledecker was instructed to call 911 with any severe reactions post vaccine: Marland Kitchen Difficulty breathing  . Swelling of your face and throat  . A fast heartbeat  . A bad rash all over your body  . Dizziness and weakness    Immunizations Administered    Name Date Dose VIS Date Route   Pfizer COVID-19 Vaccine 11/24/2019 12:18 PM 0.3 mL 09/23/2019 Intramuscular   Manufacturer: [employer], [employer]   Lot: [id number]   NDC: [address]-5

## 2019-12-12 ENCOUNTER — Other Ambulatory Visit: Payer: Self-pay | Admitting: Diagnostic Neuroimaging

## 2019-12-12 NOTE — Telephone Encounter (Signed)
Refilled requip x 3 months with note to pharmacy: have her schedule one year FU.

## 2019-12-13 DIAGNOSIS — H35431 Paving stone degeneration of retina, right eye: Secondary | ICD-10-CM | POA: Diagnosis not present

## 2019-12-13 DIAGNOSIS — H43811 Vitreous degeneration, right eye: Secondary | ICD-10-CM | POA: Diagnosis not present

## 2019-12-13 DIAGNOSIS — H43391 Other vitreous opacities, right eye: Secondary | ICD-10-CM | POA: Diagnosis not present

## 2019-12-13 DIAGNOSIS — H35373 Puckering of macula, bilateral: Secondary | ICD-10-CM | POA: Diagnosis not present

## 2019-12-15 ENCOUNTER — Telehealth (HOSPITAL_COMMUNITY): Payer: Self-pay

## 2019-12-15 DIAGNOSIS — Z0100 Encounter for examination of eyes and vision without abnormal findings: Secondary | ICD-10-CM | POA: Diagnosis not present

## 2019-12-15 NOTE — Telephone Encounter (Signed)
I've spoken with the patient regarding her referral to Dr. Georgie Chard office at [facility]. I also contacted Hinton Dyer at [facility] and faxed all the documentation she requested for the referral. [facility] contacted the patient after it went up for review and informed patient that Dr. Carles Collet will not take her on as a new patient and that the patient's referral has been denied. When I inquired as to what the reason for the denial was, Hinton Dyer told me that she doesn't know and that if they were willing to take her, that she would not have seen Dr. Tomi Likens. She would have seen Dr. Carles Collet due to patient's Parkinson's. So the question is, where do we go from here? Please review and advise. Thank you.

## 2019-12-20 ENCOUNTER — Ambulatory Visit: Payer: Medicare HMO | Admitting: Family Medicine

## 2019-12-21 ENCOUNTER — Telehealth: Payer: Self-pay | Admitting: Internal Medicine

## 2019-12-21 NOTE — Telephone Encounter (Signed)
Pt fell on Sunday and has a stiff neck. Pt would like to know if she can have medication called in to the office or if she needs to be seen? Per pt, in the past medication has been called in.  Thanks

## 2019-12-21 NOTE — Telephone Encounter (Signed)
Spoke with patient and an appointment scheduled 

## 2019-12-22 ENCOUNTER — Telehealth (INDEPENDENT_AMBULATORY_CARE_PROVIDER_SITE_OTHER): Payer: Medicare HMO | Admitting: Adult Health

## 2019-12-22 ENCOUNTER — Encounter: Payer: Self-pay | Admitting: Adult Health

## 2019-12-22 DIAGNOSIS — I951 Orthostatic hypotension: Secondary | ICD-10-CM

## 2019-12-22 DIAGNOSIS — M542 Cervicalgia: Secondary | ICD-10-CM | POA: Diagnosis not present

## 2019-12-22 MED ORDER — CYCLOBENZAPRINE HCL 5 MG PO TABS: 5.0000 mg | ORAL_TABLET | Freq: Three times a day (TID) | ORAL | 0 refills | Status: DC | PRN

## 2019-12-22 NOTE — Progress Notes (Signed)
Virtual Visit via Video Note  I connected with Shannon Sutton  on 12/22/19 at 10:00 AM EST by a video enabled telemedicine application and verified that I am speaking with the correct person using two identifiers.  Location patient: home Location provider:work or home office Persons participating in the virtual visit: patient, provider  I discussed the limitations of evaluation and management by telemedicine and the availability of in person appointments. The patient expressed understanding and agreed to proceed.   HPI: She is being evaluated today for 2 separate acute issues.  Her first acute issue is that of right sided neck pain.  She reports that her days ago she was working out in the yard and turned around too quickly causing her to fall onto her right side.  She fell in the grass, did not hit her head and had no loss of consciousness.  Since that time she has had a stiff neck with some loss of range of motion when she turns her head to the right side.  She denies any loss of range of motion in the right shoulder.  She does report dizziness, lightheadedness, and headaches but these are chronic issues and have not become any worse since the fall.  At home she has been using a heating pad which helps with the pain.   Her second issue that she would like to discuss today is that of dizziness.  She reports dizziness on change of positions such as going from sitting to standing.  She was monitoring her blood pressure at home and reports that when she would stand that her blood pressure would drop into the 90s over 50s to 60s and she would feel dizzy momentarily.  She does report that she is staying hydrated and drinks throughout the day.  She is currently on no blood pressure medication  BP Readings from Last 3 Encounters:  11/16/19 138/79  09/29/19 110/80  06/30/19 110/70    ROS: See pertinent positives and negatives per HPI.  Past Medical History:  Diagnosis Date  . Adenomatous colon  polyp 1990  . Allergy   . Anxiety 12/15/2013  . Arthritis   . Cataract   . Chronic constipation   . Depression 11/16/2012  . Diverticulitis   . Diverticulosis   . 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 External hemorrhoids   . Fibromyalgia with myofascial pain 11/16/2012  . Headaches, cluster   . Hypercholesterolemia 12/15/2013  . Hypertension   . Insomnia 11/16/2012  . Myalgia and myositis, unspecified   . Other disorder of muscle, ligament, and fascia    eosinophilic fascitis  . Other specified disease of hair and hair follicles   . Pain in joint, multiple sites   . Restless leg syndrome 12/15/2013  . Unspecified sleep apnea   . Urine incontinence     Past Surgical History:  Procedure Laterality Date  . ABDOMINOPLASTY  2006   "tummy tuck"  . BREAST REDUCTION SURGERY  1995  . BREAST SURGERY  1981   left breast biopsy  . CATARACT EXTRACTION, BILATERAL    . MUSCLE BIOPSY  2009   DEEP TISSUE  . REDUCTION MAMMAPLASTY    . REFRACTIVE SURGERY    . TONSILLECTOMY    . TOTAL ABDOMINAL HYSTERECTOMY  1999  . WISDOM TOOTH EXTRACTION      Family History  Problem Relation Age of Onset  . Colon cancer Father 21  . Heart disease  Father   . Colon polyps Mother   . Heart disease Mother   . Heart attack Mother   . COPD Sister   . Emphysema Sister   . COPD Brother   . Emphysema Brother   . Lung cancer Brother   . Breast cancer Paternal Grandmother   . Heart disease Other        family history  . Parkinson's disease Other   . Heart disease Paternal Grandfather   . Colon polyps Paternal Grandfather   . Stomach cancer Neg Hx   . Rectal cancer Neg Hx   . Esophageal cancer Neg Hx        Current Outpatient Medications:  .  atorvastatin (LIPITOR) 40 MG tablet, Take 1 tablet (40 mg total) by mouth daily., Disp: 30 tablet, Rfl: 7 .  B Complex-C (SUPER B COMPLEX) TABS, Take 1 tablet by mouth daily. , Disp: , Rfl:  .   Calcium Citrate-Vitamin D (EQ CALCIUM CITRATE+D3 PO), , Disp: , Rfl:  .  clonazePAM (KLONOPIN) 0.5 MG tablet, 1 bid  1  Prn for anxiety, Disp: 90 tablet, Rfl: 2 .  Coenzyme Q10 (COQ-10) 100 MG CAPS, Take 1 tablet by mouth daily. , Disp: , Rfl:  .  cyclobenzaprine (FLEXERIL) 5 MG tablet, TAKE 1 TABLET BY MOUTH TWICE DAILY AS NEEDED FOR MUSCLE SPASM, Disp: 30 tablet, Rfl: 0 .  DHEA 50 MG TABS, , Disp: , Rfl:  .  FLUoxetine (PROZAC) 20 MG capsule, 2  qam, Disp: 180 capsule, Rfl: 1 .  folic acid (FOLVITE) Q000111Q MCG tablet, Take 400 mcg by mouth daily., Disp: , Rfl:  .  magnesium 30 MG tablet, Take 30 mg by mouth 2 (two) times daily., Disp: , Rfl:  .  Multiple Vitamins-Minerals (MULTIVITAMIN WITH MINERALS) tablet, Take 1 tablet by mouth daily.  , Disp: , Rfl:  .  rOPINIRole (REQUIP) 0.25 MG tablet, TAKE 1 TO 2 TABLETS BY MOUTH TWICE DAILY, Disp: 120 tablet, Rfl: 0 .  traZODone (DESYREL) 50 MG tablet, Take 1 tablet (50 mg total) by mouth at bedtime., Disp: 90 tablet, Rfl: 3 .  TURMERIC CURCUMIN PO, , Disp: , Rfl:  .  vitamin C (ASCORBIC ACID) 500 MG tablet, , Disp: , Rfl:   EXAM:  VITALS per patient if applicable:  GENERAL: alert, oriented, appears well and in no acute distress  HEENT: atraumatic, conjunttiva clear, no obvious abnormalities on inspection of external nose and ears  NECK: normal movements of the head and neck  LUNGS: on inspection no signs of respiratory distress, breathing rate appears normal, no obvious gross SOB, gasping or wheezing  CV: no obvious cyanosis  MS: moves all visible extremities without noticeable abnormality  PSYCH/NEURO: pleasant and cooperative, no obvious depression or anxiety, speech and thought processing grossly intact  ASSESSMENT AND PLAN:  Discussed the following assessment and plan:  1. Neck pain -Likely muscle strain from mechanical fall.  She has been prescribed Flexeril in the past and responds well to this medication.  We will refill her  prescription for 30 tabs.  She was advised to follow-up if stiffness and pain do not resolve within the next few days.  She can continue to use her heating pad - cyclobenzaprine (FLEXERIL) 5 MG tablet; Take 1 tablet (5 mg total) by mouth 3 (three) times daily as needed for muscle spasms.  Dispense: 30 tablet; Refill: 0  2. Orthostatic hypotension -Encourage adequate hydration and to increase salt intake slightly.  Follow-up with PCP if no  improvement     I discussed the assessment and treatment plan with the patient. The patient was provided an opportunity to ask questions and all were answered. The patient agreed with the plan and demonstrated an understanding of the instructions.   The patient was advised to call back or seek an in-person evaluation if the symptoms worsen or if the condition fails to improve as anticipated.   Dorothyann Peng, NP

## 2019-12-28 ENCOUNTER — Encounter: Payer: Self-pay | Admitting: Family Medicine

## 2019-12-28 ENCOUNTER — Other Ambulatory Visit: Payer: Self-pay

## 2019-12-28 ENCOUNTER — Ambulatory Visit (INDEPENDENT_AMBULATORY_CARE_PROVIDER_SITE_OTHER): Payer: Medicare HMO | Admitting: Psychiatry

## 2019-12-28 DIAGNOSIS — R531 Weakness: Secondary | ICD-10-CM

## 2019-12-28 DIAGNOSIS — G20 Parkinson's disease: Secondary | ICD-10-CM

## 2019-12-28 DIAGNOSIS — R69 Illness, unspecified: Secondary | ICD-10-CM | POA: Diagnosis not present

## 2019-12-28 DIAGNOSIS — F339 Major depressive disorder, recurrent, unspecified: Secondary | ICD-10-CM | POA: Diagnosis not present

## 2019-12-28 DIAGNOSIS — G2581 Restless legs syndrome: Secondary | ICD-10-CM

## 2019-12-28 DIAGNOSIS — M436 Torticollis: Secondary | ICD-10-CM

## 2019-12-28 MED ORDER — FLUOXETINE HCL 20 MG PO CAPS: ORAL_CAPSULE | ORAL | 1 refills | Status: DC

## 2019-12-28 MED ORDER — TRAZODONE HCL 50 MG PO TABS: 50.0000 mg | ORAL_TABLET | Freq: Every day | ORAL | 3 refills | Status: DC

## 2019-12-28 MED ORDER — CLONAZEPAM 0.5 MG PO TABS: ORAL_TABLET | ORAL | 2 refills | Status: DC

## 2019-12-28 NOTE — Progress Notes (Signed)
Psychiatric Initial Adult Assessment   Patient Identification: Shannon Sutton MRN:  [id number] Date of Evaluation:  12/28/2019 Referral Source Dr. Alain Marion Chief Complaint:  Depression Visit Diagnosis major depression single episode in remission  History of Present Illness  Today patient is still very frustrated.  It turns out that [facility] have requested that she see Dr.Tat a movement specialist.  She apparently has seen this physician a few years ago.  Reason I am not clear of this neurologist is refusing to see her.  The patient is in care at this time with Dr.Penumali and being treated for Parkinson's.  He is starting some medicines that have not worked out.  The patient has called back and spoken to probably the physicians assistants who adjusted the dose but it still not working.  The patient was also told that they would like her to be seen by a movement specialist at [facility].  Unfortunately no referrals were made.  My recommendations were for her to call back to the physician assistants shared that the medicine is not quite working and what is the next day and also if she is going to be referred to [facility] the referral process occurred.  The patient denies really being persistently depressed.  I believe the Klonopin is helping her in a variety of ways.  She says it helps her neck pain and it clearly helps her anxiety.  I shared with her that it is used also for tremors at times sometimes.  On her last visit we increased her Prozac from 20-40 which I think has not made much of a difference.  The patient takes trazodone 50 mg that she is been taking for years.  The patient sleep is chronically afflicted.  She has broken up sleep.  She does not take naps.  The patient still is able to enjoy herself.  She drinks no alcohol uses no drugs.  She is not psychotic.  Patient is bothered a lot by movements in her hands.  She says at times it prevents her from writing and  eating.  She says it is episodic and comes and goes.  She describes a chronic state of tremble like akathisia but I do not believe it is akathisia.  It is noted the patient's had 2 falls.  I think the predominant set of symptoms this patient is experiencing is neurological in nature not psychological or psychiatric. Associated Signs/Symptoms: Depression Symptoms:  depressed mood, (Hypo) Manic Symptoms:   Anxiety Symptoms:   Psychotic Symptoms:   PTSD Symptoms:   Past Psychiatric History: Prozac, Effexor,Vybrid  Previous Psychotropic Medications: Yes   Substance Abuse History in the last 12 months:  No.  Consequences of Substance Abuse:   Past Medical History:  Past Medical History:  Diagnosis Date  . Adenomatous colon polyp 1990  . Allergy   . Anxiety 12/15/2013  . Arthritis   . Cataract   . Chronic constipation   . Depression 11/16/2012  . Diverticulitis   . Diverticulosis   . 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 External hemorrhoids   . Fibromyalgia with myofascial pain 11/16/2012  . Headaches, cluster   . Hypercholesterolemia 12/15/2013  . Hypertension   . Insomnia 11/16/2012  . Myalgia and myositis, unspecified   . Other disorder of muscle, ligament, and fascia    eosinophilic fascitis  . Other specified disease of hair and hair follicles   .  Pain in joint, multiple sites   . Restless leg syndrome 12/15/2013  . Unspecified sleep apnea   . Urine incontinence     Past Surgical History:  Procedure Laterality Date  . ABDOMINOPLASTY  2006   "tummy tuck"  . BREAST REDUCTION SURGERY  1995  . BREAST SURGERY  1981   left breast biopsy  . CATARACT EXTRACTION, BILATERAL    . MUSCLE BIOPSY  2009   DEEP TISSUE  . REDUCTION MAMMAPLASTY    . REFRACTIVE SURGERY    . TONSILLECTOMY    . TOTAL ABDOMINAL HYSTERECTOMY  1999  . WISDOM TOOTH EXTRACTION      Family Psychiatric History:   Family History:  Family  History  Problem Relation Age of Onset  . Colon cancer Father 32  . Heart disease Father   . Colon polyps Mother   . Heart disease Mother   . Heart attack Mother   . COPD Sister   . Emphysema Sister   . COPD Brother   . Emphysema Brother   . Lung cancer Brother   . Breast cancer Paternal Grandmother   . Heart disease Other        family history  . Parkinson's disease Other   . Heart disease Paternal Grandfather   . Colon polyps Paternal Grandfather   . Stomach cancer Neg Hx   . Rectal cancer Neg Hx   . Esophageal cancer Neg Hx     Social History:   Social History   Socioeconomic History  . Marital status: Divorced    Spouse name: Not on file  . Number of children: 2  . Years of education: Not on file  . Highest education level: Not on file  Occupational History  . Occupation: Retired    Fish farm manager: RETIRED    CommentAstronomer  Tobacco Use  . Smoking status: Former Smoker    Quit date: 12/19/1986    Years since quitting: 33.0  . Smokeless tobacco: Never Used  Substance and Sexual Activity  . Alcohol use: Yes    Alcohol/week: 1.0 standard drinks    Types: 1 Glasses of wine per week    Comment: 2-3 x year  . Drug use: No  . Sexual activity: Yes    Birth control/protection: Surgical, Post-menopausal  Other Topics Concern  . Not on file  Social History Narrative   Lives alone.  Retired.     Education- 2 years of college   2 daughters   caffeine daily 2-3 cups   Social Determinants of Health   Financial Resource Strain:   . Difficulty of Paying Living Expenses:   Food Insecurity:   . Worried About Charity fundraiser in the Last Year:   . Arboriculturist in the Last Year:   Transportation Needs:   . Film/video editor (Medical):   Marland Kitchen Lack of Transportation (Non-Medical):   Physical Activity:   . Days of Exercise per Week:   . Minutes of Exercise per Session:   Stress:   . Feeling of Stress :   Social Connections:   . Frequency of  Communication with Friends and Family:   . Frequency of Social Gatherings with Friends and Family:   . Attends Religious Services:   . Active Member of Clubs or Organizations:   . Attends Archivist Meetings:   Marland Kitchen Marital Status:     Additional Social History:   Allergies:   Allergies  Allergen Reactions  . Crestor [Rosuvastatin Calcium]  abd pain  . Cymbalta [Duloxetine Hcl]     Nausea   . Effexor Xr [Venlafaxine Hcl Er]     dizziness  . Morphine And Related Itching    agitation  . Pravastatin     abd pain  . Promethazine-Codeine Itching    Itching in face  . Rizatriptan Other (See Comments)    Tingling, prickly sensation, feelings of palpitations, flushing.     Metabolic Disorder Labs: Lab Results  Component Value Date   HGBA1C 5.0 02/18/2019   No results found for: PROLACTIN Lab Results  Component Value Date   CHOL 187 05/16/2019   TRIG 47 05/16/2019   HDL 94 05/16/2019   CHOLHDL 2.0 05/16/2019   VLDL 11.4 03/18/2018   LDLCALC 84 05/16/2019   LDLCALC 144 (H) 02/18/2019     Current Medications: Current Outpatient Medications  Medication Sig Dispense Refill  . atorvastatin (LIPITOR) 40 MG tablet Take 1 tablet (40 mg total) by mouth daily. 30 tablet 7  . B Complex-C (SUPER B COMPLEX) TABS Take 1 tablet by mouth daily.     . Calcium Citrate-Vitamin D (EQ CALCIUM CITRATE+D3 PO)     . clonazePAM (KLONOPIN) 0.5 MG tablet 1 bid  1  Prn for anxiety 90 tablet 2  . Coenzyme Q10 (COQ-10) 100 MG CAPS Take 1 tablet by mouth daily.     . cyclobenzaprine (FLEXERIL) 5 MG tablet Take 1 tablet (5 mg total) by mouth 3 (three) times daily as needed for muscle spasms. 30 tablet 0  . DHEA 50 MG TABS     . FLUoxetine (PROZAC) 20 MG capsule 2  qam [id number] capsule 1  . folic acid (FOLVITE) Q000111Q MCG tablet Take 400 mcg by mouth daily.    . magnesium 30 MG tablet Take 30 mg by mouth 2 (two) times daily.    . Multiple Vitamins-Minerals (MULTIVITAMIN WITH MINERALS) tablet Take  1 tablet by mouth daily.      Marland Kitchen rOPINIRole (REQUIP) 0.25 MG tablet TAKE 1 TO 2 TABLETS BY MOUTH TWICE DAILY 120 tablet 0  . traZODone (DESYREL) 50 MG tablet Take 1 tablet (50 mg total) by mouth at bedtime. 90 tablet 3  . TURMERIC CURCUMIN PO     . vitamin C (ASCORBIC ACID) 500 MG tablet      No current facility-administered medications for this visit.    Neurologic: Headache: No Seizure: No Paresthesias:No  Musculoskeletal: Strength & Muscle Tone: abnormal Gait & Station: unsteady Patient leans: N/A  Psychiatric Specialty Exam: ROS  There were no vitals taken for this visit.There is no height or weight on file to calculate BMI.  General Appearance: Fairly Groomed  Eye Contact:  Good  Speech:  Clear and Coherent  Volume:  Normal  Mood:  Depressed  Affect:  Appropriate  Thought Process:  Goal Directed  Orientation:  Full (Time, Place, and Person)  Thought Content:  WDL  Suicidal Thoughts:  No  Homicidal Thoughts:  No  Memory:  NA  Judgement:  Good  Insight:  Fair  Psychomotor Activity:  Normal  Concentration:    Recall:  [place] of Knowledge:Good  Language: Fair  Akathisia:  No  Handed:  Right  AIMS (if indicated):    Assets:  Desire for Improvement  ADL's:  Intact  Cognition: WNL  Sleep:      Treatment Plan Summary: At this time this patient's diagnosis is major depression in remission.  He takes Prozac 40 mg.  Her second problem is an adjustment  disorder with an anxious mood state.  Should continue taking the Klonopin at a fixed dose.  Her third problem is insomnia.  She will continue taking trazodone which has minimal benefits.  I do not believe the Klonopin or the trazodone has anything to do with her falls.  She has no acute neurological symptoms at this time.  I think the patient needs to be seen to have a neurological exam.  I strongly recommend that she call back her neurologist and/or make plans to see a movement disorder specialist in [place].   Patient will return to see me in 2 and half months. Jerral Ralph, MD 3/17/20214:12 PM

## 2020-01-30 ENCOUNTER — Other Ambulatory Visit: Payer: Self-pay | Admitting: Diagnostic Neuroimaging

## 2020-02-01 ENCOUNTER — Other Ambulatory Visit: Payer: Self-pay | Admitting: *Deleted

## 2020-02-01 MED ORDER — ROPINIROLE HCL 0.25 MG PO TABS: 0.2500 mg | ORAL_TABLET | Freq: Two times a day (BID) | ORAL | 11 refills | Status: DC

## 2020-02-16 DIAGNOSIS — D229 Melanocytic nevi, unspecified: Secondary | ICD-10-CM | POA: Diagnosis not present

## 2020-02-21 DIAGNOSIS — I951 Orthostatic hypotension: Secondary | ICD-10-CM | POA: Diagnosis not present

## 2020-02-21 DIAGNOSIS — T428X5D Adverse effect of antiparkinsonism drugs and other central muscle-tone depressants, subsequent encounter: Secondary | ICD-10-CM | POA: Diagnosis not present

## 2020-02-21 DIAGNOSIS — X58XXXD Exposure to other specified factors, subsequent encounter: Secondary | ICD-10-CM | POA: Diagnosis not present

## 2020-02-21 DIAGNOSIS — G20 Parkinson's disease: Secondary | ICD-10-CM | POA: Diagnosis not present

## 2020-03-27 DIAGNOSIS — G20 Parkinson's disease: Secondary | ICD-10-CM | POA: Diagnosis not present

## 2020-03-28 ENCOUNTER — Telehealth (INDEPENDENT_AMBULATORY_CARE_PROVIDER_SITE_OTHER): Payer: Medicare HMO | Admitting: Psychiatry

## 2020-03-28 ENCOUNTER — Other Ambulatory Visit: Payer: Self-pay

## 2020-03-28 ENCOUNTER — Telehealth: Payer: Self-pay | Admitting: *Deleted

## 2020-03-28 DIAGNOSIS — R69 Illness, unspecified: Secondary | ICD-10-CM | POA: Diagnosis not present

## 2020-03-28 DIAGNOSIS — F331 Major depressive disorder, recurrent, moderate: Secondary | ICD-10-CM | POA: Diagnosis not present

## 2020-03-28 MED ORDER — TRAZODONE HCL 50 MG PO TABS: 50.0000 mg | ORAL_TABLET | Freq: Every day | ORAL | 3 refills | Status: DC

## 2020-03-28 NOTE — Progress Notes (Signed)
Psychiatric Initial Adult Assessment   Patient Identification: Shannon Sutton MRN:  [id number] Date of Evaluation:  03/28/2020 Referral Source Dr. Alain Marion Chief Complaint:  Depression Visit Diagnosis major depression single episode in remission  History of Present Illness  Today the patient is only doing fairly well.  She did finally get into a different neurological clinic of movement disorder clinic at [facility].  She is being treated with L-dopa.  In some ways she says she has more energy.  Unfortunately she described herself as being dizzy.  For reasons that are not clear it is determined that she cannot go to drive without getting cleared from someplace.  Her daughter apparently is aware that the neurologist that she is limited in that she should not be driving.  The patient feels very isolated.  She feels very afflicted by her medical condition.  She describes what sounds like an action tremor.  She says her tremor overall is somewhat better but her action tremor packs her ability to eat and to write.  She has had no falls.  She has difficulty reading because of eye problems.  She watches TV.  Is hard to clearly to determine what is impacting her the most.  She implies that she is depressed only because of her circumstances.  She is sleeping and eating well.  She has a reasonable amount of energy.  She still enjoys things.  She is not suicidal I do not think he is worthless in terms of her self-esteem.  She drinks no alcohol uses no drugs and certainly is not psychotic. Associated Signs/Symptoms: Depression Symptoms:  depressed mood, (Hypo) Manic Symptoms:   Anxiety Symptoms:   Psychotic Symptoms:   PTSD Symptoms:   Past Psychiatric History: Prozac, Effexor,Vybrid  Previous Psychotropic Medications: Yes   Substance Abuse History in the last 12 months:  No.  Consequences of Substance Abuse:   Past Medical History:  Past Medical History:  Diagnosis Date  . Adenomatous colon  polyp 1990  . Allergy   . Anxiety 12/15/2013  . Arthritis   . Cataract   . Chronic constipation   . Depression 11/16/2012  . Diverticulitis   . Diverticulosis   . Eosinophilic fasciitis 06/19/6733   Recurrent  Dr Trudie Reed 2009 had a bx/MRI: was on MTX and Prednisone  . External hemorrhoids   . Fibromyalgia with myofascial pain 11/16/2012  . Headaches, cluster   . Hypercholesterolemia 12/15/2013  . Hypertension   . Insomnia 11/16/2012  . Myalgia and myositis, unspecified   . Other disorder of muscle, ligament, and fascia    eosinophilic fascitis  . Other specified disease of hair and hair follicles   . Pain in joint, multiple sites   . Restless leg syndrome 12/15/2013  . Unspecified sleep apnea   . Urine incontinence     Past Surgical History:  Procedure Laterality Date  . ABDOMINOPLASTY  2006   "tummy tuck"  . BREAST REDUCTION SURGERY  1995  . BREAST SURGERY  1981   left breast biopsy  . CATARACT EXTRACTION, BILATERAL    . MUSCLE BIOPSY  2009   DEEP TISSUE  . REDUCTION MAMMAPLASTY    . REFRACTIVE SURGERY    . TONSILLECTOMY    . TOTAL ABDOMINAL HYSTERECTOMY  1999  . WISDOM TOOTH EXTRACTION      Family Psychiatric History:   Family History:  Family History  Problem Relation Age of Onset  . Colon cancer Father 68  . Heart disease Father   . Colon polyps  Mother   . Heart disease Mother   . Heart attack Mother   . COPD Sister   . Emphysema Sister   . COPD Brother   . Emphysema Brother   . Lung cancer Brother   . Breast cancer Paternal Grandmother   . Heart disease Other        family history  . Parkinson's disease Other   . Heart disease Paternal Grandfather   . Colon polyps Paternal Grandfather   . Stomach cancer Neg Hx   . Rectal cancer Neg Hx   . Esophageal cancer Neg Hx     Social History:   Social History   Socioeconomic History  . Marital status: Divorced    Spouse name: Not on file  . Number of children: 2  . Years of education: Not on file  . Highest  education level: Not on file  Occupational History  . Occupation: Retired    Fish farm manager: RETIRED    CommentAstronomer  Tobacco Use  . Smoking status: Former Smoker    Quit date: 12/19/1986    Years since quitting: 33.2  . Smokeless tobacco: Never Used  Vaping Use  . Vaping Use: Never used  Substance and Sexual Activity  . Alcohol use: Yes    Alcohol/week: 1.0 standard drink    Types: 1 Glasses of wine per week    Comment: 2-3 x year  . Drug use: No  . Sexual activity: Yes    Birth control/protection: Surgical, Post-menopausal  Other Topics Concern  . Not on file  Social History Narrative   Lives alone.  Retired.     Education- 2 years of college   2 daughters   caffeine daily 2-3 cups   Social Determinants of Health   Financial Resource Strain:   . Difficulty of Paying Living Expenses:   Food Insecurity:   . Worried About Charity fundraiser in the Last Year:   . Arboriculturist in the Last Year:   Transportation Needs:   . Film/video editor (Medical):   Marland Kitchen Lack of Transportation (Non-Medical):   Physical Activity:   . Days of Exercise per Week:   . Minutes of Exercise per Session:   Stress:   . Feeling of Stress :   Social Connections:   . Frequency of Communication with Friends and Family:   . Frequency of Social Gatherings with Friends and Family:   . Attends Religious Services:   . Active Member of Clubs or Organizations:   . Attends Archivist Meetings:   Marland Kitchen Marital Status:     Additional Social History:   Allergies:   Allergies  Allergen Reactions  . Crestor [Rosuvastatin Calcium]     abd pain  . Cymbalta [Duloxetine Hcl]     Nausea   . Effexor Xr [Venlafaxine Hcl Er]     dizziness  . Morphine And Related Itching    agitation  . Pravastatin     abd pain  . Promethazine-Codeine Itching    Itching in face  . Rizatriptan Other (See Comments)    Tingling, prickly sensation, feelings of palpitations, flushing.      Metabolic Disorder Labs: Lab Results  Component Value Date   HGBA1C 5.0 02/18/2019   No results found for: PROLACTIN Lab Results  Component Value Date   CHOL 187 05/16/2019   TRIG 47 05/16/2019   HDL 94 05/16/2019   CHOLHDL 2.0 05/16/2019   VLDL 11.4 03/18/2018   LDLCALC 84  05/16/2019   LDLCALC 144 (H) 02/18/2019     Current Medications: Current Outpatient Medications  Medication Sig Dispense Refill  . atorvastatin (LIPITOR) 40 MG tablet Take 1 tablet (40 mg total) by mouth daily. 30 tablet 7  . B Complex-C (SUPER B COMPLEX) TABS Take 1 tablet by mouth daily.     . Calcium Citrate-Vitamin D (EQ CALCIUM CITRATE+D3 PO)     . clonazePAM (KLONOPIN) 0.5 MG tablet 1 bid  1  Prn for anxiety 90 tablet 2  . Coenzyme Q10 (COQ-10) 100 MG CAPS Take 1 tablet by mouth daily.     . cyclobenzaprine (FLEXERIL) 5 MG tablet Take 1 tablet (5 mg total) by mouth 3 (three) times daily as needed for muscle spasms. 30 tablet 0  . DHEA 50 MG TABS     . FLUoxetine (PROZAC) 20 MG capsule 2  qam 263 capsule 1  . folic acid (FOLVITE) 785 MCG tablet Take 400 mcg by mouth daily.    . magnesium 30 MG tablet Take 30 mg by mouth 2 (two) times daily.    . Multiple Vitamins-Minerals (MULTIVITAMIN WITH MINERALS) tablet Take 1 tablet by mouth daily.      Marland Kitchen rOPINIRole (REQUIP) 0.25 MG tablet Take 1-2 tablets (0.25-0.5 mg total) by mouth 2 (two) times daily. 120 tablet 11  . traZODone (DESYREL) 50 MG tablet Take 1 tablet (50 mg total) by mouth at bedtime. 90 tablet 3  . TURMERIC CURCUMIN PO     . vitamin C (ASCORBIC ACID) 500 MG tablet      No current facility-administered medications for this visit.    Neurologic: Headache: No Seizure: No Paresthesias:No  Musculoskeletal: Strength & Muscle Tone: abnormal Gait & Station: unsteady Patient leans: N/A  Psychiatric Specialty Exam: ROS  There were no vitals taken for this visit.There is no height or weight on file to calculate BMI.  General Appearance:  Fairly Groomed  Eye Contact:  Good  Speech:  Clear and Coherent  Volume:  Normal  Mood:  Depressed  Affect:  Appropriate  Thought Process:  Goal Directed  Orientation:  Full (Time, Place, and Person)  Thought Content:  WDL  Suicidal Thoughts:  No  Homicidal Thoughts:  No  Memory:  NA  Judgement:  Good  Insight:  Fair  Psychomotor Activity:  Normal  Concentration:    Recall:  [place] of Knowledge:Good  Language: Fair  Akathisia:  No  Handed:  Right  AIMS (if indicated):    Assets:  Desire for Improvement  ADL's:  Intact  Cognition: WNL  Sleep:      Treatment Plan Summary:  I believe this patient has 3 problems.  The first is that of clinical depression.  She is been on multiple antidepressants.  Presently she takes 40 mg of Prozac.  At this time my recommendations are for her to reduce it to 20 mg for 1 week and then to discontinue it.  I am not clear is having much of an effect because I think a lot of her depression is actually reactive to her circumstances.  Therefore I suspect it might be an adjustment disorder with a depressed mood state.  What is very important is to make sure that the Prozac is not causing an action tremor and contributing to her movement complaints.  She also describes dizziness.  Sense of being lightheaded.  I doubt this is related to the Prozac.  Her second problem is that of an adjustment disorder with an anxious mood  state.  She takes a small fixed dose of Klonopin 0.5 mg 1 twice a day with 1 extra if she needs it.  I doubt this is affecting her energy level or really producing any dizziness.  Her third problem is that of insomnia.  The patient takes a very low-dose of trazodone which she says helps her a great deal.  Is been taking these medicines for many years.  Now it is evident that she is getting what she thinks is better neurological care.  She is still seeing her neurologist here but I think the neurologist here together with the neurologist at  [facility] will hopefully work together.  The patient is probably going to hold off from driving but I think this is a unfortunate limitation.  Patient is already feeling somewhat isolated.  This patient she will return to see me in 2 and half months. Jerral Ralph, MD 6/16/20213:13 PM

## 2020-03-28 NOTE — Telephone Encounter (Signed)
Called patient, LVM informing her that Dr Leta Baptist received notes from Dr Deniece Ree, [facility] and because she is doing well, he said she can move FU to 6 months out. I advised will cancel her 04/02/20 FU and move it out 6 months. She'll get a reminder call before FU. Left # for any questions.

## 2020-03-28 NOTE — Telephone Encounter (Addendum)
Called patient and advised her since she has just seen neurology at [facility] and is doing well, Dr Leta Baptist felt her FU could be moved out. She stated she was prescribed slow release carb/levo 25-100 mg twice daily and is tolerating it well. I advised if she has problems or questions before FU to call and we can see her as needed. Patient verbalized understanding, appreciation.

## 2020-03-28 NOTE — Telephone Encounter (Signed)
Pt returned call and LVM asking to speak to RN to find out why her appt was changed if she was informed by her other provider that she needed to go back to her Neurologist. Please advise.

## 2020-04-02 ENCOUNTER — Ambulatory Visit: Payer: Medicare HMO | Admitting: Diagnostic Neuroimaging

## 2020-04-09 ENCOUNTER — Telehealth (HOSPITAL_COMMUNITY): Payer: Self-pay

## 2020-04-09 NOTE — Telephone Encounter (Signed)
Patient called and stated that since she's been taken off the Prozac her BP has been in the 90/60 range. Please review and advise. Thank you.

## 2020-04-11 NOTE — Telephone Encounter (Signed)
Relayed message from doctor. Also, informed patient that she was taken off Prozac because of her tremors and that it stays in her system for a month. Instructed to call us back in a month per doctor

## 2020-04-18 DIAGNOSIS — G20 Parkinson's disease: Secondary | ICD-10-CM | POA: Diagnosis not present

## 2020-04-18 DIAGNOSIS — E119 Type 2 diabetes mellitus without complications: Secondary | ICD-10-CM | POA: Diagnosis not present

## 2020-04-18 DIAGNOSIS — R42 Dizziness and giddiness: Secondary | ICD-10-CM | POA: Diagnosis not present

## 2020-04-18 DIAGNOSIS — I8393 Asymptomatic varicose veins of bilateral lower extremities: Secondary | ICD-10-CM | POA: Diagnosis not present

## 2020-04-18 DIAGNOSIS — I951 Orthostatic hypotension: Secondary | ICD-10-CM | POA: Diagnosis not present

## 2020-04-27 ENCOUNTER — Telehealth (HOSPITAL_COMMUNITY): Payer: Self-pay

## 2020-04-27 NOTE — Telephone Encounter (Signed)
Spoke with doctor and patient about going back on Prozac due to being depressed and crying spells. She stated that she has enough and that she's going to take the 20mg  for a couple weeks and if she feels she needs to go up to 40mg  (per doctor) she'll double up on the 20mg  she has and when she runs out she'll let us know. At that time I'll send in a new script for Fluoxetine (Prozac) 40mg 

## 2020-05-16 ENCOUNTER — Other Ambulatory Visit: Payer: Self-pay

## 2020-05-16 ENCOUNTER — Telehealth (INDEPENDENT_AMBULATORY_CARE_PROVIDER_SITE_OTHER): Payer: Medicare HMO | Admitting: Psychiatry

## 2020-05-16 DIAGNOSIS — R69 Illness, unspecified: Secondary | ICD-10-CM | POA: Diagnosis not present

## 2020-05-16 DIAGNOSIS — F320 Major depressive disorder, single episode, mild: Secondary | ICD-10-CM | POA: Diagnosis not present

## 2020-05-16 MED ORDER — TRAZODONE HCL 50 MG PO TABS: 50.0000 mg | ORAL_TABLET | Freq: Every day | ORAL | 3 refills | Status: DC

## 2020-05-16 MED ORDER — FLUOXETINE HCL 20 MG PO CAPS: ORAL_CAPSULE | ORAL | 1 refills | Status: DC

## 2020-05-16 MED ORDER — CLONAZEPAM 0.5 MG PO TABS: ORAL_TABLET | ORAL | 2 refills | Status: DC

## 2020-05-16 NOTE — Progress Notes (Signed)
Psychiatric Initial Adult Assessment   Patient Identification: Shannon Sutton MRN:  [id number] Date of Evaluation:  05/16/2020 Referral Source Dr. Alain Marion Chief Complaint:  Depression Visit Diagnosis major depression single episode in remission  History of Present Illness  Today the patient is at her baseline.  She is a very poor historian.  On her last visit we decided to stop her Prozac to see if it made any difference in terms of her mood or even in her tremor.  After a few weeks a month hard to tell she called back and said she was worse.  Today she is unable to describe exactly how she is worse but I think even psychologically she could not tolerate the idea of being on Prozac.  Therefore she is back on 40 mg.  What is clear is that she has been diagnosed with Parkinson's and has minimal symptoms.  She is very afflicted by this diagnosis.  Patient has minimal tremor.  Her mood is about the same which is chronic dysphoria.  However she does have days where she is much worse.  She says every week she has 2 or 3 days where she feels completely normal and well.  Her appetite is okay and she is sleeping well.  She says she has some problems thinking and concentrating.  She certainly is not suicidal.  She has somewhat of a conflicted relationship with her daughter who wants her to learn how to accept Parkinson's.  Patient described herself as being lonely and isolated with few friends.  She clearly is declining in a general way. Associated Signs/Symptoms: Depression Symptoms:  depressed mood, (Hypo) Manic Symptoms:   Anxiety Symptoms:   Psychotic Symptoms:   PTSD Symptoms:   Past Psychiatric History: Prozac, Effexor,Vybrid  Previous Psychotropic Medications: Yes   Substance Abuse History in the last 12 months:  No.  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  Dr Trudie Reed 2009 had a bx/MRI: was on MTX and Prednisone   External hemorrhoids    Fibromyalgia with myofascial pain 11/16/2012   Headaches, cluster    Hypercholesterolemia 12/15/2013   Hypertension    Insomnia 11/16/2012   Myalgia and myositis, unspecified    Other disorder of muscle, ligament, and fascia    eosinophilic fascitis   Other specified disease of hair and hair follicles    Pain in joint, multiple sites    Restless leg syndrome 12/15/2013   Unspecified sleep apnea    Urine incontinence     Past Surgical History:  Procedure Laterality Date   ABDOMINOPLASTY  2006   "tummy tuck"   [place]   left breast biopsy   CATARACT EXTRACTION, BILATERAL     MUSCLE BIOPSY  2009   DEEP TISSUE   REDUCTION MAMMAPLASTY     REFRACTIVE SURGERY     TONSILLECTOMY     TOTAL ABDOMINAL HYSTERECTOMY  1999   WISDOM TOOTH EXTRACTION      Family Psychiatric History:   Family History:  Family History  Problem Relation Age of Onset   Colon cancer Father 47   Heart disease Father    Colon  polyps Mother    Heart disease Mother    Heart attack Mother    COPD Sister    Emphysema Sister    COPD Brother    Emphysema Brother    Lung cancer Brother    Breast cancer Paternal Grandmother    Heart disease Other        family history   Parkinson's disease Other    Heart disease Paternal Grandfather    Colon polyps Paternal Grandfather    Stomach cancer Neg Hx    Rectal cancer Neg Hx    Esophageal cancer Neg Hx     Social History:   Social History   Socioeconomic History   Marital status: Divorced    Spouse name: Not on file   Number of children: 2   Years of education: Not on file   Highest education level: Not on file  Occupational History    Occupation: Retired    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  Tobacco Use   Smoking status: Former Smoker    Quit date: 12/19/1986    Years since quitting: 33.4   Smokeless tobacco: Never Used  Vaping Use   Vaping Use: Never used  Substance and Sexual Activity   Alcohol use: Yes    Alcohol/week: 1.0 standard drink    Types: 1 Glasses of wine per week    Comment: 2-3 x year   Drug use: No   Sexual activity: Yes    Birth control/protection: Surgical, Post-menopausal  Other Topics Concern   Not on file  Social History Narrative   Lives alone.  Retired.     Education- 2 years of college   2 daughters   caffeine daily 2-3 cups   Social Determinants of Health   Financial Resource Strain:    Difficulty of Paying Living Expenses:   Food Insecurity:    Worried About Charity fundraiser in the Last Year:    Arboriculturist in the Last Year:   Transportation Needs:    Film/video editor (Medical):    Lack of Transportation (Non-Medical):   Physical Activity:    Days of Exercise per Week:    Minutes of Exercise per Session:   Stress:    Feeling of Stress :   Social Connections:    Frequency of Communication with Friends and Family:    Frequency of Social Gatherings with Friends and Family:    Attends Religious Services:    Active Member of Clubs or Organizations:    Attends Archivist Meetings:    Marital Status:     Additional Social History:   Allergies:   Allergies  Allergen Reactions   Crestor [Rosuvastatin Calcium]     abd pain   Cymbalta [Duloxetine Hcl]     Nausea    Effexor Xr [Venlafaxine Hcl Er]     dizziness   Morphine And Related Itching    agitation   Pravastatin     abd pain   Promethazine-Codeine Itching    Itching in face   Rizatriptan Other (See Comments)    Tingling, prickly sensation, feelings of palpitations, flushing.     Metabolic Disorder Labs: Lab Results  Component Value  Date   HGBA1C 5.0 02/18/2019   No results found for: PROLACTIN Lab Results  Component Value Date   CHOL 187 05/16/2019   TRIG 47 05/16/2019   HDL 94 05/16/2019   CHOLHDL 2.0 05/16/2019   VLDL 11.4 03/18/2018   LDLCALC  84 05/16/2019   LDLCALC 144 (H) 02/18/2019     Current Medications: Current Outpatient Medications  Medication Sig Dispense Refill   atorvastatin (LIPITOR) 40 MG tablet Take 1 tablet (40 mg total) by mouth daily. 30 tablet 7   B Complex-C (SUPER B COMPLEX) TABS Take 1 tablet by mouth daily.      Calcium Citrate-Vitamin D (EQ CALCIUM CITRATE+D3 PO)      Carbidopa-Levodopa ER (SINEMET CR) 25-100 MG tablet controlled release Take 1 tablet by mouth 2 (two) times daily. Slow release     clonazePAM (KLONOPIN) 0.5 MG tablet 1 bid  1  Prn for anxiety 90 tablet 2   Coenzyme Q10 (COQ-10) 100 MG CAPS Take 1 tablet by mouth daily.      cyclobenzaprine (FLEXERIL) 5 MG tablet Take 1 tablet (5 mg total) by mouth 3 (three) times daily as needed for muscle spasms. 30 tablet 0   DHEA 50 MG TABS      FLUoxetine (PROZAC) 20 MG capsule 2  qam 716 capsule 1   folic acid (FOLVITE) 967 MCG tablet Take 400 mcg by mouth daily.     magnesium 30 MG tablet Take 30 mg by mouth 2 (two) times daily.     Multiple Vitamins-Minerals (MULTIVITAMIN WITH MINERALS) tablet Take 1 tablet by mouth daily.       rOPINIRole (REQUIP) 0.25 MG tablet Take 1-2 tablets (0.25-0.5 mg total) by mouth 2 (two) times daily. 120 tablet 11   traZODone (DESYREL) 50 MG tablet Take 1 tablet (50 mg total) by mouth at bedtime. 90 tablet 3   TURMERIC CURCUMIN PO      vitamin C (ASCORBIC ACID) 500 MG tablet      No current facility-administered medications for this visit.    Neurologic: Headache: No Seizure: No Paresthesias:No  Musculoskeletal: Strength & Muscle Tone: abnormal Gait & Station: unsteady Patient leans: N/A  Psychiatric Specialty Exam: ROS  There were no vitals taken for this visit.There  is no height or weight on file to calculate BMI.  General Appearance: Fairly Groomed  Eye Contact:  Good  Speech:  Clear and Coherent  Volume:  Normal  Mood:  Depressed  Affect:  Appropriate  Thought Process:  Goal Directed  Orientation:  Full (Time, Place, and Person)  Thought Content:  WDL  Suicidal Thoughts:  No  Homicidal Thoughts:  No  Memory:  NA  Judgement:  Good  Insight:  Fair  Psychomotor Activity:  Normal  Concentration:    Recall:  [place] of Knowledge:Good  Language: Fair  Akathisia:  No  Handed:  Right  AIMS (if indicated):    Assets:  Desire for Improvement  ADL's:  Intact  Cognition: WNL  Sleep:      Treatment Plan Summary:  8/4/20213:31 PM   This patient is diagnosed with major depression.  Not clear she fits the exact definition but for now we will continue the 40 mg of Prozac.  Her second problem is that of insomnia and she takes trazodone for that and is very helpful.  Her third problem is an adjustment disorder with an anxious mood related to dealing with Parkinson's diagnosis.  She will continue taking low-dose Klonopin as prescribed.  We clarified that she should generally just taking 1 in the morning possibly in the afternoon instead and 1 at night.  She should take 1 extra if she needs it.  In general I like her just to take approximately 2 a day.  Most importantly we made  a recommendation for to see Mrs. Dahlia Bailiff a talent psychotherapist at [facility].  We also recommended that she look out for a Parkinson's support group.  Patient return to see me in a couple of months.

## 2020-05-22 ENCOUNTER — Telehealth: Payer: Self-pay | Admitting: Cardiology

## 2020-05-22 DIAGNOSIS — E785 Hyperlipidemia, unspecified: Secondary | ICD-10-CM

## 2020-05-22 NOTE — Telephone Encounter (Signed)
Patient states she has not gotten lab work done in a long time and would like a lipid panel done before her appointment 06/22/2020.

## 2020-05-22 NOTE — Telephone Encounter (Signed)
Will route to MD, to advise if we can go ahead and order lab work before appointment on 09/10.  If so, which other labs would you like and I can get it ordered.  Thank you!

## 2020-05-25 ENCOUNTER — Ambulatory Visit: Payer: Medicare HMO | Admitting: Cardiology

## 2020-05-25 NOTE — Telephone Encounter (Signed)
Pt state she would prefer to have labs drawn day of her appointment.

## 2020-05-25 NOTE — Telephone Encounter (Signed)
She will be due for a lipid panel at that time. If she would like to come and get that before the appt, that would be great. Thanks.

## 2020-06-12 DIAGNOSIS — F411 Generalized anxiety disorder: Secondary | ICD-10-CM | POA: Diagnosis not present

## 2020-06-12 DIAGNOSIS — R69 Illness, unspecified: Secondary | ICD-10-CM | POA: Diagnosis not present

## 2020-06-19 DIAGNOSIS — Z885 Allergy status to narcotic agent status: Secondary | ICD-10-CM | POA: Diagnosis not present

## 2020-06-19 DIAGNOSIS — Z79899 Other long term (current) drug therapy: Secondary | ICD-10-CM | POA: Diagnosis not present

## 2020-06-19 DIAGNOSIS — I951 Orthostatic hypotension: Secondary | ICD-10-CM | POA: Diagnosis not present

## 2020-06-19 DIAGNOSIS — G259 Extrapyramidal and movement disorder, unspecified: Secondary | ICD-10-CM | POA: Diagnosis not present

## 2020-06-19 DIAGNOSIS — Z888 Allergy status to other drugs, medicaments and biological substances status: Secondary | ICD-10-CM | POA: Diagnosis not present

## 2020-06-19 DIAGNOSIS — G20 Parkinson's disease: Secondary | ICD-10-CM | POA: Diagnosis not present

## 2020-06-20 ENCOUNTER — Other Ambulatory Visit: Payer: Self-pay

## 2020-06-20 DIAGNOSIS — E785 Hyperlipidemia, unspecified: Secondary | ICD-10-CM | POA: Diagnosis not present

## 2020-06-20 LAB — LIPID PANEL
Chol/HDL Ratio: 1.9 ratio (ref 0.0–4.4)
Cholesterol, Total: 171 mg/dL (ref 100–199)
HDL: 89 mg/dL (ref >39)
LDL Chol Calc (NIH): 69 mg/dL (ref 0–99)
Triglycerides: 65 mg/dL (ref 0–149)
VLDL Cholesterol Cal: 13 mg/dL (ref 5–40)

## 2020-06-20 NOTE — Addendum Note (Signed)
Addended by: Willeen Cass A on: 06/20/2020 09:22 AM   Modules accepted: Orders

## 2020-06-20 NOTE — Telephone Encounter (Signed)
Patient is following up regarding orders for lab work. She is requesting to come in this morning, as she has fasted since last night. However, orders have not yet been placed. Please assist.

## 2020-06-20 NOTE — Telephone Encounter (Signed)
Call placed to Pt.  Advised ok to come in for lab work today.  Order entered for lipid profile.

## 2020-06-22 ENCOUNTER — Ambulatory Visit: Payer: Medicare HMO | Admitting: Cardiology

## 2020-06-22 ENCOUNTER — Encounter: Payer: Self-pay | Admitting: Cardiology

## 2020-06-22 ENCOUNTER — Other Ambulatory Visit: Payer: Self-pay

## 2020-06-22 VITALS — BP 132/84 | HR 74 | Ht 61.5 in | Wt 132.8 lb

## 2020-06-22 DIAGNOSIS — I951 Orthostatic hypotension: Secondary | ICD-10-CM

## 2020-06-22 DIAGNOSIS — Z7189 Other specified counseling: Secondary | ICD-10-CM

## 2020-06-22 DIAGNOSIS — E7800 Pure hypercholesterolemia, unspecified: Secondary | ICD-10-CM

## 2020-06-22 DIAGNOSIS — Z9189 Other specified personal risk factors, not elsewhere classified: Secondary | ICD-10-CM

## 2020-06-22 DIAGNOSIS — G20 Parkinson's disease: Secondary | ICD-10-CM | POA: Diagnosis not present

## 2020-06-22 MED ORDER — ATORVASTATIN CALCIUM 40 MG PO TABS: 40.0000 mg | ORAL_TABLET | Freq: Every day | ORAL | 3 refills | Status: DC

## 2020-06-22 NOTE — Patient Instructions (Signed)

## 2020-06-22 NOTE — Progress Notes (Signed)
Cardiology Office Note:    Date:  06/22/2020   ID:  Shannon Sutton, DOB [birth year], MRN [id number]  PCP:  Shannon Sutton, Shannon Patricia, MD  Cardiologist:  Shannon Red, MD PhD  Referring MD: Shannon Sutton, Estel*   CC: follow up  History of Present Illness:    Shannon Sutton is a 74 y.o. female with a hx of eosinophilic fasciitis, Parkinson's disease, depression who is seen in follow up for dizziness, fatigue. Her initial consult with me was on 05/20/18. Summary of that visit was related to symptoms that started in 02/2017; progressive fatigue, lightheadedness with exertion and changing position. No syncope. Happens every day. Lasts all day long. Very light sensitive. She does have a history of autoimmune disease (eosinophilic fascitis). This was prior to her Parkinson's diagnosis.  Today: Light sensitive today. Struggling with Parkinson diagnosis, started on sinemet. Feels like she can't get enough air constantly. Feels constantly lightheaded, fatigued. Has had noted orthostasis/low blood pressures, seeing a specialist in [facility] for this. Wearing compression stockings, struggled with thigh high, wears knee highs most days.  Has gotten back to the gym, doing treadmill/weights three times/week.   Denies chest pain. No PND, orthopnea, LE edema or unexpected weight gain. No syncope, rare palpitations.  ROS otherwise positive for nausea some afternoons.  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   Recurrent  Dr Shannon Sutton 2009 had a bx/MRI: was on MTX and Prednisone   External hemorrhoids    Fibromyalgia with myofascial pain 11/16/2012   Headaches, cluster    Hypercholesterolemia 12/15/2013   Hypertension    Insomnia 11/16/2012   Myalgia and myositis, unspecified     Other disorder of muscle, ligament, and fascia    eosinophilic fascitis   Other specified disease of hair and hair follicles    Pain in joint, multiple sites    Restless leg syndrome 12/15/2013   Unspecified sleep apnea    Urine incontinence     Past Surgical History:  Procedure Laterality Date   ABDOMINOPLASTY  2006   "tummy tuck"   BREAST REDUCTION SURGERY  1995   BREAST SURGERY  1981   left breast biopsy   CATARACT EXTRACTION, BILATERAL     MUSCLE BIOPSY  2009   DEEP TISSUE   REDUCTION MAMMAPLASTY     REFRACTIVE SURGERY     TONSILLECTOMY     TOTAL ABDOMINAL HYSTERECTOMY  1999   WISDOM TOOTH EXTRACTION      Current Medications: Current Outpatient Medications on File Prior to Visit  Medication Sig   atorvastatin (LIPITOR) 40 MG tablet Take 1 tablet (40 mg total) by mouth daily.   B Complex-C (SUPER B COMPLEX) TABS Take 1 tablet by mouth daily.    Calcium Citrate-Vitamin D (EQ CALCIUM CITRATE+D3 PO)    clonazePAM (KLONOPIN) 0.5 MG tablet 1 bid  1  Prn for anxiety   Coenzyme Q10 (COQ-10) 100 MG CAPS Take 1 tablet by mouth daily.    DHEA 50 MG TABS    FLUoxetine (PROZAC) 20 MG capsule 2  qam   folic acid (FOLVITE) 800 MCG tablet Take 400 mcg by mouth daily.   magnesium 30 MG tablet Take 30 mg by mouth 2 (two) times daily.    Multiple Vitamins-Minerals (MULTIVITAMIN  WITH MINERALS) tablet Take 1 tablet by mouth daily.     rOPINIRole (REQUIP) 0.25 MG tablet Take 1-2 tablets (0.25-0.5 mg total) by mouth 2 (two) times daily.   traZODone (DESYREL) 50 MG tablet Take 1 tablet (50 mg total) by mouth at bedtime.   TURMERIC CURCUMIN PO    vitamin C (ASCORBIC ACID) 500 MG tablet    Carbidopa-Levodopa ER (SINEMET CR) 25-100 MG tablet controlled release Take 1 tablet by mouth 2 (two) times daily. Slow release    No current facility-administered medications on file prior to visit.     Allergies:   Crestor [rosuvastatin calcium], Cymbalta [duloxetine hcl],  Effexor xr [venlafaxine hcl er], Morphine and related, Pravastatin, Promethazine-codeine, and Rizatriptan   Social History   Tobacco Use   Smoking status: Former Smoker    Quit date: 12/19/1986    Years since quitting: 33.6   Smokeless tobacco: Never Used  Vaping Use   Vaping Use: Never used  Substance Use Topics   Alcohol use: Yes    Alcohol/week: 1.0 standard drink    Types: 1 Glasses of wine per week    Comment: 2-3 x year   Drug use: No    Family History: The patient's family history includes Breast cancer in her paternal grandmother; COPD in her brother and sister; Colon cancer (age of onset: 72) in her father; Colon polyps in her mother and paternal grandfather; Emphysema in her brother and sister; Heart attack in her mother; Heart disease in her father, mother, paternal grandfather, and another family member; Lung cancer in her brother; Parkinson's disease in an other family member. There is no history of Stomach cancer, Rectal cancer, or Esophageal cancer.  ROS:   Please see the history of present illness.  Additional pertinent ROS otherwise unremarkable.  EKGs/Labs/Other Studies Reviewed:    The following studies were reviewed today: Recent PCP notes  EKG:  EKG is personally reviewed.  The ekg ordered today demonstrates normal sinus rhythm at 74 bpm, low voltage  Recent Labs: No results found for requested labs within last 8760 hours.  Recent Lipid Panel    Component Value Date/Time   CHOL 171 06/20/2020 1006   TRIG 65 06/20/2020 1006   HDL 89 06/20/2020 1006   CHOLHDL 1.9 06/20/2020 1006   CHOLHDL 3 03/18/2018 0814   VLDL 11.4 03/18/2018 0814   LDLCALC 69 06/20/2020 1006    Physical Exam:    VS:  BP 132/84    Pulse 74    Ht 5' 1.5" (1.562 m)    Wt 132 lb 12.8 oz (60.2 kg)    BMI 24.69 kg/m     Wt Readings from Last 3 Encounters:  06/22/20 132 lb 12.8 oz (60.2 kg)  11/16/19 132 lb (59.9 kg)  09/29/19 132 lb 1.6 oz (59.9 kg)    GEN: Well nourished,  well developed in no acute distress HEENT: Normal, moist mucous membranes NECK: No JVD CARDIAC: regular rhythm, normal S1 and S2, no rubs or gallops. No murmur. VASCULAR: Radial and DP pulses 2+ bilaterally. No carotid bruits RESPIRATORY:  Clear to auscultation without rales, wheezing or rhonchi  ABDOMEN: Soft, non-tender, non-distended MUSCULOSKELETAL:  Ambulates independently SKIN: Warm and dry, no edema NEUROLOGIC:  Alert and oriented x 3. No focal neuro deficits noted. PSYCHIATRIC:  Normal affect   ASSESSMENT:    1. Hypercholesteremia   2. Parkinson disease (HCC)   3. Orthostatic hypotension   4. At increased risk for cardiovascular disease   5. Cardiac risk counseling  6. Counseling on health promotion and disease prevention    PLAN:    History of orthostatic hypotension, with rare occasional hypertension:  -has been less labile recently -no orthostasis on prior exam. Did note that this can be part of the Parkinson's syndrome. She will monitor.  Hypercholesterolemia Elevated ASCVD risk  -LDL 144 in 02/2019.  -Started on atorvastatin, denies symptoms -repeat LDL 84 05/2019  CV risk counseling and primary prevention: -recommend heart healthy/Mediterranean diet, with whole grains, fruits, vegetable, fish, lean meats, nuts, and olive oil. Limit salt. -recommend moderate walking, 3-5 times/week for 30-50 minutes each session. Aim for at least 150 minutes.week. Goal should be pace of 3 miles/hours, or walking 1.5 miles in 30 minutes -recommend avoidance of tobacco products. Avoid excess alcohol. -Additional risk factor control:  -Diabetes: A1c is 5.0 per [facility]  -Weight: BMI 24, at goal -ASCVD risk score: The 10-year ASCVD risk score Denman George DC Montez Hageman., et al., 2013) is: 19.1%   Values used to calculate the score:     Age: 28 years     Sex: Female     Is Non-Hispanic African American: No     Diabetic: No     Tobacco smoker: No     Systolic Blood Pressure: 132 mmHg     Is BP  treated: No     HDL Cholesterol: 89 mg/dL     Total Cholesterol: 171 mg/dL   Plan for follow up: 1 year or sooner PRN  Medication Adjustments/Labs and Tests Ordered: Current medicines are reviewed at length with the patient today.  Concerns regarding medicines are outlined above.  Orders Placed This Encounter  Procedures   EKG 12-Lead   Meds ordered this encounter  Medications   atorvastatin (LIPITOR) 40 MG tablet    Sig: Take 1 tablet (40 mg total) by mouth daily.    Dispense:  90 tablet    Refill:  3    Patient Instructions  Medication Instructions:  Your Physician recommend you continue on your current medication as directed.    *If you need a refill on your cardiac medications before your next appointment, please call your pharmacy*   Lab Work: None ordered  Testing/Procedures: None ordered    Follow-Up: At [facility], you and your health needs are our priority.  As part of our continuing mission to provide you with exceptional heart care, we have created designated Provider Care Teams.  These Care Teams include your primary Cardiologist (physician) and Advanced Practice Providers (APPs -  Physician Assistants and Nurse Practitioners) who all work together to provide you with the care you need, when you need it.  We recommend signing up for the patient portal called "MyChart".  Sign up information is provided on this After Visit Summary.  MyChart is used to connect with patients for Virtual Visits (Telemedicine).  Patients are able to view lab/test results, encounter notes, upcoming appointments, etc.  Non-urgent messages can be sent to your provider as well.   To learn more about what you can do with MyChart, go to [email or web address].    Your next appointment:   1 year(s)  The format for your next appointment:   In Person  Provider:   Jodelle Red, MD       Signed, Shannon Red, MD PhD 06/22/2020  [facility]  [facility]

## 2020-07-04 DIAGNOSIS — R69 Illness, unspecified: Secondary | ICD-10-CM | POA: Diagnosis not present

## 2020-07-26 DIAGNOSIS — H0102A Squamous blepharitis right eye, upper and lower eyelids: Secondary | ICD-10-CM | POA: Diagnosis not present

## 2020-07-26 DIAGNOSIS — H0102B Squamous blepharitis left eye, upper and lower eyelids: Secondary | ICD-10-CM | POA: Diagnosis not present

## 2020-07-26 DIAGNOSIS — H35371 Puckering of macula, right eye: Secondary | ICD-10-CM | POA: Diagnosis not present

## 2020-07-26 DIAGNOSIS — Z961 Presence of intraocular lens: Secondary | ICD-10-CM | POA: Diagnosis not present

## 2020-07-26 DIAGNOSIS — H16223 Keratoconjunctivitis sicca, not specified as Sjogren's, bilateral: Secondary | ICD-10-CM | POA: Diagnosis not present

## 2020-07-26 DIAGNOSIS — H10413 Chronic giant papillary conjunctivitis, bilateral: Secondary | ICD-10-CM | POA: Diagnosis not present

## 2020-08-07 ENCOUNTER — Telehealth: Payer: Self-pay | Admitting: *Deleted

## 2020-08-07 NOTE — Telephone Encounter (Signed)
Patient called nurse traige on 08/06/2020. Patient reports she  wants to schedule in person appointment. States she is having dizziness and shortness of breath, symptoms have been going on for a while- a month or longer. States she has told her neurologist and Parkinson's doctor, but they don't listen to her. States she has seen an ENT for the dizziness and it is not vertigo. Patietn was advised to go to ED but refused.   Please advise

## 2020-08-07 NOTE — Telephone Encounter (Signed)
Attempted to call patient but the mailbox is not currently taking messages.

## 2020-08-07 NOTE — Telephone Encounter (Signed)
Ok to schedule visit

## 2020-08-10 NOTE — Telephone Encounter (Signed)
Patient has scheduled an appointment 

## 2020-08-13 ENCOUNTER — Encounter: Payer: Self-pay | Admitting: Cardiology

## 2020-08-14 ENCOUNTER — Encounter: Payer: Self-pay | Admitting: Internal Medicine

## 2020-08-14 ENCOUNTER — Other Ambulatory Visit: Payer: Self-pay

## 2020-08-14 ENCOUNTER — Ambulatory Visit (INDEPENDENT_AMBULATORY_CARE_PROVIDER_SITE_OTHER): Payer: Medicare HMO | Admitting: Internal Medicine

## 2020-08-14 VITALS — BP 120/80 | HR 69 | Temp 98.0°F | Wt 136.6 lb

## 2020-08-14 DIAGNOSIS — R42 Dizziness and giddiness: Secondary | ICD-10-CM

## 2020-08-14 NOTE — Patient Instructions (Signed)
-  Nice seeing you today!!  -Lab work today; will notify you once results are available.  -Schedule follow up in 3 months for your physical. Please come in fasting that day.

## 2020-08-14 NOTE — Progress Notes (Signed)
Established Patient Office Visit     This visit occurred during the SARS-CoV-2 public health emergency.  Safety protocols were in place, including screening questions prior to the visit, additional usage of staff PPE, and extensive cleaning of exam room while observing appropriate contact time as indicated for disinfecting solutions.    CC/Reason for Visit: Discuss dizziness  HPI: Shannon Sutton is a 74 y.o. female who is coming in today for the above mentioned reasons. Past Medical History is significant for: Hyperlipidemia well-controlled on a statin, history of depression and anxiety on fluoxetine and clonazepam prescribed by psychiatry, history of parkinsonism and restless leg syndrome followed by neurology on Requip and Sinemet. She also has a history of chronic constipation for which she takes Linzess and Fibromyalgia. She has a longstanding h/o dizziness that predates even her diagnosis of parkinson's disease. She has had vertigo before but this does not feel like it. Not a sensation of the room spinning around her. She describes more of a sensation of feeling unbalanced on her feet and quite fatigued, more than usual. She has seen ENT without resolution. She has seen vascular as she has been diagnosed with orthostatic hypotension and was asked to wear compression stockings which she doesn't like to as she believes it makes her leg pain worse. She has touched based with her neurologist and she is possibly getting a repeat MRI soon per her report. No vision issues.   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  Dr Trudie Reed 2009 had a bx/MRI: was on MTX and Prednisone   External hemorrhoids    Fibromyalgia with myofascial pain  11/16/2012   Headaches, cluster    Hypercholesterolemia 12/15/2013   Hypertension    Insomnia 11/16/2012   Myalgia and myositis, unspecified    Other disorder of muscle, ligament, and fascia    eosinophilic fascitis   Other specified disease of hair and hair follicles    Pain in joint, multiple sites    Restless leg syndrome 12/15/2013   Unspecified sleep apnea    Urine incontinence     Past Surgical History:  Procedure Laterality Date   ABDOMINOPLASTY  2006   "tummy tuck"   [place]   left breast biopsy   CATARACT EXTRACTION, BILATERAL     MUSCLE BIOPSY  2009   DEEP TISSUE   REDUCTION MAMMAPLASTY     REFRACTIVE SURGERY     TONSILLECTOMY     TOTAL ABDOMINAL HYSTERECTOMY  1999   WISDOM TOOTH EXTRACTION      Social History:  reports that she quit smoking about 33 years ago. She has never used smokeless tobacco. She reports current alcohol use of about 1.0 standard drink of alcohol per week. She reports that she does not use drugs.  Allergies: Allergies  Allergen Reactions   Crestor [Rosuvastatin Calcium]     abd pain   Cymbalta [Duloxetine Hcl]     Nausea    Effexor Xr [Venlafaxine Hcl Er]     dizziness   Morphine And Related Itching    agitation   Pravastatin     abd pain   Promethazine-Codeine Itching    Itching  in face   Rizatriptan Other (See Comments)    Tingling, prickly sensation, feelings of palpitations, flushing.     Family History:  Family History  Problem Relation Age of Onset   Colon cancer Father 76   Heart disease Father    Colon polyps Mother    Heart disease Mother    Heart attack Mother    COPD Sister    Emphysema Sister    COPD Brother    Emphysema Brother    Lung cancer Brother    Breast cancer Paternal Grandmother    Heart disease Other        family history   Parkinson's disease Other    Heart disease Paternal Grandfather    Colon polyps Paternal  Grandfather    Stomach cancer Neg Hx    Rectal cancer Neg Hx    Esophageal cancer Neg Hx      Current Outpatient Medications:    atorvastatin (LIPITOR) 40 MG tablet, Take 1 tablet (40 mg total) by mouth daily., Disp: 90 tablet, Rfl: 3   B Complex-C (SUPER B COMPLEX) TABS, Take 1 tablet by mouth daily. , Disp: , Rfl:    Carbidopa-Levodopa ER (SINEMET CR) 25-100 MG tablet controlled release, Take 1 tablet by mouth 2 (two) times daily. Slow release , Disp: , Rfl:    clonazePAM (KLONOPIN) 0.5 MG tablet, 1 bid  1  Prn for anxiety, Disp: 90 tablet, Rfl: 2   magnesium 30 MG tablet, Take 30 mg by mouth 2 (two) times daily. , Disp: , Rfl:    Multiple Vitamins-Minerals (MULTIVITAMIN WITH MINERALS) tablet, Take 1 tablet by mouth daily.  , Disp: , Rfl:    rOPINIRole (REQUIP) 0.25 MG tablet, Take 1-2 tablets (0.25-0.5 mg total) by mouth 2 (two) times daily., Disp: 120 tablet, Rfl: 11   traZODone (DESYREL) 50 MG tablet, Take 1 tablet (50 mg total) by mouth at bedtime., Disp: 90 tablet, Rfl: 3  Review of Systems:  Constitutional: Denies fever, chills, diaphoresis, appetite change. HEENT: Denies photophobia, eye pain, redness, hearing loss, ear pain, congestion, sore throat, rhinorrhea, sneezing, mouth sores, trouble swallowing, neck pain, neck stiffness and tinnitus.   Respiratory: Denies chest tightness,  and wheezing.   Cardiovascular: Denies chest pain, palpitations and leg swelling.  Gastrointestinal: Denies nausea, vomiting, abdominal pain, diarrhea, constipation, blood in stool and abdominal distention.  Genitourinary: Denies dysuria, urgency, frequency, hematuria, flank pain and difficulty urinating.  Endocrine: Denies: hot or cold intolerance, sweats, changes in hair or nails, polyuria, polydipsia. Musculoskeletal: Denies myalgias, back pain, joint swelling, arthralgias. Skin: Denies pallor, rash and wound.  Neurological: Denies seizures, syncope,  numbness and headaches.    Hematological: Denies adenopathy. Easy bruising, personal or family bleeding history  Psychiatric/Behavioral: Denies suicidal ideation, mood changes, confusion, nervousness, sleep disturbance and agitation    Physical Exam: Vitals:   08/14/20 1533  BP: 120/80  Pulse: 69  Temp: 98 F (36.7 C)  TempSrc: Oral  SpO2: 95%  Weight: 136 lb 9.6 oz (62 kg)    Body mass index is 25.39 kg/m.   Constitutional: NAD, calm, comfortable Eyes: PERRL, lids and conjunctivae normal ENMT: Mucous membranes are moist.  Respiratory: clear to auscultation bilaterally, no wheezing, no crackles. Normal respiratory effort. No accessory muscle use.  Cardiovascular: Regular rate and rhythm, no murmurs / rubs / gallops. No extremity edema.   Psychiatric: Normal judgment and insight. Alert and oriented x 3. Normal mood.    Impression and Plan:  Dizziness -Not sure what else  we can offer given this is a long-standing issue with extensive work up in the past. -Check CBC/CMET/TSH/Vit D and Vit B12 to see if those can provide some insight. -Advised continued follow up with her neurologist.   Patient Instructions  -Nice seeing you today!!  -Lab work today; will notify you once results are available.  -Schedule follow up in 3 months for your physical. Please come in fasting that day.     Lelon Frohlich, MD [facility]

## 2020-08-15 ENCOUNTER — Other Ambulatory Visit: Payer: Self-pay | Admitting: Internal Medicine

## 2020-08-15 ENCOUNTER — Telehealth (INDEPENDENT_AMBULATORY_CARE_PROVIDER_SITE_OTHER): Payer: Medicare HMO | Admitting: Psychiatry

## 2020-08-15 ENCOUNTER — Encounter: Payer: Self-pay | Admitting: Internal Medicine

## 2020-08-15 DIAGNOSIS — F4323 Adjustment disorder with mixed anxiety and depressed mood: Secondary | ICD-10-CM

## 2020-08-15 DIAGNOSIS — E559 Vitamin D deficiency, unspecified: Secondary | ICD-10-CM

## 2020-08-15 DIAGNOSIS — R69 Illness, unspecified: Secondary | ICD-10-CM | POA: Diagnosis not present

## 2020-08-15 LAB — COMPREHENSIVE METABOLIC PANEL
AG Ratio: 2.1 (calc) (ref 1.0–2.5)
ALT: 5 U/L — ABNORMAL LOW (ref 6–29)
AST: 25 U/L (ref 10–35)
Albumin: 4.4 g/dL (ref 3.6–5.1)
Alkaline phosphatase (APISO): 68 U/L (ref 37–153)
BUN: 10 mg/dL (ref 7–25)
CO2: 27 mmol/L (ref 20–32)
Calcium: 9.3 mg/dL (ref 8.6–10.4)
Chloride: 101 mmol/L (ref 98–110)
Creat: 0.75 mg/dL (ref 0.60–0.93)
Globulin: 2.1 g/dL (calc) (ref 1.9–3.7)
Glucose, Bld: 84 mg/dL (ref 65–99)
Potassium: 4.1 mmol/L (ref 3.5–5.3)
Sodium: 138 mmol/L (ref 135–146)
Total Bilirubin: 0.7 mg/dL (ref 0.2–1.2)
Total Protein: 6.5 g/dL (ref 6.1–8.1)

## 2020-08-15 LAB — CBC WITH DIFFERENTIAL/PLATELET
Absolute Monocytes: 522 cells/uL (ref 200–950)
Basophils Absolute: 52 cells/uL (ref 0–200)
Basophils Relative: 0.9 %
Eosinophils Absolute: 220 cells/uL (ref 15–500)
Eosinophils Relative: 3.8 %
HCT: 40.6 % (ref 35.0–45.0)
Hemoglobin: 13.5 g/dL (ref 11.7–15.5)
Lymphs Abs: 2361 cells/uL (ref 850–3900)
MCH: 31.1 pg (ref 27.0–33.0)
MCHC: 33.3 g/dL (ref 32.0–36.0)
MCV: 93.5 fL (ref 80.0–100.0)
MPV: 10.1 fL (ref 7.5–12.5)
Monocytes Relative: 9 %
Neutro Abs: 2645 cells/uL (ref 1500–7800)
Neutrophils Relative %: 45.6 %
Platelets: 238 10*3/uL (ref 140–400)
RBC: 4.34 10*6/uL (ref 3.80–5.10)
RDW: 11.8 % (ref 11.0–15.0)
Total Lymphocyte: 40.7 %
WBC: 5.8 10*3/uL (ref 3.8–10.8)

## 2020-08-15 LAB — TSH: TSH: 2.18 mIU/L (ref 0.40–4.50)

## 2020-08-15 LAB — VITAMIN B12: Vitamin B-12: 702 pg/mL (ref 200–1100)

## 2020-08-15 LAB — VITAMIN D 25 HYDROXY (VIT D DEFICIENCY, FRACTURES): Vit D, 25-Hydroxy: 28 ng/mL — ABNORMAL LOW (ref 30–100)

## 2020-08-15 MED ORDER — TRAZODONE HCL 50 MG PO TABS
50.0000 mg | ORAL_TABLET | Freq: Every day | ORAL | 3 refills | Status: DC
Start: 2020-08-15 — End: 2020-12-12

## 2020-08-15 MED ORDER — VITAMIN D (ERGOCALCIFEROL) 1.25 MG (50000 UNIT) PO CAPS: 50000.0000 [IU] | ORAL_CAPSULE | ORAL | 0 refills | Status: AC

## 2020-08-15 MED ORDER — CLONAZEPAM 0.5 MG PO TABS
ORAL_TABLET | ORAL | 4 refills | Status: DC
Start: 2020-08-15 — End: 2020-12-12

## 2020-08-15 NOTE — Progress Notes (Signed)
Psychiatric Initial Adult Assessment   Patient Identification: ELLIETT GUARISCO MRN:  [id number] Date of Evaluation:  08/15/2020 Referral Source Dr. Alain Marion Chief Complaint:  Depression Visit Diagnosis major depression single episode in remission  History of Present Illness  Overall the patient is doing fairly well.  We reviewed very closely the issue of major depression.  In reality I do not think this patient actually has major depression.  She herself stopped her Prozac again on her last visit despite the fact that I told her to increase it to 40.  She does not feel any different off of it.  In fact she denies ever having a clear episode of major depression in the past.  I think she has a reaction to finding out that she has Parkinson's and having difficulty handling.  She also has restless legs which is well treated at this time.  The patient did Make contact with Doristine Mango field seeing her 1 time.  The patient does describe herself as somewhat isolated.  On the other hand she goes to the gym a few times a week.  She says the Klonopin at night and during the day is very helpful.  She is on a very low-dose.  She also takes trazodone 50 mg or less.  Generally with this 15 she is sleeping and eating pretty well.  Her energy is good enough that she goes to the gym.  She still enjoys things like the gym like television like her dog.  She does not describe herself as being worthless and she certainly is not suicidal.  Her thinking concentration is reasonable as she lives independently without problem. Depression Symptoms:  depressed mood, (Hypo) Manic Symptoms:   Anxiety Symptoms:   Psychotic Symptoms:   PTSD Symptoms:   Past Psychiatric History: Prozac, Effexor,Vybrid  Previous Psychotropic Medications: Yes   Substance Abuse History in the last 12 months:  No.  Consequences of Substance Abuse:   Past Medical History:  Past Medical History:  Diagnosis Date  . Adenomatous colon polyp 1990   . Allergy   . Anxiety 12/15/2013  . Arthritis   . Cataract   . Chronic constipation   . Depression 11/16/2012  . Diverticulitis   . Diverticulosis   . Eosinophilic fasciitis 12/13/3555   Recurrent  Dr Trudie Reed 2009 had a bx/MRI: was on MTX and Prednisone  . External hemorrhoids   . Fibromyalgia with myofascial pain 11/16/2012  . Headaches, cluster   . Hypercholesterolemia 12/15/2013  . Hypertension   . Insomnia 11/16/2012  . Myalgia and myositis, unspecified   . Other disorder of muscle, ligament, and fascia    eosinophilic fascitis  . Other specified disease of hair and hair follicles   . Pain in joint, multiple sites   . Restless leg syndrome 12/15/2013  . Unspecified sleep apnea   . Urine incontinence     Past Surgical History:  Procedure Laterality Date  . ABDOMINOPLASTY  2006   "tummy tuck"  . BREAST REDUCTION SURGERY  1995  . BREAST SURGERY  1981   left breast biopsy  . CATARACT EXTRACTION, BILATERAL    . MUSCLE BIOPSY  2009   DEEP TISSUE  . REDUCTION MAMMAPLASTY    . REFRACTIVE SURGERY    . TONSILLECTOMY    . TOTAL ABDOMINAL HYSTERECTOMY  1999  . WISDOM TOOTH EXTRACTION      Family Psychiatric History:   Family History:  Family History  Problem Relation Age of Onset  . Colon cancer Father 88  .  Heart disease Father   . Colon polyps Mother   . Heart disease Mother   . Heart attack Mother   . COPD Sister   . Emphysema Sister   . COPD Brother   . Emphysema Brother   . Lung cancer Brother   . Breast cancer Paternal Grandmother   . Heart disease Other        family history  . Parkinson's disease Other   . Heart disease Paternal Grandfather   . Colon polyps Paternal Grandfather   . Stomach cancer Neg Hx   . Rectal cancer Neg Hx   . Esophageal cancer Neg Hx     Social History:   Social History   Socioeconomic History  . Marital status: Divorced    Spouse name: Not on file  . Number of children: 2  . Years of education: Not on file  . Highest education  level: Not on file  Occupational History  . Occupation: Retired    Fish farm manager: RETIRED    CommentAstronomer  Tobacco Use  . Smoking status: Former Smoker    Quit date: 12/19/1986    Years since quitting: 33.6  . Smokeless tobacco: Never Used  Vaping Use  . Vaping Use: Never used  Substance and Sexual Activity  . Alcohol use: Yes    Alcohol/week: 1.0 standard drink    Types: 1 Glasses of wine per week    Comment: 2-3 x year  . Drug use: No  . Sexual activity: Yes    Birth control/protection: Surgical, Post-menopausal  Other Topics Concern  . Not on file  Social History Narrative   Lives alone.  Retired.     Education- 2 years of college   2 daughters   caffeine daily 2-3 cups   Social Determinants of Health   Financial Resource Strain:   . Difficulty of Paying Living Expenses: Not on file  Food Insecurity:   . Worried About Charity fundraiser in the Last Year: Not on file  . Ran Out of Food in the Last Year: Not on file  Transportation Needs:   . Lack of Transportation (Medical): Not on file  . Lack of Transportation (Non-Medical): Not on file  Physical Activity:   . Days of Exercise per Week: Not on file  . Minutes of Exercise per Session: Not on file  Stress:   . Feeling of Stress : Not on file  Social Connections:   . Frequency of Communication with Friends and Family: Not on file  . Frequency of Social Gatherings with Friends and Family: Not on file  . Attends Religious Services: Not on file  . Active Member of Clubs or Organizations: Not on file  . Attends Archivist Meetings: Not on file  . Marital Status: Not on file    Additional Social History:   Allergies:   Allergies  Allergen Reactions  . Crestor [Rosuvastatin Calcium]     abd pain  . Cymbalta [Duloxetine Hcl]     Nausea   . Effexor Xr [Venlafaxine Hcl Er]     dizziness  . Morphine And Related Itching    agitation  . Pravastatin     abd pain  . Promethazine-Codeine  Itching    Itching in face  . Rizatriptan Other (See Comments)    Tingling, prickly sensation, feelings of palpitations, flushing.     Metabolic Disorder Labs: Lab Results  Component Value Date   HGBA1C 5.0 02/18/2019   No results found for:  PROLACTIN Lab Results  Component Value Date   CHOL 171 06/20/2020   TRIG 65 06/20/2020   HDL 89 06/20/2020   CHOLHDL 1.9 06/20/2020   VLDL 11.4 03/18/2018   LDLCALC 69 06/20/2020   LDLCALC 84 05/16/2019     Current Medications: Current Outpatient Medications  Medication Sig Dispense Refill  . atorvastatin (LIPITOR) 40 MG tablet Take 1 tablet (40 mg total) by mouth daily. 90 tablet 3  . B Complex-C (SUPER B COMPLEX) TABS Take 1 tablet by mouth daily.     . Carbidopa-Levodopa ER (SINEMET CR) 25-100 MG tablet controlled release Take 1 tablet by mouth 2 (two) times daily. Slow release     . clonazePAM (KLONOPIN) 0.5 MG tablet 1 bid 60 tablet 4  . magnesium 30 MG tablet Take 30 mg by mouth 2 (two) times daily.     . Multiple Vitamins-Minerals (MULTIVITAMIN WITH MINERALS) tablet Take 1 tablet by mouth daily.      Marland Kitchen rOPINIRole (REQUIP) 0.25 MG tablet Take 1-2 tablets (0.25-0.5 mg total) by mouth 2 (two) times daily. 120 tablet 11  . traZODone (DESYREL) 50 MG tablet Take 1 tablet (50 mg total) by mouth at bedtime. 90 tablet 3  . Vitamin D, Ergocalciferol, (DRISDOL) 1.25 MG (50000 UNIT) CAPS capsule Take 1 capsule (50,000 Units total) by mouth every 7 (seven) days for 12 doses. 12 capsule 0   No current facility-administered medications for this visit.    Neurologic: Headache: No Seizure: No Paresthesias:No  Musculoskeletal: Strength & Muscle Tone: abnormal Gait & Station: unsteady Patient leans: N/A  Psychiatric Specialty Exam: ROS  There were no vitals taken for this visit.There is no height or weight on file to calculate BMI.  General Appearance: Fairly Groomed  Eye Contact:  Good  Speech:  Clear and Coherent  Volume:  Normal   Mood:  Depressed  Affect:  Appropriate  Thought Process:  Goal Directed  Orientation:  Full (Time, Place, and Person)  Thought Content:  WDL  Suicidal Thoughts:  No  Homicidal Thoughts:  No  Memory:  NA  Judgement:  Good  Insight:  Fair  Psychomotor Activity:  Normal  Concentration:    Recall:  [place] of Knowledge:Good  Language: Fair  Akathisia:  No  Handed:  Right  AIMS (if indicated):    Assets:  Desire for Improvement  ADL's:  Intact  Cognition: WNL  Sleep:      Treatment Plan Summary:  11/3/20212:07 PM   Today the patient is doing fairly well.  She is at her baseline.  I believe she has an adjustment disorder with an anxious mood state.  She will take Klonopin 0.5 mg 2 a day for this condition.  She also has insomnia.  She takes trazodone at low doses which helps a lot.  I am not clear that she is going to return to the therapist.  She does have another appointment with her neurologist where again the chronic clarify what her target symptoms of her Parkinson's is.  She clearly has restless leg syndrome and she takes medicines for that it helps her.  She does have a lot of physical complaints mainly positional issues like dizziness.  This patient she will return to see me in 4 months.

## 2020-08-16 ENCOUNTER — Other Ambulatory Visit: Payer: Self-pay | Admitting: Internal Medicine

## 2020-08-16 DIAGNOSIS — E559 Vitamin D deficiency, unspecified: Secondary | ICD-10-CM

## 2020-08-16 NOTE — Progress Notes (Signed)
Lab ordered.

## 2020-08-28 DIAGNOSIS — H16223 Keratoconjunctivitis sicca, not specified as Sjogren's, bilateral: Secondary | ICD-10-CM | POA: Diagnosis not present

## 2020-09-07 ENCOUNTER — Other Ambulatory Visit (HOSPITAL_COMMUNITY): Payer: Self-pay | Admitting: Endocrinology

## 2020-09-07 DIAGNOSIS — I951 Orthostatic hypotension: Secondary | ICD-10-CM

## 2020-09-07 DIAGNOSIS — G20 Parkinson's disease: Secondary | ICD-10-CM

## 2020-09-14 ENCOUNTER — Ambulatory Visit (HOSPITAL_COMMUNITY): Admission: RE | Admit: 2020-09-14 | Payer: Medicare HMO | Source: Ambulatory Visit

## 2020-09-14 ENCOUNTER — Encounter (HOSPITAL_COMMUNITY): Payer: Self-pay

## 2020-09-19 ENCOUNTER — Ambulatory Visit: Payer: Medicare HMO | Admitting: Diagnostic Neuroimaging

## 2020-09-19 ENCOUNTER — Encounter: Payer: Self-pay | Admitting: Diagnostic Neuroimaging

## 2020-09-19 ENCOUNTER — Other Ambulatory Visit: Payer: Self-pay

## 2020-09-19 VITALS — BP 120/81 | HR 89 | Ht 61.5 in | Wt 135.0 lb

## 2020-09-19 DIAGNOSIS — G20 Parkinson's disease: Secondary | ICD-10-CM | POA: Diagnosis not present

## 2020-09-19 DIAGNOSIS — G20C Parkinsonism, unspecified: Secondary | ICD-10-CM

## 2020-09-19 MED ORDER — CARBIDOPA-LEVODOPA ER 25-100 MG PO TBCR: 1.0000 | EXTENDED_RELEASE_TABLET | Freq: Four times a day (QID) | ORAL | 4 refills | Status: DC

## 2020-09-19 MED ORDER — ROPINIROLE HCL 0.5 MG PO TABS
0.5000 mg | ORAL_TABLET | Freq: Three times a day (TID) | ORAL | 4 refills | Status: DC
Start: 2020-09-19 — End: 2020-11-01

## 2020-09-19 NOTE — Patient Instructions (Signed)
PARKINSONISM (postural instability / gait difficulty subtype vs other atypical parkinsonism) - continue ropinirole 0.5mg  three times a day  - continue carb/levo25/100 1 tab 4x per day (exam improved since starting carb/levo) - may consider rasagiline / selegiline or amantadine in future - continue physical therapy for gait and balance training - improve nutrition, depression and anxiety treatment - monitor BP at home  ANXIETY / INSOMNIA / ADJUSTMENT DISORDER - per Dr. Casimiro Needle (on clonazepam)

## 2020-09-19 NOTE — Progress Notes (Signed)
Chief Complaint  Patient presents with  . Parkinsonism    rm 7, "saw [place] for 2nd opinion, didn't get MRI done; feelslike I have RLS inside my body but Requip has helped;  issue with bladder, constipation"    History of Present Illness:  UPDATE (09/19/20, VRP): Since last visit, had 2nd opinion with Dr. Linus Mako and PA Nyoka Cowden; now on carb / levo 1 tab 4x per day. Also on ropinirole 0.5mg  three times a day (sometimes 4x per day; helping with internal restlessness). Anxiety, insomnia continue. Dizziness (lightheadedness) intermittent; some low BP reading (90/60) but most of the time ok.   UPDATE (11/01/2018, VRP): She continues to have blurred vision, headaches and tremor of hands. Blurred vision is worse with bright lights. She is having right sided temporal and occipital headaches. They are throbbing with photosensitivity and nausea. She kept BP log and readings were normal. No migraine diary. Resting tremor is worse in the evening. L> R hand. PT in 08/2018 did not help much. She is going to GYM 3 times a week, both cardio and light weights. She continues to [employer] about being forgetful. She is constantly hoarse and has intermittent trouble swallowing saliva. No trouble swallowing food or liquids. No falls. She is taking ropinirole 0.25mg  in the am and 0.50 in the pm. She is tolerating well. She did not increase dose due to dizziness. She feels that symptoms are no worse since last visit in 08/2018 but no better.   UPDATE (08/27/18, VRP): Since last visit, symptoms are progressing.  Now patient having more problems with lightheadedness, dizziness, gait and balance difficulty, tremor, fatigue.  Also with more confusion, difficulty swallowing, swallowing difficulty hoarse voice, difficulty with handwriting.  Also reports increasing photosensitivity and blurred vision.  She does have history of headaches since age 26 years old with right temporal throbbing painful sensation associate with  photophobia and nausea.  PRIOR HPI (12/02/17): 74 year old right-handed female here for evaluation of tremor, weakness, blurred vision, headaches, fatigue.  Evaluate for Parkinson symptoms.  Patient reports intermittent weakness and shaky sensation with heart racing feeling since February 2018.  Patient also having some intermittent tremor, mainly with certain postures and actions, mainly in her left hand, dating back to January 2017.  She had evaluation with movement disorder specialist Dr. Carles Collet in 2017, who felt that patient did not have Parkinson's disease at that time.  Long history of loss of smell.  Patient feels like her handwriting is worsening.  Patient also concerned about her family history of Parkinson's disease in her paternal grandfather and maternal aunt.  Patient also having issues with depression, confusion, memory loss, balance problems.  Patient has intermittent headaches with pressure sensation.  No nausea or vomiting.  No photophobia or phonophobia.  Patient has been struggling with her vision and has gone through "$2000 worth" of different glasses and eye exams without benefit in her vision.      Observations/Objective:  GENERAL EXAM/CONSTITUTIONAL: Vitals:  Vitals:   09/19/20 1258  BP: 120/81  Pulse: 89  Weight: 135 lb (61.2 kg)  Height: 5' 1.5" (1.562 m)   Body mass index is 25.09 kg/m. Wt Readings from Last 3 Encounters:  09/19/20 135 lb (61.2 kg)  08/14/20 136 lb 9.6 oz (62 kg)  06/22/20 132 lb 12.8 oz (60.2 kg)   No data found.   Patient is in no distress; well developed, nourished and groomed; neck is supple  CARDIOVASCULAR:  Examination of carotid arteries is normal; no carotid bruits  Regular rate and rhythm, no murmurs  Examination of peripheral vascular system by observation and palpation is normal  EYES:  Ophthalmoscopic exam of optic discs and posterior segments is normal; no papilledema or hemorrhages No exam data  present  MUSCULOSKELETAL:  Gait, strength, tone, movements noted in Neurologic exam below  NEUROLOGIC: MENTAL STATUS:  No flowsheet data found.  awake, alert, oriented to person, place and time  recent and remote memory intact  normal attention and concentration  language fluent, comprehension intact, naming intact  fund of knowledge appropriate  CRANIAL NERVE:   2nd - no papilledema on fundoscopic exam  2nd, 3rd, 4th, 6th - pupils equal and reactive to light, visual fields full to confrontation, extraocular muscles intact, no nystagmus  5th - facial sensation symmetric  7th - facial strength symmetric  8th - hearing intact  9th - palate elevates symmetrically, uvula midline  11th - shoulder shrug symmetric  12th - tongue protrusion midline  MASKED FACIES  MOTOR:   normal bulk and tone, full strength in the BUE, BLE; NO BRADYKINESIA; NO TREMOR; MINIMAL INCREASED TONE IN LUE  SENSORY:   normal and symmetric to light touch, temperature, vibration  COORDINATION:   finger-nose-finger, fine finger movements normal  REFLEXES:   deep tendon reflexes present and symmetric  GAIT/STATION:   narrow based gait; SMOOTH STRIDE AND TURNING   04/08/17 MRI brain - The pituitary appears mildly atrophic, but there are no features suggestive of microadenoma, macroadenoma, or parasellar mass. - Moderate atrophy and small vessel disease. No hypothalamic abnormality. - No acute or focal intracranial findings. Normal postcontrast appearance of the brain.  12/30/18 DATscan - Subtle truncation of the posterior RIGHT striata compared to the LEFT. While this could be an anatomic variation, the basal ganglia are symmetric RIGHT to LEFT on comparison MRI. Findings could indicate Parkinson's syndrome pathology.   Assessment and Plan:  74 y.o.   Dx:  1. Parkinsonism, unspecified Parkinsonism type ([place])      PARKINSONISM (postural instability / gait difficulty subtype vs  other atypical parkinsonism) - continue ropinirole 0.5mg  three times a day  - continue carb/levo25/100 1 tab 4x per day (exam improved since starting carb/levo) - may consider rasagiline / selegiline or amantadine in future - continue physical therapy for gait and balance training - improve nutrition, depression and anxiety treatment - monitor BP at home  ANXIETY / INSOMNIA / ADJUSTMENT DISORDER - per Dr. 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 Carbidopa-Levodopa ER (SINEMET CR) 25-100 MG tablet controlled release    Sig: Take 1 tablet by mouth in the morning, at noon, in the evening, and at bedtime. Slow release    Dispense:  360 tablet    Refill:  4  . rOPINIRole (REQUIP) 0.5 MG tablet    Sig: Take 1 tablet (0.5 mg total) by mouth 3 (three) times daily.    Dispense:  270 tablet    Refill:  4    Follow Up Instructions:  - Return in about 1 year (around 09/19/2021).    I spent 60 minutes of face-to-face and non-face-to-face time with patient.  This included previsit chart review, lab review, study review, order entry, electronic health record documentation, patient education.      Penni Bombard, MD 09/19/2020, 1:30  PM Certified in Neurology, Neurophysiology and [facility] [address] [place], [place] [address] [phone]

## 2020-09-26 ENCOUNTER — Other Ambulatory Visit (HOSPITAL_COMMUNITY): Payer: Self-pay | Admitting: Psychiatry

## 2020-10-13 DIAGNOSIS — Z9289 Personal history of other medical treatment: Secondary | ICD-10-CM

## 2020-10-13 DIAGNOSIS — H814 Vertigo of central origin: Secondary | ICD-10-CM

## 2020-10-13 HISTORY — DX: Personal history of other medical treatment: Z92.89

## 2020-10-15 NOTE — Telephone Encounter (Signed)
I will order MRI brain.  Orders Placed This Encounter  Procedures  . MR BRAIN/IAC W WO CONTRAST   Suanne Marker, MD 10/15/2020, 11:02 AM Certified in Neurology, Neurophysiology and Neuroimaging  [facility] [address] [place], [place] [address] [phone]

## 2020-10-17 ENCOUNTER — Telehealth: Payer: Self-pay | Admitting: Diagnostic Neuroimaging

## 2020-10-17 NOTE — Telephone Encounter (Signed)
[employer] medicare Berkley Harvey: [id number] (exp. 10/17/20 to 04/15/21) order sent to GI. They will reach out to the patient to schedule.

## 2020-10-28 ENCOUNTER — Inpatient Hospital Stay: Admission: RE | Admit: 2020-10-28 | Payer: Medicare HMO | Source: Ambulatory Visit

## 2020-10-31 ENCOUNTER — Ambulatory Visit: Payer: Medicare HMO | Admitting: Internal Medicine

## 2020-10-31 ENCOUNTER — Other Ambulatory Visit: Payer: Self-pay

## 2020-10-31 ENCOUNTER — Ambulatory Visit
Admission: RE | Admit: 2020-10-31 | Discharge: 2020-10-31 | Disposition: A | Discharge status: Home / Self Care | Payer: Medicare HMO | Source: Ambulatory Visit | Attending: Diagnostic Neuroimaging | Admitting: Diagnostic Neuroimaging

## 2020-10-31 DIAGNOSIS — H814 Vertigo of central origin: Secondary | ICD-10-CM

## 2020-10-31 MED ORDER — GADOBENATE DIMEGLUMINE 529 MG/ML IV SOLN
10.0000 mL | Freq: Once | INTRAVENOUS | Status: AC | PRN
  Administered 2020-10-31: 10 mL via INTRAVENOUS

## 2020-11-01 ENCOUNTER — Encounter: Payer: Self-pay | Admitting: Internal Medicine

## 2020-11-01 ENCOUNTER — Other Ambulatory Visit: Payer: Self-pay

## 2020-11-01 ENCOUNTER — Ambulatory Visit (INDEPENDENT_AMBULATORY_CARE_PROVIDER_SITE_OTHER): Payer: Medicare HMO | Admitting: Internal Medicine

## 2020-11-01 VITALS — BP 120/70 | HR 68 | Temp 97.9°F | Wt 135.5 lb

## 2020-11-01 DIAGNOSIS — E559 Vitamin D deficiency, unspecified: Secondary | ICD-10-CM | POA: Diagnosis not present

## 2020-11-01 DIAGNOSIS — M79605 Pain in left leg: Secondary | ICD-10-CM | POA: Diagnosis not present

## 2020-11-01 DIAGNOSIS — G2581 Restless legs syndrome: Secondary | ICD-10-CM | POA: Diagnosis not present

## 2020-11-01 LAB — CBC WITH DIFFERENTIAL/PLATELET
Basophils Absolute: 0.1 10*3/uL (ref 0.0–0.1)
Basophils Relative: 1.1 % (ref 0.0–3.0)
Eosinophils Absolute: 0.2 10*3/uL (ref 0.0–0.7)
Eosinophils Relative: 3.2 % (ref 0.0–5.0)
HCT: 38.1 % (ref 36.0–46.0)
Hemoglobin: 13.2 g/dL (ref 12.0–15.0)
Lymphocytes Relative: 39.4 % (ref 12.0–46.0)
Lymphs Abs: 2.1 10*3/uL (ref 0.7–4.0)
MCHC: 34.5 g/dL (ref 30.0–36.0)
MCV: 91.8 fl (ref 78.0–100.0)
Monocytes Absolute: 0.5 10*3/uL (ref 0.1–1.0)
Monocytes Relative: 9 % (ref 3.0–12.0)
Neutro Abs: 2.5 10*3/uL (ref 1.4–7.7)
Neutrophils Relative %: 47.3 % (ref 43.0–77.0)
Platelets: 227 10*3/uL (ref 150.0–400.0)
RBC: 4.15 Mil/uL (ref 3.87–5.11)
RDW: 13.1 % (ref 11.5–15.5)
WBC: 5.2 10*3/uL (ref 4.0–10.5)

## 2020-11-01 LAB — IBC PANEL
Iron: 72 ug/dL (ref 42–145)
Saturation Ratios: 21.1 % (ref 20.0–50.0)
Transferrin: 244 mg/dL (ref 212.0–360.0)

## 2020-11-01 LAB — VITAMIN D 25 HYDROXY (VIT D DEFICIENCY, FRACTURES): VITD: 66.54 ng/mL (ref 30.00–100.00)

## 2020-11-01 LAB — FERRITIN: Ferritin: 42.4 ng/mL (ref 10.0–291.0)

## 2020-11-01 LAB — IRON: Iron: 72 ug/dL (ref 42–145)

## 2020-11-01 NOTE — Addendum Note (Signed)
Addended by: Marrion Coy on: 11/01/2020 11:09 AM   Modules accepted: Orders

## 2020-11-01 NOTE — Progress Notes (Signed)
Established Patient Office Visit     This visit occurred during the SARS-CoV-2 public health emergency.  Safety protocols were in place, including screening questions prior to the visit, additional usage of staff PPE, and extensive cleaning of exam room while observing appropriate contact time as indicated for disinfecting solutions.    CC/Reason for Visit: Left leg pain  HPI: Shannon Sutton is a 75 y.o. female who is coming in today for the above mentioned reasons.  She has a history of Parkinson's disease and restless leg syndrome.  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 Deboraha Sprang.  She has been having left leg pain from the knee down that feels like electric shocks and a feeling of ants crawling over her skin that only occurs at nighttime.  When she contacted her neurologist apparently she was instructed to schedule follow-up with me.  She has no other complaints.  She also has a history of vitamin D deficiency and is due to have her levels rechecked today after completing 3 months of high-dose weekly supplementation.   Past Medical/Surgical History: Past Medical History:  Diagnosis Date  . Adenomatous colon polyp 1990  . Allergy   . Anxiety 12/15/2013  . Arthritis   . Cataract   . Chronic constipation   . Depression 11/16/2012  . Diverticulitis   . Diverticulosis   . 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 External hemorrhoids   . Fibromyalgia with myofascial pain 11/16/2012  . Headaches, cluster   . Hypercholesterolemia 12/15/2013  . Hypertension   . Insomnia 11/16/2012  . Myalgia and myositis, unspecified   . Other disorder of muscle, ligament, and fascia    eosinophilic fascitis  . Other specified disease of hair and hair follicles   . Pain in joint, multiple sites   . Restless leg syndrome 12/15/2013  . Unspecified sleep apnea   . Urine incontinence     Past  Surgical History:  Procedure Laterality Date  . ABDOMINOPLASTY  2006   "tummy tuck"  . BREAST REDUCTION SURGERY  1995  . BREAST SURGERY  1981   left breast biopsy  . CATARACT EXTRACTION, BILATERAL    . MUSCLE BIOPSY  2009   DEEP TISSUE  . REDUCTION MAMMAPLASTY    . REFRACTIVE SURGERY    . TONSILLECTOMY    . TOTAL ABDOMINAL HYSTERECTOMY  1999  . WISDOM TOOTH EXTRACTION      Social History:  reports that she quit smoking about 33 years ago. She has never used smokeless tobacco. She reports current alcohol use of about 1.0 standard drink of alcohol per week. She reports that she does not use drugs.  Allergies: Allergies  Allergen Reactions  . Crestor [Rosuvastatin Calcium]     abd pain  . Cymbalta [Duloxetine Hcl]     Nausea   . Effexor Xr [Venlafaxine Hcl Er]     dizziness  . Morphine And Related Itching    agitation  . Pravastatin     abd pain  . Promethazine-Codeine Itching    Itching in face  . Rizatriptan Other (See Comments)    Tingling, prickly sensation, feelings of palpitations, flushing.     Family History:  Family History  Problem Relation Age of Onset  . Colon cancer Father 38  . Heart disease Father   . Colon polyps Mother   . Heart disease Mother   .  Heart attack Mother   . COPD Sister   . Emphysema Sister   . COPD Brother   . Emphysema Brother   . Lung cancer Brother   . Breast cancer Paternal Grandmother   . Heart disease Other        family history  . Parkinson's disease Other   . Heart disease Paternal Grandfather   . Colon polyps Paternal Grandfather   . Stomach cancer Neg Hx   . Rectal cancer Neg Hx   . Esophageal cancer Neg Hx      Current Outpatient Medications:  .  atorvastatin (LIPITOR) 40 MG tablet, Take 1 tablet (40 mg total) by mouth daily., Disp: 90 tablet, Rfl: 3 .  B Complex-C (SUPER B COMPLEX) TABS, Take 1 tablet by mouth daily. , Disp: , Rfl:  .  Carbidopa-Levodopa ER (SINEMET CR) 25-100 MG tablet controlled release, Take  1 tablet by mouth in the morning, at noon, in the evening, and at bedtime. Slow release (Patient taking differently: Take by mouth. Slow release.  Take one and half tab three times daily), Disp: 360 tablet, Rfl: 4 .  clonazePAM (KLONOPIN) 0.5 MG tablet, 1 bid, Disp: 60 tablet, Rfl: 4 .  magnesium 30 MG tablet, Take 30 mg by mouth 2 (two) times daily. , Disp: , Rfl:  .  Multiple Vitamins-Minerals (MULTIVITAMIN WITH MINERALS) tablet, Take 1 tablet by mouth daily.  , Disp: , Rfl:  .  traZODone (DESYREL) 50 MG tablet, Take 1 tablet (50 mg total) by mouth at bedtime. (Patient taking differently: Take 50 mg by mouth. Take 2 tabs at bedtime), Disp: 90 tablet, Rfl: 3 .  Vitamin D, Ergocalciferol, (DRISDOL) 1.25 MG (50000 UNIT) CAPS capsule, Take 1 capsule (50,000 Units total) by mouth every 7 (seven) days for 12 doses., Disp: 12 capsule, Rfl: 0 .  rOPINIRole (REQUIP) 1 MG tablet, Take 1 mg by mouth 3 (three) times daily., Disp: , Rfl:   Review of Systems:  Constitutional: Denies fever, chills, diaphoresis, appetite change and fatigue.  HEENT: Denies photophobia, eye pain, redness, hearing loss, ear pain, congestion, sore throat, rhinorrhea, sneezing, mouth sores, trouble swallowing, neck pain, neck stiffness and tinnitus.   Respiratory: Denies SOB, DOE, cough, chest tightness,  and wheezing.   Cardiovascular: Denies chest pain, palpitations and leg swelling.  Gastrointestinal: Denies nausea, vomiting, abdominal pain, diarrhea, constipation, blood in stool and abdominal distention.  Genitourinary: Denies dysuria, urgency, frequency, hematuria, flank pain and difficulty urinating.  Endocrine: Denies: hot or cold intolerance, sweats, changes in hair or nails, polyuria, polydipsia. Musculoskeletal: Denies myalgias, back pain, joint swelling, arthralgias and gait problem.  Skin: Denies pallor, rash and wound.  Neurological: Denies dizziness, seizures, syncope, weakness, light-headedness and headaches.   Hematological: Denies adenopathy. Easy bruising, personal or family bleeding history  Psychiatric/Behavioral: Denies suicidal ideation, mood changes, confusion, nervousness, sleep disturbance and agitation    Physical Exam: Vitals:   11/01/20 1019  BP: 120/70  Pulse: 68  Temp: 97.9 F (36.6 C)  TempSrc: Oral  SpO2: 97%  Weight: 135 lb 8 oz (61.5 kg)    Body mass index is 25.19 kg/m.   Constitutional: NAD, calm, comfortable Eyes: PERRL, lids and conjunctivae normal, wears corrective lenses ENMT: Mucous membranes are moist.  Respiratory: clear to auscultation bilaterally, no wheezing, no crackles. Normal respiratory effort. No accessory muscle use.  Cardiovascular: Regular rate and rhythm, no murmurs / rubs / gallops. No extremity edema.   Musculoskeletal: Good TP/DP pulses in the left leg, no edema, no  calf tightness, negative Homans' sign. Neurologic: Grossly intact and nonfocal. Psychiatric: Normal judgment and insight. Alert and oriented x 3. Normal mood.    Impression and Plan:  Left leg pain Restless leg syndrome -Doubt DVT/arterial insufficiency given lack of swelling and positive pulses. -Not sure what else I can offer this patient that has a clear history of restless leg syndrome and Parkinson's disease. -I will go ahead and check a hemoglobin and iron levels today as these have been associated with restless legs. -She has been instructed to follow-up with neurology as needed.  Vitamin D deficiency  - Plan: VITAMIN D 25 Hydroxy (Vit-D Deficiency, Fractures)   Patient Instructions  -Nice seeing you today!!  -Lab work today; will notify you once results are available.  -Schedule follow up as needed.     Lelon Frohlich, MD [facility]

## 2020-11-01 NOTE — Addendum Note (Signed)
Addended by: Marrion Coy on: 11/01/2020 11:10 AM   Modules accepted: Orders

## 2020-11-01 NOTE — Patient Instructions (Signed)
-  Nice seeing you today!!  -Lab work today; will notify you once results are available.  -Schedule follow up as needed.

## 2020-11-02 ENCOUNTER — Encounter: Payer: Self-pay | Admitting: Internal Medicine

## 2020-11-05 ENCOUNTER — Telehealth: Payer: Self-pay | Admitting: *Deleted

## 2020-11-06 NOTE — Telephone Encounter (Signed)
MRI looks ok. May take disc to [facility] for their review (we ordered for them). -VRP

## 2020-11-06 NOTE — Telephone Encounter (Signed)
Sent my chart with MD note.

## 2020-11-08 ENCOUNTER — Other Ambulatory Visit: Payer: Self-pay | Admitting: Internal Medicine

## 2020-11-08 DIAGNOSIS — Z1231 Encounter for screening mammogram for malignant neoplasm of breast: Secondary | ICD-10-CM

## 2020-11-13 DIAGNOSIS — G2581 Restless legs syndrome: Secondary | ICD-10-CM | POA: Diagnosis not present

## 2020-11-13 DIAGNOSIS — Z79899 Other long term (current) drug therapy: Secondary | ICD-10-CM | POA: Diagnosis not present

## 2020-11-13 DIAGNOSIS — G20 Parkinson's disease: Secondary | ICD-10-CM | POA: Diagnosis not present

## 2020-11-14 ENCOUNTER — Other Ambulatory Visit: Payer: Self-pay | Admitting: Internal Medicine

## 2020-11-14 DIAGNOSIS — Z1231 Encounter for screening mammogram for malignant neoplasm of breast: Secondary | ICD-10-CM

## 2020-12-12 ENCOUNTER — Telehealth (INDEPENDENT_AMBULATORY_CARE_PROVIDER_SITE_OTHER): Payer: Medicare HMO | Admitting: Psychiatry

## 2020-12-12 ENCOUNTER — Other Ambulatory Visit: Payer: Self-pay

## 2020-12-12 DIAGNOSIS — F324 Major depressive disorder, single episode, in partial remission: Secondary | ICD-10-CM | POA: Diagnosis not present

## 2020-12-12 DIAGNOSIS — F4323 Adjustment disorder with mixed anxiety and depressed mood: Secondary | ICD-10-CM | POA: Diagnosis not present

## 2020-12-12 DIAGNOSIS — R69 Illness, unspecified: Secondary | ICD-10-CM | POA: Diagnosis not present

## 2020-12-12 MED ORDER — TRAZODONE HCL 50 MG PO TABS: ORAL_TABLET | ORAL | 4 refills | Status: DC

## 2020-12-12 MED ORDER — CLONAZEPAM 0.5 MG PO TABS: ORAL_TABLET | ORAL | 4 refills | Status: DC

## 2020-12-12 NOTE — Progress Notes (Signed)
Psychiatric Initial Adult Assessment   Patient Identification: Shannon Sutton MRN:  [id number] Date of Evaluation:  12/12/2020 Referral Source Dr. Alain Marion Chief Complaint:  Depression Visit Diagnosis major depression single episode in remission  History of Present Illness  Today the patient is not doing very well.  She feels a lot of anxiety.  She denies really feeling depressed much at all.  She mainly feels nervous and it is directly related to Parkinson's.  She says she has a lot of internal anxiety and agitation.  Presently she is seeing Dr Alan Ripper a neurologist in [place].  They are apparently changing around lots of her Parkinson's medicines.  Apparently she is having a lot of problems doing this.  I suspect it has some role in why the patient is so anxious.  She is very afflicted by her condition of having Parkinson's.  Patient is for reasons she cannot explain discontinue the Prozac that we agreed for her to take last time.  I do not think it is all that relevant.  The patient increased her trazodone and it does help her sleep.  She takes Klonopin 0.5 mg twice daily.  The patient is going to get some massage for her neck.  Patient is on multiple neurological medicines including ropinirole, Sinemet and propranolol.  I have asked the patient to be sure the neurologist know she is seeing me.  Generally the patient is eating fairly well.  She is got good energy.  She has no evidence of psychosis.  She drinks no alcohol and uses no drugs.  Depression Symptoms:  depressed mood, (Hypo) Manic Symptoms:   Anxiety Symptoms:   Psychotic Symptoms:   PTSD Symptoms:   Past Psychiatric History: Prozac, Effexor,Vybrid  Previous Psychotropic Medications: Yes   Substance Abuse History in the last 12 months:  No.  Consequences of Substance Abuse:   Past Medical History:  Past Medical History:  Diagnosis Date  . Adenomatous colon polyp 1990  . Allergy   . Anxiety 12/15/2013  .  Arthritis   . Cataract   . Chronic constipation   . Depression 11/16/2012  . Diverticulitis   . Diverticulosis   . Eosinophilic fasciitis 4/0/9735   Recurrent  Dr Trudie Reed 2009 had a bx/MRI: was on MTX and Prednisone  . External hemorrhoids   . Fibromyalgia with myofascial pain 11/16/2012  . Headaches, cluster   . Hypercholesterolemia 12/15/2013  . Hypertension   . Insomnia 11/16/2012  . Myalgia and myositis, unspecified   . Other disorder of muscle, ligament, and fascia    eosinophilic fascitis  . Other specified disease of hair and hair follicles   . Pain in joint, multiple sites   . Restless leg syndrome 12/15/2013  . Unspecified sleep apnea   . Urine incontinence     Past Surgical History:  Procedure Laterality Date  . ABDOMINOPLASTY  2006   "tummy tuck"  . BREAST REDUCTION SURGERY  1995  . BREAST SURGERY  1981   left breast biopsy  . CATARACT EXTRACTION, BILATERAL    . MUSCLE BIOPSY  2009   DEEP TISSUE  . REDUCTION MAMMAPLASTY    . REFRACTIVE SURGERY    . TONSILLECTOMY    . TOTAL ABDOMINAL HYSTERECTOMY  1999  . WISDOM TOOTH EXTRACTION      Family Psychiatric History:   Family History:  Family History  Problem Relation Age of Onset  . Colon cancer Father 76  . Heart disease Father   . Colon polyps Mother   .  Heart disease Mother   . Heart attack Mother   . COPD Sister   . Emphysema Sister   . COPD Brother   . Emphysema Brother   . Lung cancer Brother   . Breast cancer Paternal Grandmother   . Heart disease Other        family history  . Parkinson's disease Other   . Heart disease Paternal Grandfather   . Colon polyps Paternal Grandfather   . Stomach cancer Neg Hx   . Rectal cancer Neg Hx   . Esophageal cancer Neg Hx     Social History:   Social History   Socioeconomic History  . Marital status: Divorced    Spouse name: Not on file  . Number of children: 2  . Years of education: Not on file  . Highest education level: Not on file  Occupational History   . Occupation: Retired    Fish farm manager: RETIRED    CommentAstronomer  Tobacco Use  . Smoking status: Former Smoker    Quit date: 12/19/1986    Years since quitting: 34.0  . Smokeless tobacco: Never Used  Vaping Use  . Vaping Use: Never used  Substance and Sexual Activity  . Alcohol use: Yes    Alcohol/week: 1.0 standard drink    Types: 1 Glasses of wine per week    Comment: 2-3 x year  . Drug use: No  . Sexual activity: Yes    Birth control/protection: Surgical, Post-menopausal  Other Topics Concern  . Not on file  Social History Narrative   Lives alone.  Retired.     Education- 2 years of college   2 daughters   caffeine daily 2-3 cups   Social Determinants of Health   Financial Resource Strain: Not on file  Food Insecurity: Not on file  Transportation Needs: Not on file  Physical Activity: Not on file  Stress: Not on file  Social Connections: Not on file    Additional Social History:   Allergies:   Allergies  Allergen Reactions  . Crestor [Rosuvastatin Calcium]     abd pain  . Cymbalta [Duloxetine Hcl]     Nausea   . Effexor Xr [Venlafaxine Hcl Er]     dizziness  . Morphine And Related Itching    agitation  . Pravastatin     abd pain  . Promethazine-Codeine Itching    Itching in face  . Rizatriptan Other (See Comments)    Tingling, prickly sensation, feelings of palpitations, flushing.     Metabolic Disorder Labs: Lab Results  Component Value Date   HGBA1C 5.0 02/18/2019   No results found for: PROLACTIN Lab Results  Component Value Date   CHOL 171 06/20/2020   TRIG 65 06/20/2020   HDL 89 06/20/2020   CHOLHDL 1.9 06/20/2020   VLDL 11.4 03/18/2018   LDLCALC 69 06/20/2020   LDLCALC 84 05/16/2019     Current Medications: Current Outpatient Medications  Medication Sig Dispense Refill  . atorvastatin (LIPITOR) 40 MG tablet Take 1 tablet (40 mg total) by mouth daily. 90 tablet 3  . B Complex-C (SUPER B COMPLEX) TABS Take 1 tablet by  mouth daily.     . Carbidopa-Levodopa ER (SINEMET CR) 25-100 MG tablet controlled release Take 1 tablet by mouth in the morning, at noon, in the evening, and at bedtime. Slow release (Patient taking differently: Take by mouth. Slow release.  Take one and half tab three times daily) 360 tablet 4  . clonazePAM (KLONOPIN) 0.5  MG tablet 1 qam  2 at diner 90 tablet 4  . magnesium 30 MG tablet Take 30 mg by mouth 2 (two) times daily.     . Multiple Vitamins-Minerals (MULTIVITAMIN WITH MINERALS) tablet Take 1 tablet by mouth daily.      Marland Kitchen rOPINIRole (REQUIP) 1 MG tablet Take 1 mg by mouth 3 (three) times daily.    . traZODone (DESYREL) 50 MG tablet 2  qhs 60 tablet 4   No current facility-administered medications for this visit.    Neurologic: Headache: No Seizure: No Paresthesias:No  Musculoskeletal: Strength & Muscle Tone: abnormal Gait & Station: unsteady Patient leans: N/A  Psychiatric Specialty Exam: ROS  There were no vitals taken for this visit.There is no height or weight on file to calculate BMI.  General Appearance: Fairly Groomed  Eye Contact:  Good  Speech:  Clear and Coherent  Volume:  Normal  Mood:  Depressed  Affect:  Appropriate  Thought Process:  Goal Directed  Orientation:  Full (Time, Place, and Person)  Thought Content:  WDL  Suicidal Thoughts:  No  Homicidal Thoughts:  No  Memory:  NA  Judgement:  Good  Insight:  Fair  Psychomotor Activity:  Normal  Concentration:    Recall:  [place] of Knowledge:Good  Language: Fair  Akathisia:  No  Handed:  Right  AIMS (if indicated):    Assets:  Desire for Improvement  ADL's:  Intact  Cognition: WNL  Sleep:      Treatment Plan Summary:  3/2/20221:57 PM   At this time the patient is diagnosed with major depression in remission.  I am actually sure that it is actually a mood disorder secondary to a medical condition Parkinson's.  Nonetheless officially will be major depression and yet will discontinue any  Prozac.  Her second problem is adjustment disorder with an anxious mood state.  At this time we will increase her Klonopin to take 1.5 mg 1 in the morning and 2 at dinner.  Her third problem is insomnia and the patient will increase her trazodone to taking 50 mg 2 at night.  Patient will once again look into a Parkinson's support group and will get some massage for her neck.  She did see the therapist 1 time but there is no real traction or benefit.  Therefore she is not seeing a therapist.  She will return to see me in 2 to 3 months.

## 2020-12-13 DIAGNOSIS — H10413 Chronic giant papillary conjunctivitis, bilateral: Secondary | ICD-10-CM | POA: Diagnosis not present

## 2020-12-13 DIAGNOSIS — H35371 Puckering of macula, right eye: Secondary | ICD-10-CM | POA: Diagnosis not present

## 2020-12-13 DIAGNOSIS — Z961 Presence of intraocular lens: Secondary | ICD-10-CM | POA: Diagnosis not present

## 2020-12-13 DIAGNOSIS — H0102A Squamous blepharitis right eye, upper and lower eyelids: Secondary | ICD-10-CM | POA: Diagnosis not present

## 2020-12-13 DIAGNOSIS — H0102B Squamous blepharitis left eye, upper and lower eyelids: Secondary | ICD-10-CM | POA: Diagnosis not present

## 2020-12-13 DIAGNOSIS — H16223 Keratoconjunctivitis sicca, not specified as Sjogren's, bilateral: Secondary | ICD-10-CM | POA: Diagnosis not present

## 2020-12-21 ENCOUNTER — Ambulatory Visit: Payer: Medicare HMO

## 2020-12-27 ENCOUNTER — Ambulatory Visit: Payer: Medicare HMO

## 2020-12-29 DIAGNOSIS — M25561 Pain in right knee: Secondary | ICD-10-CM | POA: Diagnosis not present

## 2021-01-18 ENCOUNTER — Telehealth (HOSPITAL_COMMUNITY): Payer: Self-pay

## 2021-01-18 DIAGNOSIS — I6782 Cerebral ischemia: Secondary | ICD-10-CM | POA: Diagnosis not present

## 2021-01-18 DIAGNOSIS — G903 Multi-system degeneration of the autonomic nervous system: Secondary | ICD-10-CM | POA: Diagnosis not present

## 2021-01-18 DIAGNOSIS — R42 Dizziness and giddiness: Secondary | ICD-10-CM | POA: Diagnosis not present

## 2021-01-18 NOTE — Telephone Encounter (Signed)
She os Dr Jeb Levering patient. Please contact him. Thanks

## 2021-01-18 NOTE — Telephone Encounter (Signed)
This is Dr. Audria Nine patient. She called and stated that she's "about to crawl out of her skin & she's going nuts ." She stated that her Clonazepam 0.5mg , 1 qam & 2 at dinner, doesn't help calm her down. She stated that she something more than just Clonazepam or something better for Anxiety. She stated that she feels like she can't breathe & can't get enough oxygen. Please review and advise. Thank you

## 2021-01-21 ENCOUNTER — Telehealth (HOSPITAL_COMMUNITY): Payer: Self-pay

## 2021-01-21 NOTE — Telephone Encounter (Signed)
My apologies Dr. Adele Schilder. I misread it. I will certainly notify Dr. Casimiro Needle. Thank you

## 2021-01-21 NOTE — Telephone Encounter (Signed)
Patient called and stated that she's "about to crawl out of her skin & she's going nuts ." She stated that her Clonazepam 0.5mg , 1 qam & 2 at dinner, doesn't help calm her down. She stated that she something more than just Clonazepam or something better for Anxiety. She stated that she feels like she can't breathe & can't get enough oxygen. Please review and advise. Thank you

## 2021-01-23 ENCOUNTER — Other Ambulatory Visit (HOSPITAL_COMMUNITY): Payer: Self-pay | Admitting: Psychiatry

## 2021-01-23 MED ORDER — HYDROXYZINE PAMOATE 25 MG PO CAPS: ORAL_CAPSULE | ORAL | 4 refills | Status: DC

## 2021-01-25 ENCOUNTER — Telehealth (HOSPITAL_COMMUNITY): Payer: Self-pay | Admitting: *Deleted

## 2021-01-25 NOTE — Telephone Encounter (Signed)
PA for Vistaril 25 mg capsules submitted via CoverMyMeds. Awaiting determination.

## 2021-01-25 NOTE — Telephone Encounter (Signed)
PA for Hydroxyzine Pamoate 25 mg caps approved by [employer] from 10/13/20 thru 10/12/21.

## 2021-01-30 MED ORDER — CLONAZEPAM 0.5 MG PO TABS: ORAL_TABLET | ORAL | 4 refills | Status: DC

## 2021-01-30 NOTE — Telephone Encounter (Signed)
Spoke with dr and he increased morning dosage of patient's Clonazepam 0.5mg . Notified patient and she was very happy with that change. I also advised her to stick with the Hydroxyzine 25mg  per provider. New script for clonazepam 0.5mg  called in to [place]; 2 qam & 2 at dinner #120 w/4 refills.

## 2021-01-30 NOTE — Addendum Note (Signed)
Addended by: Gillian Shields E on: 01/30/2021 03:50 PM   Modules accepted: Orders

## 2021-02-04 DIAGNOSIS — H35371 Puckering of macula, right eye: Secondary | ICD-10-CM | POA: Diagnosis not present

## 2021-02-04 DIAGNOSIS — H16223 Keratoconjunctivitis sicca, not specified as Sjogren's, bilateral: Secondary | ICD-10-CM | POA: Diagnosis not present

## 2021-02-04 DIAGNOSIS — H0102A Squamous blepharitis right eye, upper and lower eyelids: Secondary | ICD-10-CM | POA: Diagnosis not present

## 2021-02-04 DIAGNOSIS — H0102B Squamous blepharitis left eye, upper and lower eyelids: Secondary | ICD-10-CM | POA: Diagnosis not present

## 2021-02-04 DIAGNOSIS — Z961 Presence of intraocular lens: Secondary | ICD-10-CM | POA: Diagnosis not present

## 2021-02-04 NOTE — Telephone Encounter (Signed)
[employer] PRESCRIPTION COVERAGE APPROVED  CLONAZEPAM 0.5MG  TABLET (DOSAGE INCREASE) CASE ID # [id number] EFFECTIVE 10/13/2020 TO 12/31 2022 S/W MARI  NOTIFIED PHARMACY (WENT THROUGH W/$5 [facility]) & PT

## 2021-02-15 ENCOUNTER — Ambulatory Visit
Admission: RE | Admit: 2021-02-15 | Discharge: 2021-02-15 | Disposition: A | Discharge status: Home / Self Care | Payer: Medicare HMO | Source: Ambulatory Visit | Attending: Internal Medicine | Admitting: Internal Medicine

## 2021-02-15 ENCOUNTER — Other Ambulatory Visit: Payer: Self-pay

## 2021-02-15 DIAGNOSIS — Z1231 Encounter for screening mammogram for malignant neoplasm of breast: Secondary | ICD-10-CM

## 2021-02-26 DIAGNOSIS — Z79899 Other long term (current) drug therapy: Secondary | ICD-10-CM | POA: Diagnosis not present

## 2021-02-26 DIAGNOSIS — G20 Parkinson's disease: Secondary | ICD-10-CM | POA: Diagnosis not present

## 2021-02-26 DIAGNOSIS — R69 Illness, unspecified: Secondary | ICD-10-CM | POA: Diagnosis not present

## 2021-02-26 DIAGNOSIS — Z885 Allergy status to narcotic agent status: Secondary | ICD-10-CM | POA: Diagnosis not present

## 2021-02-26 DIAGNOSIS — R0602 Shortness of breath: Secondary | ICD-10-CM | POA: Diagnosis not present

## 2021-02-26 DIAGNOSIS — R42 Dizziness and giddiness: Secondary | ICD-10-CM | POA: Diagnosis not present

## 2021-02-26 DIAGNOSIS — Z888 Allergy status to other drugs, medicaments and biological substances status: Secondary | ICD-10-CM | POA: Diagnosis not present

## 2021-02-26 DIAGNOSIS — R110 Nausea: Secondary | ICD-10-CM | POA: Diagnosis not present

## 2021-03-05 ENCOUNTER — Other Ambulatory Visit: Payer: Self-pay

## 2021-03-06 ENCOUNTER — Encounter: Payer: Self-pay | Admitting: Internal Medicine

## 2021-03-06 ENCOUNTER — Ambulatory Visit (INDEPENDENT_AMBULATORY_CARE_PROVIDER_SITE_OTHER): Payer: Medicare HMO | Admitting: Internal Medicine

## 2021-03-06 VITALS — BP 114/68 | HR 67 | Temp 97.8°F | Wt 130.9 lb

## 2021-03-06 DIAGNOSIS — M549 Dorsalgia, unspecified: Secondary | ICD-10-CM

## 2021-03-06 DIAGNOSIS — G20 Parkinson's disease: Secondary | ICD-10-CM | POA: Diagnosis not present

## 2021-03-06 DIAGNOSIS — M542 Cervicalgia: Secondary | ICD-10-CM

## 2021-03-06 MED ORDER — CYCLOBENZAPRINE HCL 5 MG PO TABS: 5.0000 mg | ORAL_TABLET | Freq: Every evening | ORAL | 1 refills | Status: DC | PRN

## 2021-03-06 NOTE — Progress Notes (Signed)
Established Patient Office Visit     This visit occurred during the SARS-CoV-2 public health emergency.  Safety protocols were in place, including screening questions prior to the visit, additional usage of staff PPE, and extensive cleaning of exam room while observing appropriate contact time as indicated for disinfecting solutions.    CC/Reason for Visit: Neck and upper back pain  HPI: Shannon Sutton is a 75 y.o. female who is coming in today for the above mentioned reasons.  She has a history of Parkinson's disease and restless leg syndrome.  She is followed by neurologist out of [place].  She has been having upper neck and upper back pain for some time.  She believes that is related to her Parkinson's.  She has tried massage therapy without relief.  She is requesting a muscle relaxer.  Past Medical/Surgical History: Past Medical History:  Diagnosis Date  . Adenomatous colon polyp 1990  . Allergy   . Anxiety 12/15/2013  . Arthritis   . Cataract   . Chronic constipation   . Depression 11/16/2012  . Diverticulitis   . Diverticulosis   . 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 External hemorrhoids   . Fibromyalgia with myofascial pain 11/16/2012  . Headaches, cluster   . Hypercholesterolemia 12/15/2013  . Hypertension   . Insomnia 11/16/2012  . Myalgia and myositis, unspecified   . Other disorder of muscle, ligament, and fascia    eosinophilic fascitis  . Other specified disease of hair and hair follicles   . Pain in joint, multiple sites   . Restless leg syndrome 12/15/2013  . Unspecified sleep apnea   . Urine incontinence     Past Surgical History:  Procedure Laterality Date  . ABDOMINOPLASTY  2006   "tummy tuck"  . BREAST REDUCTION SURGERY  1995  . BREAST SURGERY  1981   left breast biopsy  . CATARACT EXTRACTION, BILATERAL    . MUSCLE BIOPSY  2009   DEEP TISSUE  . REDUCTION MAMMAPLASTY    .  REFRACTIVE SURGERY    . TONSILLECTOMY    . TOTAL ABDOMINAL HYSTERECTOMY  1999  . WISDOM TOOTH EXTRACTION      Social History:  reports that she quit smoking about 34 years ago. She has never used smokeless tobacco. She reports current alcohol use of about 1.0 standard drink of alcohol per week. She reports that she does not use drugs.  Allergies: Allergies  Allergen Reactions  . Crestor [Rosuvastatin Calcium]     abd pain  . Cymbalta [Duloxetine Hcl]     Nausea   . Effexor Xr [Venlafaxine Hcl Er]     dizziness  . Morphine And Related Itching    agitation  . Pravastatin     abd pain  . Promethazine-Codeine Itching    Itching in face  . Rizatriptan Other (See Comments)    Tingling, prickly sensation, feelings of palpitations, flushing.     Family History:  Family History  Problem Relation Age of Onset  . Colon cancer Father 62  . Heart disease Father   . Colon polyps Mother   . Heart disease Mother   . Heart attack Mother   . COPD Sister   . Emphysema Sister   . COPD Brother   . Emphysema Brother   . Lung cancer Brother   . Breast cancer Paternal Grandmother   . Heart disease Other  family history  . Parkinson's disease Other   . Heart disease Paternal Grandfather   . Colon polyps Paternal Grandfather   . Stomach cancer Neg Hx   . Rectal cancer Neg Hx   . Esophageal cancer Neg Hx      Current Outpatient Medications:  .  atorvastatin (LIPITOR) 40 MG tablet, Take 1 tablet (40 mg total) by mouth daily., Disp: 90 tablet, Rfl: 3 .  B Complex-C (SUPER B COMPLEX) TABS, Take 1 tablet by mouth daily. , Disp: , Rfl:  .  Carbidopa-Levodopa ER (SINEMET CR) 25-100 MG tablet controlled release, Take 1 tablet by mouth in the morning, at noon, in the evening, and at bedtime. Slow release (Patient taking differently: Take by mouth. Slow release.  Take one and half tab four times daily), Disp: 360 tablet, Rfl: 4 .  clonazePAM (KLONOPIN) 0.5 MG tablet, 2 qam & 2 at dinner,  Disp: 120 tablet, Rfl: 4 .  cyclobenzaprine (FLEXERIL) 5 MG tablet, Take 1 tablet (5 mg total) by mouth at bedtime as needed for muscle spasms., Disp: 30 tablet, Rfl: 1 .  hydrOXYzine (VISTARIL) 25 MG capsule, 1  q day prn for  Anxiety and if not relieved after 1 hour may repeat, Disp: 60 capsule, Rfl: 4 .  magnesium 30 MG tablet, Take 30 mg by mouth 2 (two) times daily. , Disp: , Rfl:  .  Multiple Vitamins-Minerals (MULTIVITAMIN WITH MINERALS) tablet, Take 1 tablet by mouth daily.  , Disp: , Rfl:  .  propranolol (INDERAL) 40 MG tablet, Take 40 mg by mouth 2 (two) times daily., Disp: , Rfl:  .  rOPINIRole (REQUIP) 1 MG tablet, Take 1 mg by mouth. 4 times a day, Disp: , Rfl:  .  traZODone (DESYREL) 50 MG tablet, 2  qhs, Disp: 60 tablet, Rfl: 4 .  RESTASIS 0.05 % ophthalmic emulsion, 1 drop 2 (two) times daily., Disp: , Rfl:   Review of Systems:  Constitutional: Denies fever, chills, diaphoresis, appetite change and fatigue.  HEENT: Denies photophobia, eye pain, redness, hearing loss, ear pain, congestion, sore throat, rhinorrhea, sneezing, mouth sores, trouble swallowing, neck pain, neck stiffness and tinnitus.   Respiratory: Denies cough, chest tightness,  and wheezing.   Cardiovascular: Denies chest pain, palpitations and leg swelling.  Gastrointestinal: Denies nausea, vomiting, abdominal pain, diarrhea, constipation, blood in stool and abdominal distention.  Genitourinary: Denies dysuria, urgency, frequency, hematuria, flank pain and difficulty urinating.  Endocrine: Denies: hot or cold intolerance, sweats, changes in hair or nails, polyuria, polydipsia. Musculoskeletal: Denies  joint swelling, arthralgias and gait problem.  Skin: Denies pallor, rash and wound.  Neurological: Denies dizziness, seizures, syncope, weakness, light-headedness, numbness and headaches.  Hematological: Denies adenopathy. Easy bruising, personal or family bleeding history  Psychiatric/Behavioral: Denies suicidal  ideation, mood changes, confusion, nervousness, sleep disturbance and agitation    Physical Exam: Vitals:   03/06/21 1330  BP: 114/68  Pulse: 67  Temp: 97.8 F (36.6 C)  TempSrc: Oral  SpO2: 98%  Weight: 130 lb 14.4 oz (59.4 kg)    Body mass index is 24.33 kg/m.   Constitutional: NAD, calm, comfortable Eyes: PERRL, lids and conjunctivae normal, wears corrective lenses ENMT: Mucous membranes are moist.  Respiratory: clear to auscultation bilaterally, no wheezing, no crackles. Normal respiratory effort. No accessory muscle use.  Cardiovascular: Regular rate and rhythm, no murmurs / rubs / gallops. No extremity edema. Psychiatric: Normal judgment and insight. Alert and oriented x 3. Normal mood.    Impression and Plan:  Neck  pain Upper back pain Parkinsonism, unspecified Parkinsonism type ([place])  -Have advised physical therapy, will also prescribe some Flexeril that she can use at bedtime as needed.  Have urged her to discuss Flexeril use with her neurologist to make sure no interaction with her Parkinson medications.  Time spent: 31 minutes reviewing chart, interviewing and examining patient and formulating plan of care.   Patient Instructions  -Nice seeing you today!!  -Start flexeril 5 mg at bedtime as needed for muscle spasms. Make sure your neurologist is ok with this medication.  -PT will be arranged.     Lelon Frohlich, MD [facility]

## 2021-03-06 NOTE — Patient Instructions (Signed)
-  Nice seeing you today!!  -Start flexeril 5 mg at bedtime as needed for muscle spasms. Make sure your neurologist is ok with this medication.  -PT will be arranged.

## 2021-03-07 ENCOUNTER — Telehealth: Payer: Self-pay | Admitting: *Deleted

## 2021-03-07 NOTE — Telephone Encounter (Signed)
PA started for Cyclobenzaprine HCL 5 mg Key: [id number]

## 2021-03-07 NOTE — Telephone Encounter (Signed)
PA has been approved

## 2021-03-08 ENCOUNTER — Encounter: Payer: Self-pay | Admitting: Internal Medicine

## 2021-03-13 ENCOUNTER — Telehealth (INDEPENDENT_AMBULATORY_CARE_PROVIDER_SITE_OTHER): Payer: Medicare HMO | Admitting: Psychiatry

## 2021-03-13 ENCOUNTER — Other Ambulatory Visit: Payer: Self-pay

## 2021-03-13 DIAGNOSIS — G20 Parkinson's disease: Secondary | ICD-10-CM

## 2021-03-13 DIAGNOSIS — F4323 Adjustment disorder with mixed anxiety and depressed mood: Secondary | ICD-10-CM

## 2021-03-13 DIAGNOSIS — R69 Illness, unspecified: Secondary | ICD-10-CM | POA: Diagnosis not present

## 2021-03-13 MED ORDER — HYDROXYZINE PAMOATE 25 MG PO CAPS: ORAL_CAPSULE | ORAL | 4 refills | Status: DC

## 2021-03-13 MED ORDER — CYCLOBENZAPRINE HCL 5 MG PO TABS: ORAL_TABLET | ORAL | 4 refills | Status: DC

## 2021-03-13 MED ORDER — CLONAZEPAM 0.5 MG PO TABS: ORAL_TABLET | ORAL | 4 refills | Status: DC

## 2021-03-13 MED ORDER — TRAZODONE HCL 50 MG PO TABS: ORAL_TABLET | ORAL | 4 refills | Status: DC

## 2021-03-13 NOTE — Progress Notes (Signed)
Psychiatric Initial Adult Assessment   Patient Identification: Shannon Sutton MRN:  [id number] Date of Evaluation:  03/13/2021 Referral Source Dr. Alain Marion Chief Complaint:  Depression Visit Diagnosis major depression single episode in remission  History of Present Illness  Today the patient is not really any better.  It is the patient is having a hard time following instructions.  I believe she is noncompliant.  She is very affected by having Parkinson's.  She describes internal anxiety feeling is shaking that is not apparent on the outside.  She suggested nobody understands that she is really anxious to internal and that she is not really trembling from her Parkinson's.  Patient has a lot of other somatic complaints.  She says her legs are really heavy.  She thinks it is related to restless legs but she will improve it is not with restless legs it is.  She says she feels crawling sensations like bugs are all.  Her legs are restless but she does not move them quickly to get relief.  She does not describe classic restless leg symptomatology.  Yet she is on Requip.  She is also on Sinemet and propranolol.  Overall the patient denies really overt depression.  She is very afflicted by her anxiety which says she says is making her crawl out of her skin.  The patient's sleep hygiene is horrible.  She goes to bed at 6:00 at night sleeps for about 2 or 3 hours gets up and then occasionally goes back to bed or not.  She will often wake up at 6 in the morning.  She will not sleep during the day.  Her sleep is very fragmented and disconnected.  She is eating fairly well.  She is not suicidal.  She has not appointment with her neurologist in the next 10 days.  It seems like many of her symptoms including psychologically are related to her reaction to having Parkinson's.  Depression Symptoms:  depressed mood, (Hypo) Manic Symptoms:   Anxiety Symptoms:   Psychotic Symptoms:   PTSD Symptoms:   Past Psychiatric  History: Prozac, Effexor,Vybrid  Previous Psychotropic Medications: Yes   Substance Abuse History in the last 12 months:  No.  Consequences of Substance Abuse:   Past Medical History:  Past Medical History:  Diagnosis Date  . Adenomatous colon polyp 1990  . Allergy   . Anxiety 12/15/2013  . Arthritis   . Cataract   . Chronic constipation   . Depression 11/16/2012  . Diverticulitis   . Diverticulosis   . Eosinophilic fasciitis 02/11/7781   Recurrent  Dr Trudie Reed 2009 had a bx/MRI: was on MTX and Prednisone  . External hemorrhoids   . Fibromyalgia with myofascial pain 11/16/2012  . Headaches, cluster   . Hypercholesterolemia 12/15/2013  . Hypertension   . Insomnia 11/16/2012  . Myalgia and myositis, unspecified   . Other disorder of muscle, ligament, and fascia    eosinophilic fascitis  . Other specified disease of hair and hair follicles   . Pain in joint, multiple sites   . Restless leg syndrome 12/15/2013  . Unspecified sleep apnea   . Urine incontinence     Past Surgical History:  Procedure Laterality Date  . ABDOMINOPLASTY  2006   "tummy tuck"  . BREAST REDUCTION SURGERY  1995  . BREAST SURGERY  1981   left breast biopsy  . CATARACT EXTRACTION, BILATERAL    . MUSCLE BIOPSY  2009   DEEP TISSUE  . REDUCTION MAMMAPLASTY    .  REFRACTIVE SURGERY    . TONSILLECTOMY    . TOTAL ABDOMINAL HYSTERECTOMY  1999  . WISDOM TOOTH EXTRACTION      Family Psychiatric History:   Family History:  Family History  Problem Relation Age of Onset  . Colon cancer Father 33  . Heart disease Father   . Colon polyps Mother   . Heart disease Mother   . Heart attack Mother   . COPD Sister   . Emphysema Sister   . COPD Brother   . Emphysema Brother   . Lung cancer Brother   . Breast cancer Paternal Grandmother   . Heart disease Other        family history  . Parkinson's disease Other   . Heart disease Paternal Grandfather   . Colon polyps Paternal Grandfather   . Stomach cancer Neg Hx    . Rectal cancer Neg Hx   . Esophageal cancer Neg Hx     Social History:   Social History   Socioeconomic History  . Marital status: Divorced    Spouse name: Not on file  . Number of children: 2  . Years of education: Not on file  . Highest education level: Not on file  Occupational History  . Occupation: Retired    Fish farm manager: RETIRED    CommentAstronomer  Tobacco Use  . Smoking status: Former Smoker    Quit date: 12/19/1986    Years since quitting: 34.2  . Smokeless tobacco: Never Used  Vaping Use  . Vaping Use: Never used  Substance and Sexual Activity  . Alcohol use: Yes    Alcohol/week: 1.0 standard drink    Types: 1 Glasses of wine per week    Comment: 2-3 x year  . Drug use: No  . Sexual activity: Yes    Birth control/protection: Surgical, Post-menopausal  Other Topics Concern  . Not on file  Social History Narrative   Lives alone.  Retired.     Education- 2 years of college   2 daughters   caffeine daily 2-3 cups   Social Determinants of Health   Financial Resource Strain: Not on file  Food Insecurity: Not on file  Transportation Needs: Not on file  Physical Activity: Not on file  Stress: Not on file  Social Connections: Not on file    Additional Social History:   Allergies:   Allergies  Allergen Reactions  . Crestor [Rosuvastatin Calcium]     abd pain  . Cymbalta [Duloxetine Hcl]     Nausea   . Effexor Xr [Venlafaxine Hcl Er]     dizziness  . Morphine And Related Itching    agitation  . Pravastatin     abd pain  . Promethazine-Codeine Itching    Itching in face  . Rizatriptan Other (See Comments)    Tingling, prickly sensation, feelings of palpitations, flushing.     Metabolic Disorder Labs: Lab Results  Component Value Date   HGBA1C 5.0 02/18/2019   No results found for: PROLACTIN Lab Results  Component Value Date   CHOL 171 06/20/2020   TRIG 65 06/20/2020   HDL 89 06/20/2020   CHOLHDL 1.9 06/20/2020   VLDL 11.4  03/18/2018   LDLCALC 69 06/20/2020   LDLCALC 84 05/16/2019     Current Medications: Current Outpatient Medications  Medication Sig Dispense Refill  . atorvastatin (LIPITOR) 40 MG tablet Take 1 tablet (40 mg total) by mouth daily. 90 tablet 3  . B Complex-C (SUPER B COMPLEX) TABS Take  1 tablet by mouth daily.     . Carbidopa-Levodopa ER (SINEMET CR) 25-100 MG tablet controlled release Take 1 tablet by mouth in the morning, at noon, in the evening, and at bedtime. Slow release (Patient taking differently: Take by mouth. Slow release.  Take one and half tab four times daily) 360 tablet 4  . clonazePAM (KLONOPIN) 0.5 MG tablet 2 qam & 2 at dinner 120 tablet 4  . cyclobenzaprine (FLEXERIL) 5 MG tablet 2  qhs 60 tablet 4  . hydrOXYzine (VISTARIL) 25 MG capsule 1  q day prn for  Anxiety and if not relieved after 1 hour may repeat 60 capsule 4  . magnesium 30 MG tablet Take 30 mg by mouth 2 (two) times daily.     . Multiple Vitamins-Minerals (MULTIVITAMIN WITH MINERALS) tablet Take 1 tablet by mouth daily.      . propranolol (INDERAL) 40 MG tablet Take 40 mg by mouth 2 (two) times daily.    . RESTASIS 0.05 % ophthalmic emulsion 1 drop 2 (two) times daily.    Marland Kitchen rOPINIRole (REQUIP) 1 MG tablet Take 1 mg by mouth. 4 times a day    . traZODone (DESYREL) 50 MG tablet 2  qhs 60 tablet 4   No current facility-administered medications for this visit.    Neurologic: Headache: No Seizure: No Paresthesias:No  Musculoskeletal: Strength & Muscle Tone: abnormal Gait & Station: unsteady Patient leans: N/A  Psychiatric Specialty Exam: ROS  There were no vitals taken for this visit.There is no height or weight on file to calculate BMI.  General Appearance: Fairly Groomed  Eye Contact:  Good  Speech:  Clear and Coherent  Volume:  Normal  Mood:  Depressed  Affect:  Appropriate  Thought Process:  Goal Directed  Orientation:  Full (Time, Place, and Person)  Thought Content:  WDL  Suicidal Thoughts:   No  Homicidal Thoughts:  No  Memory:  NA  Judgement:  Good  Insight:  Fair  Psychomotor Activity:  Normal  Concentration:    Recall:  [place] of Knowledge:Good  Language: Fair  Akathisia:  No  Handed:  Right  AIMS (if indicated):    Assets:  Desire for Improvement  ADL's:  Intact  Cognition: WNL  Sleep:      Treatment Plan Summary:  6/1/20223:16 PM  This patient's first problem is that of an adjustment disorder with an anxious mood state.  She is taking Klonopin inaccurately.  She will take it sometimes in the morning and sometimes not.  Today we clarified that I insist that she take 0.5 mg 2 of them every morning and 2 of them every night and taking none during the day.  Sometimes she will take some in an erratic way during the day.  I shared with her that if she feels anxious during the day she should take Vistaril 25 mg and if she does not feel better after an hour to repeat it.  She has insomnia.  She will continue taking trazodone 50 mg.  I shared with her that she should take to the 50 mg as she should also take the Flexeril 5 mg 2 of them altogether at night but only if she can stay up to about 10:00 at night.  My hope is to get her sleep cycle better.  I fear her erratic use of Klonopin is causing a problem.  Today I clarified once again taking medicines just as prescribed Klonopin 2 in the morning and 2 at  night to trazodone tonight and 2 Flexeril's at night.  Patient is not on an antidepressant.  She will see her neurologist and she will come back to see me in approximately 2-1/2 months.

## 2021-03-14 ENCOUNTER — Ambulatory Visit: Payer: Medicare HMO | Admitting: Physical Therapy

## 2021-03-18 ENCOUNTER — Encounter: Payer: Self-pay | Admitting: Internal Medicine

## 2021-03-18 ENCOUNTER — Ambulatory Visit: Payer: Medicare HMO | Attending: Internal Medicine

## 2021-03-18 ENCOUNTER — Other Ambulatory Visit: Payer: Self-pay

## 2021-03-18 DIAGNOSIS — R252 Cramp and spasm: Secondary | ICD-10-CM

## 2021-03-18 DIAGNOSIS — M542 Cervicalgia: Secondary | ICD-10-CM

## 2021-03-18 DIAGNOSIS — M546 Pain in thoracic spine: Secondary | ICD-10-CM | POA: Insufficient documentation

## 2021-03-18 DIAGNOSIS — M6281 Muscle weakness (generalized): Secondary | ICD-10-CM | POA: Insufficient documentation

## 2021-03-18 DIAGNOSIS — R293 Abnormal posture: Secondary | ICD-10-CM | POA: Insufficient documentation

## 2021-03-18 NOTE — Therapy (Signed)
Pt arrived at the clinic and checked in.  Pt began to feel ill while waiting and left prior to treatment.    [facility] [address], [place], [place], [address] Phone: [phone]   Fax:  [phone]  Patient Details  Name: Shannon Sutton MRN: [id number] Date of Birth: 04/21/1946 Referring Provider:  Isaac Bliss, [place]*  Encounter Date: 03/18/2021   Dawson Albers 03/18/2021, 12:25 PM  [facility] [address] [place], [place], [address] Phone: [phone]   Fax:  [phone]

## 2021-04-10 ENCOUNTER — Other Ambulatory Visit: Payer: Self-pay

## 2021-04-10 ENCOUNTER — Ambulatory Visit: Payer: Medicare HMO

## 2021-04-10 DIAGNOSIS — R252 Cramp and spasm: Secondary | ICD-10-CM | POA: Diagnosis not present

## 2021-04-10 DIAGNOSIS — M542 Cervicalgia: Secondary | ICD-10-CM | POA: Diagnosis not present

## 2021-04-10 DIAGNOSIS — M546 Pain in thoracic spine: Secondary | ICD-10-CM | POA: Diagnosis not present

## 2021-04-10 DIAGNOSIS — M6281 Muscle weakness (generalized): Secondary | ICD-10-CM

## 2021-04-10 DIAGNOSIS — R293 Abnormal posture: Secondary | ICD-10-CM | POA: Diagnosis not present

## 2021-04-10 NOTE — Therapy (Signed)
[facility] [address] [place], [place], [address] Phone: [phone]   Fax:  [phone]  Physical Therapy Evaluation  Patient Details  Name: Shannon Sutton MRN: [id number] Date of Birth: 09-07-1946 Referring Provider (PT): Domingo Mend, MD   Encounter Date: 04/10/2021   PT End of Session - 04/10/21 1250     Visit Number 1    Date for PT Re-Evaluation 06/05/21    Authorization Type [employer] Medicare    Progress Note Due on Visit 10    PT Start Time 1206    PT Stop Time 1250    PT Time Calculation (min) 44 min    Activity Tolerance Patient tolerated treatment well    Behavior During Therapy [facility] for tasks assessed/performed             Past Medical History:  Diagnosis Date   Adenomatous colon polyp 1990   Allergy    Anxiety 12/15/2013   Arthritis    Cataract    Chronic constipation    Depression 11/16/2012   Diverticulitis    Diverticulosis    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  Dr Trudie Reed 2009 had a bx/MRI: was on MTX and Prednisone   External hemorrhoids    Fibromyalgia with myofascial pain 11/16/2012   Headaches, cluster    Hypercholesterolemia 12/15/2013   Hypertension    Insomnia 11/16/2012   Myalgia and myositis, unspecified    Other disorder of muscle, ligament, and fascia    eosinophilic fascitis   Other specified disease of hair and hair follicles    Pain in joint, multiple sites    Restless leg syndrome 12/15/2013   Unspecified sleep apnea    Urine incontinence     Past Surgical History:  Procedure Laterality Date   ABDOMINOPLASTY  2006   "tummy tuck"   [place]abella   left breast biopsy   CATARACT EXTRACTION, BILATERAL     MUSCLE BIOPSY  2009   DEEP TISSUE   REDUCTION MAMMAPLASTY     REFRACTIVE SURGERY     TONSILLECTOMY     TOTAL ABDOMINAL HYSTERECTOMY  1999   WISDOM TOOTH EXTRACTION      There were no vitals filed for this  visit.    Subjective Assessment - 04/10/21 1213     Subjective Pt reports to PT with ~4 month history of neck and thoracic pain that began without incident or injury.  Pt was diagnosed with Parkinson's disease 1 year ago but reports that she does not have the classic symptoms.    Pertinent History anxiety, Parkinson's, HTN, widespread pain, Fibromyalgia, sleep apnea    How long can you sit comfortably? not able to sit for more than 5 min    How long can you stand comfortably? due to weakness, not pain.  Max 15 min    How long can you walk comfortably? Max 15 minutes    Diagnostic tests none for neck or thoracic.  Brain MRI: no acute findings    Patient Stated Goals "feel normal again", reduce neck and thoracic pain    Currently in Pain? Yes    Pain Score 5     Pain Location Neck    Pain Orientation Left;Right    Pain Descriptors / Indicators Tightness;Discomfort    Pain Type Chronic pain    Pain Radiating Towards thoracic spine    Pain Onset More than a month ago    Pain Frequency  [facility] [address] [place], [place], [address] Phone: [phone]   Fax:  [phone]  Physical Therapy Evaluation  Patient Details  Name: Shannon Sutton MRN: [id number] Date of Birth: 09-07-1946 Referring Provider (PT): Domingo Mend, MD   Encounter Date: 04/10/2021   PT End of Session - 04/10/21 1250     Visit Number 1    Date for PT Re-Evaluation 06/05/21    Authorization Type [employer] Medicare    Progress Note Due on Visit 10    PT Start Time 1206    PT Stop Time 1250    PT Time Calculation (min) 44 min    Activity Tolerance Patient tolerated treatment well    Behavior During Therapy [facility] for tasks assessed/performed             Past Medical History:  Diagnosis Date   Adenomatous colon polyp 1990   Allergy    Anxiety 12/15/2013   Arthritis    Cataract    Chronic constipation    Depression 11/16/2012   Diverticulitis    Diverticulosis    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  Dr Trudie Reed 2009 had a bx/MRI: was on MTX and Prednisone   External hemorrhoids    Fibromyalgia with myofascial pain 11/16/2012   Headaches, cluster    Hypercholesterolemia 12/15/2013   Hypertension    Insomnia 11/16/2012   Myalgia and myositis, unspecified    Other disorder of muscle, ligament, and fascia    eosinophilic fascitis   Other specified disease of hair and hair follicles    Pain in joint, multiple sites    Restless leg syndrome 12/15/2013   Unspecified sleep apnea    Urine incontinence     Past Surgical History:  Procedure Laterality Date   ABDOMINOPLASTY  2006   "tummy tuck"   Isabella   left breast biopsy   CATARACT EXTRACTION, BILATERAL     MUSCLE BIOPSY  2009   DEEP TISSUE   REDUCTION MAMMAPLASTY     REFRACTIVE SURGERY     TONSILLECTOMY     TOTAL ABDOMINAL HYSTERECTOMY  1999   WISDOM TOOTH EXTRACTION      There were no vitals filed for this  visit.    Subjective Assessment - 04/10/21 1213     Subjective Pt reports to PT with ~4 month history of neck and thoracic pain that began without incident or injury.  Pt was diagnosed with Parkinson's disease 1 year ago but reports that she does not have the classic symptoms.    Pertinent History anxiety, Parkinson's, HTN, widespread pain, Fibromyalgia, sleep apnea    How long can you sit comfortably? not able to sit for more than 5 min    How long can you stand comfortably? due to weakness, not pain.  Max 15 min    How long can you walk comfortably? Max 15 minutes    Diagnostic tests none for neck or thoracic.  Brain MRI: no acute findings    Patient Stated Goals "feel normal again", reduce neck and thoracic pain    Currently in Pain? Yes    Pain Score 5     Pain Location Neck    Pain Orientation Left;Right    Pain Descriptors / Indicators Tightness;Discomfort    Pain Type Chronic pain    Pain Radiating Towards thoracic spine    Pain Onset More than a month ago    Pain Frequency  2    Comorbidities Parkinson's, Fibromyalgia    Examination-Activity Limitations Carry;Sit;Stand    Examination-Participation Restrictions Community Activity;Driving;Shop;Meal Prep    Stability/Clinical Decision Making Evolving/Moderate complexity    Clinical Decision Making Moderate    Rehab Potential Good    PT Frequency 2x / week    PT Duration 8 weeks    PT Treatment/Interventions ADLs/Self Care Home Management;Cryotherapy;Electrical Stimulation;Traction;Therapeutic activities;Functional mobility training;Therapeutic exercise;Neuromuscular re-education;Patient/family education;Dry needling;Joint Manipulations;Spinal Manipulations;Taping    PT Next Visit Plan Dry needling to thoracic and cervical spine, postural strength (start in supine), work on sitting tolerance, review HEP issued today    PT Home Exercise Plan Access Code: [email or web address]    Consulted and Agree with Plan of Care Patient             Patient will benefit from skilled therapeutic intervention in order to improve the following deficits and impairments:  Decreased activity tolerance, Decreased strength, Postural dysfunction, Impaired flexibility, Pain, Increased muscle spasms, Hypomobility, Decreased range of motion  Visit Diagnosis: Cervicalgia -  Plan: PT plan of care cert/re-cert  Pain in thoracic spine - Plan: PT plan of care cert/re-cert  Abnormal posture - Plan: PT plan of care cert/re-cert  Cramp and spasm - Plan: PT plan of care cert/re-cert  Muscle weakness (generalized) - Plan: PT plan of care cert/re-cert     Problem List Patient Active Problem List   Diagnosis Date Noted   Vitamin D deficiency 08/15/2020   Ataxia 11/23/2017   Dizzinesses 05/18/2017   Constipation 05/29/2016   Fatigue 08/30/2014   Generalized headaches 08/30/2014   Dyslipidemia 12/15/2013   Restless leg syndrome 12/15/2013   Anxiety 12/15/2013   Fibromyalgia with myofascial pain 11/16/2012   Insomnia 11/16/2012   Depression 11/16/2012    Sigurd Sos, PT 04/10/21 1:10 PM  [facility] [address] [place], [place], [address] Phone: [phone]   Fax:  [phone]  Name: Shannon Sutton MRN: [id number] Date of Birth: [birth year]  2    Comorbidities Parkinson's, Fibromyalgia    Examination-Activity Limitations Carry;Sit;Stand    Examination-Participation Restrictions Community Activity;Driving;Shop;Meal Prep    Stability/Clinical Decision Making Evolving/Moderate complexity    Clinical Decision Making Moderate    Rehab Potential Good    PT Frequency 2x / week    PT Duration 8 weeks    PT Treatment/Interventions ADLs/Self Care Home Management;Cryotherapy;Electrical Stimulation;Traction;Therapeutic activities;Functional mobility training;Therapeutic exercise;Neuromuscular re-education;Patient/family education;Dry needling;Joint Manipulations;Spinal Manipulations;Taping    PT Next Visit Plan Dry needling to thoracic and cervical spine, postural strength (start in supine), work on sitting tolerance, review HEP issued today    PT Home Exercise Plan Access Code: [email or web address]    Consulted and Agree with Plan of Care Patient             Patient will benefit from skilled therapeutic intervention in order to improve the following deficits and impairments:  Decreased activity tolerance, Decreased strength, Postural dysfunction, Impaired flexibility, Pain, Increased muscle spasms, Hypomobility, Decreased range of motion  Visit Diagnosis: Cervicalgia -  Plan: PT plan of care cert/re-cert  Pain in thoracic spine - Plan: PT plan of care cert/re-cert  Abnormal posture - Plan: PT plan of care cert/re-cert  Cramp and spasm - Plan: PT plan of care cert/re-cert  Muscle weakness (generalized) - Plan: PT plan of care cert/re-cert     Problem List Patient Active Problem List   Diagnosis Date Noted   Vitamin D deficiency 08/15/2020   Ataxia 11/23/2017   Dizzinesses 05/18/2017   Constipation 05/29/2016   Fatigue 08/30/2014   Generalized headaches 08/30/2014   Dyslipidemia 12/15/2013   Restless leg syndrome 12/15/2013   Anxiety 12/15/2013   Fibromyalgia with myofascial pain 11/16/2012   Insomnia 11/16/2012   Depression 11/16/2012    Sigurd Sos, PT 04/10/21 1:10 PM  [facility] [address] [place], [place], [address] Phone: [phone]   Fax:  [phone]  Name: Shannon Sutton MRN: [id number] Date of Birth: [birth year]

## 2021-04-10 NOTE — Patient Instructions (Signed)
Access Code: [email or web address] URL: [email or web address] Date: 04/10/2021 Prepared by: Claiborne Billings  Exercises Cat-Camel - 2 x daily - 7 x weekly - 10 reps - 1 sets - 5 hold Child's Pose Stretch - 1 x daily - 7 x weekly - 1 sets - 3 reps - 20 hold Child's Pose with Sidebending - 1 x daily - 7 x weekly - 1 sets - 3 reps - 20 hold Seated Cervical Sidebending AROM - 3 x daily - 7 x weekly - 1 sets - 3 reps - 20-30 hold

## 2021-04-12 DIAGNOSIS — Z880 Allergy status to penicillin: Secondary | ICD-10-CM | POA: Diagnosis not present

## 2021-04-12 DIAGNOSIS — G20 Parkinson's disease: Secondary | ICD-10-CM | POA: Diagnosis not present

## 2021-04-12 DIAGNOSIS — Z79899 Other long term (current) drug therapy: Secondary | ICD-10-CM | POA: Diagnosis not present

## 2021-04-12 DIAGNOSIS — I951 Orthostatic hypotension: Secondary | ICD-10-CM | POA: Diagnosis not present

## 2021-04-12 DIAGNOSIS — Z885 Allergy status to narcotic agent status: Secondary | ICD-10-CM | POA: Diagnosis not present

## 2021-04-12 DIAGNOSIS — R4189 Other symptoms and signs involving cognitive functions and awareness: Secondary | ICD-10-CM | POA: Diagnosis not present

## 2021-04-12 DIAGNOSIS — Z888 Allergy status to other drugs, medicaments and biological substances status: Secondary | ICD-10-CM | POA: Diagnosis not present

## 2021-04-19 ENCOUNTER — Telehealth (HOSPITAL_COMMUNITY): Payer: Self-pay

## 2021-04-19 NOTE — Telephone Encounter (Addendum)
Patient called and stated that she needs something better than Clonazepam for her anxiety. She stated that it's beginning to not work and she feels like she's crawling out of her skin. Please review and advise. Thank you  Writer spoke again with patient and she stated that she would wait until her scheduled followup appointment on 05/09/21 to discuss alternative medications

## 2021-04-23 ENCOUNTER — Ambulatory Visit: Payer: Medicare HMO | Attending: Internal Medicine

## 2021-04-23 ENCOUNTER — Other Ambulatory Visit: Payer: Self-pay

## 2021-04-23 DIAGNOSIS — R293 Abnormal posture: Secondary | ICD-10-CM

## 2021-04-23 DIAGNOSIS — M546 Pain in thoracic spine: Secondary | ICD-10-CM | POA: Diagnosis not present

## 2021-04-23 DIAGNOSIS — M6281 Muscle weakness (generalized): Secondary | ICD-10-CM | POA: Diagnosis not present

## 2021-04-23 DIAGNOSIS — M542 Cervicalgia: Secondary | ICD-10-CM | POA: Diagnosis not present

## 2021-04-23 DIAGNOSIS — R252 Cramp and spasm: Secondary | ICD-10-CM | POA: Diagnosis not present

## 2021-04-23 NOTE — Patient Instructions (Signed)

## 2021-04-23 NOTE — Therapy (Signed)
[facility] [address] [place], [place], [address] Phone: [phone]   Fax:  [phone]  Physical Therapy Treatment  Patient Details  Name: Shannon Sutton MRN: [id number] Date of Birth: 03-01-1946 Referring Provider (PT): Peggye Pitt, MD   Encounter Date: 04/23/2021   PT End of Session - 04/23/21 1227     Visit Number 2    Date for PT Re-Evaluation 06/05/21    Authorization Type [employer] Medicare    Progress Note Due on Visit 10    PT Start Time 1138    PT Stop Time 1219    PT Time Calculation (min) 41 min    Activity Tolerance Patient tolerated treatment well    Behavior During Therapy [facility] for tasks assessed/performed             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   Recurrent  Dr Nickola Major 2009 had a bx/MRI: was on MTX and Prednisone   External hemorrhoids    Fibromyalgia with myofascial pain 11/16/2012   Headaches, cluster    Hypercholesterolemia 12/15/2013   Hypertension    Insomnia 11/16/2012   Myalgia and myositis, unspecified    Other disorder of muscle, ligament, and fascia    eosinophilic fascitis   Other specified disease of hair and hair follicles    Pain in joint, multiple sites    Restless leg syndrome 12/15/2013   Unspecified sleep apnea    Urine incontinence     Past Surgical History:  Procedure Laterality Date   ABDOMINOPLASTY  2006   "tummy tuck"   BREAST REDUCTION SURGERY  1995   BREAST SURGERY  1981   left breast biopsy   CATARACT EXTRACTION, BILATERAL     MUSCLE BIOPSY  2009   DEEP TISSUE   REDUCTION MAMMAPLASTY     REFRACTIVE SURGERY     TONSILLECTOMY     TOTAL ABDOMINAL HYSTERECTOMY  1999   WISDOM TOOTH EXTRACTION      There were no vitals filed for this  visit.   Subjective Assessment - 04/23/21 1133     Subjective I am not good today.  I'm really dizzy.  the exercises are helping my thoracic spine but the neck is still really hurting.    Currently in Pain? Yes    Pain Score 5     Pain Location Neck    Pain Orientation Right;Left    Pain Descriptors / Indicators Tightness;Discomfort    Pain Onset More than a month ago    Pain Frequency Constant    Aggravating Factors  sitting upright, constant    Pain Relieving Factors pulling on the muscle.  Supine on the floor.                               [facility] Adult PT Treatment/Exercise - 04/23/21 0001       Exercises   Exercises Neck      Manual Therapy   Manual Therapy Soft tissue mobilization;Myofascial release    Manual therapy comments bil upper traps and cervical paraspinals    Soft tissue mobilization skilled palpation and monitoring during DN today      Neck Exercises: Stretches  Upper Trapezius Stretch 2 reps;20 seconds    Other Neck Stretches cervical ROM- rotation and flexion2x20 seconds              Trigger Point Dry Needling - 04/23/21 0001     Consent Given? Yes    Education Handout Provided Yes    Muscles Treated Head and Neck Oblique capitus;Upper trapezius;Cervical multifidi    Upper Trapezius Response Twitch reponse elicited;Palpable increased muscle length    Oblique Capitus Response Twitch response elicited;Palpable increased muscle length    Cervical multifidi Response Twitch reponse elicited;Palpable increased muscle length                  PT Education - 04/23/21 1146     Education Details DN info, verbally added cervical rotation and flexion    Person(s) Educated Patient    Methods Explanation;Demonstration    Comprehension Verbalized understanding;Returned demonstration              PT Short Term Goals - 04/10/21 1205       PT SHORT TERM GOAL #1   Title be independent in initial HEP    Time 4    Period  Weeks    Status New    Target Date 05/08/21      PT SHORT TERM GOAL #2   Title reduce neck pain to sit for 10 minutes without limitation due to neck and LBP    Time 4    Period Weeks    Status New    Target Date 05/08/21      PT SHORT TERM GOAL #3   Title report a 30% reduction in the frequency and intensity of neck/thoracic pain with daily tasks    Time 4    Period Weeks    Status New    Target Date 05/08/21               PT Long Term Goals - 04/10/21 1205       PT LONG TERM GOAL #1   Title be independent in advanced HEP    Time 8    Period Weeks    Status New    Target Date 06/05/21      PT LONG TERM GOAL #2   Title improve FOTO to > or = to 65    Time 8    Period Weeks    Status New    Target Date 06/05/21      PT LONG TERM GOAL #3   Title report a 60% reduction in neck and thoracic pain with ADLs and self-care    Time 8    Period Weeks    Status New    Target Date 06/05/21      PT LONG TERM GOAL #4   Title reduce pain and improve functional strength to sit for > or = to 20-30 minutes without limitation to improve function    Time 8    Period Weeks    Status New    Target Date 06/05/21                   Plan - 04/23/21 1226     Clinical Impression Statement Lapse in treatment since initial session.  Pt reports that flexibility exercises have helped her mid back but her neck is still very painful.  Pt was able to sit in the waiting area today without limitation.  Pt with elevated scapula bilaterally in sitting and required frequent tactile and verbal cues for alignment.  PT added cervical rotation and flexion to HEP and educated pt on the importance of flexibility exercises to supplement DN.  Pt with good twitch response and improved tissue mobility after manual therapy today. Pt will benefit from continued PT to address thoracic and neck pain, spinal mobility and postural strength.    Rehab Potential Good    PT Frequency 2x / week    PT  Duration 8 weeks    PT Treatment/Interventions ADLs/Self Care Home Management;Cryotherapy;Electrical Stimulation;Traction;Therapeutic activities;Functional mobility training;Therapeutic exercise;Neuromuscular re-education;Patient/family education;Dry needling;Joint Manipulations;Spinal Manipulations;Taping    PT Next Visit Plan cervical mobs, thoracic spinal mobs, att thoracic strength to  HEP    PT Home Exercise Plan Access Code: [email or web address]    Consulted and Agree with Plan of Care Patient             Patient will benefit from skilled therapeutic intervention in order to improve the following deficits and impairments:  Decreased activity tolerance, Decreased strength, Postural dysfunction, Impaired flexibility, Pain, Increased muscle spasms, Hypomobility, Decreased range of motion  Visit Diagnosis: Pain in thoracic spine  Abnormal posture  Cramp and spasm  Muscle weakness (generalized)     Problem List Patient Active Problem List   Diagnosis Date Noted   Vitamin D deficiency 08/15/2020   Ataxia 11/23/2017   Dizzinesses 05/18/2017   Constipation 05/29/2016   Fatigue 08/30/2014   Generalized headaches 08/30/2014   Dyslipidemia 12/15/2013   Restless leg syndrome 12/15/2013   Anxiety 12/15/2013   Fibromyalgia with myofascial pain 11/16/2012   Insomnia 11/16/2012   Depression 11/16/2012   Lorrene Reid, PT 04/23/21 12:31 PM   [facility] [address] [place], [place], [address] Phone: [phone]   Fax:  [phone]  Name: RAYYA SLIMAN MRN: [id number] Date of Birth: [birth year]

## 2021-04-30 ENCOUNTER — Ambulatory Visit: Payer: Medicare HMO | Admitting: Physical Therapy

## 2021-04-30 ENCOUNTER — Encounter: Payer: Medicare HMO | Admitting: Physical Therapy

## 2021-05-01 ENCOUNTER — Telehealth (HOSPITAL_COMMUNITY): Payer: Self-pay

## 2021-05-01 NOTE — Telephone Encounter (Signed)
Please call Dr Casimiro Needle. He did some changes in her meds.

## 2021-05-01 NOTE — Telephone Encounter (Addendum)
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 Patient called and stated that she's "going crazy" and can't sleep. She stated that she's wobbling all over the house and she can't drive. She has tried OTC Melatonin but stated that it's not working. She would like to know if she can get something. Can writer send something in or could you please call her at [phone]. Thank you  We were informed that you're covering for Dr. Casimiro Needle. Patient can't drive due to the wobbliness to get to urgent care and does not want to call 911. Patient is not aware of any med changes and we can't see it indicated in the notes. Is there something that can be done for her in the interim until her appointment with Dr. Casimiro Needle on next Thursday 7/28. Please review and advise. Thank you

## 2021-05-02 ENCOUNTER — Telehealth (HOSPITAL_COMMUNITY): Payer: Self-pay

## 2021-05-02 NOTE — Telephone Encounter (Addendum)
Writer spoke with the patient, per Dr. Adele Schilder, to confirm the medications she's taking. They are: Ropinirole 1mg  4x/day Trazodone 2 qhs Hydroxyzine 25mg  1qd. Directions state that she may repeat if needed after 1 hour. She stated that she does usually repeat this one  Flexeril 5mg  2 qhs Clonazepam 0.5mg  2 qam, 2 qhs  She stated that she doesn't think the Flexeril and Clonazepam are helping anymore Patient also stated that she's taking the medication she gets for her Parkinson's and Donepezil 5mg  1 qhs that she gets from her Neurologist ([facility] Thu Vo Nyoka Cowden) for forgetfullnes  Please advise. Thank you   WRITER SPOKE WITH PATIENT AND RELAYED MESSAGE. ALSO SENT IN MIRTAZAPINE 15MG  TO PHARMACY PER DR. ARFEEN

## 2021-05-02 NOTE — Telephone Encounter (Signed)
If she feels the Flexeril and Klonopin not working then she need to stop these medication.  She should start hydroxyzine and trazodone.  She can try mirtazapine 15 mg at bedtime.  If she agree then please call the prescription to her pharmacy.

## 2021-05-02 NOTE — Telephone Encounter (Signed)
6/1/20223:16 PM  This patient's first problem is that of an adjustment disorder with an anxious mood state.  She is taking Klonopin inaccurately.  She will take it sometimes in the morning and sometimes not.  Today we clarified that I insist that she take 0.5 mg 2 of them every morning and 2 of them every night and taking none during the day.  Sometimes she will take some in an erratic way during the day.  I shared with her that if she feels anxious during the day she should take Vistaril 25 mg and if she does not feel better after an hour to repeat it.  She has insomnia.  She will continue taking trazodone 50 mg.  I shared with her that she should take to the 50 mg as she should also take the Flexeril 5 mg 2 of them altogether at night but only if she can stay up to about 10:00 at night.  My hope is to get her sleep cycle better.  I fear her erratic use of Klonopin is causing a problem.  Today I clarified once again taking medicines just as prescribed Klonopin 2 in the morning and 2 at night to trazodone tonight and 2 Flexeril's at night.  Patient is not on an antidepressant.  She will see her neurologist and she will come back to see me in approximately 2-1/2 months.  Please see above Dr Casimiro Needle note. Can you call and ask her if she is taking these medication as directed.

## 2021-05-03 ENCOUNTER — Other Ambulatory Visit (HOSPITAL_COMMUNITY): Payer: Self-pay

## 2021-05-03 ENCOUNTER — Telehealth (HOSPITAL_COMMUNITY): Payer: Self-pay

## 2021-05-03 MED ORDER — MIRTAZAPINE 15 MG PO TABS: 15.0000 mg | ORAL_TABLET | Freq: Every day | ORAL | 0 refills | Status: DC

## 2021-05-03 NOTE — Telephone Encounter (Signed)
WRITER SPOKE WITH PATIENT AND RELAYED MESSAGE. SENT IN MIRTAZPINE '15MG'$  TO PHARMACY PER DR. ARFEEN

## 2021-05-07 ENCOUNTER — Other Ambulatory Visit (HOSPITAL_COMMUNITY): Payer: Self-pay | Admitting: Psychiatry

## 2021-05-07 ENCOUNTER — Ambulatory Visit: Payer: Medicare HMO | Admitting: Physical Therapy

## 2021-05-07 ENCOUNTER — Other Ambulatory Visit: Payer: Self-pay

## 2021-05-07 DIAGNOSIS — R252 Cramp and spasm: Secondary | ICD-10-CM

## 2021-05-07 DIAGNOSIS — M6281 Muscle weakness (generalized): Secondary | ICD-10-CM

## 2021-05-07 DIAGNOSIS — M546 Pain in thoracic spine: Secondary | ICD-10-CM

## 2021-05-07 DIAGNOSIS — M542 Cervicalgia: Secondary | ICD-10-CM

## 2021-05-07 DIAGNOSIS — R293 Abnormal posture: Secondary | ICD-10-CM

## 2021-05-07 NOTE — Therapy (Signed)
trap regions.  She states DN has been the only thing that helps in the past.  Numerous tender points in upper traps, SCM  which is reflective of overuse of accessory muscles.  DN and manual therapy with improved soft tissue length and mobility following treatment.  Discussed use of diagphragmatic breathing and meditation to decrease neck accessory muscle overuse.  CGA upon rising from lobby chair secondary to dizziness.   Therapist closely monitoring response throughout treatment session.    Personal Factors and Comorbidities Comorbidity 2    Comorbidities Parkinson's, Fibromyalgia    Examination-Participation Restrictions Community Activity;Driving;Shop;Meal Prep    Rehab Potential Good    PT Frequency 2x / week    PT Duration 8 weeks    PT Treatment/Interventions ADLs/Self Care Home Management;Cryotherapy;Electrical Stimulation;Traction;Therapeutic activities;Functional mobility training;Therapeutic exercise;Neuromuscular re-education;Patient/family education;Dry needling;Joint Manipulations;Spinal Manipulations;Taping    PT Next Visit Plan check STGS next visit;  DN;  follow up on diaphragmatic breathing/meditation; cervical mobs, thoracic spinal mobs, thoracic strength to  HEP    PT Home Exercise Plan Access Code: [email or web address]             Patient will benefit from skilled therapeutic intervention in order to improve the following deficits and impairments:  Decreased activity tolerance, Decreased strength, Postural dysfunction, Impaired flexibility, Pain, Increased muscle spasms, Hypomobility, Decreased range of motion  Visit Diagnosis: Pain in thoracic spine  Abnormal posture  Cramp and spasm  Muscle weakness (generalized)  Cervicalgia     Problem List Patient Active Problem List   Diagnosis Date Noted   Vitamin D deficiency 08/15/2020   Ataxia 11/23/2017   Dizzinesses 05/18/2017   Constipation 05/29/2016   Fatigue 08/30/2014   Generalized headaches 08/30/2014   Dyslipidemia 12/15/2013   Restless leg syndrome 12/15/2013   Anxiety 12/15/2013   Fibromyalgia with myofascial pain 11/16/2012   Insomnia 11/16/2012   Depression 11/16/2012   Ruben Im, PT 05/07/21 2:15 PM Phone: [phone] Fax: [phone]  Alvera Singh 05/07/2021, 2:13 PM  [facility] [address], [place], [place], [address] Phone: [phone]    Fax:  [phone]  Name: JANIQUE HROMADKA MRN: [id number] Date of Birth: 1946-03-05  [facility] [address], [place], [place], [address] Phone: [phone]   Fax:  [phone]  Physical Therapy Treatment  Patient Details  Name: OLEAN BASHER MRN: [id number] Date of Birth: 02/28/1946 Referring Provider (PT): Domingo Mend, MD   Encounter Date: 05/07/2021   PT End of Session - 05/07/21 1205     Visit Number 3    Date for PT Re-Evaluation 06/05/21    Authorization Type [employer] Medicare    Progress Note Due on Visit 10    PT Start Time 1145    PT Stop Time 1230    PT Time Calculation (min) 45 min    Activity Tolerance Patient tolerated treatment well             Past Medical History:  Diagnosis Date   Adenomatous colon polyp 1990   Allergy    Anxiety 12/15/2013   Arthritis    Cataract    Chronic constipation    Depression 11/16/2012   Diverticulitis    Diverticulosis    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  Dr Trudie Reed 2009 had a bx/MRI: was on MTX and Prednisone   External hemorrhoids    Fibromyalgia with myofascial pain 11/16/2012   Headaches, cluster    Hypercholesterolemia 12/15/2013   Hypertension    Insomnia 11/16/2012   Myalgia and myositis, unspecified    Other disorder of muscle, ligament, and fascia    eosinophilic fascitis   Other specified disease of hair and hair follicles    Pain in joint, multiple sites    Restless leg syndrome 12/15/2013   Unspecified sleep apnea    Urine incontinence     Past Surgical History:  Procedure Laterality Date   ABDOMINOPLASTY  2006   "tummy tuck"   [place]   left breast biopsy   CATARACT EXTRACTION, BILATERAL     MUSCLE BIOPSY  2009   DEEP TISSUE   REDUCTION MAMMAPLASTY     REFRACTIVE SURGERY     TONSILLECTOMY     TOTAL ABDOMINAL HYSTERECTOMY  1999   WISDOM TOOTH EXTRACTION      There were no vitals filed for this visit.   Subjective Assessment - 05/07/21 1150     Subjective  I only slept 1 1/2 hours.  I really need the DN.  I've used all kinds of rollers and massage sticks.  I massage these muscles with my fingers (SCM)  but I can't reach some of it.  My shoulders are all the way up to my ears sometimes.    Pertinent History anxiety, Parkinson's, HTN, widespread pain, Fibromyalgia, sleep apnea    Patient Stated Goals "feel normal again", reduce neck and thoracic pain    Currently in Pain? Yes    Pain Score 5     Pain Location Neck    Pain Type Chronic pain                               [facility] Adult PT Treatment/Exercise - 05/07/21 0001       Neck Exercises: Supine   Other Supine Exercise diaphragmatic breathing to deactivate Upper trap,SCM and scalene overuse; see pt instructions      Moist Heat Therapy   Number Minutes Moist Heat 3 Minutes    Moist Heat Location Cervical      Manual Therapy   Manual Therapy Soft tissue mobilization;Myofascial  [facility] [address], [place], [place], [address] Phone: [phone]   Fax:  [phone]  Physical Therapy Treatment  Patient Details  Name: OLEAN BASHER MRN: [id number] Date of Birth: 02/28/1946 Referring Provider (PT): Domingo Mend, MD   Encounter Date: 05/07/2021   PT End of Session - 05/07/21 1205     Visit Number 3    Date for PT Re-Evaluation 06/05/21    Authorization Type [employer] Medicare    Progress Note Due on Visit 10    PT Start Time 1145    PT Stop Time 1230    PT Time Calculation (min) 45 min    Activity Tolerance Patient tolerated treatment well             Past Medical History:  Diagnosis Date   Adenomatous colon polyp 1990   Allergy    Anxiety 12/15/2013   Arthritis    Cataract    Chronic constipation    Depression 11/16/2012   Diverticulitis    Diverticulosis    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  Dr Trudie Reed 2009 had a bx/MRI: was on MTX and Prednisone   External hemorrhoids    Fibromyalgia with myofascial pain 11/16/2012   Headaches, cluster    Hypercholesterolemia 12/15/2013   Hypertension    Insomnia 11/16/2012   Myalgia and myositis, unspecified    Other disorder of muscle, ligament, and fascia    eosinophilic fascitis   Other specified disease of hair and hair follicles    Pain in joint, multiple sites    Restless leg syndrome 12/15/2013   Unspecified sleep apnea    Urine incontinence     Past Surgical History:  Procedure Laterality Date   ABDOMINOPLASTY  2006   "tummy tuck"   [place]   left breast biopsy   CATARACT EXTRACTION, BILATERAL     MUSCLE BIOPSY  2009   DEEP TISSUE   REDUCTION MAMMAPLASTY     REFRACTIVE SURGERY     TONSILLECTOMY     TOTAL ABDOMINAL HYSTERECTOMY  1999   WISDOM TOOTH EXTRACTION      There were no vitals filed for this visit.   Subjective Assessment - 05/07/21 1150     Subjective  I only slept 1 1/2 hours.  I really need the DN.  I've used all kinds of rollers and massage sticks.  I massage these muscles with my fingers (SCM)  but I can't reach some of it.  My shoulders are all the way up to my ears sometimes.    Pertinent History anxiety, Parkinson's, HTN, widespread pain, Fibromyalgia, sleep apnea    Patient Stated Goals "feel normal again", reduce neck and thoracic pain    Currently in Pain? Yes    Pain Score 5     Pain Location Neck    Pain Type Chronic pain                               [facility] Adult PT Treatment/Exercise - 05/07/21 0001       Neck Exercises: Supine   Other Supine Exercise diaphragmatic breathing to deactivate Upper trap,SCM and scalene overuse; see pt instructions      Moist Heat Therapy   Number Minutes Moist Heat 3 Minutes    Moist Heat Location Cervical      Manual Therapy   Manual Therapy Soft tissue mobilization;Myofascial

## 2021-05-07 NOTE — Patient Instructions (Signed)
Stress Management and Relaxation Techniques There are two divisions in the nervous system that run many of our body's "behind the scenes" functions.  The "fight or flight" nervous system, and the "calming, rest and digest" nervous system.  These two systems have opposite effects on our body organs and systems and can impact our heart rate, blood pressure, breath rate, temperature, GI function, and experience of stress or calm.    Taking time to stimulate the "calming, rest and digest" part of our nervous system can help reduce experience of symptoms of chronic pain conditions, decrease stress and anxiety, and allow [place] to feel more equipped to handle challenges.  Below are strategies, techniques, and video suggestions to help stimulate this calming system.     Ways to Calm the Nervous System Yoga Meditation Mindfulness  Stretching Exercise Deep, slow breathing into belly (diaphragmatic breathing) with focused prolonged exhale Monotasking vs Multi-tasking (do one activity daily that is simple, focused, and slowly performed) Listen to your biorhythms: sleep when tired, rise when rested, eat when hungry, stop when full, etc Social connections - seek connections with others Laughter - laughing helps stimulate our "calming" nervous system Massage - by a practitioner or self-massage (example, feet for reflexology points) Singing or humming Cold exposure - try 30 sec of cold water at the end of your shower   Meditation, Yoga, Breathing, Stretching Video Suggestions [employer] YouTube Videos: Guided Meditation for Pelvic Floor Relaxation - [employer] YouTube video 10-Min Breath Meditation for Pelvic Health and Healing - [employer] YouTube video Pelvic Floor Release Stretches Bedtime Yoga for Pelvic Tension Pelvic Floor Release and Inner Thigh Stretch - Yoga for Pelvic Health (approx. 40 min) Progressive Muscle Relaxation Exercises - search [employer]gel [employer]quet exercise videos on  YouTube Focused relaxation through guided relaxation, breathing, and contractions/relaxations of various muscle groups Autogenic Relaxation Technique - search videos on YouTube Uses body's natural relaxation response through guided meditation, inducing heaviness in body, and verbal stimuli/affirmations to create overall feeling of well-being, slowed breathing, reduced heart rate, reduced blood pressure, reduced stress/anxiety Sympathetic Breathing Meditation - search videos on YouTube Regulate the nervous system and restore calm through focused breathing to stimulate the parasympathetic nervous system (the opposite of our "fight or flight" sympathetic nervous system) Mindfulness Meditation - search videos on YouTube Focuses on choosing to be present in the moment, finding enjoyment in the now Diaphragmatic Breathing - search videos on YouTube Guided Imagery for Relaxation - search videos on YouTube

## 2021-05-09 ENCOUNTER — Telehealth (HOSPITAL_COMMUNITY): Payer: Medicare HMO | Admitting: Psychiatry

## 2021-05-09 ENCOUNTER — Encounter: Payer: Medicare HMO | Admitting: Physical Therapy

## 2021-05-09 ENCOUNTER — Other Ambulatory Visit: Payer: Self-pay

## 2021-05-09 DIAGNOSIS — F4323 Adjustment disorder with mixed anxiety and depressed mood: Secondary | ICD-10-CM | POA: Diagnosis not present

## 2021-05-09 DIAGNOSIS — R69 Illness, unspecified: Secondary | ICD-10-CM | POA: Diagnosis not present

## 2021-05-09 MED ORDER — TRAZODONE HCL 100 MG PO TABS: ORAL_TABLET | ORAL | 5 refills | Status: DC

## 2021-05-09 MED ORDER — MIRTAZAPINE 30 MG PO TABS: 30.0000 mg | ORAL_TABLET | Freq: Every day | ORAL | 5 refills | Status: DC

## 2021-05-09 NOTE — Progress Notes (Signed)
Psychiatric Initial Adult Assessment   Patient Identification: Shannon Sutton MRN:  [id number] Date of Evaluation:  05/09/2021 Referral Source Dr. Alain Marion Chief Complaint:  Depression Visit Diagnosis major depression single episode in remission  History of Present Illness Today the patient and I had a long discussion of her medicines and her symptoms.  She is still bothered a great deal by an inner sense of tension.  However in other ways she is better.  She says the inner shaking sensation is somewhat better taking Klonopin.  It is not 100% better but most the time she is getting a benefit by taking Klonopin 0.5 mg 2 twice daily.  She now seems to be taking it as prescribed.  She also tried to take hydroxyzine but not clear about the alcohol.  Today he restated once again to try to take it and that it does not work she can repeated and if that does not work after the second time she can take it a third time.  These are all attempts to control her anxiety.  I do not think she is all that depressed.  The patient attempted to call in early for her trazodone despite the fact that she has plenty of refills.  Words were that she was just testing the system.  The patient takes Requip 4 times a day.  She spoke to another psychiatrist here who began her on Remeron 15 mg.  This is a reasonable intervention.  Then her neurologist again will Lexapro 10 mg.  I think it is clear that too many people are involved in her medications.  We will try to be consistent and asked for staff not to engage or involve other psychiatrist.  We will attempt to be frontline for her and for her to come back to see me in about 6 weeks.  The patient is not psychotic.  She drinks no alcohol and uses no drugs.  She lives alone.  She really has not had any falls.  I do not believe she is oversedated.  I think she is fatigued and exhausted by taking so many medications.  At this time we will try to reduce medications as best we can but I  think she really needs medications for anxiety.  I shared with her that I am willing to continue the Klonopin as prescribed.  I talked to her about the importance of not taking extra and that this is a controlled agent and if she was to misuse it I would not be able to prescribe it.  I think she understands that.  Depression Symptoms:  depressed mood, (Hypo) Manic Symptoms:   Anxiety Symptoms:   Psychotic Symptoms:   PTSD Symptoms:   Past Psychiatric History: Prozac, Effexor,Vybrid  Previous Psychotropic Medications: Yes   Substance Abuse History in the last 12 months:  No.  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  Dr Trudie Reed 2009 had a bx/MRI: was on MTX and Prednisone   External hemorrhoids    Fibromyalgia with myofascial pain 11/16/2012   Headaches, cluster    Hypercholesterolemia 12/15/2013   Hypertension    Insomnia 11/16/2012   Myalgia and myositis, unspecified  Other disorder of muscle, ligament, and fascia    eosinophilic fascitis   Other specified disease of hair and hair follicles    Pain in joint, multiple sites    Restless leg syndrome 12/15/2013   Unspecified sleep apnea    Urine incontinence     Past Surgical History:  Procedure Laterality Date   ABDOMINOPLASTY  2006   "tummy tuck"   [place]   left breast biopsy   CATARACT EXTRACTION, BILATERAL     MUSCLE BIOPSY  2009   DEEP TISSUE   REDUCTION MAMMAPLASTY     REFRACTIVE SURGERY     TONSILLECTOMY     TOTAL ABDOMINAL HYSTERECTOMY  1999   WISDOM TOOTH EXTRACTION      Family Psychiatric History:   Family History:  Family History  Problem Relation Age of Onset   Colon cancer Father 7    Heart disease Father    Colon polyps Mother    Heart disease Mother    Heart attack Mother    COPD Sister    Emphysema Sister    COPD Brother    Emphysema Brother    Lung cancer Brother    Breast cancer Paternal Grandmother    Heart disease Other        family history   Parkinson's disease Other    Heart disease Paternal Grandfather    Colon polyps Paternal Grandfather    Stomach cancer Neg Hx    Rectal cancer Neg Hx    Esophageal cancer Neg Hx     Social History:   Social History   Socioeconomic History   Marital status: Divorced    Spouse name: Not on file   Number of children: 2   Years of education: Not on file   Highest education level: Not on file  Occupational History   Occupation: Retired    Fish farm manager: RETIRED    Comment: mortgage Geophysicist/field seismologist  Tobacco Use   Smoking status: Former    Types: Cigarettes    Quit date: 12/19/1986    Years since quitting: 34.4   Smokeless tobacco: Never  Vaping Use   Vaping Use: Never used  Substance and Sexual Activity   Alcohol use: Yes    Alcohol/week: 1.0 standard drink    Types: 1 Glasses of wine per week    Comment: 2-3 x year   Drug use: No   Sexual activity: Yes    Birth control/protection: Surgical, Post-menopausal  Other Topics Concern   Not on file  Social History Narrative   Lives alone.  Retired.     Education- 2 years of college   2 daughters   caffeine daily 2-3 cups   Social Determinants of Health   Financial Resource Strain: Not on file  Food Insecurity: Not on file  Transportation Needs: Not on file  Physical Activity: Not on file  Stress: Not on file  Social Connections: Not on file    Additional Social History:   Allergies:   Allergies  Allergen Reactions   Crestor [Rosuvastatin Calcium]     abd pain   Cymbalta [Duloxetine Hcl]     Nausea    Effexor Xr [Venlafaxine Hcl Er]     dizziness   Morphine And Related Itching    agitation   Pravastatin     abd pain   Promethazine-Codeine  Itching    Itching in face   Rizatriptan Other (See Comments)    Tingling, prickly  sensation, feelings of palpitations, flushing.     Metabolic Disorder Labs: Lab Results  Component Value Date   HGBA1C 5.0 02/18/2019   No results found for: PROLACTIN Lab Results  Component Value Date   CHOL 171 06/20/2020   TRIG 65 06/20/2020   HDL 89 06/20/2020   CHOLHDL 1.9 06/20/2020   VLDL 11.4 03/18/2018   LDLCALC 69 06/20/2020   LDLCALC 84 05/16/2019     Current Medications: Current Outpatient Medications  Medication Sig Dispense Refill   atorvastatin (LIPITOR) 40 MG tablet Take 1 tablet (40 mg total) by mouth daily. 90 tablet 3   B Complex-C (SUPER B COMPLEX) TABS Take 1 tablet by mouth daily.      Carbidopa-Levodopa ER (SINEMET CR) 25-100 MG tablet controlled release Take 1 tablet by mouth in the morning, at noon, in the evening, and at bedtime. Slow release (Patient taking differently: Take by mouth. Slow release.  Take one and half tab four times daily) 360 tablet 4   clonazePAM (KLONOPIN) 0.5 MG tablet 2 qam & 2 at dinner 120 tablet 4   cyclobenzaprine (FLEXERIL) 5 MG tablet 2  qhs 60 tablet 4   hydrOXYzine (VISTARIL) 25 MG capsule 1  q day prn for  Anxiety and if not relieved after 1 hour may repeat 60 capsule 4   magnesium 30 MG tablet Take 30 mg by mouth 2 (two) times daily.      mirtazapine (REMERON) 30 MG tablet Take 1 tablet (30 mg total) by mouth at bedtime. 30 tablet 5   Multiple Vitamins-Minerals (MULTIVITAMIN WITH MINERALS) tablet Take 1 tablet by mouth daily.       propranolol (INDERAL) 40 MG tablet Take 40 mg by mouth 2 (two) times daily.     RESTASIS 0.05 % ophthalmic emulsion 1 drop 2 (two) times daily.     rOPINIRole (REQUIP) 1 MG tablet Take 1 mg by mouth. 4 times a day     traZODone (DESYREL) 100 MG tablet 2  qhs 60 tablet 5   No current facility-administered medications for this visit.    Neurologic: Headache: No Seizure:  No Paresthesias:No  Musculoskeletal: Strength & Muscle Tone: abnormal Gait & Station: unsteady Patient leans: N/A  Psychiatric Specialty Exam: ROS  There were no vitals taken for this visit.There is no height or weight on file to calculate BMI.  General Appearance: Fairly Groomed  Eye Contact:  Good  Speech:  Clear and Coherent  Volume:  Normal  Mood:  Depressed  Affect:  Appropriate  Thought Process:  Goal Directed  Orientation:  Full (Time, Place, and Person)  Thought Content:  WDL  Suicidal Thoughts:  No  Homicidal Thoughts:  No  Memory:  NA  Judgement:  Good  Insight:  Fair  Psychomotor Activity:  Normal  Concentration:    Recall:  [place] of Knowledge:Good  Language: Fair  Akathisia:  No  Handed:  Right  AIMS (if indicated):    Assets:  Desire for Improvement  ADL's:  Intact  Cognition: WNL  Sleep:      Treatment Plan Summary:  7/28/20223:55 PM    This patient's first problem is an adjustment disorder with an anxious mood state.  At this time she will continue taking Klonopin 0.5 mg 2 in the morning and 2 at night.  She will continue trying to take hydroxyzine on a as needed basis.  We will ask her to discontinue her Lexapro as I do not think it is appropriate or indicated.  We will ask her to continue taking her mirtazapine but to increase the dose to 30 mg.  This related to her second problem which is that of likely depression.  Her third problem is obviously insomnia.  She is sleeping somewhat better.  Instead of going to bed at 6:00 at night she goes to bed at 9 sleeps for a few hours gets up for a few hours and goes back to sleep for about 3.  In essence she is getting about 5 hours of sleep which still is not adequate.  Therefore makes good sense to increase the trazodone from 100 mg to a dose of 200 mg.  Patient was given the warning about getting up slowly.  This patient will return to see me in 6 weeks.

## 2021-05-14 ENCOUNTER — Ambulatory Visit (HOSPITAL_COMMUNITY): Payer: Medicare HMO | Admitting: Psychiatry

## 2021-05-14 ENCOUNTER — Ambulatory Visit: Payer: Medicare HMO

## 2021-05-14 DIAGNOSIS — G20 Parkinson's disease: Secondary | ICD-10-CM | POA: Diagnosis not present

## 2021-05-14 DIAGNOSIS — R1310 Dysphagia, unspecified: Secondary | ICD-10-CM | POA: Diagnosis not present

## 2021-05-14 DIAGNOSIS — I951 Orthostatic hypotension: Secondary | ICD-10-CM | POA: Diagnosis not present

## 2021-05-14 DIAGNOSIS — R1312 Dysphagia, oropharyngeal phase: Secondary | ICD-10-CM | POA: Diagnosis not present

## 2021-05-15 ENCOUNTER — Telehealth (HOSPITAL_COMMUNITY): Payer: Self-pay | Admitting: *Deleted

## 2021-05-15 NOTE — Telephone Encounter (Signed)
Pt called with c/o multiple s.e. from the increase in Remeron to 30 mg. Pt states that she has Goggled Remeron and that "I have every side effect" listed. Pt says that she is not taking medication tonight and would like a t/c. Pt has no future appointments scheduled. Please review.

## 2021-05-21 ENCOUNTER — Telehealth: Payer: Self-pay | Admitting: Diagnostic Neuroimaging

## 2021-05-21 ENCOUNTER — Ambulatory Visit: Payer: Medicare HMO | Attending: Internal Medicine

## 2021-05-21 ENCOUNTER — Other Ambulatory Visit: Payer: Self-pay

## 2021-05-21 DIAGNOSIS — M542 Cervicalgia: Secondary | ICD-10-CM | POA: Insufficient documentation

## 2021-05-21 DIAGNOSIS — R293 Abnormal posture: Secondary | ICD-10-CM | POA: Diagnosis not present

## 2021-05-21 DIAGNOSIS — R252 Cramp and spasm: Secondary | ICD-10-CM | POA: Diagnosis not present

## 2021-05-21 DIAGNOSIS — M6281 Muscle weakness (generalized): Secondary | ICD-10-CM | POA: Insufficient documentation

## 2021-05-21 DIAGNOSIS — M546 Pain in thoracic spine: Secondary | ICD-10-CM | POA: Insufficient documentation

## 2021-05-21 NOTE — Telephone Encounter (Signed)
I called the pt back and we had a lengthy conversation on this. Pt reports the MD she had seen at [facility] feels like her symptoms are coming from anxiety and not neccessary PD. Pt reports in years past she has struggled with internal tremors and the cloazepam has been helpful for this. She reports she is currently taking this med and is rx'd by Dr. Casimiro Needle.   I advised the pt if she would like to increase the clonazepam she would need to contact Dr. Casimiro Needle and discuss with him.  Pt was agreeable to this. If Dr. Casimiro Needle is not agreeable to an increase, then we will readdress.

## 2021-05-21 NOTE — Therapy (Addendum)
[facility] [address], [place], [place], [address] Phone: [phone]   Fax:  [phone]  Physical Therapy Treatment  Patient Details  Name: Shannon Sutton MRN: [id number] Date of Birth: 1946/01/27 Referring Provider (PT): Domingo Mend, MD   Encounter Date: 05/21/2021   PT End of Session - 05/21/21 1142     Visit Number 4    Date for PT Re-Evaluation 06/05/21    Authorization Type [employer]    Progress Note Due on Visit 10    PT Start Time 1103    PT Stop Time 1136    PT Time Calculation (min) 33 min    Activity Tolerance Treatment limited secondary to agitation    Behavior During Therapy [facility] for tasks assessed/performed             Past Medical History:  Diagnosis Date   Adenomatous colon polyp 1990   Allergy    Anxiety 12/15/2013   Arthritis    Cataract    Chronic constipation    Depression 11/16/2012   Diverticulitis    Diverticulosis    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  Dr Trudie Reed 2009 had a bx/MRI: was on MTX and Prednisone   External hemorrhoids    Fibromyalgia with myofascial pain 11/16/2012   Headaches, cluster    Hypercholesterolemia 12/15/2013   Hypertension    Insomnia 11/16/2012   Myalgia and myositis, unspecified    Other disorder of muscle, ligament, and fascia    eosinophilic fascitis   Other specified disease of hair and hair follicles    Pain in joint, multiple sites    Restless leg syndrome 12/15/2013   Unspecified sleep apnea    Urine incontinence     Past Surgical History:  Procedure Laterality Date   ABDOMINOPLASTY  2006   "tummy tuck"   [place]   left breast biopsy   CATARACT EXTRACTION, BILATERAL     MUSCLE BIOPSY  2009   DEEP TISSUE   REDUCTION MAMMAPLASTY     REFRACTIVE SURGERY     TONSILLECTOMY     TOTAL ABDOMINAL HYSTERECTOMY  1999   WISDOM TOOTH EXTRACTION      There were no vitals filed for  this visit.   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  I just need DN so that my shoulders will stay down.    Pertinent History anxiety, Parkinson's, HTN, widespread pain, Fibromyalgia, sleep apnea    Currently in Pain? Yes    Pain Score 0-No pain    Pain Location Neck    Pain Orientation Right;Left    Pain Descriptors / Indicators Tightness;Discomfort    Pain Type Chronic pain    Pain Onset More than a month ago    Pain Frequency Constant    Aggravating Factors  unknkown pattern, constant    Pain Relieving Factors deep breathing, pulling on the muscle                               [facility] Adult PT Treatment/Exercise - 05/21/21 0001       Manual Therapy   Manual Therapy Soft tissue mobilization;Myofascial release    Manual therapy comments bil upper traps, SCM and cervical paraspinals    Soft tissue mobilization skilled palpation and monitoring  2-3 days of relief, we will  cap at 5-6 sessions total,    PT Home Exercise Plan Access Code: [email or web address]    Recommended Other Services initial cert is signed             Patient will benefit from skilled therapeutic intervention in order to improve the following deficits and impairments:  Decreased activity tolerance, Decreased strength, Postural dysfunction, Impaired flexibility, Pain, Increased muscle spasms, Hypomobility, Decreased range of motion  Visit Diagnosis: Pain in thoracic spine  Cramp and spasm  Abnormal posture  Muscle weakness (generalized)  Cervicalgia     Problem List Patient Active Problem List   Diagnosis Date Noted   Vitamin D deficiency 08/15/2020   Ataxia 11/23/2017   Dizzinesses 05/18/2017   Constipation 05/29/2016   Fatigue 08/30/2014   Generalized headaches 08/30/2014   Dyslipidemia 12/15/2013   Restless leg syndrome 12/15/2013   Anxiety 12/15/2013   Fibromyalgia with myofascial pain 11/16/2012   Insomnia 11/16/2012   Depression 11/16/2012    Sigurd Sos, PT 05/21/21 11:43 AM  PHYSICAL THERAPY DISCHARGE SUMMARY  Visits from Start of Care: 4  Current functional level related to goals / functional outcomes: Pt called to cancel remaining appts and requested D/C.  No reason given.  See above for most current PT status.  Pt received 2-3 days of relief from DN.    Remaining deficits: Neck pain and headaches.  Tension and overuse of accessory musculature.  PT received education and HEP to address this.     Education / Equipment: HEP, diaphramatic breathing, posture.    Patient agrees to discharge. Patient goals were not met. Patient is being discharged due to the patient's request. Sigurd Sos, PT 05/22/21 9:07 AM   [facility] [address], [place], [place], [address] Phone: [phone]   Fax:  [phone]  Name: Shannon Sutton MRN: [id number] Date of Birth: [birth year]  [facility] [address], [place], [place], [address] Phone: [phone]   Fax:  [phone]  Physical Therapy Treatment  Patient Details  Name: Shannon Sutton MRN: [id number] Date of Birth: 1946/01/27 Referring Provider (PT): Domingo Mend, MD   Encounter Date: 05/21/2021   PT End of Session - 05/21/21 1142     Visit Number 4    Date for PT Re-Evaluation 06/05/21    Authorization Type [employer] Medicare    Progress Note Due on Visit 10    PT Start Time 1103    PT Stop Time 1136    PT Time Calculation (min) 33 min    Activity Tolerance Treatment limited secondary to agitation    Behavior During Therapy [facility] for tasks assessed/performed             Past Medical History:  Diagnosis Date   Adenomatous colon polyp 1990   Allergy    Anxiety 12/15/2013   Arthritis    Cataract    Chronic constipation    Depression 11/16/2012   Diverticulitis    Diverticulosis    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  Dr Trudie Reed 2009 had a bx/MRI: was on MTX and Prednisone   External hemorrhoids    Fibromyalgia with myofascial pain 11/16/2012   Headaches, cluster    Hypercholesterolemia 12/15/2013   Hypertension    Insomnia 11/16/2012   Myalgia and myositis, unspecified    Other disorder of muscle, ligament, and fascia    eosinophilic fascitis   Other specified disease of hair and hair follicles    Pain in joint, multiple sites    Restless leg syndrome 12/15/2013   Unspecified sleep apnea    Urine incontinence     Past Surgical History:  Procedure Laterality Date   ABDOMINOPLASTY  2006   "tummy tuck"   [place]   left breast biopsy   CATARACT EXTRACTION, BILATERAL     MUSCLE BIOPSY  2009   DEEP TISSUE   REDUCTION MAMMAPLASTY     REFRACTIVE SURGERY     TONSILLECTOMY     TOTAL ABDOMINAL HYSTERECTOMY  1999   WISDOM TOOTH EXTRACTION      There were no vitals filed for  this visit.   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  I just need DN so that my shoulders will stay down.    Pertinent History anxiety, Parkinson's, HTN, widespread pain, Fibromyalgia, sleep apnea    Currently in Pain? Yes    Pain Score 0-No pain    Pain Location Neck    Pain Orientation Right;Left    Pain Descriptors / Indicators Tightness;Discomfort    Pain Type Chronic pain    Pain Onset More than a month ago    Pain Frequency Constant    Aggravating Factors  unknkown pattern, constant    Pain Relieving Factors deep breathing, pulling on the muscle                               [facility] Adult PT Treatment/Exercise - 05/21/21 0001       Manual Therapy   Manual Therapy Soft tissue mobilization;Myofascial release    Manual therapy comments bil upper traps, SCM and cervical paraspinals    Soft tissue mobilization skilled palpation and monitoring

## 2021-05-21 NOTE — Telephone Encounter (Signed)
Pt called wanting to speak to the Provider regarding the physician she was referred to. Pt states that he informed her that what she is feeling is anxiety but the pt strongly believes that she has parkinson's internal tremor and she would like to discuss with provider or RN.

## 2021-05-23 ENCOUNTER — Encounter: Payer: Medicare HMO | Admitting: Physical Therapy

## 2021-05-28 ENCOUNTER — Encounter (HOSPITAL_COMMUNITY): Payer: Self-pay | Admitting: Psychiatry

## 2021-05-28 ENCOUNTER — Other Ambulatory Visit: Payer: Self-pay

## 2021-05-28 ENCOUNTER — Ambulatory Visit (HOSPITAL_COMMUNITY): Payer: Medicare HMO | Admitting: Psychiatry

## 2021-05-28 VITALS — BP 127/73 | HR 67 | Temp 98.2°F | Ht 61.0 in | Wt 133.0 lb

## 2021-05-28 DIAGNOSIS — R69 Illness, unspecified: Secondary | ICD-10-CM | POA: Diagnosis not present

## 2021-05-28 DIAGNOSIS — F4323 Adjustment disorder with mixed anxiety and depressed mood: Secondary | ICD-10-CM | POA: Diagnosis not present

## 2021-05-28 MED ORDER — CLONAZEPAM 0.5 MG PO TABS: ORAL_TABLET | ORAL | 4 refills | Status: DC

## 2021-05-28 MED ORDER — HYDROXYZINE PAMOATE 25 MG PO CAPS: ORAL_CAPSULE | ORAL | 4 refills | Status: DC

## 2021-05-28 NOTE — Progress Notes (Signed)
Psychiatric Initial Adult Assessment   Patient Identification: Shannon Sutton MRN:  [id number] Date of Evaluation:  05/28/2021 Referral Source Dr. Alain Marion Chief Complaint:  Depression Visit Diagnosis major depression single episode in remission  History of Present Illness  Today we had an extensive visit.  Once again I think there is a lot of confusion and issues with multiple psychotropic medications with this patient.  There is clearly no overt evidence of a Parkinson tremor.  Further there is no issues with balance.  The patient is walking normally she has very good posture and she talks about how flexible she is.  While I did not do a physical exam there is no evidence of significant parkinsonian symptoms.  The patient's volume of speaking is normal.  The patient's essential complaint is a sense of internal shaking and trembling.  It is not apparent by observations.  The patient is inconsistent about the benefits of Klonopin.  She takes only a mild dose of Klonopin 0.5 mg 2 in the morning and 2 at night.  She is not oversedated.  Her daughter helps her put out her medications.  The patient takes care of her own bills and finances.  The patient drives without a problem.  The patient was also told that she has restless legs and is on Requip 4 times daily.  The patient does not have any affect of being depressed at all.  He is very engaging and friendly spends a lot of time talking to our staff.  Recently we asked her to discontinue any Lexapro.  Recently asked her to increase her trazodone from 100 mg to 200 mg which she said actually helped her sleep.  The patient also has been getting acupuncture which she says helps her neck a great deal.  He also is in physical therapy.  Her daughter suspects and I agree that she is on too many medications.  She has made some accusations that she was testing to see if we would call in trazodone.  There is some degree of gain planning in this individual.  It is  difficult to identify a significant psychosocial stressor in this individual.  My hope is that with family I can do that.  Her only complaints are that his heebie-jeebies sensations which is inconsistent.  She really has no other physical complaints.  Depression Symptoms:  depressed mood, (Hypo) Manic Symptoms:   Anxiety Symptoms:   Psychotic Symptoms:   PTSD Symptoms:   Past Psychiatric History: Prozac, Effexor,Vybrid  Previous Psychotropic Medications: Yes   Substance Abuse History in the last 12 months:  No.  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  Dr Trudie Reed 2009 had a bx/MRI: was on MTX and Prednisone   External hemorrhoids    Fibromyalgia with myofascial pain 11/16/2012   Headaches, cluster    Hypercholesterolemia 12/15/2013   Hypertension    Insomnia 11/16/2012   Myalgia and myositis, unspecified    Other disorder of muscle, ligament, and fascia    eosinophilic fascitis   Other specified disease of hair and hair follicles    Pain in joint, multiple sites    Restless leg syndrome 12/15/2013   Unspecified sleep  apnea    Urine incontinence     Past Surgical History:  Procedure Laterality Date   ABDOMINOPLASTY  2006   "tummy tuck"   [place]   left breast biopsy   CATARACT EXTRACTION, BILATERAL     MUSCLE BIOPSY  2009   DEEP TISSUE   REDUCTION MAMMAPLASTY     REFRACTIVE SURGERY     TONSILLECTOMY     TOTAL ABDOMINAL HYSTERECTOMY  1999   WISDOM TOOTH EXTRACTION      Family Psychiatric History:   Family History:  Family History  Problem Relation Age of Onset   Colon cancer Father 72   Heart disease Father    Colon polyps Mother    Heart disease  Mother    Heart attack Mother    COPD Sister    Emphysema Sister    COPD Brother    Emphysema Brother    Lung cancer Brother    Breast cancer Paternal Grandmother    Heart disease Other        family history   Parkinson's disease Other    Heart disease Paternal Grandfather    Colon polyps Paternal Grandfather    Stomach cancer Neg Hx    Rectal cancer Neg Hx    Esophageal cancer Neg Hx     Social History:   Social History   Socioeconomic History   Marital status: Divorced    Spouse name: Not on file   Number of children: 2   Years of education: Not on file   Highest education level: Not on file  Occupational History   Occupation: Retired    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  Tobacco Use   Smoking status: Former    Types: Cigarettes    Quit date: 12/19/1986    Years since quitting: 34.4   Smokeless tobacco: Never  Vaping Use   Vaping Use: Never used  Substance and Sexual Activity   Alcohol use: Yes    Alcohol/week: 1.0 standard drink    Types: 1 Glasses of wine per week    Comment: 2-3 x year   Drug use: No   Sexual activity: Yes    Birth control/protection: Surgical, Post-menopausal  Other Topics Concern   Not on file  Social History Narrative   Lives alone.  Retired.     Education- 2 years of college   2 daughters   caffeine daily 2-3 cups   Social Determinants of Health   Financial Resource Strain: Not on file  Food Insecurity: Not on file  Transportation Needs: Not on file  Physical Activity: Not on file  Stress: Not on file  Social Connections: Not on file    Additional Social History:   Allergies:   Allergies  Allergen Reactions   Crestor [Rosuvastatin Calcium]     abd pain   Cymbalta [Duloxetine Hcl]     Nausea    Effexor Xr [Venlafaxine Hcl Er]     dizziness   Morphine And Related Itching    agitation   Pravastatin     abd pain   Promethazine-Codeine Itching    Itching in face   Rizatriptan Other (See Comments)     Tingling, prickly sensation, feelings of palpitations, flushing.     Metabolic Disorder Labs: Lab Results  Component Value Date   HGBA1C 5.0 02/18/2019   No results found for: PROLACTIN Lab Results  Component Value Date   CHOL 171 06/20/2020  TRIG 65 06/20/2020   HDL 89 06/20/2020   CHOLHDL 1.9 06/20/2020   VLDL 11.4 03/18/2018   LDLCALC 69 06/20/2020   LDLCALC 84 05/16/2019     Current Medications: Current Outpatient Medications  Medication Sig Dispense Refill   atorvastatin (LIPITOR) 40 MG tablet Take 1 tablet (40 mg total) by mouth daily. 90 tablet 3   B Complex-C (SUPER B COMPLEX) TABS Take 1 tablet by mouth daily.      Carbidopa-Levodopa ER (SINEMET CR) 25-100 MG tablet controlled release Take 1 tablet by mouth in the morning, at noon, in the evening, and at bedtime. Slow release (Patient taking differently: Take by mouth. Slow release.  Take one and half tab four times daily) 360 tablet 4   clonazePAM (KLONOPIN) 0.5 MG tablet 2 qam & 2 at dinner 120 tablet 4   cyclobenzaprine (FLEXERIL) 5 MG tablet 2  qhs 60 tablet 4   hydrOXYzine (VISTARIL) 25 MG capsule 1  tid 90 capsule 4   magnesium 30 MG tablet Take 30 mg by mouth 2 (two) times daily.      mirtazapine (REMERON) 30 MG tablet Take 1 tablet (30 mg total) by mouth at bedtime. 30 tablet 5   Multiple Vitamins-Minerals (MULTIVITAMIN WITH MINERALS) tablet Take 1 tablet by mouth daily.       propranolol (INDERAL) 40 MG tablet Take 40 mg by mouth 2 (two) times daily.     RESTASIS 0.05 % ophthalmic emulsion 1 drop 2 (two) times daily.     rOPINIRole (REQUIP) 1 MG tablet Take 1 mg by mouth. 4 times a day     traZODone (DESYREL) 100 MG tablet 2  qhs 60 tablet 5   No current facility-administered medications for this visit.    Neurologic: Headache: No Seizure: No Paresthesias:No  Musculoskeletal: Strength & Muscle Tone: abnormal Gait & Station: unsteady Patient leans: N/A  Psychiatric Specialty Exam: ROS  Blood  pressure 127/73, pulse 67, temperature 98.2 F (36.8 C), temperature source Oral, height '5\' 1"'$  (1.549 m), weight 133 lb (60.3 kg), SpO2 100 %.Body mass index is 25.13 kg/m.  General Appearance: Fairly Groomed  Eye Contact:  Good  Speech:  Clear and Coherent  Volume:  Normal  Mood:  Depressed  Affect:  Appropriate  Thought Process:  Goal Directed  Orientation:  Full (Time, Place, and Person)  Thought Content:  WDL  Suicidal Thoughts:  No  Homicidal Thoughts:  No  Memory:  NA  Judgement:  Good  Insight:  Fair  Psychomotor Activity:  Normal  Concentration:    Recall:  [place] of Knowledge:Good  Language: Fair  Akathisia:  No  Handed:  Right  AIMS (if indicated):    Assets:  Desire for Improvement  ADL's:  Intact  Cognition: WNL  Sleep:      Treatment Plan Summary:  8/16/20224:11 PM     This patient's first problem seems to be an adjustment disorder with an anxious mood state.  I am not clear exactly what the precipitant is other than being told that she has Parkinson's and according to a number of neurologists needs to be treated for it.  When I asked her about her response to Sinemet that she is on 4 times daily she says she does not remember any response.  In essence she says she does not think it is doing anything.  I think the same thing is the case with Requip for her so-called restless leg.  She says the restlessness is not in  her legs but actually all over her body.  She says it does respond to some Klonopin.  At this time I interventions are to continue the Klonopin 0.5 mg 2 in the morning and 2 at night.  At this time we will go ahead and increase her Vistaril from once a day to taking 25 mg 3 times daily.  Today I will ask her to reduce her Requip to a half of all milligram only at night.  She claims when she took it this way it worked well and she could not understand why the dose was increased.  Presently she takes 1 mg 4 times daily.  She will continue taking Sinemet  but reduced from 4 times daily down to 3 times daily.  I will make an attempt to contact her neurologist about these interventions.  On her next visit which will be in 6 weeks she should bring her daughter and to review all of our interventions and her assessment.  Patient is not suicidal.  She functions fairly well.

## 2021-05-29 ENCOUNTER — Ambulatory Visit: Payer: Medicare HMO | Admitting: Nurse Practitioner

## 2021-06-05 ENCOUNTER — Telehealth (HOSPITAL_COMMUNITY): Payer: Self-pay | Admitting: *Deleted

## 2021-06-05 NOTE — Telephone Encounter (Signed)
Writer spoke with pt, per Dr. Casimiro Needle, advising to resume taking Requip and Sinemet qid as Neurologist instructed. Pt verbalizes understanding.

## 2021-06-07 ENCOUNTER — Telehealth (HOSPITAL_COMMUNITY): Payer: Self-pay

## 2021-06-07 NOTE — Telephone Encounter (Signed)
Patient called stating that her shoulders are very tight and she's "screaming in pain." She stated that she needs a Cortisone Shot in her shoulder. Writer called her Neurologist, Dr. Linus Mako, and they stated that they do not do Cortisone Shots. Writer then called patient to give her that information and also to have her call her PCP to see if they'll do it. I also gave her the info for [facility]. Patient didn't pick up so writer LVM

## 2021-06-10 ENCOUNTER — Ambulatory Visit (INDEPENDENT_AMBULATORY_CARE_PROVIDER_SITE_OTHER): Payer: Medicare HMO | Admitting: Family Medicine

## 2021-06-10 ENCOUNTER — Other Ambulatory Visit: Payer: Self-pay

## 2021-06-10 ENCOUNTER — Encounter: Payer: Self-pay | Admitting: Family Medicine

## 2021-06-10 VITALS — BP 134/80 | HR 70 | Resp 16 | Ht 61.0 in | Wt 132.0 lb

## 2021-06-10 DIAGNOSIS — M542 Cervicalgia: Secondary | ICD-10-CM

## 2021-06-10 DIAGNOSIS — G4700 Insomnia, unspecified: Secondary | ICD-10-CM | POA: Diagnosis not present

## 2021-06-10 DIAGNOSIS — M797 Fibromyalgia: Secondary | ICD-10-CM

## 2021-06-10 MED ORDER — METHYLPREDNISOLONE ACETATE 40 MG/ML IJ SUSP
40.0000 mg | Freq: Once | INTRAMUSCULAR | Status: AC
  Administered 2021-06-10: 40 mg via INTRAMUSCULAR

## 2021-06-10 NOTE — Patient Instructions (Addendum)
A few things to remember from today's visit:  Cervicalgia  Insomnia, unspecified type  Today you received an injection of Depo Medrol 40 mg. I placed referral for ortho. Local icy hot and asper cream may help. Because risk of medication interaction we need to be careful with pain medications, so for now continue Tylenol 500 mg 4 times per day.  Caution with Flexeril.  In regard to insomnia, you can address this problem with your psychiatrist and PCP. Continue Trazodone and Mirtazapine.  Do not use My Chart to request refills or for acute issues that need immediate attention.   Please be sure medication list is accurate. If a new problem present, please set up appointment sooner than planned today.

## 2021-06-10 NOTE — Progress Notes (Signed)
Chief Complaint  Patient presents with   Shoulder Pain    Ongoing, right side is worse than left. Unable to sleep due to pain. 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   Neck Pain    Ongoing, right side is worse than left. Unable to sleep due to pain. 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   HPI: [name] is a 75 y.o. female with hx of fibromyalgia,eosinophilic fascitis, vit D def,depression,anxiety, and parkinsonis here today with above complaint. "My neck is killing me." Constant dull/achy pain, 9.5/10. Pain is radiated to both shoulders. States that she was"screaming" last night. She has not identified exacerbating factors. Topical icy hot sometimes help but after several applications.  Neck Pain  This is a chronic problem. The current episode started more than 1 year ago. The problem has been rapidly worsening. The pain is associated with an unknown factor. The pain is present in the anterior neck, left side and right side. The pain is at a severity of 10/10. The pain is severe. Nothing aggravates the symptoms. The pain is Same all the time. Stiffness is present All day. Pertinent negatives include no chest pain, fever, headaches, pain with swallowing, syncope, trouble swallowing, visual change or weight loss. She has tried acetaminophen, muscle relaxants, heat and ice for the symptoms. The treatment provided no relief.   Dry needling helped with pain but temporarily. She just completed PT. She is requesting a "cortisone shot",which helped with similar pain years ago. States that the plan was to perform surgery if not better. Her "arms are very week." Negative for numbness or tingling.  She takes Flexeril 5 mg, it does not help.  She is also c/o insomnia, states that melatonin is not helping. Requesting prescription for sleeping aid.  She follows with  psychiatrist. She takes Mirtazapine 30 mg at bedtime, Trazodone 100 mg daily,and Clonazepam 0.5 mg 2 tabs bid.  She also mentions that she is a "breath holder", problem has been going on for a while. When she feels like she is not breathing,she starts deep breathing exercises. She has not identified exacerbating factors.  Negative for cough, wheezing,or SOB.  Review of Systems  Constitutional:  Positive for fatigue. Negative for activity change, appetite change, fever and weight loss.  HENT:  Negative for trouble swallowing.   Cardiovascular:  Negative for chest pain, palpitations and syncope.  Gastrointestinal:  Negative for nausea and vomiting.  Endocrine: Negative for cold intolerance and heat intolerance.  Musculoskeletal:  Positive for back pain, gait problem and neck pain.  Skin:  Negative for pallor and rash.  Neurological:  Negative for syncope and headaches.  Psychiatric/Behavioral:  Positive for sleep disturbance. Negative for confusion. The patient is nervous/anxious.    Current Outpatient Medications on File Prior to Visit  Medication Sig Dispense Refill   atorvastatin (LIPITOR) 40 MG tablet Take 1 tablet (40 mg total) by mouth daily. 90 tablet 3   B Complex-C (SUPER B COMPLEX) TABS Take 1 tablet by mouth daily.      Carbidopa-Levodopa ER (SINEMET CR) 25-100 MG tablet controlled release Take 1 tablet by mouth in the morning, at noon, in the evening, and at bedtime. Slow release (Patient taking differently: Take by mouth. Slow release.  Take one and half tab four times daily) 360 tablet 4   clonazePAM (KLONOPIN) 0.5 MG tablet 2 qam &  2 at dinner 120 tablet 4   cyclobenzaprine (FLEXERIL) 5 MG tablet 2  qhs 60 tablet 4   hydrOXYzine (VISTARIL) 25 MG capsule 1  tid 90 capsule 4   magnesium 30 MG tablet Take 30 mg by mouth 2 (two) times daily.      mirtazapine (REMERON) 30 MG tablet Take 1 tablet (30 mg total) by mouth at bedtime. 30 tablet 5   Multiple Vitamins-Minerals  (MULTIVITAMIN WITH MINERALS) tablet Take 1 tablet by mouth daily.       propranolol (INDERAL) 40 MG tablet Take 40 mg by mouth 2 (two) times daily.     RESTASIS 0.05 % ophthalmic emulsion 1 drop 2 (two) times daily.     rOPINIRole (REQUIP) 1 MG tablet Take 1 mg by mouth. 4 times a day     traZODone (DESYREL) 100 MG tablet 2  qhs 60 tablet 5   No current facility-administered medications on file prior to visit.    Past Medical History:  Diagnosis Date   Adenomatous colon polyp 1990   Allergy    Anxiety 12/15/2013   Arthritis    Cataract    Chronic constipation    Depression 11/16/2012   Diverticulitis    Diverticulosis    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  Dr Trudie Reed 2009 had a bx/MRI: was on MTX and Prednisone   External hemorrhoids    Fibromyalgia with myofascial pain 11/16/2012   Headaches, cluster    Hypercholesterolemia 12/15/2013   Hypertension    Insomnia 11/16/2012   Myalgia and myositis, unspecified    Other disorder of muscle, ligament, and fascia    eosinophilic fascitis   Other specified disease of hair and hair follicles    Pain in joint, multiple sites    Restless leg syndrome 12/15/2013   Unspecified sleep apnea    Urine incontinence    Allergies  Allergen Reactions   Crestor [Rosuvastatin Calcium]     abd pain   Cymbalta [Duloxetine Hcl]     Nausea    Effexor Xr [Venlafaxine Hcl Er]     dizziness   Morphine And Related Itching    agitation   Pravastatin     abd pain   Promethazine-Codeine Itching    Itching in face   Rizatriptan Other (See Comments)    Tingling, prickly sensation, feelings of palpitations, flushing.     Social History   Socioeconomic History   Marital status: Divorced    Spouse name: Not on file   Number of children: 2   Years of education: Not on file   Highest education level: Not on file  Occupational History   Occupation: Retired    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  Tobacco Use   Smoking status:  Former    Types: Cigarettes    Quit date: 12/19/1986    Years since quitting: 34.5   Smokeless tobacco: Never  Vaping Use   Vaping Use: Never used  Substance and Sexual Activity   Alcohol use: Yes    Alcohol/week: 1.0 standard drink    Types: 1 Glasses of wine per week    Comment: 2-3 x year   Drug use: No   Sexual activity: Yes    Birth control/protection: Surgical, Post-menopausal  Other Topics Concern   Not on file  Social History Narrative   Lives alone.  Retired.     Education- 2 years of college   2 daughters   caffeine daily 2-3 cups   Social  Determinants of Health   Financial Resource Strain: Not on file  Food Insecurity: Not on file  Transportation Needs: Not on file  Physical Activity: Not on file  Stress: Not on file  Social Connections: Not on file   Vitals:   06/10/21 1026  BP: 134/80  Pulse: 70  Resp: 16  SpO2: 98%   Body mass index is 24.94 kg/m.  Physical Exam Vitals and nursing note reviewed.  Constitutional:      General: She is not in acute distress.    Appearance: She is well-developed.  HENT:     Head: Normocephalic and atraumatic.     Mouth/Throat:     Mouth: Mucous membranes are moist.     Pharynx: Oropharynx is clear.  Eyes:     Conjunctiva/sclera: Conjunctivae normal.  Cardiovascular:     Rate and Rhythm: Normal rate and regular rhythm.     Heart sounds: No murmur heard. Pulmonary:     Effort: Pulmonary effort is normal. No respiratory distress.     Breath sounds: Normal breath sounds.  Abdominal:     Palpations: Abdomen is soft.     Tenderness: There is no abdominal tenderness.  Musculoskeletal:     Right shoulder: No tenderness or bony tenderness. Normal range of motion.     Left shoulder: No tenderness or bony tenderness. Normal range of motion.     Cervical back: Tenderness present. No bony tenderness. Pain with movement (With rotation, R>L) present.       Back:  Lymphadenopathy:     Cervical: No cervical adenopathy.   Skin:    General: Skin is warm.     Findings: No erythema or rash.  Neurological:     Mental Status: She is alert and oriented to person, place, and time.     Cranial Nerves: No cranial nerve deficit.     Comments: Ataxic gait, not assisted.  Psychiatric:        Mood and Affect: Mood is anxious.   ASSESSMENT AND PLAN:  Ms.Ari was seen today for shoulder pain and neck pain.  Diagnoses and all orders for this visit: Orders Placed This Encounter  Procedures   Ambulatory referral to Orthopedic Surgery   Cervicalgia Chronic, getting worse. Insisting in getting same type of steroid injection she did years ago (?epidural),explained this cannot be done here. I did not find prior cervical studies. I do not think imaging is needed today,  After verbal consent, she receive Depo Medrol 40 mg IM x 1. Reporting mild improvement of pain after 10 min. Ortho referral placed. She is not interested in resuming PT.  -     methylPREDNISolone acetate (DEPO-MEDROL) injection 40 mg  Insomnia, unspecified type Problem is not well controlled. Insisting on getting medication for sleep. She is already on Mirtazapine,Trazodone,and Clonazepam. Recommend discussing problem with her psychiatrist or PCP.  Fibromyalgia with myofascial pain Duloxetine on allergic med list. Because risk of medication interaction and risk for side effects,she has no many options for pain management.  Lyrica or Gabapentin are some options, not sure if she has tried before. It seems like she has tried [place] in 2018. Caution with Flexeril, risk of med interaction with some of her other medications. She may benefit from pain management/clinic evaluation.  I spent a total of 40 minutes in both face to face and non face to face activities for this visit on the date of this encounter. During this time history was obtained and documented, examination was performed, some of prior  records  reviewed, and assessment/plan  discussed.   Return in about 2 weeks (around 06/24/2021) for with PCP.  Raymond Azure G. [place], MD  [facility]. [place] office.

## 2021-06-11 ENCOUNTER — Ambulatory Visit: Payer: Medicare HMO | Admitting: Family Medicine

## 2021-06-11 ENCOUNTER — Telehealth: Payer: Self-pay | Admitting: Internal Medicine

## 2021-06-11 NOTE — Telephone Encounter (Signed)
Patient called in to nurse line at 08/29/22at 3:43 pm.  She was triaged by nurse line.  Pt states she only sleep 1-2 hours per night.  She has Parkinsons, takes medication for it, and wants to know if she can take Melatonin with her other medications to help her sleep.  Was seen in the office for neck and back pain.

## 2021-06-11 NOTE — Telephone Encounter (Signed)
Patient is aware 

## 2021-06-12 ENCOUNTER — Other Ambulatory Visit (HOSPITAL_COMMUNITY): Payer: Self-pay | Admitting: *Deleted

## 2021-06-12 MED ORDER — BELSOMRA 10 MG PO TABS: 10.0000 mg | ORAL_TABLET | Freq: Every evening | ORAL | 0 refills | Status: DC

## 2021-06-13 ENCOUNTER — Other Ambulatory Visit (HOSPITAL_COMMUNITY): Payer: Self-pay | Admitting: *Deleted

## 2021-06-13 MED ORDER — TRAZODONE HCL 300 MG PO TABS: ORAL_TABLET | ORAL | 0 refills | Status: DC

## 2021-06-13 MED ORDER — TRAZODONE HCL 100 MG PO TABS: ORAL_TABLET | ORAL | 4 refills | Status: DC

## 2021-06-14 ENCOUNTER — Ambulatory Visit (INDEPENDENT_AMBULATORY_CARE_PROVIDER_SITE_OTHER): Payer: Medicare HMO | Admitting: Nurse Practitioner

## 2021-06-14 ENCOUNTER — Other Ambulatory Visit: Payer: Self-pay

## 2021-06-14 ENCOUNTER — Encounter: Payer: Self-pay | Admitting: Nurse Practitioner

## 2021-06-14 VITALS — BP 120/80 | HR 74 | Temp 97.5°F | Resp 16 | Ht 61.0 in | Wt 133.6 lb

## 2021-06-14 DIAGNOSIS — F419 Anxiety disorder, unspecified: Secondary | ICD-10-CM | POA: Diagnosis not present

## 2021-06-14 DIAGNOSIS — M797 Fibromyalgia: Secondary | ICD-10-CM

## 2021-06-14 DIAGNOSIS — G2581 Restless legs syndrome: Secondary | ICD-10-CM | POA: Diagnosis not present

## 2021-06-14 DIAGNOSIS — E782 Mixed hyperlipidemia: Secondary | ICD-10-CM

## 2021-06-14 DIAGNOSIS — R69 Illness, unspecified: Secondary | ICD-10-CM | POA: Diagnosis not present

## 2021-06-14 DIAGNOSIS — G20 Parkinson's disease: Secondary | ICD-10-CM | POA: Diagnosis not present

## 2021-06-14 DIAGNOSIS — F0280 Dementia in other diseases classified elsewhere without behavioral disturbance: Secondary | ICD-10-CM

## 2021-06-14 MED ORDER — DONEPEZIL HCL 5 MG PO TABS: 5.0000 mg | ORAL_TABLET | Freq: Every day | ORAL | Status: DC

## 2021-06-14 NOTE — Patient Instructions (Signed)
Increase physical activity daily  3 meals a day- protein supplement (in addition to meal- does not replace)  Take medication with food.

## 2021-06-14 NOTE — Progress Notes (Signed)
Careteam: Patient Care Team: Lauree Chandler, NP as PCP - General (Geriatric Medicine) Buford Dresser, MD as PCP - Cardiology (Cardiology) Tat, Eustace Quail, DO as Consulting Physician (Neurology) Delrae Rend, MD as Consulting Physician (Endocrinology) Loletha Carrow, Kirke Corin, MD as Consulting Physician (Gastroenterology) Dorothy Spark, MD (Inactive) as Consulting Physician (Cardiology) Georgeann Oppenheim, MD as Consulting Physician (Ophthalmology)  PLACE OF SERVICE:  [place] Directive information Does Patient Have a Medical Advance Directive?: Yes, Type of Advance Directive: [place];Living will;Out of facility DNR (pink MOST or yellow form), Does patient want to make changes to medical advance directive?: No - Patient declined  Allergies  Allergen Reactions   Crestor [Rosuvastatin Calcium]     abd pain   Cymbalta [Duloxetine Hcl]     Nausea    Effexor Xr [Venlafaxine Hcl Er]     dizziness   Morphine And Related Itching    agitation   Pravastatin     abd pain   Promethazine-Codeine Itching    Itching in face   Rizatriptan Other (See Comments)    Tingling, prickly sensation, feelings of palpitations, flushing.     Chief Complaint  Patient presents with   Establish Care    New Patient.      HPI: Patient is a 75 y.o. female to establish care   Hyperlipidemia- taking lipitor   Parkinson disease- follows with neurologist in [place]. On carbidopa-levodopa.   Reports she "shakes on the inside and take clonazepam 0.5 mg 2 in the morning and 2 at dinner. Started vistaril 25 mg was recently increased to TID bc she feels like she can not sit still. "Bubbles on the inside"   Dementia- started on aricept 5 mg in June.   Depression- on remeron 30 mg by mouth daily, ongoing, not controlled. Does not feel like she has anything to look fwd to. Previously on prozac. She has anger, frustration, temper and will shut down.  Also on trazodone  200 mg at bedtime to help with sleep, depression/anxiety She is being followed by psych.   Unsure why she is on propranolol, neurologist started her on.   RLS- on ropinirole 1 mg QID  Has had swallow study due to age and reports difficulty swallowing. Been working on lifestyle modifications.   Hx of orthostatic hypotension, been advised to wear compression hose. Saw specialist in the past  Review of Systems:  Review of Systems  Constitutional:  Positive for malaise/fatigue. Negative for chills, fever and weight loss.  HENT:  Negative for tinnitus.   Respiratory:  Negative for cough, sputum production and shortness of breath.   Cardiovascular:  Negative for chest pain, palpitations and leg swelling.  Gastrointestinal:  Negative for abdominal pain, constipation, diarrhea and heartburn.  Genitourinary:  Negative for dysuria, frequency and urgency.  Musculoskeletal:  Positive for myalgias and neck pain. Negative for back pain, falls and joint pain.  Skin: Negative.   Neurological:  Negative for dizziness and headaches.  Psychiatric/Behavioral:  Positive for depression, hallucinations and memory loss. The patient is nervous/anxious and has insomnia.    Past Medical History:  Diagnosis Date   Adenomatous colon polyp 1990   Allergy    Anxiety 12/15/2013   Arthritis    Cataract    Chronic constipation    Depression 11/16/2012   Diverticulitis    Diverticulosis    Eosinophilic fasciitis [id number]   Recurrent  Dr Trudie Reed 2009 had a bx/MRI: was on MTX and Prednisone   External  hemorrhoids    Fibromyalgia with myofascial pain 11/16/2012   Headaches, cluster    History of CT scan of head 2022   History of EKG 2021   History of mammogram 2022   History of MRI 2022   Brain   Hypercholesterolemia 12/15/2013   Hypertension    Insomnia 11/16/2012   Myalgia and myositis, unspecified    Other disorder of muscle, ligament, and fascia    eosinophilic fascitis   Other specified disease of  hair and hair follicles    Pain in joint, multiple sites    Parkinson's disease ([place])    Restless leg syndrome 12/15/2013   Unspecified sleep apnea    Urine incontinence    Past Surgical History:  Procedure Laterality Date   ABDOMINAL HYSTERECTOMY     ABDOMINOPLASTY  2006   "tummy tuck"   [place]   left breast biopsy   CATARACT EXTRACTION, BILATERAL     COLONOSCOPY  2018   MUSCLE BIOPSY  2009   DEEP TISSUE   REDUCTION MAMMAPLASTY     REFRACTIVE SURGERY     TONSILLECTOMY     TOTAL ABDOMINAL HYSTERECTOMY  1999   WISDOM TOOTH EXTRACTION     Social History:   reports that she quit smoking about 34 years ago. Her smoking use included cigarettes. She has never used smokeless tobacco. She reports that she does not currently use alcohol. She reports that she does not use drugs.  Family History  Problem Relation Age of Onset   Colon polyps Mother    Heart disease Mother    Heart attack Mother    Heart attack Father    Colon cancer Father 17   Heart disease Father    COPD Sister    Emphysema Sister    COPD Brother    Emphysema Brother    Lung cancer Brother    Breast cancer Paternal Grandmother    Heart disease Paternal Grandfather    Colon polyps Paternal Grandfather    Heart disease Other        family history   Parkinson's disease Other    Stomach cancer Neg Hx    Rectal cancer Neg Hx    Esophageal cancer Neg Hx     Medications: Patient's Medications  New Prescriptions   No medications on file  Previous Medications   ATORVASTATIN (LIPITOR) 40 MG TABLET    Take 1 tablet (40 mg total) by mouth daily.   CARBIDOPA-LEVODOPA (SINEMET IR) 25-100 MG TABLET    Take 1 tablet by mouth 4 (four) times daily.   CLONAZEPAM (KLONOPIN) 0.5 MG TABLET    2 qam & 2 at dinner   DONEPEZIL HCL PO    Take 1 tablet by mouth daily.   HYDROXYZINE (VISTARIL) 25 MG CAPSULE    1  tid   MIRTAZAPINE (REMERON) 30 MG TABLET    Take 1 tablet (30 mg  total) by mouth at bedtime.   MULTIPLE VITAMINS-MINERALS (MULTIVITAMIN WITH MINERALS) TABLET    Take 1 tablet by mouth as needed.   PROPRANOLOL (INDERAL) 40 MG TABLET    Take 40 mg by mouth 2 (two) times daily.   RESTASIS 0.05 % OPHTHALMIC EMULSION    1 drop 2 (two) times daily.   ROPINIROLE (REQUIP) 1 MG TABLET    Take 1 mg by mouth. 4 times a day   TRAZODONE (DESYREL) 100 MG TABLET    Take 200 mg by mouth at  bedtime.  Modified Medications   No medications on file  Discontinued Medications   B COMPLEX-C (SUPER B COMPLEX) TABS    Take 1 tablet by mouth as needed.   CARBIDOPA-LEVODOPA ER (SINEMET CR) 25-100 MG TABLET CONTROLLED RELEASE    Take 1 tablet by mouth in the morning, at noon, in the evening, and at bedtime. Slow release   CYCLOBENZAPRINE (FLEXERIL) 5 MG TABLET    2  qhs   MAGNESIUM 30 MG TABLET    Take 30 mg by mouth 2 (two) times daily.    TRAZODONE (DESYREL) 300 MG TABLET    Take 1 tablet by mouth (300 mg) right before bed each night.    Physical Exam:  Vitals:   06/14/21 1329  BP: 120/80  Pulse: 74  Resp: 16  Temp: (!) 97.5 F (36.4 C)  SpO2: 96%  Weight: 133 lb 9.6 oz (60.6 kg)  Height: '5\' 1"'$  (1.549 m)   Body mass index is 25.24 kg/m. Wt Readings from Last 3 Encounters:  06/14/21 133 lb 9.6 oz (60.6 kg)  06/10/21 132 lb (59.9 kg)  03/06/21 130 lb 14.4 oz (59.4 kg)    Physical Exam Constitutional:      General: She is not in acute distress.    Appearance: She is well-developed. She is not diaphoretic.  HENT:     Head: Normocephalic and atraumatic.     Mouth/Throat:     Pharynx: No oropharyngeal exudate.  Eyes:     Conjunctiva/sclera: Conjunctivae normal.     Pupils: Pupils are equal, round, and reactive to light.  Cardiovascular:     Rate and Rhythm: Normal rate and regular rhythm.     Heart sounds: Normal heart sounds.  Pulmonary:     Effort: Pulmonary effort is normal.     Breath sounds: Normal breath sounds.  Abdominal:     General: Bowel sounds  are normal.     Palpations: Abdomen is soft.  Musculoskeletal:     Cervical back: Normal range of motion and neck supple.     Right lower leg: No edema.     Left lower leg: No edema.  Skin:    General: Skin is warm and dry.  Neurological:     General: No focal deficit present.     Mental Status: She is alert and oriented to person, place, and time.     Gait: Gait is intact.  Psychiatric:        Attention and Perception: She is inattentive.        Mood and Affect: Mood is anxious.    Labs reviewed: Basic Metabolic Panel: Recent Labs    08/14/20 1618  NA 138  K 4.1  CL 101  CO2 27  GLUCOSE 84  BUN 10  CREATININE 0.75  CALCIUM 9.3  TSH 2.18   Liver Function Tests: Recent Labs    08/14/20 1618  AST 25  ALT 5*  BILITOT 0.7  PROT 6.5   No results for input(s): LIPASE, AMYLASE in the last 8760 hours. No results for input(s): AMMONIA in the last 8760 hours. CBC: Recent Labs    08/14/20 1618 11/01/20 1110  WBC 5.8 5.2  NEUTROABS 2,645 2.5  HGB 13.5 13.2  HCT 40.6 38.1  MCV 93.5 91.8  PLT 238 227.0   Lipid Panel: Recent Labs    06/20/20 1006  CHOL 171  HDL 89  LDLCALC 69  TRIG 65  CHOLHDL 1.9   TSH: Recent Labs    08/14/20 1618  TSH 2.18   A1C: Lab Results  Component Value Date   HGBA1C 5.0 02/18/2019     Assessment/Plan 1. Fibromyalgia with myofascial pain -reports chronic pain, she has intolerance to Cymbalta and effexor. May benefit from lyrica but would like to titrate some of the other medication she is on due to increase in fatigue.   2. Parkinson's disease ([place]) -followed by neurology, she is taking sinemet IR TID which controls symptoms.  3. Dementia due to Parkinson's disease without behavioral disturbance [facility]) -daughter with her at appt today to help due to memory loss.  - donepezil (ARICEPT) 5 MG tablet; Take 1 tablet (5 mg total) by mouth at bedtime.  4. Anxiety -pt with significant anxiety being followed by psych. She is on  multiple medications and daughter feels like she is being overmedicated. She does have increase in fatigue which could be related to her medication. She plans on talking to her psychiatrist about her regimen  5. Restless leg syndrome Ongoing, seems to be more than legs, she is on requip but unsure of benefit.   6. Mixed hyperlipidemia Continues on lipitor 40 mg daily    Next appt: 6 weeks.  Carlos American. [place], [facility] [phone]

## 2021-06-19 ENCOUNTER — Ambulatory Visit (HOSPITAL_BASED_OUTPATIENT_CLINIC_OR_DEPARTMENT_OTHER): Payer: Medicare HMO | Admitting: Cardiology

## 2021-06-19 ENCOUNTER — Encounter (HOSPITAL_BASED_OUTPATIENT_CLINIC_OR_DEPARTMENT_OTHER): Payer: Self-pay | Admitting: Cardiology

## 2021-06-19 ENCOUNTER — Other Ambulatory Visit: Payer: Self-pay

## 2021-06-19 VITALS — BP 100/60 | HR 74 | Ht 61.0 in | Wt 127.0 lb

## 2021-06-19 DIAGNOSIS — G20 Parkinson's disease: Secondary | ICD-10-CM

## 2021-06-19 DIAGNOSIS — Z9189 Other specified personal risk factors, not elsewhere classified: Secondary | ICD-10-CM

## 2021-06-19 DIAGNOSIS — I951 Orthostatic hypotension: Secondary | ICD-10-CM | POA: Diagnosis not present

## 2021-06-19 DIAGNOSIS — E7800 Pure hypercholesterolemia, unspecified: Secondary | ICD-10-CM | POA: Diagnosis not present

## 2021-06-19 DIAGNOSIS — Z7189 Other specified counseling: Secondary | ICD-10-CM | POA: Diagnosis not present

## 2021-06-19 MED ORDER — PROPRANOLOL HCL 40 MG PO TABS: 20.0000 mg | ORAL_TABLET | Freq: Two times a day (BID) | ORAL | Status: DC

## 2021-06-19 NOTE — Progress Notes (Signed)
Cardiology Office Note:    Date:  06/19/2021   ID:  Shannon Sutton, DOB 1946-07-07, MRN [id number]  PCP:  Shannon Seller, NP  Cardiologist:  Shannon Red, MD PhD  Referring MD: Shannon Sutton, Shannon Sutton*   CC: follow up  History of Present Illness:    Shannon Sutton is a 75 y.o. female with a hx of eosinophilic fasciitis, Parkinson's disease, depression who is seen in follow up for dizziness, fatigue. Her initial consult with me was on 05/20/18. Summary of that visit was related to symptoms that started in 02/2017; progressive fatigue, lightheadedness with exertion and changing position. No syncope. Happens every day. Lasts all day long. Very light sensitive. She does have a history of autoimmune disease (eosinophilic fascitis). This was prior to her Parkinson's diagnosis.  Today: Feels "terrible." Extreme fatigue. She was started on propranolol 40 mg BID (it appears in February of this year), but she is unsure why it was started. Has chronically low blood pressure, but feels she has felt worse in the last few months. Would like to stop the propranolol, but we discussed first cutting it back and then discussing with her neurologist whether she can stop completely.  Continues to have chronic dizziness.   Denies chest pain, shortness of breath at rest or with normal exertion. No PND, orthopnea, LE edema or unexpected weight gain. No syncope. Does feel like she has heart racing/palpitations after being active.    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   Recurrent  Shannon Sutton 2009 had a bx/MRI: was on MTX and Prednisone   External hemorrhoids    Fibromyalgia with myofascial pain 11/16/2012   Headaches, cluster    History of CT scan of head 2022   History of EKG 2021    History of mammogram 2022   History of MRI 2022   Brain   Hypercholesterolemia 12/15/2013   Hypertension    Insomnia 11/16/2012   Myalgia and myositis, unspecified    Other disorder of muscle, ligament, and fascia    eosinophilic fascitis   Other specified disease of hair and hair follicles    Pain in joint, multiple sites    Parkinson's disease (HCC)    Restless leg syndrome 12/15/2013   Unspecified sleep apnea    Urine incontinence     Past Surgical History:  Procedure Laterality Date   ABDOMINAL HYSTERECTOMY     ABDOMINOPLASTY  2006   "tummy tuck"   BREAST REDUCTION SURGERY  1995   BREAST SURGERY     CATARACT EXTRACTION, BILATERAL     COLONOSCOPY  2018   MUSCLE BIOPSY  2009   DEEP TISSUE   REFRACTIVE SURGERY     TONSILLECTOMY     TOTAL ABDOMINAL HYSTERECTOMY  1999   WISDOM TOOTH EXTRACTION      Current Medications: Current Outpatient Medications on File Prior to Visit  Medication Sig   atorvastatin (LIPITOR) 40 MG tablet Take 1 tablet (40 mg total) by mouth daily.   carbidopa-levodopa (SINEMET IR) 25-100 MG tablet Take 1 tablet by mouth 4 (four) times daily.   clonazePAM (KLONOPIN) 0.5 MG tablet 2 qam & 2 at dinner   donepezil (ARICEPT) 5 MG tablet Take 1 tablet (5 mg total) by mouth at  bedtime.   hydrOXYzine (VISTARIL) 25 MG capsule 1  tid (Patient taking differently: daily. 1  tid)   mirtazapine (REMERON) 30 MG tablet Take 1 tablet (30 mg total) by mouth at bedtime.   propranolol (INDERAL) 40 MG tablet Take 40 mg by mouth 2 (two) times daily.   RESTASIS 0.05 % ophthalmic emulsion 1 drop 2 (two) times daily.   rOPINIRole (REQUIP) 1 MG tablet Take 1 mg by mouth. 4 times a day   traZODone (DESYREL) 100 MG tablet Take 200 mg by mouth at bedtime.   No current facility-administered medications on file prior to visit.     Allergies:   Crestor [rosuvastatin calcium], Cymbalta [duloxetine hcl], Effexor xr [venlafaxine hcl er], Morphine and related, Pravastatin,  Promethazine-codeine, and Rizatriptan   Social History   Tobacco Use   Smoking status: Former    Types: Cigarettes    Quit date: 12/19/1986    Years since quitting: 34.5   Smokeless tobacco: Never  Vaping Use   Vaping Use: Never used  Substance Use Topics   Alcohol use: Not Currently   Drug use: No    Family History: The patient's family history includes Breast cancer in her paternal grandmother; COPD in her brother and sister; Colon cancer (age of onset: 73) in her father; Colon polyps in her mother and paternal grandfather; Emphysema in her brother and sister; Heart attack in her father and mother; Heart disease in her father, mother, paternal grandfather, and another family member; Lung cancer in her brother; Parkinson's disease in an other family member. There is no history of Stomach cancer, Rectal cancer, or Esophageal cancer.  ROS:   Please see the history of present illness.  Additional pertinent ROS otherwise unremarkable.  EKGs/Labs/Other Studies Reviewed:    The following studies were reviewed today: Recent PCP notes  EKG:  EKG is personally reviewed.  06/19/21: NSR at 74 bpm, low voltage, nonspecific t wave flattening  Recent Labs: 08/14/2020: ALT 5; BUN 10; Creat 0.75; Potassium 4.1; Sodium 138; TSH 2.18 11/01/2020: Hemoglobin 13.2; Platelets 227.0  Recent Lipid Panel    Component Value Date/Time   CHOL 171 06/20/2020 1006   TRIG 65 06/20/2020 1006   HDL 89 06/20/2020 1006   CHOLHDL 1.9 06/20/2020 1006   CHOLHDL 3 03/18/2018 0814   VLDL 11.4 03/18/2018 0814   LDLCALC 69 06/20/2020 1006    Physical Exam:    VS:  BP 100/60   Pulse 74   Ht 5\' 1"  (1.549 m)   Wt 127 lb (57.6 kg)   SpO2 97%   BMI 24.00 kg/m     Wt Readings from Last 3 Encounters:  06/19/21 127 lb (57.6 kg)  06/14/21 133 lb 9.6 oz (60.6 kg)  06/10/21 132 lb (59.9 kg)    GEN: Well nourished, well developed in no acute distress HEENT: Normal, moist mucous membranes NECK: No JVD CARDIAC:  regular rhythm, normal S1 and S2, no rubs or gallops. No murmur. VASCULAR: Radial and DP pulses 2+ bilaterally. No carotid bruits RESPIRATORY:  Clear to auscultation without rales, wheezing or rhonchi  ABDOMEN: Soft, non-tender, non-distended MUSCULOSKELETAL:  Ambulates independently SKIN: Warm and dry, no edema NEUROLOGIC:  Alert and oriented x 3. No focal neuro deficits noted. PSYCHIATRIC:  Normal affect    ASSESSMENT:    1. Parkinson disease (HCC)   2. Orthostatic hypotension   3. Hypercholesteremia   4. Cardiac risk counseling   5. Counseling on health promotion and disease prevention   6. At increased risk  for cardiovascular disease     PLAN:    History of orthostatic hypotension, with rare occasional hypertension:  -has been less labile recently, but feels very poorly on propranolol -we reviewed her recent neurology notes regarding management of her Parkinson's. Recommended decreasing her propranolol dose to 20 mg BID to see if this improves symptoms. Recommended she discuss with her neurologist if she wishes to discontinue completely, as she may need a taper -has been recommended for compression stockings. Discussed again today -no recent syncope, but feels terrible all the time  Hypercholesterolemia Elevated ASCVD risk  -LDL 144 in 02/2019 pre-statin -tolerating atorvastatin, denies symptoms -LDL 69 06/2020  CV risk counseling and primary prevention: -recommend heart healthy/Mediterranean diet, with whole grains, fruits, vegetable, fish, lean meats, nuts, and olive oil. Limit salt. -recommend moderate walking, 3-5 times/week for 30-50 minutes each session. Aim for at least 150 minutes.week. Goal should be pace of 3 miles/hours, or walking 1.5 miles in 30 minutes -recommend avoidance of tobacco products. Avoid excess alcohol. -ASCVD risk score: The 10-year ASCVD risk score Denman George DC Montez Hageman., et al., 2013) is: 30.6%   Values used to calculate the score:     Age: 22 years      Sex: Female     Is Non-Hispanic African American: No     Diabetic: Yes     Tobacco smoker: No     Systolic Blood Pressure: 100 mmHg     Is BP treated: Yes     HDL Cholesterol: 89 mg/dL     Total Cholesterol: 171 mg/dL   Plan for follow up: 1 year or sooner PRN  Medication Adjustments/Labs and Tests Ordered: Current medicines are reviewed at length with the patient today.  Concerns regarding medicines are outlined above.  No orders of the defined types were placed in this encounter.  Meds ordered this encounter  Medications   propranolol (INDERAL) 40 MG tablet    Sig: Take 0.5 tablets (20 mg total) by mouth 2 (two) times daily.     Patient Instructions  Medication Instructions:  Decrease Propranolol to 20 mg twice a day   *If you need a refill on your cardiac medications before your next appointment, please call your pharmacy*   Lab Work: None ordered today   Testing/Procedures: None ordered today   Follow-Up: At [facility] you and your health needs are our priority.  As part of our continuing mission to provide you with exceptional heart care, we have created designated Provider Care Teams.  These Care Teams include your primary Cardiologist (physician) and Advanced Practice Providers (APPs -  Physician Assistants and Nurse Practitioners) who all work together to provide you with the care you need, when you need it.  We recommend signing up for the patient portal called "MyChart".  Sign up information is provided on this After Visit Summary.  MyChart is used to connect with patients for Virtual Visits (Telemedicine).  Patients are able to view lab/test results, encounter notes, upcoming appointments, etc.  Non-urgent messages can be sent to your provider as well.   To learn more about what you can do with MyChart, go to [email or web address].    Your next appointment:   1 year(s)  The format for your next appointment:   In Person  Provider:   Jodelle Red, MD     Signed, Shannon Red, MD PhD 06/19/2021  [facility]

## 2021-06-19 NOTE — Patient Instructions (Signed)
Medication Instructions:  Decrease Propranolol to 20 mg twice a day   *If you need a refill on your cardiac medications before your next appointment, please call your pharmacy*   Lab Work: None ordered today   Testing/Procedures: None ordered today   Follow-Up: At [facility], you and your health needs are our priority.  As part of our continuing mission to provide you with exceptional heart care, we have created designated Provider Care Teams.  These Care Teams include your primary Cardiologist (physician) and Advanced Practice Providers (APPs -  Physician Assistants and Nurse Practitioners) who all work together to provide you with the care you need, when you need it.  We recommend signing up for the patient portal called "MyChart".  Sign up information is provided on this After Visit Summary.  MyChart is used to connect with patients for Virtual Visits (Telemedicine).  Patients are able to view lab/test results, encounter notes, upcoming appointments, etc.  Non-urgent messages can be sent to your provider as well.   To learn more about what you can do with MyChart, go to [email or web address].    Your next appointment:   1 year(s)  The format for your next appointment:   In Person  Provider:   Buford Dresser, MD

## 2021-06-20 ENCOUNTER — Ambulatory Visit (INDEPENDENT_AMBULATORY_CARE_PROVIDER_SITE_OTHER): Payer: Medicare HMO | Admitting: Internal Medicine

## 2021-06-20 ENCOUNTER — Encounter: Payer: Self-pay | Admitting: Internal Medicine

## 2021-06-20 ENCOUNTER — Telehealth (HOSPITAL_BASED_OUTPATIENT_CLINIC_OR_DEPARTMENT_OTHER): Payer: Self-pay | Admitting: Cardiology

## 2021-06-20 VITALS — BP 102/68 | HR 72 | Temp 98.1°F | Wt 132.6 lb

## 2021-06-20 DIAGNOSIS — M542 Cervicalgia: Secondary | ICD-10-CM

## 2021-06-20 MED ORDER — METHYLPREDNISOLONE ACETATE 40 MG/ML IJ SUSP
40.0000 mg | Freq: Once | INTRAMUSCULAR | Status: AC
  Administered 2021-06-20: 40 mg via INTRAMUSCULAR

## 2021-06-20 NOTE — Telephone Encounter (Signed)
New message"     Patient said she saw Dr Harrell Gave yesterday and her AVS seems to be wrong. She said there is a conflict on her sheet and what she was told about her next appointment,. Also something about her changing her  medicine and her blood pressure fo the office visit was not listed.

## 2021-06-20 NOTE — Telephone Encounter (Signed)
Attempted to contact pt.  Phone rang several times and then received message that VM has not been set up.  Unable to leave message.

## 2021-06-20 NOTE — Telephone Encounter (Signed)
Patient is returning call.  °

## 2021-06-20 NOTE — Telephone Encounter (Signed)
Returned call to patient, patient states that the information from yesterday's visit was not yet showing on her MyChart. Patient states that today all of the correct information is there. Patient has no other questions. Advised patient to call back to office with any issues, questions, or concerns. Patient verbalized understanding.

## 2021-06-20 NOTE — Progress Notes (Signed)
Acute office Visit     This visit occurred during the SARS-CoV-2 public health emergency.  Safety protocols were in place, including screening questions prior to the visit, additional usage of staff PPE, and extensive cleaning of exam room while observing appropriate contact time as indicated for disinfecting solutions.    CC/Reason for Visit: Discuss neck pain  HPI: Shannon Sutton is a 75 y.o. female who is coming in today for the above mentioned reasons. Past Medical History is significant for: Parkinson's disease who has had neck pain for some time.  This has been thought due to spasticity.  Pain is centralized around the right side of the neck and upper shoulder and upper back all of that same side.  Seems to follow the course of the trapezius muscle.   Past Medical/Surgical History: Past Medical History:  Diagnosis Date   Adenomatous colon polyp 1990   Allergy    Anxiety 12/15/2013   Arthritis    Cataract    Chronic constipation    Depression 11/16/2012   Diverticulitis    Diverticulosis    Eosinophilic fasciitis [id number]   Recurrent  Dr Trudie Reed 2009 had a bx/MRI: was on MTX and Prednisone   External hemorrhoids    Fibromyalgia with myofascial pain 11/16/2012   Headaches, cluster    History of CT scan of head 2022   History of EKG 2021   History of mammogram 2022   History of MRI 2022   Brain   Hypercholesterolemia 12/15/2013   Hypertension    Insomnia 11/16/2012   Myalgia and myositis, unspecified    Other disorder of muscle, ligament, and fascia    eosinophilic fascitis   Other specified disease of hair and hair follicles    Pain in joint, multiple sites    Parkinson's disease ([place])    Restless leg syndrome 12/15/2013   Unspecified sleep apnea    Urine incontinence     Past Surgical History:  Procedure Laterality Date   ABDOMINAL HYSTERECTOMY     ABDOMINOPLASTY  2006   "tummy tuck"   BREAST REDUCTION SURGERY  1995   BREAST SURGERY     CATARACT  EXTRACTION, BILATERAL     COLONOSCOPY  2018   MUSCLE BIOPSY  2009   DEEP TISSUE   REFRACTIVE SURGERY     TONSILLECTOMY     TOTAL ABDOMINAL HYSTERECTOMY  1999   WISDOM TOOTH EXTRACTION      Social History:  reports that she quit smoking about 34 years ago. Her smoking use included cigarettes. She has never used smokeless tobacco. She reports that she does not currently use alcohol. She reports that she does not use drugs.  Allergies: Allergies  Allergen Reactions   Crestor [Rosuvastatin Calcium]     abd pain   Cymbalta [Duloxetine Hcl]     Nausea    Effexor Xr [Venlafaxine Hcl Er]     dizziness   Morphine And Related Itching    agitation   Pravastatin     abd pain   Promethazine-Codeine Itching    Itching in face   Rizatriptan Other (See Comments)    Tingling, prickly sensation, feelings of palpitations, flushing.     Family History:  Family History  Problem Relation Age of Onset   Colon polyps Mother    Heart disease Mother    Heart attack Mother    Heart attack Father    Colon cancer Father 72   Heart disease Father    COPD Sister  Emphysema Sister    COPD Brother    Emphysema Brother    Lung cancer Brother    Breast cancer Paternal Grandmother    Heart disease Paternal Grandfather    Colon polyps Paternal Grandfather    Heart disease Other        family history   Parkinson's disease Other    Stomach cancer Neg Hx    Rectal cancer Neg Hx    Esophageal cancer Neg Hx      Current Outpatient Medications:    atorvastatin (LIPITOR) 40 MG tablet, Take 1 tablet (40 mg total) by mouth daily., Disp: 90 tablet, Rfl: 3   carbidopa-levodopa (SINEMET IR) 25-100 MG tablet, Take 1 tablet by mouth 4 (four) times daily., Disp: , Rfl:    clonazePAM (KLONOPIN) 0.5 MG tablet, 2 qam & 2 at dinner, Disp: 120 tablet, Rfl: 4   donepezil (ARICEPT) 5 MG tablet, Take 1 tablet (5 mg total) by mouth at bedtime., Disp: , Rfl:    hydrOXYzine (VISTARIL) 25 MG capsule, 1  tid  (Patient taking differently: daily. 1  tid), Disp: 90 capsule, Rfl: 4   mirtazapine (REMERON) 30 MG tablet, Take 1 tablet (30 mg total) by mouth at bedtime., Disp: 30 tablet, Rfl: 5   propranolol (INDERAL) 40 MG tablet, Take 0.5 tablets (20 mg total) by mouth 2 (two) times daily., Disp: , Rfl:    RESTASIS 0.05 % ophthalmic emulsion, 1 drop 2 (two) times daily., Disp: , Rfl:    rOPINIRole (REQUIP) 1 MG tablet, Take 1 mg by mouth. 4 times a day, Disp: , Rfl:    traZODone (DESYREL) 100 MG tablet, Take 200 mg by mouth at bedtime., Disp: , Rfl:   Current Facility-Administered Medications:    methylPREDNISolone acetate (DEPO-MEDROL) injection 40 mg, 40 mg, Intramuscular, Once, Isaac Bliss, Rayford Halsted, MD  Review of Systems:  Constitutional: Denies fever, chills, diaphoresis, appetite change.  HEENT: Denies photophobia, eye pain, redness, hearing loss, ear pain, congestion, sore throat, rhinorrhea, sneezing, mouth sores, trouble swallowing, neck pain, neck stiffness and tinnitus.   Respiratory: Denies SOB, DOE, cough, chest tightness,  and wheezing.   Cardiovascular: Denies chest pain, palpitations and leg swelling.  Gastrointestinal: Denies nausea, vomiting, abdominal pain, diarrhea, constipation, blood in stool and abdominal distention.  Genitourinary: Denies dysuria, urgency, frequency, hematuria, flank pain and difficulty urinating.  Endocrine: Denies: hot or cold intolerance, sweats, changes in hair or nails, polyuria, polydipsia. Musculoskeletal: Denies joint swelling, arthralgias and gait problem.  Skin: Denies pallor, rash and wound.  Neurological: Denies dizziness, seizures, syncope, weakness, light-headedness, numbness and headaches.  Hematological: Denies adenopathy. Easy bruising, personal or family bleeding history  Psychiatric/Behavioral: Denies suicidal ideation, mood changes, confusion, nervousness, sleep disturbance and agitation    Physical Exam: Vitals:   06/20/21 0930   BP: 102/68  Pulse: 72  Temp: 98.1 F (36.7 C)  TempSrc: Oral  SpO2: 96%  Weight: 132 lb 9.6 oz (60.1 kg)    Body mass index is 25.05 kg/m.   Constitutional: NAD, calm, comfortable Eyes: PERRL, lids and conjunctivae normal, wears corrective lenses ENMT: Mucous membranes are moist.  Psychiatric: Normal judgment and insight. Alert and oriented x 3. Normal mood.    Impression and Plan:  Neck pain on right side  - Plan: methylPREDNISolone acetate (DEPO-MEDROL) injection 40 mg to see if it provides some relief. -We have discussed heating alternating with ice, a weighted shoulder pad, and outpatient physical therapy.  Time spent: 31 minutes reviewing chart, interviewing patient and daughter,  examining patient and formulating plan of care.    Lelon Frohlich, MD [facility]

## 2021-06-24 ENCOUNTER — Telehealth: Payer: Self-pay | Admitting: Nurse Practitioner

## 2021-06-24 DIAGNOSIS — K5900 Constipation, unspecified: Secondary | ICD-10-CM | POA: Diagnosis not present

## 2021-06-24 DIAGNOSIS — R1033 Periumbilical pain: Secondary | ICD-10-CM | POA: Diagnosis not present

## 2021-06-24 NOTE — Telephone Encounter (Signed)
PT called to inquire about getting a apt with Dr.Hernandez. However she is no longer a patient of Dr.Hernandez. She became a PT of Dr.Eubanks and was not aware that upon doing that she was replacing Dr.Hernandez. Please advise if Dr.Hernandez will see her for her upset stomach.

## 2021-06-25 ENCOUNTER — Encounter (HOSPITAL_BASED_OUTPATIENT_CLINIC_OR_DEPARTMENT_OTHER): Payer: Self-pay | Admitting: Cardiology

## 2021-06-25 NOTE — Telephone Encounter (Signed)
Attempted to call patient, but voicemail is not set up yet.

## 2021-06-25 NOTE — Telephone Encounter (Signed)
Appointment scheduled.

## 2021-06-26 ENCOUNTER — Ambulatory Visit (INDEPENDENT_AMBULATORY_CARE_PROVIDER_SITE_OTHER): Payer: Medicare HMO | Admitting: Psychiatry

## 2021-06-26 ENCOUNTER — Other Ambulatory Visit: Payer: Self-pay

## 2021-06-26 DIAGNOSIS — R69 Illness, unspecified: Secondary | ICD-10-CM | POA: Diagnosis not present

## 2021-06-26 DIAGNOSIS — F4323 Adjustment disorder with mixed anxiety and depressed mood: Secondary | ICD-10-CM

## 2021-06-26 MED ORDER — ESZOPICLONE 3 MG PO TABS: 3.0000 mg | ORAL_TABLET | Freq: Every evening | ORAL | 3 refills | Status: DC | PRN

## 2021-06-26 MED ORDER — MIRTAZAPINE 15 MG PO TABS: 30.0000 mg | ORAL_TABLET | Freq: Every day | ORAL | 5 refills | Status: DC

## 2021-06-26 NOTE — Progress Notes (Signed)
Psychiatric Initial Adult Assessment   Patient Identification: Shannon Sutton MRN:  [id number] Date of Evaluation:  06/26/2021 Referral Source Dr. Alain Marion Chief Complaint:  Depression Visit Diagnosis major depression single episode in remission  History of Present Illness  Today the patient is seen with her daughter Shannon Sutton.  The patient is walking slowly.  She is wearing sunglasses.  She says she feels horrible.  She can identify feeling horrible means that she is not sleeping at night.  She therefore feels bad during the day and probably sleeping with a lack of energy.  She says she is limited and is able and therefore feels depressed about her situation.  There is some sense she has a reduction in her ability to enjoy things.  She used to go to the gym but no longer.  She used to shop but no longer.  Note is the patient starting in cognitive therapy for the next week.  I shared with the patient that I had an extensive discussion with her neurologist in [place].  He shared that much of her description of her symptoms were typical of Parkinson symptoms.  He clearly did not want to change any of her medicines and therefore she went back to her full dose of Sinemet and Requip.  I am not clear if she felt any different off of them.  The patient described what sounds like anxiety but actually describes internal jitteriness.  The patient is not on a neuroleptic.  Patient did not appear anxious.  She showed no signs of the movement problem.  Depression Symptoms:  depressed mood, (Hypo) Manic Symptoms:   Anxiety Symptoms:   Psychotic Symptoms:   PTSD Symptoms:   Past Psychiatric History: Prozac, Effexor,Vybrid  Previous Psychotropic Medications: Yes   Substance Abuse History in the last 12 months:  No.  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  Dr Trudie Reed 2009 had a bx/MRI: was on MTX and Prednisone   External hemorrhoids    Fibromyalgia with myofascial pain 11/16/2012   Headaches, cluster    History of CT scan of head 2022   History of EKG 2021   History of mammogram 2022   History of MRI 2022   Brain   Hypercholesterolemia 12/15/2013   Hypertension    Insomnia 11/16/2012   Myalgia and myositis, unspecified    Other disorder of muscle, ligament, and fascia    eosinophilic fascitis   Other specified disease of hair and hair follicles    Pain in joint, multiple sites    Parkinson's disease ([place])    Restless leg syndrome 12/15/2013   Unspecified sleep apnea    Urine incontinence     Past Surgical History:  Procedure Laterality Date   ABDOMINAL HYSTERECTOMY     ABDOMINOPLASTY  2006   "tummy tuck"   BREAST REDUCTION SURGERY  1995   BREAST SURGERY     CATARACT EXTRACTION, BILATERAL     COLONOSCOPY  2018   MUSCLE BIOPSY  2009   DEEP TISSUE   REFRACTIVE SURGERY     TONSILLECTOMY     TOTAL ABDOMINAL HYSTERECTOMY  1999   WISDOM TOOTH EXTRACTION      Family  Psychiatric History:   Family History:  Family History  Problem Relation Age of Onset   Colon polyps Mother    Heart disease Mother    Heart attack Mother    Heart attack Father    Colon cancer Father 68   Heart disease Father    COPD Sister    Emphysema Sister    COPD Brother    Emphysema Brother    Lung cancer Brother    Breast cancer Paternal Grandmother    Heart disease Paternal Grandfather    Colon polyps Paternal Grandfather    Heart disease Other        family history   Parkinson's disease Other    Stomach cancer Neg Hx    Rectal cancer Neg Hx    Esophageal cancer Neg Hx     Social History:   Social History   Socioeconomic History   Marital status: Divorced    Spouse name: Not on file    Number of children: 2   Years of education: Not on file   Highest education level: Not on file  Occupational History   Occupation: Retired    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  Tobacco Use   Smoking status: Former    Types: Cigarettes    Quit date: 12/19/1986    Years since quitting: 34.5   Smokeless tobacco: Never  Vaping Use   Vaping Use: Never used  Substance and Sexual Activity   Alcohol use: Not Currently   Drug use: No   Sexual activity: Yes    Birth control/protection: Surgical, Post-menopausal  Other Topics Concern   Not on file  Social History Narrative   Diet:      Caffeine: occasionally iced coffee      Married, if yes what year: Divorced      Do you live in a house, apartment, assisted living, condo, trailer, ect: House      Is it one or more stories: 1      How many persons live in your home? 1      Pets: Dog      Highest level or education completed: Some college      Current/Past profession: Patent examiner      Exercise:  No                Type and how often:          Living Will: Yes   DNR: No and would like to discuss   POA/HPOA: Yes      Functional Status:   Do you have difficulty bathing or dressing yourself? No   Do you have difficulty preparing food or eating? Yes, occasionally   Do you have difficulty managing your medications? Yes   Do you have difficulty managing your finances? Yes ( forget password and double pays   Do you have difficulty affording your medications? No   Social Determinants of Radio broadcast assistant Strain: Not on file  Food Insecurity: Not on file  Transportation Needs: Not on file  Physical Activity: Not on file  Stress: Not on file  Social Connections: Not on file    Additional Social History:   Allergies:   Allergies  Allergen Reactions   Crestor [Rosuvastatin Calcium]     abd pain   Cymbalta [Duloxetine Hcl]     Nausea    Effexor Xr [Venlafaxine Hcl Er]     dizziness    Morphine And Related Itching  agitation   Pravastatin     abd pain   Promethazine-Codeine Itching    Itching in face   Rizatriptan Other (See Comments)    Tingling, prickly sensation, feelings of palpitations, flushing.     Metabolic Disorder Labs: Lab Results  Component Value Date   HGBA1C 5.0 02/18/2019   No results found for: PROLACTIN Lab Results  Component Value Date   CHOL 171 06/20/2020   TRIG 65 06/20/2020   HDL 89 06/20/2020   CHOLHDL 1.9 06/20/2020   VLDL 11.4 03/18/2018   LDLCALC 69 06/20/2020   LDLCALC 84 05/16/2019     Current Medications: Current Outpatient Medications  Medication Sig Dispense Refill   eszopiclone 3 MG TABS Take 1 tablet (3 mg total) by mouth at bedtime as needed. Take immediately before bedtime 30 tablet 3   atorvastatin (LIPITOR) 40 MG tablet Take 1 tablet (40 mg total) by mouth daily. 90 tablet 3   carbidopa-levodopa (SINEMET IR) 25-100 MG tablet Take 1 tablet by mouth 4 (four) times daily.     clonazePAM (KLONOPIN) 0.5 MG tablet 2 qam & 2 at dinner 120 tablet 4   donepezil (ARICEPT) 5 MG tablet Take 1 tablet (5 mg total) by mouth at bedtime.     hydrOXYzine (VISTARIL) 25 MG capsule 1  tid (Patient taking differently: daily. 1  tid) 90 capsule 4   mirtazapine (REMERON) 15 MG tablet Take 2 tablets (30 mg total) by mouth at bedtime. 30 tablet 5   propranolol (INDERAL) 40 MG tablet Take 0.5 tablets (20 mg total) by mouth 2 (two) times daily.     RESTASIS 0.05 % ophthalmic emulsion 1 drop 2 (two) times daily.     rOPINIRole (REQUIP) 1 MG tablet Take 1 mg by mouth. 4 times a day     traZODone (DESYREL) 100 MG tablet Take 200 mg by mouth at bedtime.     No current facility-administered medications for this visit.    Neurologic: Headache: No Seizure: No Paresthesias:No  Musculoskeletal: Strength & Muscle Tone: abnormal Gait & Station: unsteady Patient leans: N/A  Psychiatric Specialty Exam: ROS  There were no vitals taken for  this visit.There is no height or weight on file to calculate BMI.  General Appearance: Fairly Groomed  Eye Contact:  Good  Speech:  Clear and Coherent  Volume:  Normal  Mood:  Depressed  Affect:  Appropriate  Thought Process:  Goal Directed  Orientation:  Full (Time, Place, and Person)  Thought Content:  WDL  Suicidal Thoughts:  No  Homicidal Thoughts:  No  Memory:  NA  Judgement:  Good  Insight:  Fair  Psychomotor Activity:  Normal  Concentration:    Recall:  [place] of Knowledge:Good  Language: Fair  Akathisia:  No  Handed:  Right  AIMS (if indicated):    Assets:  Desire for Improvement  ADL's:  Intact  Cognition: WNL  Sleep:      Treatment Plan Summary:  9/14/20224:28 PM    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  Today we are going to slightly reduce her Remeron from 30 mg down to 15 mg which I hope will help her sleep better.  Her second problem is that of insomnia.  Today we will begin her on Lunesta 3 mg.  We will ask her to discontinue any of her trazodone.  She got up to 200 mg with no real benefit.  The patient has an adjustment disorder with an  anxious mood state and takes Klonopin twice a day.  We will continue her Klonopin, add Lunesta and reduce her Remeron.  We will hope that she we will enter into a one-to-one therapy as planned.  The patient was given a return appointment to see me in 6 weeks.  I shared with her that her internal discomfort according to her neurologist is unrelated to a specific psychiatric condition and in fact is somehow related to her Parkinson symptoms.  The patient and her daughter made a decision not to return to [facility] to go see a neurologist here in [place].

## 2021-06-26 NOTE — Addendum Note (Signed)
Addended by: Patria Mane A on: 06/26/2021 03:19 PM   Modules accepted: Orders

## 2021-06-27 ENCOUNTER — Telehealth: Payer: Self-pay | Admitting: Gastroenterology

## 2021-06-27 ENCOUNTER — Telehealth (HOSPITAL_COMMUNITY): Payer: Self-pay | Admitting: *Deleted

## 2021-06-27 ENCOUNTER — Other Ambulatory Visit (HOSPITAL_COMMUNITY): Payer: Self-pay | Admitting: *Deleted

## 2021-06-27 MED ORDER — MIRTAZAPINE 15 MG PO TABS: 30.0000 mg | ORAL_TABLET | Freq: Every day | ORAL | 5 refills | Status: DC

## 2021-06-27 NOTE — Telephone Encounter (Signed)
PA for Lunesta '3mg'$  submitted via CoverMyMeds and approved from 10/13/20 through 10/12/21. PA case # [id number].  PA also submitted for Mirtazapine 15 mg (30 mg total dose) and approved. PA Case [id number]. Approved from 10/13/20 through 10/12/21. Pt notified.

## 2021-06-27 NOTE — Telephone Encounter (Signed)
Attempted to reach patient twice, her phone goes straight to voicemail. Unable to leave a voicemail at this time.

## 2021-06-28 ENCOUNTER — Ambulatory Visit: Payer: Medicare HMO | Admitting: Internal Medicine

## 2021-06-29 ENCOUNTER — Telehealth: Payer: Self-pay | Admitting: Nurse Practitioner

## 2021-06-29 NOTE — Telephone Encounter (Signed)
Patient is a 75 yo female with Parkinson's disease, fibromyalgia, anxiety, chronic constipation . She has not been seen in our office since time of colonoscopy in 2018.  She called the answering service today with a several week history of upper abdominal pain and severe nausea without vomiting.  She also has chronic constipation but pain not relieved after having a bowel movement with prune juice.  She takes a daily aspirin, no other NSAIDs.  She is not having any GI bleeding. She has had these symptoms for weeks but someone told her her she may have an ulcer and so she decided to call.   I told the patient that we needed to see her in the office for further evaluation.  I will ask my nurse to call her on Monday or Tuesday to arrange for an office follow-up with Dr. Loletha Carrow or one of the [facility].  In the interim recommend she start Prilosec OTC 30 minutes before breakfast..

## 2021-07-01 NOTE — Telephone Encounter (Signed)
Spoke with the patient. Reviewed the recommendations. She understands the recommendation and she asks that an appointment be scheduled with her provider as well. She has started taking OTC Prevacid. States it has helped though she is not 100% better. Appointment 07/15/21 at 1:20 pm. To arrive at 1:05 pm.

## 2021-07-01 NOTE — Telephone Encounter (Signed)
Patient's concerns have been addressed. See 06/29/21 telephone encounter.

## 2021-07-01 NOTE — Telephone Encounter (Signed)
Patient states recommendation to drink prune juice is making her sick

## 2021-07-01 NOTE — Telephone Encounter (Addendum)
Beth,  Thank you for the note.  I am out of the office until 9/20 and checking messages. I have procedures 9/20 through 9/22 and no open office slots 9/23  If this patient cannot be seen by an APP 9/19 or 9/20, then she should proceed to the ED (main hospital or med center [facility])  - HD

## 2021-07-02 ENCOUNTER — Telehealth: Payer: Self-pay | Admitting: Nurse Practitioner

## 2021-07-02 NOTE — Telephone Encounter (Signed)
Closing encounter as this has been addressed in a previously created encounter

## 2021-07-02 NOTE — Telephone Encounter (Signed)
Spoke with patient. Per patient she has not had a stool in 2 weeks. Pt states she had a "blow out" and then has no longer been able to stool now. Patient also stated she thought the Prune juice was making her sick, however she feels better now with also taking prevacid.Patient says she has the urge to use the bathroom but hasn't and she wonders if this could be due to her hemorrhoids. Denies any blood in her stool.  Please Advise, thank you

## 2021-07-02 NOTE — Telephone Encounter (Signed)
Inbound call from pt requesting a call back stating that she talk with you earlier but forgot to let you know something. Please advise. Thank you.

## 2021-07-02 NOTE — Telephone Encounter (Signed)
After review of conversation below, called pt to strongly encourage her to proceed to the ED for further evaluation and treatment for possible bowel obstruction. Pt states, "well I haven't been able to drink my prune juice and a can only pass small drops of stool". Again emphasized she proceed to the ED for bowel obstruction. Educated that since she is only passing "drops of stool", unable to drink fluids without becoming ill, having abd discomfort are all strong suggestions that she has stool that cannot pass. Educated that the longer the stool sits in her colon, the higher the risk she has for a bowel obstruction resulting in possible perforation of her bowel. Pt expressed complete understanding of education and recommendation and assured me she would proceed to nearest ED for eval and tx't.

## 2021-07-02 NOTE — Telephone Encounter (Signed)
This patient was last seen in our office for procedure in May 2018.  I understand that she is not feeling well, but I do not yet know what is causing this and do not have an appointment available to see her immediately, making it difficult to give further recommendations.  Therefore I reiterate my concerns that she could have an obstruction and advice from yesterday's phone note that she seek care in the ED.

## 2021-07-03 NOTE — Telephone Encounter (Signed)
Thank you for the note, I agree with the advice she gave her.  I have no further recommendations until she can be seen in person in the office.  -HD

## 2021-07-03 NOTE — Telephone Encounter (Signed)
Returned pt call. Advised that it is not uncommon to experience abdominal pain and cramping after defecation, especially if constipated and taking medications to aid with defecation. Asked if she was having any rectal bleeding, blood in her stool, nausea/vomiting, inability to pass gas or stool. Pt states she is now passing gas and having a normal bowel movement, although "feeling crampy". Denies N/V or rectal bleeding/blood in stool. Given her h/o chronic constipation, strongly encouraged that she take a stool softener daily, take 1-2 doses of Miralax daily and increase her fluid intake to at least 64 ounces or more per day. Advised she keep her f/u appt as scheduled with Dr. Loletha Carrow so that he can provide further recommendations. If her symptoms recur, advised she proceed to the Ed for further eval. Verbalized acceptance and understanding.  Routing to Dr. Loletha Carrow as Juluis Rainier

## 2021-07-03 NOTE — Telephone Encounter (Signed)
Patient called in. 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 But later in the night the prune juice kicked in and emptied her out. She says her stomach is still hurting

## 2021-07-07 ENCOUNTER — Telehealth: Payer: Self-pay | Admitting: Internal Medicine

## 2021-07-07 NOTE — Telephone Encounter (Signed)
Patient with a history of Parkinson's fibromyalgia and memory disturbance on Aricept who is calling with questions about constipation.  She reports that she took "a whole lot of prune juice" recently (records reviewed) and had a blowout and emptied out.  Has not really moved her bowels since then.  Feels better with passage of flatus which she is doing.  She is taking some MiraLAX but has some mild lower quadrant abdominal pain and nausea.  She is concerned about what to do to defecate.  She feels like she needs to defecate but is unable to.  I have recommended that she resume prune juice 1 or 2 glasses a day to try to promote defecation while she is waiting for her pending October 3 follow-up with Dr. Loletha Carrow.  Additional recommendation was to try glycerin suppository to stimulate defecation.  I would like to note that she had absolutely no recollection of a September 12 urgent care visit that she had regarding constipation.

## 2021-07-08 NOTE — Telephone Encounter (Signed)
Thank you, Glendell Docker.  I agree with your advice and that the memory difficulty is making this evaluation and management more challenging.  - HD

## 2021-07-10 ENCOUNTER — Ambulatory Visit (HOSPITAL_COMMUNITY): Payer: Medicare HMO | Admitting: Psychiatry

## 2021-07-12 ENCOUNTER — Ambulatory Visit: Payer: Medicare HMO | Admitting: Orthopaedic Surgery

## 2021-07-15 ENCOUNTER — Encounter: Payer: Self-pay | Admitting: Gastroenterology

## 2021-07-15 ENCOUNTER — Ambulatory Visit (INDEPENDENT_AMBULATORY_CARE_PROVIDER_SITE_OTHER): Payer: Medicare HMO | Admitting: Gastroenterology

## 2021-07-15 ENCOUNTER — Other Ambulatory Visit (INDEPENDENT_AMBULATORY_CARE_PROVIDER_SITE_OTHER): Payer: Medicare HMO

## 2021-07-15 VITALS — BP 124/80 | HR 96 | Ht 60.25 in | Wt 131.1 lb

## 2021-07-15 DIAGNOSIS — R110 Nausea: Secondary | ICD-10-CM

## 2021-07-15 DIAGNOSIS — K5909 Other constipation: Secondary | ICD-10-CM | POA: Diagnosis not present

## 2021-07-15 DIAGNOSIS — R1084 Generalized abdominal pain: Secondary | ICD-10-CM | POA: Diagnosis not present

## 2021-07-15 LAB — CBC WITH DIFFERENTIAL/PLATELET
Basophils Absolute: 0.1 10*3/uL (ref 0.0–0.1)
Basophils Relative: 1.1 % (ref 0.0–3.0)
Eosinophils Absolute: 0.1 10*3/uL (ref 0.0–0.7)
Eosinophils Relative: 2.1 % (ref 0.0–5.0)
HCT: 40 % (ref 36.0–46.0)
Hemoglobin: 13.8 g/dL (ref 12.0–15.0)
Lymphocytes Relative: 32.4 % (ref 12.0–46.0)
Lymphs Abs: 1.8 10*3/uL (ref 0.7–4.0)
MCHC: 34.6 g/dL (ref 30.0–36.0)
MCV: 92 fl (ref 78.0–100.0)
Monocytes Absolute: 0.4 10*3/uL (ref 0.1–1.0)
Monocytes Relative: 7.8 % (ref 3.0–12.0)
Neutro Abs: 3.1 10*3/uL (ref 1.4–7.7)
Neutrophils Relative %: 56.6 % (ref 43.0–77.0)
Platelets: 226 10*3/uL (ref 150.0–400.0)
RBC: 4.35 Mil/uL (ref 3.87–5.11)
RDW: 13 % (ref 11.5–15.5)
WBC: 5.4 10*3/uL (ref 4.0–10.5)

## 2021-07-15 LAB — COMPREHENSIVE METABOLIC PANEL
ALT: 4 U/L (ref 0–35)
AST: 16 U/L (ref 0–37)
Albumin: 4.6 g/dL (ref 3.5–5.2)
Alkaline Phosphatase: 74 U/L (ref 39–117)
BUN: 9 mg/dL (ref 6–23)
CO2: 29 mEq/L (ref 19–32)
Calcium: 9.6 mg/dL (ref 8.4–10.5)
Chloride: 103 mEq/L (ref 96–112)
Creatinine, Ser: 0.71 mg/dL (ref 0.40–1.20)
GFR: 81.98 mL/min (ref >60.00)
Glucose, Bld: 118 mg/dL — ABNORMAL HIGH (ref 70–99)
Potassium: 4.4 mEq/L (ref 3.5–5.1)
Sodium: 140 mEq/L (ref 135–145)
Total Bilirubin: 0.5 mg/dL (ref 0.2–1.2)
Total Protein: 7 g/dL (ref 6.0–8.3)

## 2021-07-15 MED ORDER — ONDANSETRON HCL 4 MG PO TABS: 4.0000 mg | ORAL_TABLET | Freq: Three times a day (TID) | ORAL | 1 refills | Status: DC | PRN

## 2021-07-15 MED ORDER — PEG 3350-KCL-NA BICARB-NACL 420 G PO SOLR: 4000.0000 mL | Freq: Once | ORAL | 0 refills | Status: AC

## 2021-07-15 NOTE — Progress Notes (Signed)
[facility] Consult Note:  History: Shannon Sutton 07/15/2021  Referring provider: Isaac Bliss, Rayford Halsted, MD  Reason for consult/chief complaint: Abdominal Pain (Epigastric/upper abd pains), Nausea (Really bad nausea, "I feel like if I can throw up I will feel better"), Constipation (1 BM every 1 1/2 weeks, a small amount yesterday but have to take medication and enema's), and Hemorrhoids   Subjective  HPI:  Shannon Sutton was seen for abdominal pain nausea and constipation after some recent phone calls to the office. History is somewhat difficult to obtain due to her memory difficulty, and she was unclear on the timing and content of phone calls to our office recently.  She called [place] about 2 weeks back with abdominal pain and intermittent, nausea and constipation.  She has chronic constipation, but says it is worse in the last few months.  She has had upper abdominal pain that seems nonradiating with no clear triggers or relieving factors.  She feels as if she could just belch she would get better.  She had been taking prune juice and perhaps some other OTC remedies for the constipation.  She had at first told us that the prune juice was not working, now tells me it works better than anything else.  She might go 7 to 10 days with only small pellet-like bowel movements.  This is caused some hemorrhoids to flareup as well with pain and occasional blood on the paper.  Last colonoscopy May 2018 for history of polyps and family history of colon cancer in father.  Diverticulosis seen, no polyps.  Recommendation was consideration of repeat colonoscopy in 5 years if overall health allowed.  No new medicines that she can recall prior to the onset of this abdominal pain and worsen constipation. ROS:  Review of Systems  Constitutional:  Positive for fatigue. Negative for appetite change and unexpected weight change.  HENT:  Negative for mouth sores and voice change.   Eyes:   Negative for pain and redness.  Respiratory:  Negative for cough and shortness of breath.   Cardiovascular:  Negative for chest pain and palpitations.  Genitourinary:  Negative for dysuria and hematuria.  Musculoskeletal:  Positive for arthralgias. Negative for myalgias.  Skin:  Negative for pallor and rash.  Neurological:  Positive for tremors. Negative for weakness and headaches.  Hematological:  Negative for adenopathy.    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  Dr Trudie Reed 2009 had a bx/MRI: was on MTX and Prednisone   External hemorrhoids    Fibromyalgia with myofascial pain 11/16/2012   Headaches, cluster    History of CT scan of head 2022   History of EKG 2021   History of mammogram 2022   History of MRI 2022   Brain   Hypercholesterolemia 12/15/2013   Hypertension    Insomnia 11/16/2012   Myalgia and myositis, unspecified    Other disorder of muscle, ligament, and fascia    eosinophilic fascitis   Other specified disease of hair and hair follicles    Pain in joint, multiple sites    Parkinson's disease ([place])    Restless leg syndrome 12/15/2013   Unspecified sleep apnea    Urine incontinence  Past Surgical History: Past Surgical History:  Procedure Laterality Date   ABDOMINAL HYSTERECTOMY     ABDOMINOPLASTY  2006   "tummy tuck"   BREAST REDUCTION SURGERY  1995   BREAST SURGERY     CATARACT EXTRACTION, BILATERAL     COLONOSCOPY  2018   MUSCLE BIOPSY  2009   DEEP TISSUE   REFRACTIVE SURGERY     TONSILLECTOMY     TOTAL ABDOMINAL HYSTERECTOMY  1999   WISDOM TOOTH EXTRACTION       Family History: Family History  Problem Relation Age of Onset   Colon polyps Mother    Heart disease Mother    Heart attack  Mother    Heart attack Father    Colon cancer Father 99   Heart disease Father    COPD Sister    Emphysema Sister    COPD Brother    Emphysema Brother    Lung cancer Brother    Breast cancer Paternal Grandmother    Heart disease Paternal Grandfather    Colon polyps Paternal Grandfather    Heart disease Other        family history   Parkinson's disease Other    Stomach cancer Neg Hx    Rectal cancer Neg Hx    Esophageal cancer Neg Hx     Social History: Social History   Socioeconomic History   Marital status: Divorced    Spouse name: Not on file   Number of children: 2   Years of education: Not on file   Highest education level: Not on file  Occupational History   Occupation: Retired    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  Tobacco Use   Smoking status: Former    Types: Cigarettes    Quit date: 12/19/1986    Years since quitting: 34.5   Smokeless tobacco: Never  Vaping Use   Vaping Use: Never used  Substance and Sexual Activity   Alcohol use: Not Currently   Drug use: No   Sexual activity: Yes    Birth control/protection: Surgical, Post-menopausal  Other Topics Concern   Not on file  Social History Narrative   Diet:      Caffeine: occasionally iced coffee      Married, if yes what year: Divorced      Do you live in a house, apartment, assisted living, condo, trailer, ect: House      Is it one or more stories: 1      How many persons live in your home? 1      Pets: Dog      Highest level or education completed: Some college      Current/Past profession: Patent examiner      Exercise:  No                Type and how often:          Living Will: Yes   DNR: No and would like to discuss   POA/HPOA: Yes      Functional Status:   Do you have difficulty bathing or dressing yourself? No   Do you have difficulty preparing food or eating? Yes, occasionally   Do you have difficulty managing your medications? Yes   Do you have  difficulty managing your finances? Yes ( forget password and double pays   Do you have difficulty affording your medications? No   Social Determinants of Radio broadcast assistant Strain: Not on file  Food  Insecurity: Not on file  Transportation Needs: Not on file  Physical Activity: Not on file  Stress: Not on file  Social Connections: Not on file   She lives alone, and says her daughter checks on her about once a week and fills her pill containers.  Allergies: Allergies  Allergen Reactions   Crestor [Rosuvastatin Calcium]     abd pain   Cymbalta [Duloxetine Hcl]     Nausea    Effexor Xr [Venlafaxine Hcl Er]     dizziness   Morphine And Related Itching    agitation   Pravastatin     abd pain   Promethazine-Codeine Itching    Itching in face   Rizatriptan Other (See Comments)    Tingling, prickly sensation, feelings of palpitations, flushing.     Outpatient Meds: Current Outpatient Medications  Medication Sig Dispense Refill   atorvastatin (LIPITOR) 40 MG tablet Take 1 tablet (40 mg total) by mouth daily. 90 tablet 3   carbidopa-levodopa (SINEMET IR) 25-100 MG tablet Take 1 tablet by mouth 4 (four) times daily.     Cholecalciferol 50 MCG (2000 UT) TABS Take 1 tablet by mouth as needed.     clonazePAM (KLONOPIN) 0.5 MG tablet 2 qam & 2 at dinner 120 tablet 4   cyclobenzaprine (FLEXERIL) 5 MG tablet Take 1 tablet by mouth as needed.     donepezil (ARICEPT) 5 MG tablet Take 1 tablet (5 mg total) by mouth at bedtime.     hydrOXYzine (VISTARIL) 25 MG capsule 1  tid (Patient taking differently: daily. 1  tid) 90 capsule 4   mirtazapine (REMERON) 15 MG tablet Take 2 tablets (30 mg total) by mouth at bedtime. 60 tablet 5   ondansetron (ZOFRAN) 4 MG tablet Take 1 tablet (4 mg total) by mouth every 8 (eight) hours as needed for nausea or vomiting. 45 tablet 1   polyethylene glycol-electrolytes (NULYTELY) 420 g solution Take 4,000 mLs by mouth once for 1 dose. For colonoscopy prep  4000 mL 0   propranolol (INDERAL) 40 MG tablet Take 0.5 tablets (20 mg total) by mouth 2 (two) times daily.     RESTASIS 0.05 % ophthalmic emulsion 1 drop 2 (two) times daily.     traZODone (DESYREL) 100 MG tablet Take 200 mg by mouth at bedtime.     No current facility-administered medications for this visit.      ___________________________________________________________________ Objective   Exam:  BP 124/80 (BP Location: Left Arm, Patient Position: Sitting, Cuff Size: Normal)   Pulse 96   Ht 5' 0.25" (1.53 m)   Wt 131 lb 2 oz (59.5 kg)   BMI 25.40 kg/m  Wt Readings from Last 3 Encounters:  07/15/21 131 lb 2 oz (59.5 kg)  06/20/21 132 lb 9.6 oz (60.1 kg)  06/19/21 127 lb (57.6 kg)    General: Pleasant, mild to moderate psychomotor retardation, masked facies Eyes: sclera anicteric, no redness ENT: oral mucosa moist without lesions, no cervical or supraclavicular lymphadenopathy CV: RRR without murmur, S1/S2, no JVD, no peripheral edema Resp: clear to auscultation bilaterally, normal RR and effort noted GI: soft, mild epigastric tenderness, with active bowel sounds. No guarding or palpable organomegaly noted. Skin; warm and dry, no rash or jaundice noted Neuro: awake, alert and oriented x 3. Normal gross motor function and fluent speech Mild hand tremor Rectal: Decreased resting sphincter tone.  No stool in rectal vault.  No mass felt Labs:  Last labs on file January 2022  Radiologic Studies:  No  recent abdominal imaging Assessment: Encounter Diagnoses  Name Primary?   Generalized abdominal pain Yes   Chronic constipation    Nausea in adult     Chronic constipation, worse in the last few months.  Several months of upper abdominal pain with nausea, symptoms worsening lately.  Difficult to fully characterize, at least in part due to patient's impaired memory.  Exam does not suggest obstruction, still could be one that is partial or low-grade.  Consider neoplasia or  upper to account for UGI symptoms.  Plan: CBC and CMP today  CT abdomen and pelvis with oral and IV contrast  EGD and colonoscopy.  She was agreeable after discussion of procedure and risks  The benefits and risks of the planned procedure were described in detail with the patient or (when appropriate) their health care proxy.  Risks were outlined as including, but not limited to, bleeding, infection, perforation, adverse medication reaction leading to cardiac or pulmonary decompensation, pancreatitis (if ERCP).  The limitation of incomplete mucosal visualization was also discussed.  No guarantees or warranties were given.  She says her daughter would be able to bring her for procedure.  I also suggest her daughter be present with her when she does her bowel preparation so she does not get lightheaded or have a fall in her condition.  I have considered recommending MiraLAX 1-2 times daily, but it was unclear whether or not she had tried it or if it helped.  She felt strongly that prune juice at work better for her than anything else, so she is likely to continue that at least for the time being.  Thank you for the courtesy of this consult.  Please call me with any questions or concerns.  Nelida Meuse III  CC: Referring provider noted above

## 2021-07-15 NOTE — Patient Instructions (Addendum)
You have been scheduled for an endoscopy and colonoscopy. Please follow the written instructions given to you at your visit today. Please pick up your prep supplies at the pharmacy within the next 1-3 days. If you use inhalers (even only as needed), please bring them with you on the day of your procedure.  __________________________________  Your provider has requested that you go to the basement level for lab work before leaving today. Press "B" on the elevator. The lab is located at the first door on the left as you exit the elevator.  _________________________________  Continue daily prune juice.  __________________________________  We have sent the following medications to your pharmacy for you to pick up at your convenience: Zofran 4 mg every 8 hours as needed for nausea/vomiting  __________________________________ You have been scheduled for a CT scan of the abdomen and pelvis at [facility] ([address]---this is in the same building as [employer]).   You are scheduled on 07/17/21 at 11:00 am. You should arrive 15 minutes prior to your appointment time for registration. Please follow the written instructions below on the day of your exam:  WARNING: IF YOU ARE ALLERGIC TO IODINE/X-RAY DYE, PLEASE NOTIFY RADIOLOGY IMMEDIATELY AT [phone]! YOU WILL BE GIVEN A 13 HOUR PREMEDICATION PREP.  1) Do not eat or drink anything after 7:00 am (4 hours prior to your test) 2) You have been given 2 bottles of oral contrast to drink. The solution may taste better if refrigerated, but do NOT add ice or any other liquid to this solution. Shake well before drinking.    Drink 1 bottle of contrast @ 9:00 am (2 hours prior to your exam)  Drink 1 bottle of contrast @ 10:00 am (1 hour prior to your exam)  You may take any medications as prescribed with a small amount of water, if necessary. If you take any of the following medications: METFORMIN, GLUCOPHAGE, GLUCOVANCE,  AVANDAMET, RIOMET, FORTAMET, [place] MET, JANUMET, GLUMETZA or METAGLIP, you MAY be asked to HOLD this medication 48 hours AFTER the exam.  The purpose of you drinking the oral contrast is to aid in the visualization of your intestinal tract. The contrast solution may cause some diarrhea. Depending on your individual set of symptoms, you may also receive an intravenous injection of x-ray contrast/dye. Plan on being at [facility] for 30 minutes or longer, depending on the type of exam you are having performed.  This test typically takes 30-45 minutes to complete.  If you have any questions regarding your exam or if you need to reschedule, you may call the CT department at [phone] between the hours of 8:00 am and 5:00 pm, Monday-Friday.  ________________________________________________________  If you are age 60 or older, your body mass index should be between 23-30. Your Body mass index is 25.4 kg/m. If this is out of the aforementioned range listed, please consider follow up with your Primary Care Provider.  If you are age 93 or younger, your body mass index should be between 19-25. Your Body mass index is 25.4 kg/m. If this is out of the aformentioned range listed, please consider follow up with your Primary Care Provider.   ________________________________________________________ The [facility] providers would like to encourage you to use [facility] to communicate with providers for non-urgent requests or questions.  Due to long hold times on the telephone, sending your provider a message by [facility] may be a faster and more efficient way to get a response.  Please  allow 48 business hours for a response.  Please remember that this is for non-urgent requests.   Due to recent changes in healthcare laws, you may see the results of your imaging and laboratory studies on MyChart before your provider has had a chance to review them.  We understand that in some cases there may be results that  are confusing or concerning to you. Not all laboratory results come back in the same time frame and the provider may be waiting for multiple results in order to interpret others.  Please give [place] 48 hours in order for your provider to thoroughly review all the results before contacting the office for clarification of your results.  ________________________________________  It was a pleasure to see you today!  Thank you for trusting me with your gastrointestinal care!

## 2021-07-16 NOTE — Telephone Encounter (Signed)
It is OK, it was not a fasting glucose.  [place],   This patient has memory difficulty. Please call her daughter to make sure she is aware of patient's upcoming procedures, and I think she will need family assistance following prep instructions closely.  - HD

## 2021-07-16 NOTE — Telephone Encounter (Signed)
See alternate patient message. 

## 2021-07-16 NOTE — Telephone Encounter (Signed)
Jerene Pitch,  Joey worked on this case.  No auth required for [facility].  CT has auth in system, and benefits letter was already sent to her mychart. Thanks, Amy

## 2021-07-17 ENCOUNTER — Telehealth: Payer: Self-pay | Admitting: Gastroenterology

## 2021-07-17 ENCOUNTER — Ambulatory Visit (INDEPENDENT_AMBULATORY_CARE_PROVIDER_SITE_OTHER)
Admission: RE | Admit: 2021-07-17 | Discharge: 2021-07-17 | Disposition: A | Discharge status: Home / Self Care | Payer: Medicare HMO | Source: Ambulatory Visit | Attending: Gastroenterology | Admitting: Gastroenterology

## 2021-07-17 ENCOUNTER — Other Ambulatory Visit: Payer: Self-pay

## 2021-07-17 DIAGNOSIS — R1084 Generalized abdominal pain: Secondary | ICD-10-CM | POA: Diagnosis not present

## 2021-07-17 DIAGNOSIS — K5909 Other constipation: Secondary | ICD-10-CM | POA: Diagnosis not present

## 2021-07-17 DIAGNOSIS — R110 Nausea: Secondary | ICD-10-CM

## 2021-07-17 DIAGNOSIS — R109 Unspecified abdominal pain: Secondary | ICD-10-CM | POA: Diagnosis not present

## 2021-07-17 MED ORDER — IOHEXOL 350 MG/ML SOLN
80.0000 mL | Freq: Once | INTRAVENOUS | Status: AC | PRN
  Administered 2021-07-17: 80 mL via INTRAVENOUS

## 2021-07-17 NOTE — Telephone Encounter (Signed)
Pt's concerns have been addressed. See telephone encounter for more information.

## 2021-07-17 NOTE — Telephone Encounter (Signed)
Lm on vm for Shannon Sutton to return call to discuss patient's upcoming procedure appt.

## 2021-07-17 NOTE — Telephone Encounter (Signed)
Pt. Called. Had CAT Scan with contrast today. Now is experiencing extreme bloating, can't pass gas, cant eat or drink water--makes her feel even more bloated.  Feels like she is going to blow up and needs relief ASAP.  Please call and advise.  Thank you.

## 2021-07-17 NOTE — Telephone Encounter (Signed)
Spoke with patient, she reports that she has been constipated since she was 75 years old. Patient states that she has been taking Prune juice 1 cup 2-3 times a day. Pt reports that after her CT scan she strained hard to have a bowel movement. Pt states that she only passed a small amount of stool but since her stomach has gone down some. She reports that she was passing gas prior to the CT scan. Advised that she continue the Prune juice but may need additional help. Advised patient to add in Miralax 1-2 times a day as well, advised that she can mix this in water if she wants. Pt is aware that we will contact her with CT results and recommendations. Pt verbalized understanding and had no concerns at the end of the call.

## 2021-07-18 NOTE — Telephone Encounter (Signed)
I understand the ongoing challenge communicating with this patient through the portal regarding her bowel habits.  It has been difficult to get a clear and consistent history about that.  My advice is to stop the prune juice and, beginning tomorrow, (after the CT contrast cathartic effect has worn off), to start Miralax one capful in a glass of water once daily. Hopefully, when you hear back from her daughter these plans can also be conveyed to her.  - HD

## 2021-07-18 NOTE — Telephone Encounter (Signed)
Left detailed vm for Shannon Sutton in regards to patient's upcoming procedures. I left all of the appt information on her vm and wanted to clarify if they would be assisting the patient with her prep. Barnetta Hammersmith to call me back to confirm receipt of message.

## 2021-07-19 NOTE — Telephone Encounter (Signed)
See 07/17/21 CT result note for more information.

## 2021-07-22 MED ORDER — LINACLOTIDE 145 MCG PO CAPS: 145.0000 ug | ORAL_CAPSULE | Freq: Every day | ORAL | 1 refills | Status: DC

## 2021-07-22 NOTE — Telephone Encounter (Signed)
Dr. Loletha Carrow, please see patient's note and advise. Thanks

## 2021-07-22 NOTE — Addendum Note (Signed)
Addended by: Yevette Edwards on: 07/22/2021 01:07 PM   Modules accepted: Orders

## 2021-07-22 NOTE — Telephone Encounter (Signed)
Patient called in. Would like a call back for clear instructions about why she needs to do the purge tomorrow  in addition to the prep for procedure next week.

## 2021-07-22 NOTE — Telephone Encounter (Signed)
Please give her instructions for 20 mg Dulcolax followed by a MiraLAX bowel purge (238 g bottle of MiraLAX and 2 L Gatorade or Crystal light) -1 cup every 30 minutes until complete  Then the following day, start Linzess 145 mcg once daily Dispense 30, refill 1

## 2021-07-23 NOTE — Telephone Encounter (Signed)
Attempted to reach patient twice at home phone number. Unable to leave a vm because vm is not set up.

## 2021-07-23 NOTE — Telephone Encounter (Signed)
Pt returned call. I have reviewed recommendations and clarified all information. Pt is aware that she will still have to prep for colonoscopy when that time comes. Pt verbalized understanding and had no concerns at the end of the call.

## 2021-07-23 NOTE — Telephone Encounter (Signed)
Spoke with patient to clarify. All concerns have been addressed.

## 2021-07-25 ENCOUNTER — Encounter: Payer: Self-pay | Admitting: Family Medicine

## 2021-07-25 ENCOUNTER — Other Ambulatory Visit: Payer: Self-pay

## 2021-07-25 ENCOUNTER — Telehealth (INDEPENDENT_AMBULATORY_CARE_PROVIDER_SITE_OTHER): Payer: Medicare HMO | Admitting: Family Medicine

## 2021-07-25 VITALS — Wt 123.0 lb

## 2021-07-25 DIAGNOSIS — G4700 Insomnia, unspecified: Secondary | ICD-10-CM | POA: Diagnosis not present

## 2021-07-25 NOTE — Progress Notes (Signed)
Virtual Visit via Telephone Note  I connected with Shannon Sutton on 07/25/21 at  5:00 PM EDT by telephone and verified that I am speaking with the correct person using two identifiers.   I discussed the limitations, risks, security and privacy concerns of performing an evaluation and management service by telephone and the availability of in person appointments. I also discussed with the patient that there may be a patient responsible charge related to this service. The patient expressed understanding and agreed to proceed.  Location patient: home, [place] Location provider: work or home office Participants present for the call: patient, provider Patient did not have a visit with me in the prior 7 days to address this/these issue(s).   History of Present Illness:  Acute telemedicine visit for Sleep issues: -Onset: years ago, but is worsening -Symptoms include: falls asleep ok with melatonin, but then sleeps a few hours and then wakes up and has difficulty falling back asleep - a lot of stuff is going through her mind and she can't shut of her mind -is already mirtazapine, remeron and trazadone and these do not help -lunesta also has not helped -no regular exercise   Observations/Objective: Patient sounds cheerful and well on the phone. I do not appreciate any SOB. Speech and thought processing are grossly intact. Patient reported vitals:  Assessment and Plan:  Insomnia, unspecified type  Had a lengthy discussion. For her, multiple sedative medications and sleep pills have not worked. Have advised CBT for sleep and sleep hygiene - discussed at length and number for [place] [facility] provided and enclosed in patient instructions. Also suggested discussing with her parkinson's specialist as well. Advised follow up with PCP.  Advised to seek prompt in person care if worsening, new symptoms arise, or if is not improving with treatment. Advised of options for inperson care in case PCP  office not available. Did let the patient know that I only do telemedicine shifts for [place] on Tuesdays and Thursdays and advised a follow up visit with PCP or at an [facility] if has further questions or concerns.   Follow Up Instructions:  I did not refer this patient for an OV with me in the next 24 hours for this/these issue(s).  I discussed the assessment and treatment plan with the patient. The patient was provided an opportunity to ask questions and all were answered. The patient agreed with the plan and demonstrated an understanding of the instructions.   I spent 21 minutes on the date of this visit in the care of this patient. See summary of tasks completed to properly care for this patient in the detailed notes above which also included counseling of above, review of PMH, medications, allergies, evaluation of the patient and ordering and/or  instructing patient on testing and care options.     Lucretia Kern, DO

## 2021-07-25 NOTE — Patient Instructions (Addendum)
FOR IMPROVED SLEEP AND TO RESET YOUR SLEEP SCHEDULE:  []  Schedule sleep counseling(cognitive behavioral therapy).   [place] is a good option.   Call for appointment: [phone]  []  Exercise 30 minutes daily. Avoid caffeine and alcohol - particularly in the evenings.  []  Go to bed and wake up at the same time everyday.  []  Keep bedroom cool, dark and quiet  []  Reserve bed for sleep - do not read, watch TV, look at phone or device, etc., in bed.  []  If you toss and turn for more then 10-15 minutes, get out of bed and list or journal thoughts or do quiet activity (not screen-time, no tv, phone, computer) then go back to bed. Repeat as needed. Try not to worry about when you will eventually fall asleep.  []  Seek help for any depression or anxiety.  [] Prescription strength sleep medications should only be used in severe cases of insomnia if other measures fail and should be used sparingly.  I hope you are feeling better soon! Follow up with your doctor in 3-4 weeks or sooner if your symptoms worsen or new concerns arise.

## 2021-07-29 NOTE — Telephone Encounter (Signed)
I called patient, she had concerns because she completed the bowel purge about 5 days ago and hasn't had a bowel movement. Pt states that she is taking Linzess as prescribed. Advised that she cleansed her colon and it will take some time for her bowel to return to "normal". Pt had questions in regards to her sleep, reports that she only gets about 2-4 hours of sleep at night. Advised pt to reach out to her CT scan. Pt denies any alcohol of caffeine. Pt also had concerns about her neck and back, she plans to see a spinal doctor soon for cortisone injections. Pt verbalized understanding and had no concerns at the end of the call.

## 2021-07-30 ENCOUNTER — Telehealth: Payer: Self-pay | Admitting: Diagnostic Neuroimaging

## 2021-07-30 NOTE — Telephone Encounter (Signed)
Pt called, would like a call from the nurse to discuss if Dr. Leta Baptist can be my Parkinson's neurologist.

## 2021-07-31 ENCOUNTER — Encounter: Payer: Self-pay | Admitting: Gastroenterology

## 2021-07-31 ENCOUNTER — Ambulatory Visit (AMBULATORY_SURGERY_CENTER): Payer: Medicare HMO | Admitting: Gastroenterology

## 2021-07-31 VITALS — BP 150/84 | HR 74 | Temp 97.8°F | Resp 15 | Ht 60.0 in | Wt 131.0 lb

## 2021-07-31 DIAGNOSIS — K5730 Diverticulosis of large intestine without perforation or abscess without bleeding: Secondary | ICD-10-CM | POA: Diagnosis not present

## 2021-07-31 DIAGNOSIS — D122 Benign neoplasm of ascending colon: Secondary | ICD-10-CM | POA: Diagnosis not present

## 2021-07-31 DIAGNOSIS — I10 Essential (primary) hypertension: Secondary | ICD-10-CM | POA: Diagnosis not present

## 2021-07-31 DIAGNOSIS — R1084 Generalized abdominal pain: Secondary | ICD-10-CM

## 2021-07-31 DIAGNOSIS — Z86010 Personal history of colonic polyps: Secondary | ICD-10-CM | POA: Diagnosis not present

## 2021-07-31 DIAGNOSIS — R110 Nausea: Secondary | ICD-10-CM

## 2021-07-31 DIAGNOSIS — D123 Benign neoplasm of transverse colon: Secondary | ICD-10-CM

## 2021-07-31 DIAGNOSIS — K2970 Gastritis, unspecified, without bleeding: Secondary | ICD-10-CM

## 2021-07-31 DIAGNOSIS — G20 Parkinson's disease: Secondary | ICD-10-CM | POA: Diagnosis not present

## 2021-07-31 DIAGNOSIS — R109 Unspecified abdominal pain: Secondary | ICD-10-CM | POA: Diagnosis not present

## 2021-07-31 DIAGNOSIS — K5909 Other constipation: Secondary | ICD-10-CM | POA: Diagnosis not present

## 2021-07-31 DIAGNOSIS — K319 Disease of stomach and duodenum, unspecified: Secondary | ICD-10-CM | POA: Diagnosis not present

## 2021-07-31 MED ORDER — SODIUM CHLORIDE 0.9 % IV SOLN: 500.0000 mL | Freq: Once | INTRAVENOUS | Status: DC

## 2021-07-31 MED ORDER — LINACLOTIDE 72 MCG PO CAPS: 72.0000 ug | ORAL_CAPSULE | Freq: Every day | ORAL | 1 refills | Status: DC

## 2021-07-31 MED ORDER — OMEPRAZOLE 40 MG PO CPDR
40.0000 mg | DELAYED_RELEASE_CAPSULE | Freq: Every day | ORAL | 0 refills | Status: DC
Start: 2021-07-31 — End: 2021-09-27

## 2021-07-31 NOTE — Op Note (Signed)
[facility] Patient Name: Shannon Sutton Procedure Date: 07/31/2021 3:35 PM MRN: [id number] Endoscopist: Sherilyn Cooter L. Myrtie Neither , MD Age: 75 Referring MD:  Date of Birth: 09-13-1946 Gender: Female Account #: [id number] Procedure:                Upper GI endoscopy Indications:              Nausea Medicines:                Monitored Anesthesia Care Procedure:                Pre-Anesthesia Assessment:                           - Prior to the procedure, a History and Physical                            was performed, and patient medications and                            allergies were reviewed. The patient's tolerance of                            previous anesthesia was also reviewed. The risks                            and benefits of the procedure and the sedation                            options and risks were discussed with the patient.                            All questions were answered, and informed consent                            was obtained. Prior Anticoagulants: The patient has                            taken no previous anticoagulant or antiplatelet                            agents. ASA Grade Assessment: II - A patient with                            mild systemic disease. After reviewing the risks                            and benefits, the patient was deemed in                            satisfactory condition to undergo the procedure.                           After obtaining informed consent, the endoscope was  passed under direct vision. Throughout the                            procedure, the patient's blood pressure, pulse, and                            oxygen saturations were monitored continuously. The                            Endoscope was introduced through the mouth, and                            advanced to the second part of duodenum. The upper                            GI endoscopy was accomplished without difficulty.                             The patient tolerated the procedure well. Scope In: Scope Out: Findings:                 The esophagus was normal.                           Inflammation characterized by bile staining and                            adherent heme was found in the gastric antrum.                            Several biopsies were obtained on the greater                            curvature of the gastric body, on the lesser                            curvature of the gastric body, on the greater                            curvature of the gastric antrum and on the lesser                            curvature of the gastric antrum with cold forceps                            for histology. Some of the gastric mucosa was                            obscured by adherent material.                           The exam of the stomach was otherwise normal.  The cardia and gastric fundus were normal on                            retroflexion.                           The examined duodenum was normal. Complications:            No immediate complications. Estimated Blood Loss:     Estimated blood loss was minimal. Impression:               - Normal esophagus.                           - Gastritis.                           - Normal examined duodenum.                           - Several biopsies were obtained on the greater                            curvature of the gastric body, on the lesser                            curvature of the gastric body, on the greater                            curvature of the gastric antrum and on the lesser                            curvature of the gastric antrum.                           Not clear if nausea from these findings or an                            ancillary symptom of patient's constipation. Recommendation:           - Patient has a contact number available for                            emergencies. The signs and  symptoms of potential                            delayed complications were discussed with the                            patient. Return to normal activities tomorrow.                            Written discharge instructions were provided to the                            patient.                           -  Resume previous diet.                           - Continue present medications.                           - Use Prilosec (omeprazole) 40 mg PO daily for 4                            weeks. Disp #30, RF 0                           - See the other procedure note for documentation of                            additional recommendations. Jelene Albano L. Myrtie Neither, MD 07/31/2021 4:25:17 PM This report has been signed electronically.

## 2021-07-31 NOTE — Patient Instructions (Signed)
Discharge instructions given. Handouts on polyps,diverticulosis and Gastritis. Resume previous medications. Prescriptions sent to pharmacy. YOU HAD AN ENDOSCOPIC PROCEDURE TODAY AT [facility]:   Refer to the procedure report that was given to you for any specific questions about what was found during the examination.  If the procedure report does not answer your questions, please call your gastroenterologist to clarify.  If you requested that your care partner not be given the details of your procedure findings, then the procedure report has been included in a sealed envelope for you to review at your convenience later.  YOU SHOULD EXPECT: Some feelings of bloating in the abdomen. Passage of more gas than usual.  Walking can help get rid of the air that was put into your GI tract during the procedure and reduce the bloating. If you had a lower endoscopy (such as a colonoscopy or flexible sigmoidoscopy) you may notice spotting of blood in your stool or on the toilet paper. If you underwent a bowel prep for your procedure, you may not have a normal bowel movement for a few days.  Please Note:  You might notice some irritation and congestion in your nose or some drainage.  This is from the oxygen used during your procedure.  There is no need for concern and it should clear up in a day or so.  SYMPTOMS TO REPORT IMMEDIATELY:  Following lower endoscopy (colonoscopy or flexible sigmoidoscopy):  Excessive amounts of blood in the stool  Significant tenderness or worsening of abdominal pains  Swelling of the abdomen that is new, acute  Fever of 100F or higher  Following upper endoscopy (EGD)  Vomiting of blood or coffee ground material  New chest pain or pain under the shoulder blades  Painful or persistently difficult swallowing  New shortness of breath  Fever of 100F or higher  Black, tarry-looking stools  For urgent or emergent issues, a gastroenterologist can be reached at  any hour by calling [phone]. Do not use MyChart messaging for urgent concerns.    DIET:  We do recommend a small meal at first, but then you may proceed to your regular diet.  Drink plenty of fluids but you should avoid alcoholic beverages for 24 hours.  ACTIVITY:  You should plan to take it easy for the rest of today and you should NOT DRIVE or use heavy machinery until tomorrow (because of the sedation medicines used during the test).    FOLLOW UP: Our staff will call the number listed on your records 48-72 hours following your procedure to check on you and address any questions or concerns that you may have regarding the information given to you following your procedure. If we do not reach you, we will leave a message.  We will attempt to reach you two times.  During this call, we will ask if you have developed any symptoms of COVID 19. If you develop any symptoms (ie: fever, flu-like symptoms, shortness of breath, cough etc.) before then, please call [phone].  If you test positive for Covid 19 in the 2 weeks post procedure, please call and report this information to [place].    If any biopsies were taken you will be contacted by phone or by letter within the next 1-3 weeks.  Please call us at [phone] if you have not heard about the biopsies in 3 weeks.    SIGNATURES/CONFIDENTIALITY: You and/or your care partner have signed paperwork which will be entered into your electronic medical record.  These signatures attest to the fact that that the information above on your After Visit Summary has been reviewed and is understood.  Full responsibility of the confidentiality of this discharge information lies with you and/or your care-partner.  

## 2021-07-31 NOTE — Op Note (Signed)
[facility] Patient Name: Shannon Sutton Procedure Date: 07/31/2021 3:36 PM MRN: [id number] Endoscopist: Sherilyn Cooter L. Myrtie Neither , MD Age: 75 Referring MD:  Date of Birth: May 02, 1946 Gender: Female Account #: [id number] Procedure:                Colonoscopy Indications:              Generalized abdominal pain, Constipation                           recent CT scan unrevealing for cause other than                            constipation                           take prune juice with variable effect, states                            Miralax does not work                           last colonoscopy in 2018 for FHx CRC Medicines:                Monitored Anesthesia Care Procedure:                Pre-Anesthesia Assessment:                           - Prior to the procedure, a History and Physical                            was performed, and patient medications and                            allergies were reviewed. The patient's tolerance of                            previous anesthesia was also reviewed. The risks                            and benefits of the procedure and the sedation                            options and risks were discussed with the patient.                            All questions were answered, and informed consent                            was obtained. Prior Anticoagulants: The patient has                            taken no previous anticoagulant or antiplatelet                            agents.  ASA Grade Assessment: II - A patient with                            mild systemic disease. After reviewing the risks                            and benefits, the patient was deemed in                            satisfactory condition to undergo the procedure.                           After obtaining informed consent, the colonoscope                            was passed under direct vision. Throughout the                            procedure, the patient's blood  pressure, pulse, and                            oxygen saturations were monitored continuously. The                            Olympus CF-HQ190L ([id number]) Colonoscope was                            introduced through the anus and advanced to the the                            cecum, identified by appendiceal orifice and                            ileocecal valve. The colonoscopy was somewhat                            difficult due to a tortuous colon. The patient                            tolerated the procedure well. The quality of the                            bowel preparation was fair(lavage performed). The                            ileocecal valve, appendiceal orifice, and rectum                            were photographed. The bowel preparation used was                            GoLYTELY. Scope In: 3:48:43 PM Scope Out: 4:01:19 PM Scope Withdrawal Time: 0 hours 9 minutes 29 seconds  Total Procedure Duration: 0 hours 12 minutes  36 seconds  Findings:                 The digital rectal exam findings include decreased                            sphincter tone.                           Two sessile polyps were found in the transverse                            colon and ascending colon. The polyps were 5 mm in                            size. These polyps were removed with a cold snare.                            Resection and retrieval were complete.                           Multiple diverticula were found in the left colon                            with associated naustral thickening and tortuosity.                           The exam was otherwise without abnormality on                            direct and retroflexion views. Complications:            No immediate complications. Estimated Blood Loss:     Estimated blood loss was minimal. Impression:               - Preparation of the colon was fair.                           - Decreased sphincter tone found on digital rectal                             exam.                           - Two 5 mm polyps in the transverse colon and in                            the ascending colon, removed with a cold snare.                            Resected and retrieved.                           - Diverticulosis in the left colon.                           - The  examination was otherwise normal on direct                            and retroflexion views. Recommendation:           - Patient has a contact number available for                            emergencies. The signs and symptoms of potential                            delayed complications were discussed with the                            patient. Return to normal activities tomorrow.                            Written discharge instructions were provided to the                            patient.                           - Resume previous diet.                           - Continue present medications.                           - Use Linzess (linaclotide) 72 mcg PO daily.                            Disp#30, RF 1                           - No repeat routine surveillance colonoscopy                            recommended due to age and today's findings.                           - See the other procedure note for documentation of                            additional recommendations. Tonga Prout L. Myrtie Neither, MD 07/31/2021 4:19:46 PM This report has been signed electronically.

## 2021-07-31 NOTE — Progress Notes (Signed)
1545 Robinul 0.1 mg IV given due large amount of secretions upon assessment.  MD made aware, vss

## 2021-07-31 NOTE — Progress Notes (Signed)
No changes to clinical history since GI office visit on 07/15/21.  The patient is appropriate for an endoscopic procedure in the ambulatory setting.

## 2021-07-31 NOTE — Progress Notes (Signed)
Report given to PACU, vss 

## 2021-07-31 NOTE — Progress Notes (Signed)
VS completed by DT.    Medical history reviewed and updated.  

## 2021-07-31 NOTE — Telephone Encounter (Signed)
Pt sent my chart on this question yesterday. See mychart message from 07/30/2021 for reference/response.

## 2021-08-02 ENCOUNTER — Ambulatory Visit: Payer: Medicare HMO | Admitting: Nurse Practitioner

## 2021-08-02 ENCOUNTER — Telehealth: Payer: Self-pay | Admitting: *Deleted

## 2021-08-02 NOTE — Telephone Encounter (Signed)
  Follow up Call-  Call back number 07/31/2021  Post procedure Call Back phone  # [phone]  Permission to leave phone message Yes  Some recent data might be hidden     Patient questions:  Do you have a fever, pain , or abdominal swelling? No. Pain Score  0 *  Have you tolerated food without any problems? Yes.    Have you been able to return to your normal activities? Yes.    Do you have any questions about your discharge instructions: Diet   No. Medications  No. Follow up visit  No.  Do you have questions or concerns about your Care? No.  Actions: * If pain score is 4 or above: No action needed, pain <4.  Have you developed a fever since your procedure? no  2.   Have you had an respiratory symptoms (SOB or cough) since your procedure? no  3.   Have you tested positive for COVID 19 since your procedure no  4.   Have you had any family members/close contacts diagnosed with the COVID 19 since your procedure?  no   If yes to any of these questions please route to Joylene John, RN and Joella Prince, RN

## 2021-08-05 ENCOUNTER — Encounter: Payer: Self-pay | Admitting: Internal Medicine

## 2021-08-06 MED ORDER — CYCLOBENZAPRINE HCL 5 MG PO TABS: 5.0000 mg | ORAL_TABLET | ORAL | 0 refills | Status: DC | PRN

## 2021-08-06 NOTE — Addendum Note (Signed)
Addended by: Nilda Riggs on: 08/06/2021 07:42 AM   Modules accepted: Orders

## 2021-08-07 ENCOUNTER — Ambulatory Visit (HOSPITAL_COMMUNITY): Payer: Medicare HMO | Admitting: Psychiatry

## 2021-08-08 ENCOUNTER — Telehealth: Payer: Self-pay | Admitting: *Deleted

## 2021-08-08 NOTE — Telephone Encounter (Signed)
Prior auth started for: Cyclobenzaprine 5 mg Key: [id number]

## 2021-08-12 ENCOUNTER — Telehealth: Payer: Self-pay

## 2021-08-12 NOTE — Telephone Encounter (Incomplete Revision)
Patient called and stated that she was not able to sleep last night and hasn't gone to the bathroom in 3 weeks. Please review and advise. Thank you  Note completed  error

## 2021-08-12 NOTE — Telephone Encounter (Addendum)
Patient called and stated that she was not able to sleep last night and hasn't gone to the bathroom in 3 weeks. Please review and advise. Thank you  Note completed  error

## 2021-08-13 ENCOUNTER — Telehealth: Payer: Self-pay | Admitting: Gastroenterology

## 2021-08-13 NOTE — Telephone Encounter (Signed)
Nothing further at this time

## 2021-08-13 NOTE — Telephone Encounter (Signed)
Inbound call from patient calling in want to speak about her CT scan and scheduling an f/u appt. I attempted to schedule the appt but she did not want to do so with the scheduler.

## 2021-08-13 NOTE — Telephone Encounter (Signed)
Thank you for the note, we will follow-up with her  Not entirely clear if her recollection is accurate given memory difficulty and report to our staff on 08/07/2021 that her bowels were moving regularly on Linzess.  Madelon Lips, MD

## 2021-08-13 NOTE — Telephone Encounter (Signed)
Inbound call from pt requesting a call back stating that she is having abd pain. Please advise. Thank you.

## 2021-08-13 NOTE — Telephone Encounter (Signed)
Returned patient's call and she states she drank prune juice and has had 2 Bms, that she feels better.

## 2021-08-14 NOTE — Telephone Encounter (Signed)
See alternate phone note  

## 2021-08-15 ENCOUNTER — Other Ambulatory Visit: Payer: Self-pay | Admitting: Cardiology

## 2021-08-15 DIAGNOSIS — E7800 Pure hypercholesterolemia, unspecified: Secondary | ICD-10-CM

## 2021-08-16 ENCOUNTER — Telehealth: Payer: Self-pay | Admitting: Diagnostic Neuroimaging

## 2021-08-16 NOTE — Telephone Encounter (Signed)
Pt would like to know if the provider will fill her ropinirole again. Please advise.

## 2021-08-16 NOTE — Telephone Encounter (Signed)
This request has been approved. °

## 2021-08-19 NOTE — Telephone Encounter (Signed)
Called patient and informed her ropinirole wasn't refilled last time because she didn't have a FU scheduled. She now does. I offered to check for sooner FU, she stated she didn't want one, will discuss refill at FU in Dec. She confirmed date, time. Patient verbalized understanding, appreciation.'

## 2021-08-21 ENCOUNTER — Other Ambulatory Visit: Payer: Self-pay

## 2021-08-21 ENCOUNTER — Other Ambulatory Visit (HOSPITAL_COMMUNITY): Payer: Self-pay | Admitting: *Deleted

## 2021-08-21 ENCOUNTER — Telehealth (HOSPITAL_BASED_OUTPATIENT_CLINIC_OR_DEPARTMENT_OTHER): Payer: Medicare HMO | Admitting: Psychiatry

## 2021-08-21 DIAGNOSIS — F4323 Adjustment disorder with mixed anxiety and depressed mood: Secondary | ICD-10-CM

## 2021-08-21 DIAGNOSIS — R69 Illness, unspecified: Secondary | ICD-10-CM | POA: Diagnosis not present

## 2021-08-21 MED ORDER — HYDROXYZINE PAMOATE 50 MG PO CAPS: ORAL_CAPSULE | ORAL | 1 refills | Status: DC

## 2021-08-21 NOTE — Progress Notes (Addendum)
Psychiatric Initial Adult Assessment   Patient Identification: Shannon Sutton MRN:  [id number] Date of Evaluation:  08/21/2021 Referral Source Dr. Alain Marion Chief Complaint:  Depression Visit Diagnosis major depression single episode in remission  History of Present Illness  Today the patient was contacted via phone.  This is a phone visit.  The patient says that she is in pain.  She says it is generalized and mainly describes great problems sleeping.  She claims the reason she thinks she is not sleeping well is because her nose gets congested and she wakes up unable to breathe.  The patient truly denies persistent daily depression.  Her appetite is fairly good.  She ended up never making contact with a therapist.  Today we give her the name of the new therapy system called [facility].  She is interested in cognitive behavioral therapy.  The patient is obsessed with her sleep.  She is hard to interpret.  On her last visit we reduced her Remeron from 30 to 15 mg.  The patient is unaware of what medication she is taking.  Unfortunately we had a call later than expected and her daughter [name] was not available at the time.  The patient says that her daughter has a name of a medicine to help her sleep.  We shared with her that she can call us back.  Note is that the patient has stopped trazodone per her request.  I am not really certain of how much the patient's sleep is affected.  The patient is attempting to change her neurologist to someone in [place].  Noted in her previous visits that we communicated and collaborated with her neurologist in [place].  They claim that much of her symptom complaints are due to are related to Parkinson's.  The patient does remember that discussion with me on her last visit.  Virtual Visit via Telephone Note  I connected with [name] 08/21/2021 at  2:30 PM EST by telephone and verified that I am speaking with the correct person using two  identifiers.  Location: Patient: home Provider: office   I discussed the limitations, risks, security and privacy concerns of performing an evaluation and management service by telephone and the availability of in person appointments. I also discussed with the patient that there may be a patient responsible charge related to this service. The patient expressed understanding and agreed to proceed.       I discussed the assessment and treatment plan with the patient. The patient was provided an opportunity to ask questions and all were answered. The patient agreed with the plan and demonstrated an understanding of the instructions.   The patient was advised to call back or seek an in-person evaluation if the symptoms worsen or if the condition fails to improve as anticipated.  I provided 30 minutes of non-face-to-face time during this encounter.   Jerral Ralph, MD   Depression Symptoms:  depressed mood, (Hypo) Manic Symptoms:   Anxiety Symptoms:   Psychotic Symptoms:   PTSD Symptoms:   Past Psychiatric History: Prozac, Effexor,Vybrid  Previous Psychotropic Medications: Yes   Substance Abuse History in the last 12 months:  No.  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  Dr Trudie Reed 2009 had a bx/MRI: was on MTX and Prednisone   External hemorrhoids    Fibromyalgia with myofascial pain 11/16/2012   Headaches, cluster    History of CT scan of head 2022   History of EKG 2021   History of mammogram 2022   History of MRI 2022   Brain   Hypercholesterolemia 12/15/2013   Hypertension    Insomnia 11/16/2012   Myalgia and myositis, unspecified    Other disorder of muscle, ligament, and fascia     eosinophilic fascitis   Other specified disease of hair and hair follicles    Pain in joint, multiple sites    Parkinson's disease ([place])    Restless leg syndrome 12/15/2013   Unspecified sleep apnea    Urine incontinence     Past Surgical History:  Procedure Laterality Date   ABDOMINAL HYSTERECTOMY     ABDOMINOPLASTY  2006   "tummy tuck"   BREAST REDUCTION SURGERY  1995   BREAST SURGERY     CATARACT EXTRACTION, BILATERAL     COLONOSCOPY  2018   MUSCLE BIOPSY  2009   DEEP TISSUE   REFRACTIVE SURGERY     TONSILLECTOMY     TOTAL ABDOMINAL HYSTERECTOMY  1999   WISDOM TOOTH EXTRACTION      Family Psychiatric History:   Family History:  Family History  Problem Relation Age of Onset   Colon polyps Mother    Heart disease Mother    Heart attack Mother    Heart attack Father    Colon cancer Father 59   Heart disease Father    COPD Sister    Emphysema Sister    COPD Brother    Emphysema Brother    Lung cancer Brother    Breast cancer Paternal Grandmother    Heart disease Paternal Grandfather    Colon polyps Paternal Grandfather    Heart disease Other        family history   Parkinson's disease Other    Stomach cancer Neg Hx    Rectal cancer Neg Hx    Esophageal cancer Neg Hx     Social History:   Social History   Socioeconomic History   Marital status: Divorced    Spouse name: Not on file   Number of children: 2   Years of education: Not on file   Highest education level: Not on file  Occupational History   Occupation: Retired    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  Tobacco Use   Smoking status: Former    Types: Cigarettes    Quit date: 12/19/1986    Years since quitting: 34.6   Smokeless tobacco: Never  Vaping Use   Vaping Use: Never used  Substance and Sexual Activity   Alcohol use: Not Currently   Drug use: No   Sexual activity: Yes    Birth control/protection: Surgical, Post-menopausal  Other Topics Concern   Not on file   Social History Narrative   Diet:      Caffeine: occasionally iced coffee      Married, if yes what year: Divorced      Do you live in a house, apartment, assisted living, condo, trailer, ect: House      Is it one or more stories: 1      How many persons live in your home? 1      Pets: Dog      Highest level or education  completed: Some college      Current/Past profession: Patent examiner      Exercise:  No                Type and how often:          Living Will: Yes   DNR: No and would like to discuss   POA/HPOA: Yes      Functional Status:   Do you have difficulty bathing or dressing yourself? No   Do you have difficulty preparing food or eating? Yes, occasionally   Do you have difficulty managing your medications? Yes   Do you have difficulty managing your finances? Yes ( forget password and double pays   Do you have difficulty affording your medications? No   Social Determinants of Radio broadcast assistant Strain: Not on file  Food Insecurity: Not on file  Transportation Needs: Not on file  Physical Activity: Not on file  Stress: Not on file  Social Connections: Not on file    Additional Social History:   Allergies:   Allergies  Allergen Reactions   Crestor [Rosuvastatin Calcium]     abd pain   Cymbalta [Duloxetine Hcl]     Nausea    Effexor Xr [Venlafaxine Hcl Er]     dizziness   Morphine And Related Itching    agitation   Pravastatin     abd pain   Promethazine-Codeine Itching    Itching in face   Rizatriptan Other (See Comments)    Tingling, prickly sensation, feelings of palpitations, flushing.     Metabolic Disorder Labs: Lab Results  Component Value Date   HGBA1C 5.0 02/18/2019   No results found for: PROLACTIN Lab Results  Component Value Date   CHOL 171 06/20/2020   TRIG 65 06/20/2020   HDL 89 06/20/2020   CHOLHDL 1.9 06/20/2020   VLDL 11.4 03/18/2018   LDLCALC 69 06/20/2020   LDLCALC 84 05/16/2019     Current  Medications: Current Outpatient Medications  Medication Sig Dispense Refill   atorvastatin (LIPITOR) 40 MG tablet Take 1 tablet by mouth once daily 90 tablet 3   carbidopa-levodopa (SINEMET IR) 25-100 MG tablet Take 1 tablet by mouth 4 (four) times daily.     clonazePAM (KLONOPIN) 0.5 MG tablet 2 qam & 2 at dinner 120 tablet 4   cyclobenzaprine (FLEXERIL) 5 MG tablet Take 1 tablet (5 mg total) by mouth as needed. 30 tablet 0   donepezil (ARICEPT) 5 MG tablet Take 1 tablet (5 mg total) by mouth at bedtime.     hydrOXYzine (VISTARIL) 25 MG capsule 1  tid (Patient taking differently: daily. 1  tid) 90 capsule 4   linaclotide (LINZESS) 145 MCG CAPS capsule Take 1 capsule (145 mcg total) by mouth daily before breakfast. 30 capsule 1   linaclotide (LINZESS) 72 MCG capsule Take 1 capsule (72 mcg total) by mouth daily before breakfast. 30 capsule 1   mirtazapine (REMERON) 15 MG tablet Take 2 tablets (30 mg total) by mouth at bedtime. 60 tablet 5   omeprazole (PRILOSEC) 40 MG capsule Take 1 capsule (40 mg total) by mouth daily. 30 capsule 0   ondansetron (ZOFRAN) 4 MG tablet Take 1 tablet (4 mg total) by mouth every 8 (eight) hours as needed for nausea or vomiting. 45 tablet 1   propranolol (INDERAL) 40 MG tablet Take 0.5 tablets (20 mg total) by mouth 2 (two) times daily.     RESTASIS 0.05 % ophthalmic emulsion 1 drop  2 (two) times daily.     traZODone (DESYREL) 100 MG tablet Take 200 mg by mouth at bedtime.     No current facility-administered medications for this visit.    Neurologic: Headache: No Seizure: No Paresthesias:No  Musculoskeletal: Strength & Muscle Tone: abnormal Gait & Station: unsteady Patient leans: N/A  Psychiatric Specialty Exam: ROS  There were no vitals taken for this visit.There is no height or weight on file to calculate BMI.  General Appearance: Fairly Groomed  Eye Contact:  Good  Speech:  Clear and Coherent  Volume:  Normal  Mood:  Depressed  Affect:   Appropriate  Thought Process:  Goal Directed  Orientation:  Full (Time, Place, and Person)  Thought Content:  WDL  Suicidal Thoughts:  No  Homicidal Thoughts:  No  Memory:  NA  Judgement:  Good  Insight:  Fair  Psychomotor Activity:  Normal  Concentration:    Recall:  [place] of Knowledge:Good  Language: Fair  Akathisia:  No  Handed:  Right  AIMS (if indicated):    Assets:  Desire for Improvement  ADL's:  Intact  Cognition: WNL  Sleep:      Treatment Plan Summary:  11/9/20223:33 PM    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  She will continue taking Remeron 15 mg.  Her second problem is insomnia.  Today we will go ahead and add Vistaril 50 mg.  Hopefully this will help her sleep and perhaps drive her up.  Perhaps she will be able to breathe easier.  While she has been on Vistaril in the past she has never taken 50 mg at.  Her second problem is an adjustment disorder with an anxious mood state.  She takes Klonopin twice a day and this dose of medicine will not be changed.  Today the patient [facility].  This patient will be seen again in 2 months hopefully in person.

## 2021-08-22 ENCOUNTER — Telehealth (HOSPITAL_COMMUNITY): Payer: Self-pay | Admitting: *Deleted

## 2021-08-22 NOTE — Telephone Encounter (Signed)
This nurse spoke with pt today after she called nurse line 7 times total between yesterday ( after visit with Dr. Casimiro Needle ) and today. Pt is requesting a copy of the AVS from visit yesterday mentioning something about her printer not working. Writer advised pt that they would send it to pt by mail. Pt agrees and verbalizes understanding.

## 2021-08-23 ENCOUNTER — Ambulatory Visit: Payer: Medicare HMO | Admitting: Internal Medicine

## 2021-08-26 ENCOUNTER — Other Ambulatory Visit: Payer: Self-pay | Admitting: Gastroenterology

## 2021-08-27 ENCOUNTER — Telehealth: Payer: Self-pay

## 2021-08-27 NOTE — Telephone Encounter (Signed)
Last OV for preventative care 09/29/19 with several acute visits this year.   Pt notified of above & declines to schedule appt with PCP due to "other issues going on right now". Pt aware that medications will not be refilled until OV with PCP; verb understanding.

## 2021-08-27 NOTE — Telephone Encounter (Signed)
See alternate patient message. 

## 2021-08-28 ENCOUNTER — Telehealth: Payer: Self-pay

## 2021-08-28 DIAGNOSIS — R110 Nausea: Secondary | ICD-10-CM

## 2021-08-28 DIAGNOSIS — R1084 Generalized abdominal pain: Secondary | ICD-10-CM

## 2021-08-28 DIAGNOSIS — K5909 Other constipation: Secondary | ICD-10-CM

## 2021-08-28 NOTE — Telephone Encounter (Signed)
Patient called into the office today with complaints of constipation. Patient states that it has been "weeks" since she has had a bowel movement. Pt reports that she had to "dig out some pieces" today. Patient states that she is "scared to take anything", she states that she takes 2 Dulcolax daily with no relied. She states that the Miralax does nothing. I asked patient if she had any bowel movement when she did the bowel purge that was recommended several weeks ago, her response was "No". Pt states that she eats a regular diet. Patient was under the impression that her follow up appt was an appt for "Dr. Loletha Carrow to dig it out." It was stated to patient multiple times that this is an office visit. Advised that if she feels like she needs a manual disimpaction she would need to go to the hospital. Advised that she can try Fleet enemas OTC to see if that will help. Patient told me "You're no help". Advised that she did not want to follow the recommendations that were provided. Patient thanked me for the call and the call was ended. Per 11/3 my chart message, Linzess did not work for the patient.

## 2021-08-29 ENCOUNTER — Telehealth (HOSPITAL_COMMUNITY): Payer: Self-pay | Admitting: *Deleted

## 2021-08-29 NOTE — Telephone Encounter (Signed)
Pt called into the office today. I spoke with her for 17 minutes. She cancelled her appt on 11/22 prior to speaking with me. She reviewed the my chart messages with recommendations earlier today. She states that she did not see where you wanted her to keep her appt. I had to help patient find the message again on my chart because she states that it disappeared. She found the message and re-read it while I was on the phone with her. She states that she can't come on the 22nd anymore because other appts have come up. Patient's appt is now scheduled for 10/08/21 at 1:20 pm. Pt states that she will get the enemas tomorrow. She states that she will NOT do the bowel purge. She states that she will do laxatives and prune juice. Advised that those methods have not been effective consistently for her that is why you recommended bowel purge and enemas. Pt's daughter has not returned the call as of yet.

## 2021-08-29 NOTE — Telephone Encounter (Signed)
While she has chronic constipation, it remains difficult to clearly define the extent and severity given her memory difficulty.  You are correct, we do not perform manual disimpaction in the office.  As such, I agree with the recommendation for her to take a fleets enema today, and then a bowel purge with a 238 g bottle of MiraLAX and 2 L of Gatorade.  Drink 1 cup every 30 minutes until complete. She may even need to do that regularly to relieve her constipation, further discussion at upcoming office visit.  As before, I think it would be best to communicate these recommendations directly to her daughter if she can be reached.  Will be best to give this to the patient in written form as a portal message.  Lastly, it has been a year since her thyroid function was checked, and we should just be sure that is normal given the severity of her constipation. I would like her to come early to her office visit so she can go to the lab for a TSH and free T4 draw.   HD

## 2021-08-29 NOTE — Telephone Encounter (Signed)
Lm on vm for [place] to return call to discuss recommendations.

## 2021-08-29 NOTE — Telephone Encounter (Signed)
Pt called regarding the Vistaril 50 mg script that writer sent in on 08/21/21 per Dr. Casimiro Needle. Pt stated that she had called [facility] and was told they never received it. Writer spoke with pharmacy and the issue is that insurance will not pay until 09/13/21. Writer advise pt. Pt verbalized that that was ok but then did not remember who I was or from which office I called.

## 2021-08-29 NOTE — Telephone Encounter (Signed)
Noted, thanks!

## 2021-08-29 NOTE — Telephone Encounter (Signed)
My chart message sent to patient with recommendations. Lab orders in epic.

## 2021-08-29 NOTE — Addendum Note (Signed)
Addended by: Yevette Edwards on: 08/29/2021 02:08 PM   Modules accepted: Orders

## 2021-08-29 NOTE — Telephone Encounter (Signed)
Thank you for the note and your ongoing efforts with this patient.  I have no further recommendations at this point.  HD

## 2021-08-30 NOTE — Telephone Encounter (Signed)
Pt's daughter returned call, we reviewed Dr. Loletha Carrow recommendations. Pt's daughter reports that patient thinks she has done the bowel purge several times, but she has only done it for the procedure prep. Pt's daughter thinks that patient may be dealing with this because she has used laxatives for years. Pt's daughter reports that pt will see her PCP soon and will request these labs be sent to Dr. Loletha Carrow. Pt's daughter reports that she has thyroid issues herself. Advised pt's daughter of her follow up appt on 10/08/21 with Dr. Loletha Carrow. Shelby verbalized understanding and had no concerns at the end of the call.

## 2021-08-31 ENCOUNTER — Encounter: Payer: Self-pay | Admitting: Gastroenterology

## 2021-09-02 ENCOUNTER — Telehealth: Payer: Self-pay

## 2021-09-02 NOTE — Telephone Encounter (Signed)
Patient called asking if lab requests could be shared with another doctor and patient was informed she needs to come into the office and  sign a medical release form

## 2021-09-03 ENCOUNTER — Ambulatory Visit: Payer: Medicare HMO | Admitting: Gastroenterology

## 2021-09-11 ENCOUNTER — Ambulatory Visit: Payer: Medicare HMO | Admitting: Gastroenterology

## 2021-09-12 ENCOUNTER — Other Ambulatory Visit: Payer: Self-pay | Admitting: Internal Medicine

## 2021-09-12 ENCOUNTER — Encounter: Payer: Self-pay | Admitting: Internal Medicine

## 2021-09-12 ENCOUNTER — Encounter: Payer: Medicare HMO | Admitting: Internal Medicine

## 2021-09-12 ENCOUNTER — Other Ambulatory Visit (INDEPENDENT_AMBULATORY_CARE_PROVIDER_SITE_OTHER): Payer: Medicare HMO

## 2021-09-12 DIAGNOSIS — R5382 Chronic fatigue, unspecified: Secondary | ICD-10-CM | POA: Diagnosis not present

## 2021-09-12 LAB — TSH: TSH: 1.23 u[IU]/mL (ref 0.35–5.50)

## 2021-09-12 NOTE — Telephone Encounter (Signed)
The patient was going to follow up for her constipation, not specifically to check her thyroid.   She can decide whether or not she wants to come to the visit with me.  - HD

## 2021-09-16 ENCOUNTER — Telehealth: Payer: Self-pay | Admitting: Internal Medicine

## 2021-09-16 NOTE — Telephone Encounter (Signed)
Patient called to ask when did she come in for labs. Informed her that it was 12/1

## 2021-09-17 ENCOUNTER — Other Ambulatory Visit: Payer: Self-pay

## 2021-09-17 ENCOUNTER — Emergency Department (HOSPITAL_COMMUNITY)
Admission: EM | Admit: 2021-09-17 | Discharge: 2021-09-17 | Disposition: A | Discharge status: Home / Self Care | Payer: Medicare HMO | Attending: Physician Assistant | Admitting: Physician Assistant

## 2021-09-17 ENCOUNTER — Encounter (HOSPITAL_COMMUNITY): Payer: Self-pay

## 2021-09-17 ENCOUNTER — Emergency Department (HOSPITAL_COMMUNITY): Payer: Medicare HMO

## 2021-09-17 DIAGNOSIS — R194 Change in bowel habit: Secondary | ICD-10-CM | POA: Diagnosis not present

## 2021-09-17 DIAGNOSIS — R109 Unspecified abdominal pain: Secondary | ICD-10-CM | POA: Insufficient documentation

## 2021-09-17 DIAGNOSIS — K5900 Constipation, unspecified: Secondary | ICD-10-CM

## 2021-09-17 DIAGNOSIS — Z5321 Procedure and treatment not carried out due to patient leaving prior to being seen by health care provider: Secondary | ICD-10-CM | POA: Insufficient documentation

## 2021-09-17 DIAGNOSIS — R69 Illness, unspecified: Secondary | ICD-10-CM | POA: Diagnosis not present

## 2021-09-17 NOTE — ED Triage Notes (Signed)
Patient reports that she has not had a full BM in 4 days. Patient states she takes OTC laxatives and prune juice.  Patient states when she goes to have a BM she gets abdominal pain. Patient denies any rectal pain.

## 2021-09-17 NOTE — ED Provider Notes (Signed)
Emergency Medicine Provider Triage Evaluation Note  Shannon Sutton , a 75 y.o. female  was evaluated in triage.  Pt complains of constipation.  Chart review shows that she has had extensive conversations with GI regarding this.  It appears that they had recommended enemas and a bowel purge. When I asked patient about this today she states "that was deferred" and repeats that she was told to come to the hospital by her GI doctor. She says that she is still having constipation, feels like she is stool that needs to come out.  She denies any rectal pain. She asks me "are they going to anesthetize me if they need to reach up and there and pull poop out." She also informs me that she has not been flushing the toilet so that she can keep track of how much she is passing.   Per note by Dr. Loletha Carrow on 08/28/21 "While she has chronic constipation, it remains difficult to clearly define the extent and severity given her memory difficulty.   You are correct, we do not perform manual disimpaction in the office.  As such, I agree with the recommendation for her to take a fleets enema today, and then a bowel purge with a 238 g bottle of MiraLAX and 2 L of Gatorade.  Drink 1 cup every 30 minutes until complete. She may even need to do that regularly to relieve her constipation, further discussion at upcoming office visit.   As before, I think it would be best to communicate these recommendations directly to her daughter if she can be reached.  Will be best to give this to the patient in written form as a portal message."  Chart review shows that she has been evaluated with colonoscopy and CT scan by GI for this issue.  Review of Systems  Positive: Constipation Negative: Abdominal pain, rectal pain  Physical Exam  BP 97/60 (BP Location: Right Arm)   Pulse 85   Temp 98 F (36.7 C) (Oral)   Resp 17   Ht 5' (1.524 m)   Wt 52.6 kg   SpO2 96%   BMI 22.65 kg/m  Gen:   Awake, no distress   Resp:  Normal  effort  MSK:   Moves extremities without difficulty  Other:  Abd non tender  Medical Decision Making  Medically screening exam initiated at 3:45 PM.  Appropriate orders placed.  Shannon Sutton was informed that the remainder of the evaluation will be completed by another provider, this initial triage assessment does not replace that evaluation, and the importance of remaining in the ED until their evaluation is complete.  Patient presents today for chronic constipation. On chart review it appears that she has been in extensive contact with GI about this.  It also appears that she is declining the recommended bowel purge and enemas based on patient's report and chart review.  But this appears to be a chronic issue we will not obtain labs.  Will obtain 1 view KUB from triage to evaluate degree of stool burden.  Note: Portions of this report may have been transcribed using voice recognition software. Every effort was made to ensure accuracy; however, inadvertent computerized transcription errors may be present    Ollen Gross 09/17/21 1550    Carmin Muskrat, MD 09/17/21 2238

## 2021-09-19 ENCOUNTER — Telehealth (HOSPITAL_COMMUNITY): Payer: Self-pay | Admitting: *Deleted

## 2021-09-19 NOTE — Telephone Encounter (Signed)
Pt just called again but didn't know why she called. Pt asked several times if I know Dr. Casimiro Needle and writer explained that I am a nurse that works with Dr. Casimiro Needle and see pt when she comes in office. Then Raissa began asking about the ED again. Pt asked again about when her next appointment is scheduled. Writer reiterated that it is scheduled for 10/22/21 @ 1430.Probation officer reinforced that she is to called [facility] to set up therapy appointment.

## 2021-09-19 NOTE — Telephone Encounter (Signed)
Pt called asking for "what to do with [facility]", which Dr.Plovsky referred her to for therapy. Pt did not remember writer or where or why she called. Pt asked writer if I know Dr. Casimiro Needle. I reminded her she called about [facility] when pt began talking about going to the ED. Writer asked if pt was in crisis and she responded no. Pt was rambling and tangential. Writer explained several times who she would be calling and why. We repeated this several more times. Pt stated tht she would call this nurse back with any questions.

## 2021-09-24 ENCOUNTER — Other Ambulatory Visit: Payer: Self-pay

## 2021-09-24 ENCOUNTER — Telehealth (HOSPITAL_COMMUNITY): Payer: Self-pay | Admitting: *Deleted

## 2021-09-24 ENCOUNTER — Ambulatory Visit (INDEPENDENT_AMBULATORY_CARE_PROVIDER_SITE_OTHER)
Admission: EM | Admit: 2021-09-24 | Discharge: 2021-09-24 | Disposition: A | Discharge status: Other Acute Inpatient Hospital | Payer: Medicare HMO | Source: Home / Self Care

## 2021-09-24 ENCOUNTER — Inpatient Hospital Stay (HOSPITAL_COMMUNITY)
Admission: EM | Admit: 2021-09-24 | Discharge: 2021-09-27 | DRG: 689 | Disposition: A | Discharge status: Home / Self Care | Payer: Medicare HMO | Attending: Internal Medicine | Admitting: Internal Medicine

## 2021-09-24 DIAGNOSIS — E7800 Pure hypercholesterolemia, unspecified: Secondary | ICD-10-CM | POA: Diagnosis present

## 2021-09-24 DIAGNOSIS — G20 Parkinson's disease: Secondary | ICD-10-CM | POA: Insufficient documentation

## 2021-09-24 DIAGNOSIS — G9340 Encephalopathy, unspecified: Secondary | ICD-10-CM | POA: Diagnosis not present

## 2021-09-24 DIAGNOSIS — E785 Hyperlipidemia, unspecified: Secondary | ICD-10-CM | POA: Diagnosis present

## 2021-09-24 DIAGNOSIS — R45851 Suicidal ideations: Secondary | ICD-10-CM | POA: Insufficient documentation

## 2021-09-24 DIAGNOSIS — F4325 Adjustment disorder with mixed disturbance of emotions and conduct: Secondary | ICD-10-CM

## 2021-09-24 DIAGNOSIS — R69 Illness, unspecified: Secondary | ICD-10-CM | POA: Diagnosis not present

## 2021-09-24 DIAGNOSIS — Z8249 Family history of ischemic heart disease and other diseases of the circulatory system: Secondary | ICD-10-CM

## 2021-09-24 DIAGNOSIS — Z90710 Acquired absence of both cervix and uterus: Secondary | ICD-10-CM

## 2021-09-24 DIAGNOSIS — Z79899 Other long term (current) drug therapy: Secondary | ICD-10-CM

## 2021-09-24 DIAGNOSIS — Z888 Allergy status to other drugs, medicaments and biological substances status: Secondary | ICD-10-CM

## 2021-09-24 DIAGNOSIS — K5909 Other constipation: Secondary | ICD-10-CM | POA: Diagnosis present

## 2021-09-24 DIAGNOSIS — I10 Essential (primary) hypertension: Secondary | ICD-10-CM | POA: Diagnosis present

## 2021-09-24 DIAGNOSIS — N390 Urinary tract infection, site not specified: Secondary | ICD-10-CM | POA: Diagnosis not present

## 2021-09-24 DIAGNOSIS — R4182 Altered mental status, unspecified: Secondary | ICD-10-CM | POA: Insufficient documentation

## 2021-09-24 DIAGNOSIS — Z87891 Personal history of nicotine dependence: Secondary | ICD-10-CM

## 2021-09-24 DIAGNOSIS — F02811 Dementia in other diseases classified elsewhere, unspecified severity, with agitation: Secondary | ICD-10-CM | POA: Diagnosis present

## 2021-09-24 DIAGNOSIS — F329 Major depressive disorder, single episode, unspecified: Secondary | ICD-10-CM | POA: Diagnosis present

## 2021-09-24 DIAGNOSIS — G2581 Restless legs syndrome: Secondary | ICD-10-CM | POA: Diagnosis present

## 2021-09-24 DIAGNOSIS — B9620 Unspecified Escherichia coli [E. coli] as the cause of diseases classified elsewhere: Secondary | ICD-10-CM | POA: Diagnosis present

## 2021-09-24 DIAGNOSIS — Z20822 Contact with and (suspected) exposure to covid-19: Secondary | ICD-10-CM | POA: Diagnosis present

## 2021-09-24 DIAGNOSIS — G4700 Insomnia, unspecified: Secondary | ICD-10-CM | POA: Diagnosis present

## 2021-09-24 DIAGNOSIS — F03911 Unspecified dementia, unspecified severity, with agitation: Secondary | ICD-10-CM

## 2021-09-24 DIAGNOSIS — G9341 Metabolic encephalopathy: Secondary | ICD-10-CM | POA: Diagnosis present

## 2021-09-24 DIAGNOSIS — Z820 Family history of epilepsy and other diseases of the nervous system: Secondary | ICD-10-CM

## 2021-09-24 DIAGNOSIS — Z885 Allergy status to narcotic agent status: Secondary | ICD-10-CM

## 2021-09-24 NOTE — Progress Notes (Signed)
°   09/24/21 2110  [facility] Triage Screening (Walk-ins at [facility] only)  How Did You Hear About [place]? Family/Friend  What Is the Reason for Your Visit/Call Today? Patient arrived voluntarily with daughter. Patient is confused, per daughter's report, she is referencing the "the electricity" in her house, saying that "the lights are connected to electricity" but cannot elaborate further. Patients daughter said that the electrcity referene just started this morning. Pt's daughter also reported that pt has becoming increasingly confused the past couple of days. Daughter reports SI saying last Sunday patient reported that she would "killed herself it would be her daughters fault." Per daughter pt has dementia and parkinsons, general cognitive decline. Patient has psychiatrist and OP through [facility] health. Denied HI, Denided alcohol and substance use. Pt denied AVH, pt was unable to elaborate or explain.  How Long Has This Been Causing You Problems? <Week  Have You Recently Had Any Thoughts About Hurting Yourself? Yes  How long ago did you have thoughts about hurting yourself? Per daughter reports, last Sunday  Are You Planning to Commit Suicide/Harm Yourself At This time? No  Have you Recently Had Thoughts About [place]? No  Are You Planning To Harm Someone At This Time? No  Are you currently experiencing any auditory, visual or other hallucinations? No (Pt reports no, daughter could not confirm.)  Have You Used Any Alcohol or Drugs in the Past 24 Hours? No  Do you have any current medical co-morbidities that require immediate attention? No (Pt's mother does report she has parkinsons and does have her medication with her.)  Clinician description of patient physical appearance/behavior: Pt was casually dressed and adequately groomed. Pt was somewhat uncooperative, pt was confused. Pt's speech was somewhat slurred,tangential, movement within normal limit. Thought content was confused. Pt's mood was  irritable, somewhat flat. Affect was congruent with mood. Pt was oriented to time, self, but not place, or situation.  What Do You Feel Would Help You the Most Today? Treatment for Depression or other mood problem  If access to [facility] was not available, would you have sought care in the Emergency Department? Yes  Determination of Need Emergent (2 hours)  Options For Referral ED Referral;Inpatient Hospitalization

## 2021-09-24 NOTE — BH Assessment (Addendum)
Comprehensive Clinical Assessment (CCA) Note  09/24/2021 Shannon Sutton [id number]  Chief Complaint:  Chief Complaint  Patient presents with   Altered Mental Status   Visit Diagnosis:  Altered Mental Status  Disposition:  Per Lindon Romp NP pt to be transported to ED for further assessment and stabilization   CCA Screening, Triage and Referral (STR)  Patient Reported Information How did you hear about [place]? Family/Friend  What Is the Reason for Your Visit/Call Today? Patient arrived voluntarily with daughter. Patient is confused, per daughter's report, she is referencing the "the electricity" in her house, saying that "the lights are connected to electricity" but cannot elaborate further. Patients daughter said that the electrcity referene just started this morning. Pt's daughter also reported that pt has becoming increasingly confused the past couple of days. Daughter reports SI saying last Sunday patient reported that she would "killed herself it would be her daughters fault." Per daughter pt has dementia and parkinsons, general cognitive decline. Patient has psychiatrist and OP through [facility] health. Denied HI, Denided alcohol and substance use. Pt denied AVH, pt was unable to elaborate or explain.  How Long Has This Been Causing You Problems? <Week  What Do You Feel Would Help You the Most Today? Treatment for Depression or other mood problem   Have You Recently Had Any Thoughts About Hurting Yourself? Yes  Are You Planning to Commit Suicide/Harm Yourself At This time? No   Have you Recently Had Thoughts About [place]? No  Are You Planning to Harm Someone at This Time? No  Explanation: No data recorded  Have You Used Any Alcohol or Drugs in the Past 24 Hours? No  How Long Ago Did You Use Drugs or Alcohol? No data recorded What Did You Use and How Much? No data recorded  Do You Currently Have a Therapist/Psychiatrist? Yes  Name of Therapist/Psychiatrist: Dr.  Casimiro Needle   Have You Been Recently Discharged From Any Office Practice or Programs? No  Explanation of Discharge From Practice/Program: No data recorded    CCA Screening Triage Referral Assessment Type of Contact: Face-to-Face  Telemedicine Service Delivery:   Is this Initial or Reassessment? No data recorded Date Telepsych consult ordered in [facility]:  No data recorded Time Telepsych consult ordered in [facility]:  No data recorded Location of Assessment: [facility] Assessment Services  Provider Location: [facility] Assessment Services   Collateral Involvement: Pts daughter is present throughout assessment   Does Patient Have a [place]? No data recorded Name and Contact of Legal Guardian: No data recorded If Minor and Not Living with Parent(s), Who has Custody? No data recorded Is CPS involved or ever been involved? Never  Is APS involved or ever been involved? Never   Patient Determined To Be At Risk for Harm To Self or Others Based on Review of Patient Reported Information or Presenting Complaint? Yes, for Self-Harm (Pts daughter states she does not feel safe to allow her mother to return home)  Method: No data recorded Availability of Means: No data recorded Intent: No data recorded Notification Required: No data recorded Additional Information for Danger to Others Potential: No data recorded Additional Comments for Danger to Others Potential: No data recorded Are There Guns or Other Weapons in Your Home? No data recorded Types of Guns/Weapons: No data recorded Are These Weapons Safely Secured?  No data recorded Who Could Verify You Are Able To Have These Secured: No data recorded Do You Have any Outstanding Charges, Pending Court Dates, Parole/Probation? No data recorded Contacted To Inform of Risk of Harm To Self or Others: No data recorded   Does Patient Present under Involuntary Commitment? No  IVC Papers Initial File Date: No data  recorded  [place] of Residence: [place]   Patient Currently Receiving the Following Services: Medication Management   Determination of Need: Emergent (2 hours)   Options For Referral: ED Referral; Inpatient Hospitalization     CCA Biopsychosocial Patient Reported Schizophrenia/Schizoaffective Diagnosis in Past: No   Strengths: Pt has strong family supports   Mental Health Symptoms Depression:   Sleep (too much or little); Increase/decrease in appetite; Irritability (decreased appetite and sleep)   Duration of Depressive symptoms:  Duration of Depressive Symptoms: Greater than two weeks   Mania:   None; Irritability   Anxiety:    Irritability; Sleep   Psychosis:   None   Duration of Psychotic symptoms:    Trauma:   None   Obsessions:   None   Compulsions:   None   Inattention:   Forgetful   Hyperactivity/Impulsivity:   None   Oppositional/Defiant Behaviors:   Argumentative   Emotional Irregularity:   Mood lability   Other Mood/Personality Symptoms:  No data recorded   Mental Status Exam Appearance and self-care  Stature:   Average   Weight:   Average weight   Clothing:   Neat/clean   Grooming:   Normal   Cosmetic use:   None   Posture/gait:   Tense   Motor activity:   Not Remarkable   Sensorium  Attention:   Distractible; Confused   Concentration:   Scattered; Variable   Orientation:   Person; Object; Place   Recall/memory:   -- (Needs further assessment)   Affect and Mood  Affect:   Anxious; Blunted; Depressed   Mood:   Depressed; Irritable; Negative   Relating  Eye contact:   Staring   Facial expression:   Anxious; Tense   Attitude toward examiner:   Defensive; Guarded; Irritable; Resistant   Thought and Language  Speech flow:  Slurred; Paucity   Thought content:   Suspicious   Preoccupation:   Other (Comment)   Hallucinations:   None   Organization:  No data recorded  [employer] of Knowledge:   Impoverished by (Comment) (memory loss)   Intelligence:   Average   Abstraction:  No data recorded  Judgement:   Dangerous; Impaired   Reality Testing:   Distorted   Insight:   Gaps; Poor; Unaware; Uses connections   Decision Making:   Confused; Impulsive   Social Functioning  Social Maturity:   Impulsive; Irresponsible   Social Judgement:   Heedless; Impropriety   Stress  Stressors:   Illness   Coping Ability:   Deficient supports   Skill Deficits:   Responsibility   Supports:   Family     Religion: Religion/Spirituality Are You A Religious Person?:  Special educational needs teacher)  Leisure/Recreation: Leisure / Recreation Do You Have Hobbies?:  (UTA)  Exercise/Diet: Exercise/Diet Do You Exercise?:  (UTA) Have You Gained or Lost A Significant Amount of Weight in the Past Six Months?: No Do You Follow a Special Diet?:  (UTA) Do You Have Any Trouble Sleeping?: Yes Explanation of Sleeping Difficulties: pts daughter reports that pt has frequent insomnia--rarely sleeps more than 2 hours per night   CCA Employment/Education Employment/Work Situation:  Employment / Work Situation Employment Situation: Retired Social research officer, 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 Current Illness:  (UTA) Has Patient ever Been in the [employer]?:  Special educational needs teacher)  Education: Education Is Patient Currently Attending School?:  (UTA) Last Grade Completed:  (UTA) Did You Attend College?:  (UTA) Did You Have An Individualized Education Program (IIEP):  (UTA) Did You Have Any Difficulty At School?:  (UTA) Patient's Education Has Been Impacted by Current Illness:  (UTA)   CCA Family/Childhood History Family and Relationship History: Family history Marital status:  (UTA) Does patient have children?: Yes How many children?: 2 How is patient's relationship with their children?: pts daughter is very supportive and accompanies pt to her assessment  Childhood History:  Childhood History By whom  was/is the patient raised?:  (UTA) Did patient suffer any verbal/emotional/physical/sexual abuse as a child?:  (UTA) Did patient suffer from severe childhood neglect?:  (UTA) Has patient ever been sexually abused/assaulted/raped as an adolescent or adult?:  (UTA) Was the patient ever a victim of a crime or a disaster?:  (UTA) Witnessed domestic violence?:  (UTA) Has patient been affected by domestic violence as an adult?:  Special educational needs teacher)  Child/Adolescent Assessment:  none   CCA Substance Use Alcohol/Drug Use: Alcohol / Drug Use Pain Medications: SEE MAR Prescriptions: SEE MAR Over the Counter: SEE MAR History of alcohol / drug use?:  (UTA) Longest period of sobriety (when/how long): UTA Negative Consequences of Use:  (UTA) Withdrawal Symptoms:  (UTA)     ASAM's:  Six Dimensions of Multidimensional Assessment  Dimension 1:  Acute Intoxication and/or Withdrawal Potential:      Dimension 2:  Biomedical Conditions and Complications:      Dimension 3:  Emotional, Behavioral, or Cognitive Conditions and Complications:     Dimension 4:  Readiness to Change:     Dimension 5:  Relapse, Continued use, or Continued Problem Potential:     Dimension 6:  Recovery/Living Environment:     ASAM Severity Score:    ASAM Recommended Level of Treatment:     Substance use Disorder (SUD) Substance Use Disorder (SUD)  Checklist Symptoms of Substance Use:  (UTA)  Recommendations for Services/Supports/Treatments: Recommendations for Services/Supports/Treatments Recommendations For Services/Supports/Treatments: Other (Comment) (transfer to ED for stabilization and further assessment)  Discharge Disposition: Discharge Disposition Medical Exam completed: Yes Disposition of Patient: Movement to [facility] or [facility] ED  DSM5 Diagnoses: Patient Active Problem List   Diagnosis Date Noted   Vitamin D deficiency 08/15/2020   Ataxia 11/23/2017   Dizzinesses 05/18/2017   Constipation 05/29/2016   Fatigue 08/30/2014    Generalized headaches 08/30/2014   Dyslipidemia 12/15/2013   Restless leg syndrome 12/15/2013   Anxiety 12/15/2013   Fibromyalgia with myofascial pain 11/16/2012   Insomnia 11/16/2012   Depression 11/16/2012     Referrals to Alternative Service(s): Referred to Alternative Service(s):   Place:   Date:   Time:    Referred to Alternative Service(s):   Place:   Date:   Time:    Referred to Alternative Service(s):   Place:   Date:   Time:    Referred to Alternative Service(s):   Place:   Date:   Time:     Rachel Bo Temitope Griffing, LCSW

## 2021-09-24 NOTE — Telephone Encounter (Signed)
Pt called leaving a partial message with slurry speech and asked "what did Dr. Casimiro Needle want me to do", then hung up. Pt doesn't pick up her phone on return call f/u. Will try again tomorrow. This nurse considering calling pt daughter, shelby< to make her aware of all the rambling confused messages that pt frequently leaves at this office.

## 2021-09-24 NOTE — Discharge Instructions (Signed)
Transfer to [facility] ED for medical clearance

## 2021-09-24 NOTE — ED Provider Notes (Signed)
Behavioral Health Urgent Care Medical Screening Exam  Patient Name: Shannon Sutton MRN: [id number] Date of Evaluation: 09/24/21 Chief Complaint:   Diagnosis:  Final diagnoses:  Adjustment disorder with mixed disturbance of emotions and conduct    History of Present illness: Shannon Sutton is a 75 y.o. female with a history of parkinson's disease, depression, and dementia who presents to [facility] voluntarily. On evaluation patient is alert and oriented to person and place. She recalls the correct day but not the date. She is unable to recall the current and previous president, but recalls President Obama. Patient remains calm with this provider. However, staff reports that the patient has been agitated. Overhead patient yelling at staff at one point. Her speech is clear and coherent. Mood is depressed and affect is congruent with mood. Thought process is coherent and thought content is logical. Denies auditory and visual hallucinations. No indication that patient is responding to internal stimuli. No evidence of delusional thought content. Reports passive suicidal ideations. Denies suicidal intent/plan. States that she would never attempt to harm herself. Patient's daughter denies any concerns for the patient intentionally harming herself. Patient's daughter states that she does not feel safe with the patient returning home due to the worsening confusion and agitation. Patient denies homicidal ideations.   Due to the patient's age and medical history, recommend transfer to the ED for medical clearance and reevaluation by psychiatry in the morning.   Patient and her daughter are in agreement with transfer to the ED.   Discussed patient with Dr. Zenia Resides at [facility] ED who has agreed to accept the patient.    Triage Note: Patient arrived voluntarily with daughter. Patient is confused, per daughter's report, she is referencing the "the electricity" in her house, saying that "the lights are connected to  electricity" but cannot elaborate further. Patients daughter said that the electrcity referene just started this morning. Pt's daughter also reported that pt has becoming increasingly confused the past couple of days. Daughter reports SI saying last Sunday patient reported that she would "killed herself it would be her daughters fault." Per daughter pt has dementia and parkinsons, general cognitive decline. Patient has psychiatrist and OP through [facility] health. Denied HI, Denided alcohol and substance use. Pt denied AVH, pt was unable to elaborate or explain.  Psychiatric Specialty Exam  Presentation  General Appearance:Appropriate for Environment; Casual  Eye Contact:Fair  Speech:Clear and Coherent; Normal Rate  Speech Volume:Normal  Handedness:No data recorded  Mood and Affect  Mood:Depressed; Anxious  Affect:Congruent   Thought Process  Thought Processes:Coherent  Descriptions of Associations:Intact  Orientation:Partial  Thought Content:Logical  Diagnosis of Schizophrenia or Schizoaffective disorder in past: No   Hallucinations:None  Ideas of Reference:None  Suicidal Thoughts:Yes, Passive Without Intent; Without Plan  Homicidal Thoughts:No   Sensorium  Memory:Immediate Fair; Recent Fair; Remote Fair  Judgment:Impaired  Insight:Present   Executive Functions  Concentration:Fair  Attention Span:Fair  [place]  Language:Good   Psychomotor Activity  Psychomotor Activity:Normal   Assets  Assets:Desire for Improvement; Financial Resources/Insurance; Housing; Social Support   Sleep  Sleep:Fair  Number of hours: No data recorded  No data recorded  Physical Exam: Physical Exam Constitutional:      General: She is not in acute distress.    Appearance: She is not ill-appearing, toxic-appearing or diaphoretic.  HENT:     Head: Normocephalic.     Right Ear: External ear normal.     Left Ear: External ear normal.  Eyes:  Conjunctiva/sclera: Conjunctivae normal.     Pupils: Pupils are equal, round, and reactive to light.  Cardiovascular:     Rate and Rhythm: Normal rate.  Pulmonary:     Effort: Pulmonary effort is normal. No respiratory distress.  Musculoskeletal:        General: Normal range of motion.  Skin:    General: Skin is warm and dry.  Neurological:     Mental Status: She is alert and oriented to person, place, and time.  Psychiatric:        Mood and Affect: Mood is anxious and depressed.        Thought Content: Thought content is not paranoid or delusional. Thought content does not include homicidal or suicidal ideation.   Review of Systems  Constitutional:  Negative for chills, diaphoresis, fever, malaise/fatigue and weight loss.  HENT:  Negative for congestion.   Respiratory:  Negative for cough and shortness of breath.   Cardiovascular:  Negative for chest pain and palpitations.  Gastrointestinal:  Negative for diarrhea, nausea and vomiting.  Neurological:  Negative for dizziness and seizures.  Psychiatric/Behavioral:  Positive for depression, memory loss and suicidal ideas. Negative for hallucinations and substance abuse. The patient is nervous/anxious and has insomnia.   All other systems reviewed and are negative.  Blood pressure 124/76, pulse 92, temperature 98.6 F (37 C), resp. rate 16, weight 120 lb (54.4 kg), SpO2 100 %. Body mass index is 23.44 kg/m.  Musculoskeletal: Strength & Muscle Tone: within normal limits Gait & Station: normal Patient leans: N/A   [facility] MSE Discharge Disposition for Follow up and Recommendations: Due to the patient's age and medical history, recommend transfer to the ED for medical clearance and reevaluation by psychiatry in the morning.   Patient and her daughter are in agreement with transfer to the ED.   Discussed patient with Dr. Zenia Resides at [facility] ED who has agreed to accept the patient.   Nursing staff to arrange transportation.  Requested staff member to ride with patient to the ED.    Rozetta Nunnery, NP 09/24/2021, 10:48 PM

## 2021-09-24 NOTE — ED Provider Notes (Signed)
Emergency Medicine Provider Triage Evaluation Note  Shannon Sutton , a 75 y.o. female  was evaluated in triage.  Pt complains of depression, increased agitation.  History of dementia, Parkinson's, depression.  Per family worsening confusion.  Was seen at [facility] earlier today, recommended come here for medical clearance, psychiatry reassessment in the morning.  No recent falls, injuries, pain  Here with family in room Review of Systems  Positive: Increased agitation Negative:   Physical Exam  BP (!) 165/86    Pulse 78    Temp 98 F (36.7 C)    Resp 17    SpO2 98%  Gen:   Awake, no distress   Resp:  Normal effort  MSK:   Moves extremities without difficulty  Other:    Medical Decision Making  Medically screening exam initiated at 11:55 PM.  Appropriate orders placed.  JAMIESON LISA was informed that the remainder of the evaluation will be completed by another provider, this initial triage assessment does not replace that evaluation, and the importance of remaining in the ED until their evaluation is complete.  History of dementia, increased agitation, confusion  Plan on labs, urine to look for infectious cause of increased confusion   Majour Frei A, PA-C 09/24/21 2357    Veryl Speak, MD 09/25/21 [phone]

## 2021-09-24 NOTE — ED Notes (Signed)
Safe transport called to transport pt to [place] ed

## 2021-09-24 NOTE — ED Notes (Signed)
[facility] given to [place] [email or web address]  [place]

## 2021-09-25 ENCOUNTER — Encounter (HOSPITAL_COMMUNITY): Payer: Self-pay

## 2021-09-25 ENCOUNTER — Other Ambulatory Visit: Payer: Self-pay

## 2021-09-25 ENCOUNTER — Ambulatory Visit: Payer: Medicare HMO | Admitting: Diagnostic Neuroimaging

## 2021-09-25 DIAGNOSIS — G9340 Encephalopathy, unspecified: Secondary | ICD-10-CM

## 2021-09-25 LAB — CBC WITH DIFFERENTIAL/PLATELET
Abs Immature Granulocytes: 0.02 10*3/uL (ref 0.00–0.07)
Basophils Absolute: 0.1 10*3/uL (ref 0.0–0.1)
Basophils Relative: 1 %
Eosinophils Absolute: 0.4 10*3/uL (ref 0.0–0.5)
Eosinophils Relative: 5 %
HCT: 39 % (ref 36.0–46.0)
Hemoglobin: 13.1 g/dL (ref 12.0–15.0)
Immature Granulocytes: 0 %
Lymphocytes Relative: 33 %
Lymphs Abs: 2.2 10*3/uL (ref 0.7–4.0)
MCH: 32.1 pg (ref 26.0–34.0)
MCHC: 33.6 g/dL (ref 30.0–36.0)
MCV: 95.6 fL (ref 80.0–100.0)
Monocytes Absolute: 0.7 10*3/uL (ref 0.1–1.0)
Monocytes Relative: 10 %
Neutro Abs: 3.5 10*3/uL (ref 1.7–7.7)
Neutrophils Relative %: 51 %
Platelets: 233 10*3/uL (ref 150–400)
RBC: 4.08 MIL/uL (ref 3.87–5.11)
RDW: 12.3 % (ref 11.5–15.5)
WBC: 6.9 10*3/uL (ref 4.0–10.5)
nRBC: 0 % (ref 0.0–0.2)

## 2021-09-25 LAB — URINALYSIS, ROUTINE W REFLEX MICROSCOPIC
Bilirubin Urine: NEGATIVE
Glucose, UA: NEGATIVE mg/dL
Hgb urine dipstick: NEGATIVE
Ketones, ur: NEGATIVE mg/dL
Nitrite: POSITIVE — AB
Protein, ur: NEGATIVE mg/dL
Specific Gravity, Urine: 1.015 (ref 1.005–1.030)
pH: 7.5 (ref 5.0–8.0)

## 2021-09-25 LAB — RESP PANEL BY RT-PCR (FLU A&B, COVID) ARPGX2
Influenza A by PCR: NEGATIVE
Influenza B by PCR: NEGATIVE
SARS Coronavirus 2 by RT PCR: NEGATIVE

## 2021-09-25 LAB — COMPREHENSIVE METABOLIC PANEL
ALT: 7 U/L (ref 0–44)
AST: 17 U/L (ref 15–41)
Albumin: 4 g/dL (ref 3.5–5.0)
Alkaline Phosphatase: 71 U/L (ref 38–126)
Anion gap: 6 (ref 5–15)
BUN: 9 mg/dL (ref 8–23)
CO2: 27 mmol/L (ref 22–32)
Calcium: 9.1 mg/dL (ref 8.9–10.3)
Chloride: 106 mmol/L (ref 98–111)
Creatinine, Ser: 0.64 mg/dL (ref 0.44–1.00)
GFR, Estimated: >60 mL/min (ref >60)
Glucose, Bld: 130 mg/dL — ABNORMAL HIGH (ref 70–99)
Potassium: 3.5 mmol/L (ref 3.5–5.1)
Sodium: 139 mmol/L (ref 135–145)
Total Bilirubin: 0.4 mg/dL (ref 0.3–1.2)
Total Protein: 6.5 g/dL (ref 6.5–8.1)

## 2021-09-25 LAB — RAPID URINE DRUG SCREEN, HOSP PERFORMED
Amphetamines: NOT DETECTED
Barbiturates: NOT DETECTED
Benzodiazepines: NOT DETECTED
Cocaine: NOT DETECTED
Opiates: NOT DETECTED
Tetrahydrocannabinol: NOT DETECTED

## 2021-09-25 LAB — SALICYLATE LEVEL: Salicylate Lvl: <7 mg/dL — ABNORMAL LOW (ref 7.0–30.0)

## 2021-09-25 LAB — TSH: TSH: 2.561 u[IU]/mL (ref 0.350–4.500)

## 2021-09-25 LAB — VITAMIN B12: Vitamin B-12: 503 pg/mL (ref 180–914)

## 2021-09-25 LAB — URINALYSIS, MICROSCOPIC (REFLEX): WBC, UA: >50 WBC/hpf (ref 0–5)

## 2021-09-25 LAB — ETHANOL: Alcohol, Ethyl (B): <10 mg/dL (ref <10)

## 2021-09-25 LAB — ACETAMINOPHEN LEVEL: Acetaminophen (Tylenol), Serum: <10 ug/mL — ABNORMAL LOW (ref 10–30)

## 2021-09-25 MED ORDER — CYCLOBENZAPRINE HCL 10 MG PO TABS: 5.0000 mg | ORAL_TABLET | ORAL | Status: DC | PRN

## 2021-09-25 MED ORDER — DONEPEZIL HCL 5 MG PO TABS
5.0000 mg | ORAL_TABLET | Freq: Every day | ORAL | Status: DC
  Administered 2021-09-25 – 2021-09-26 (×2): 5 mg via ORAL
  Filled 2021-09-25 (×3): qty 1

## 2021-09-25 MED ORDER — POLYETHYLENE GLYCOL 3350 17 G PO PACK
17.0000 g | PACK | Freq: Two times a day (BID) | ORAL | Status: DC
  Administered 2021-09-25 – 2021-09-27 (×5): 17 g via ORAL
  Filled 2021-09-25 (×5): qty 1

## 2021-09-25 MED ORDER — PANTOPRAZOLE SODIUM 40 MG PO TBEC: 40.0000 mg | DELAYED_RELEASE_TABLET | Freq: Every day | ORAL | Status: DC

## 2021-09-25 MED ORDER — HYDROXYZINE HCL 50 MG PO TABS
50.0000 mg | ORAL_TABLET | Freq: Every day | ORAL | Status: DC
  Administered 2021-09-25 – 2021-09-26 (×2): 50 mg via ORAL
  Filled 2021-09-25 (×3): qty 1

## 2021-09-25 MED ORDER — LINACLOTIDE 145 MCG PO CAPS: 145.0000 ug | ORAL_CAPSULE | Freq: Every day | ORAL | Status: DC

## 2021-09-25 MED ORDER — TRAZODONE HCL 100 MG PO TABS
200.0000 mg | ORAL_TABLET | Freq: Every day | ORAL | Status: DC
  Administered 2021-09-25 – 2021-09-26 (×2): 200 mg via ORAL
  Filled 2021-09-25 (×2): qty 2

## 2021-09-25 MED ORDER — CARVEDILOL 3.125 MG PO TABS
3.1250 mg | ORAL_TABLET | Freq: Two times a day (BID) | ORAL | Status: DC
  Administered 2021-09-26 – 2021-09-27 (×3): 3.125 mg via ORAL
  Filled 2021-09-25 (×5): qty 1

## 2021-09-25 MED ORDER — SALINE SPRAY 0.65 % NA SOLN
1.0000 | NASAL | Status: DC | PRN
  Administered 2021-09-25 – 2021-09-26 (×3): 1 via NASAL
  Filled 2021-09-25: qty 44

## 2021-09-25 MED ORDER — ENOXAPARIN SODIUM 40 MG/0.4ML IJ SOSY
40.0000 mg | PREFILLED_SYRINGE | INTRAMUSCULAR | Status: DC
  Administered 2021-09-25 – 2021-09-26 (×2): 40 mg via SUBCUTANEOUS
  Filled 2021-09-25 (×2): qty 0.4

## 2021-09-25 MED ORDER — FLUTICASONE PROPIONATE 50 MCG/ACT NA SUSP
2.0000 | Freq: Every day | NASAL | Status: DC
Start: 2021-09-25 — End: 2021-09-27
  Administered 2021-09-25 – 2021-09-27 (×3): 2 via NASAL
  Filled 2021-09-25 (×4): qty 16

## 2021-09-25 MED ORDER — MELATONIN 5 MG PO TABS
5.0000 mg | ORAL_TABLET | Freq: Every day | ORAL | Status: DC
  Administered 2021-09-25 – 2021-09-26 (×2): 5 mg via ORAL
  Filled 2021-09-25 (×2): qty 1

## 2021-09-25 MED ORDER — AMLODIPINE BESYLATE 5 MG PO TABS: 5.0000 mg | ORAL_TABLET | Freq: Every day | ORAL | Status: DC

## 2021-09-25 MED ORDER — SODIUM CHLORIDE 0.9 % IV SOLN
1.0000 g | INTRAVENOUS | Status: DC
  Administered 2021-09-26: 1 g via INTRAVENOUS
  Filled 2021-09-25: qty 10

## 2021-09-25 MED ORDER — CLONAZEPAM 0.5 MG PO TABS
1.0000 mg | ORAL_TABLET | Freq: Two times a day (BID) | ORAL | Status: DC
  Administered 2021-09-25 – 2021-09-27 (×5): 1 mg via ORAL
  Filled 2021-09-25 (×5): qty 2

## 2021-09-25 MED ORDER — MIRTAZAPINE 30 MG PO TABS
30.0000 mg | ORAL_TABLET | Freq: Every day | ORAL | Status: DC
  Administered 2021-09-25 – 2021-09-26 (×2): 30 mg via ORAL
  Filled 2021-09-25 (×3): qty 1

## 2021-09-25 MED ORDER — CYCLOSPORINE 0.05 % OP EMUL: 1.0000 [drp] | Freq: Two times a day (BID) | OPHTHALMIC | Status: DC

## 2021-09-25 MED ORDER — SODIUM CHLORIDE 0.9 % IV SOLN
1.0000 g | Freq: Once | INTRAVENOUS | Status: AC
  Administered 2021-09-25: 12:00:00 1 g via INTRAVENOUS
  Filled 2021-09-25: qty 10

## 2021-09-25 MED ORDER — PROPRANOLOL HCL 20 MG PO TABS: 20.0000 mg | ORAL_TABLET | Freq: Two times a day (BID) | ORAL | Status: DC

## 2021-09-25 MED ORDER — ROPINIROLE HCL 1 MG PO TABS
1.0000 mg | ORAL_TABLET | Freq: Three times a day (TID) | ORAL | Status: DC
  Administered 2021-09-25 – 2021-09-27 (×7): 1 mg via ORAL
  Filled 2021-09-25 (×7): qty 1

## 2021-09-25 MED ORDER — CARBIDOPA-LEVODOPA 25-100 MG PO TABS
1.0000 | ORAL_TABLET | Freq: Four times a day (QID) | ORAL | Status: DC
  Administered 2021-09-25 – 2021-09-27 (×9): 1 via ORAL
  Filled 2021-09-25 (×10): qty 1

## 2021-09-25 MED ORDER — ONDANSETRON HCL 4 MG PO TABS: 4.0000 mg | ORAL_TABLET | Freq: Three times a day (TID) | ORAL | Status: DC | PRN

## 2021-09-25 MED ORDER — ATORVASTATIN CALCIUM 40 MG PO TABS
40.0000 mg | ORAL_TABLET | Freq: Every day | ORAL | Status: DC
  Administered 2021-09-25 – 2021-09-27 (×3): 40 mg via ORAL
  Filled 2021-09-25 (×3): qty 1

## 2021-09-25 MED ORDER — SODIUM CHLORIDE 0.9 % IV SOLN: INTRAVENOUS | Status: AC

## 2021-09-25 NOTE — ED Notes (Signed)
Meal tray at bedside.  

## 2021-09-25 NOTE — H&P (Addendum)
History and Physical    Shannon Sutton ZOX:[id number] DOB: June 12, 1946 DOA: 09/24/2021  PCP: Shannon Sutton, Shannon Patricia, MD (Confirm with patient/family/NH records and if not entered, this has to be entered at [facility] point of entry) Patient coming from: Home  I have personally briefly reviewed patient's old medical records in [facility]  Chief Complaint: Confusion and agitation  HPI: Shannon Sutton is a 75 y.o. female with medical history significant of Parkinson disease, advanced dementia, restless leg syndrome, anxiety/depression, HTN, insomnia, presented with episode of agitation and confusion.  Patient baseline dementia, unable to provide any history, all history provided by patient's daughter at bedside.  Daughter reported that patient has had episodes of confusion and agitation at home by repeatedly staying "there is electricity in the house" for last 2 days, and refused to eat or drink since yesterday.  Patient denies any pain, no shortness of breath no cough no dysuria or diarrhea.  She has chronic constipation and insomnia.  ED Course: Mild tachycardia, uncontrolled hypertension, UA showed pyuria.  Patient was given 1 dose of IV ceftriaxone in the ED.  Review of Systems: Unable to perform, patient confused.  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   Recurrent  Dr Nickola Major 2009 had a bx/MRI: was on MTX and Prednisone   External hemorrhoids    Fibromyalgia with myofascial pain 11/16/2012   Headaches, cluster    History of CT scan of head 2022   History of EKG 2021   History of mammogram 2022   History of MRI 2022   Brain   Hypercholesterolemia 12/15/2013   Hypertension    Insomnia 11/16/2012   Myalgia and myositis, unspecified    Other disorder of muscle,  ligament, and fascia    eosinophilic fascitis   Other specified disease of hair and hair follicles    Pain in joint, multiple sites    Parkinson's disease (HCC)    Restless leg syndrome 12/15/2013   Unspecified sleep apnea    Urine incontinence     Past Surgical History:  Procedure Laterality Date   ABDOMINAL HYSTERECTOMY     ABDOMINOPLASTY  2006   "tummy tuck"   BREAST REDUCTION SURGERY  1995   BREAST SURGERY     CATARACT EXTRACTION, BILATERAL     COLONOSCOPY  2018   MUSCLE BIOPSY  2009   DEEP TISSUE   REFRACTIVE SURGERY     TONSILLECTOMY     TOTAL ABDOMINAL HYSTERECTOMY  1999   WISDOM TOOTH EXTRACTION       reports that she quit smoking about 34 years ago. Her smoking use included cigarettes. She has never used smokeless tobacco. She reports that she does not currently use alcohol. She reports that she does not use drugs.  Allergies  Allergen Reactions   Crestor [Rosuvastatin Calcium] Other (See Comments)    abd pain   Cymbalta [Duloxetine Hcl] Nausea Only   Effexor Xr [Venlafaxine Hcl Er] Other (See Comments)    dizziness   Morphine And Related Itching    agitation   Pravastatin Other (See Comments)    abd pain   Promethazine-Codeine Itching    Itching in face   Rizatriptan Other (See Comments)    Tingling, prickly sensation, feelings of palpitations,  flushing.     Family History  Problem Relation Age of Onset   Colon polyps Mother    Heart disease Mother    Heart attack Mother    Heart attack Father    Colon cancer Father 4   Heart disease Father    COPD Sister    Emphysema Sister    COPD Brother    Emphysema Brother    Lung cancer Brother    Breast cancer Paternal Grandmother    Heart disease Paternal Grandfather    Colon polyps Paternal Grandfather    Heart disease Other        family history   Parkinson's disease Other    Stomach cancer Neg Hx    Rectal cancer Neg Hx    Esophageal cancer Neg Hx      Prior to Admission medications    Medication Sig Start Date End Date Taking? Authorizing Provider  atorvastatin (LIPITOR) 40 MG tablet Take 1 tablet by mouth once daily 08/15/21  Yes Jodelle Red, MD  carbidopa-levodopa (SINEMET IR) 25-100 MG tablet Take 1 tablet by mouth 3 (three) times daily.   Yes [provider]  clonazePAM (KLONOPIN) 0.5 MG tablet 2 qam & 2 at dinner Patient taking differently: Take 1 mg by mouth 2 (two) times daily. 05/28/21  Yes Plovsky, Earvin Hansen, MD  donepezil (ARICEPT) 5 MG tablet Take 1 tablet (5 mg total) by mouth at bedtime. 06/14/21  Yes Sharon Seller, NP  hydrOXYzine (VISTARIL) 50 MG capsule Take one capsule by mouth at bedtime. Patient taking differently: Take 50 mg by mouth at bedtime. 08/21/21  Yes Plovsky, Earvin Hansen, MD  Melatonin 10 MG TABS Take 5-10 mg by mouth at bedtime.   Yes [provider]  mirtazapine (REMERON) 15 MG tablet Take 2 tablets (30 mg total) by mouth at bedtime. Patient taking differently: Take 15 mg by mouth at bedtime. 06/27/21 06/27/22 Yes Plovsky, Earvin Hansen, MD  ondansetron (ZOFRAN) 4 MG tablet TAKE 1 TABLET BY MOUTH EVERY 8 HOURS AS NEEDED FOR NAUSEA OR VOMITING Patient taking differently: Take 4 mg by mouth every 8 (eight) hours as needed for vomiting or nausea. 08/26/21  Yes Danis, Andreas Blower, MD  rOPINIRole (REQUIP) 1 MG tablet Take 1 mg by mouth 3 (three) times daily.   Yes [provider]  traZODone (DESYREL) 100 MG tablet Take 200 mg by mouth at bedtime.   Yes [provider]  cyclobenzaprine (FLEXERIL) 5 MG tablet Take 1 tablet (5 mg total) by mouth as needed. Patient not taking: Reported on 09/25/2021 08/06/21   Shannon Sutton, Shannon Patricia, MD  linaclotide [facility]) 145 MCG CAPS capsule Take 1 capsule (145 mcg total) by mouth daily before breakfast. Patient not taking: Reported on 09/25/2021 07/22/21   Sherrilyn Rist, MD  linaclotide [facility]) 72 MCG capsule Take 1 capsule (72 mcg total) by mouth daily before  breakfast. Patient not taking: Reported on 09/25/2021 07/31/21   Sherrilyn Rist, MD  omeprazole (PRILOSEC) 40 MG capsule Take 1 capsule (40 mg total) by mouth daily. Patient not taking: Reported on 09/25/2021 07/31/21   Sherrilyn Rist, MD  propranolol (INDERAL) 40 MG tablet Take 0.5 tablets (20 mg total) by mouth 2 (two) times daily. Patient not taking: Reported on 09/25/2021 06/19/21   Jodelle Red, MD    Physical Exam: Vitals:   09/25/21 1200 09/25/21 1215 09/25/21 1230 09/25/21 1245  BP: (!) 157/80 (!) 161/83 (!) 153/86 (!) 162/89  Pulse: (!) 115 Marland Kitchen)  113 (!) 104 (!) 115  Resp: 14 18 18 10   Temp:      TempSrc:      SpO2: 99% 99% 100% 100%  Weight:      Height:        Constitutional: NAD, calm, comfortable Vitals:   09/25/21 1200 09/25/21 1215 09/25/21 1230 09/25/21 1245  BP: (!) 157/80 (!) 161/83 (!) 153/86 (!) 162/89  Pulse: (!) 115 (!) 113 (!) 104 (!) 115  Resp: 14 18 18 10   Temp:      TempSrc:      SpO2: 99% 99% 100% 100%  Weight:      Height:       Eyes: PERRL, lids and conjunctivae normal ENMT: Mucous membranes are dry. Posterior pharynx clear of any exudate or lesions.Normal dentition.  Neck: normal, supple, no masses, no thyromegaly Respiratory: clear to auscultation bilaterally, no wheezing, no crackles. Normal respiratory effort. No accessory muscle use.  Cardiovascular: Regular rate and rhythm, no murmurs / rubs / gallops. No extremity edema. 2+ pedal pulses. No carotid bruits.  Abdomen: no tenderness, no masses palpated. No hepatosplenomegaly. Bowel sounds positive.  Musculoskeletal: no clubbing / cyanosis. No joint deformity upper and lower extremities. Good ROM, no contractures. Normal muscle tone.  Skin: no rashes, lesions, ulcers. No induration Neurologic: CN 2-12 grossly intact. Sensation intact, DTR normal. Strength 5/5 in all 4.  Psychiatric: Awake, oriented to herself, confused about time and place.  (  Labs on Admission: I have  personally reviewed following labs and imaging studies  CBC: Recent Labs  Lab 09/25/21 0011  WBC 6.9  NEUTROABS 3.5  HGB 13.1  HCT 39.0  MCV 95.6  PLT 233   Basic Metabolic Panel: Recent Labs  Lab 09/25/21 0011  NA 139  K 3.5  CL 106  CO2 27  GLUCOSE 130*  BUN 9  CREATININE 0.64  CALCIUM 9.1   GFR: Estimated Creatinine Clearance: 44.4 mL/min (by C-G formula based on SCr of 0.64 mg/dL). Liver Function Tests: Recent Labs  Lab 09/25/21 0011  AST 17  ALT 7  ALKPHOS 71  BILITOT 0.4  PROT 6.5  ALBUMIN 4.0   No results for input(s): LIPASE, AMYLASE in the last 168 hours. No results for input(s): AMMONIA in the last 168 hours. Coagulation Profile: No results for input(s): INR, PROTIME in the last 168 hours. Cardiac Enzymes: No results for input(s): CKTOTAL, CKMB, CKMBINDEX, TROPONINI in the last 168 hours. BNP (last 3 results) No results for input(s): PROBNP in the last 8760 hours. HbA1C: No results for input(s): HGBA1C in the last 72 hours. CBG: No results for input(s): GLUCAP in the last 168 hours. Lipid Profile: No results for input(s): CHOL, HDL, LDLCALC, TRIG, CHOLHDL, LDLDIRECT in the last 72 hours. Thyroid Function Tests: No results for input(s): TSH, T4TOTAL, FREET4, T3FREE, THYROIDAB in the last 72 hours. Anemia Panel: No results for input(s): VITAMINB12, FOLATE, FERRITIN, TIBC, IRON, RETICCTPCT in the last 72 hours. Urine analysis:    Component Value Date/Time   COLORURINE YELLOW 09/24/2021 2356   APPEARANCEUR CLOUDY (A) 09/24/2021 2356   LABSPEC 1.015 09/24/2021 2356   PHURINE 7.5 09/24/2021 2356   GLUCOSEU NEGATIVE 09/24/2021 2356   GLUCOSEU NEGATIVE 05/29/2016 1213   HGBUR NEGATIVE 09/24/2021 2356   BILIRUBINUR NEGATIVE 09/24/2021 2356   BILIRUBINUR NEG 12/14/2013 1324   KETONESUR NEGATIVE 09/24/2021 2356   PROTEINUR NEGATIVE 09/24/2021 2356   UROBILINOGEN 0.2 05/29/2016 1213   NITRITE POSITIVE (A) 09/24/2021 2356   LEUKOCYTESUR MODERATE  (A)  09/24/2021 2356    Radiological Exams on Admission: No results found.  EKG: Independently reviewed. Sinus, no PR or QTC changes.  Assessment/Plan Principal Problem:   Encephalopathy  (please populate well all problems here in Problem List. (For example, if patient is on BP meds at home and you resume or decide to hold them, it is a problem that needs to be her. Same for CAD, COPD, HLD and so on)  Acute metabolic encephalopathy -Secondary to UTI, continue ceftriaxone now while waiting for urine culture result. -1:1 for safety for tonight and reevaluate tomorrow. -Avoid sedation medications -Hold of brain imaging study, given there is no history of for or anticoagulation use.  Dehydration -IV fluid and encourage increased p.o. intake.  Uncontrolled hypertension -Start Coreg  Parkinson disease -Continue Sinemet  Advanced dementia -Continue Aricept  Sleep disorder -Continue hydroxyzine at bedtime, trazodone at bedtime, melatonin and Klonopin.D/W daughter regarding cut down some of the doses to avoid polypharmacy.  Check TSH.  And B12.  Anxiety/depression -No QTC changes on EKG, continue SSRI.  DVT prophylaxis: Lovenox Code Status: Full code Family Communication: Daughter at bedside Disposition Plan: Expect less than 2 midnight hospital stay Consults called: [facility] Admission status: MedSurg observation   Emeline General MD [facility] Pager [phone]  09/25/2021, 1:33 PM

## 2021-09-25 NOTE — ED Notes (Signed)
Per staffing, order for sitter was for tele-monitoring. Order d/c and new order placed for 1 on 1 observation.

## 2021-09-25 NOTE — Consult Note (Signed)
°  Spoke to patients nurse Sarah, RN who states that patient is currently in the waiting room and no room available at this time for tele psych consult.  Unable to complete telepsych assessment at this time.  Will attempt to do later if not patient will be seen 09/26/2021.  Abnormal urinalysis and culture:  Possible UTI.  No meds started by EDP at this time.    Seibert Keeter B. Ericah Scotto, NP

## 2021-09-25 NOTE — ED Provider Notes (Signed)
[facility] EMERGENCY DEPARTMENT Provider Note   CSN: [id number] Arrival date & time: 09/24/21  2319     History Chief Complaint  Patient presents with   Altered Mental Status        Constipation    Shannon Sutton is a 75 y.o. female.  75 year old female with prior medical history as detailed below presents for evaluation.  She is accompanied by her daughter who is at bedside.  The daughter reports the patient has been more confused over the last 2 to 3 days.  She has been more agitated and difficult to control in the home setting.  Patient's daughter is concerned about her change in mental status.  The patient is comfortable and cooperative during exam.  The patient's primary complaint is of constipation.  The patient's daughter reports that the patient typically has some degree of constipation at baseline.  The history is provided by the patient, medical records and a relative.  Altered Mental Status Presenting symptoms: behavior changes, confusion and disorientation   Severity:  Mild Most recent episode:  2 days ago Episode history:  Multiple Duration:  3 days Timing:  Constant Progression:  Worsening Chronicity:  New Constipation     Past Medical History:  Diagnosis Date   Adenomatous colon polyp 1990   Allergy    Anxiety 12/15/2013   Arthritis    Cataract    Chronic constipation    Depression 11/16/2012   Diverticulitis    Diverticulosis    Eosinophilic fasciitis 41/93/7902   Recurrent  Dr Trudie Reed 2009 had a bx/MRI: was on MTX and Prednisone   External hemorrhoids    Fibromyalgia with myofascial pain 11/16/2012   Headaches, cluster    History of CT scan of head 2022   History of EKG 2021   History of mammogram 2022   History of MRI 2022   Brain   Hypercholesterolemia 12/15/2013   Hypertension    Insomnia 11/16/2012   Myalgia and myositis, unspecified    Other disorder of muscle, ligament, and fascia    eosinophilic fascitis    Other specified disease of hair and hair follicles    Pain in joint, multiple sites    Parkinson's disease ([place])    Restless leg syndrome 12/15/2013   Unspecified sleep apnea    Urine incontinence     Patient Active Problem List   Diagnosis Date Noted   Altered mental status    Vitamin D deficiency 08/15/2020   Ataxia 11/23/2017   Dizzinesses 05/18/2017   Constipation 05/29/2016   Fatigue 08/30/2014   Generalized headaches 08/30/2014   Dyslipidemia 12/15/2013   Restless leg syndrome 12/15/2013   Anxiety 12/15/2013   Fibromyalgia with myofascial pain 11/16/2012   Insomnia 11/16/2012   Depression 11/16/2012    Past Surgical History:  Procedure Laterality Date   ABDOMINAL HYSTERECTOMY     ABDOMINOPLASTY  2006   "tummy tuck"   BREAST REDUCTION SURGERY  1995   BREAST SURGERY     CATARACT EXTRACTION, BILATERAL     COLONOSCOPY  2018   MUSCLE BIOPSY  2009   DEEP TISSUE   REFRACTIVE SURGERY     TONSILLECTOMY     TOTAL ABDOMINAL HYSTERECTOMY  1999   WISDOM TOOTH EXTRACTION       OB History     Gravida  3   Para  2   Term  2   Preterm  0   AB  1   Living  2  SAB  1   IAB  0   Ectopic  0   Multiple  0   Live Births  2           Family History  Problem Relation Age of Onset   Colon polyps Mother    Heart disease Mother    Heart attack Mother    Heart attack Father    Colon cancer Father 55   Heart disease Father    COPD Sister    Emphysema Sister    COPD Brother    Emphysema Brother    Lung cancer Brother    Breast cancer Paternal Grandmother    Heart disease Paternal Grandfather    Colon polyps Paternal Grandfather    Heart disease Other        family history   Parkinson's disease Other    Stomach cancer Neg Hx    Rectal cancer Neg Hx    Esophageal cancer Neg Hx     Social History   Tobacco Use   Smoking status: Former    Types: Cigarettes    Quit date: 12/19/1986    Years since quitting: 34.7   Smokeless tobacco: Never   Vaping Use   Vaping Use: Never used  Substance Use Topics   Alcohol use: Not Currently   Drug use: No    Home Medications Prior to Admission medications   Medication Sig Start Date End Date Taking? Authorizing Provider  atorvastatin (LIPITOR) 40 MG tablet Take 1 tablet by mouth once daily 08/15/21   Buford Dresser, MD  carbidopa-levodopa (SINEMET IR) 25-100 MG tablet Take 1 tablet by mouth 4 (four) times daily.    [provider]  clonazePAM (KLONOPIN) 0.5 MG tablet 2 qam & 2 at dinner 05/28/21   Plovsky, Berneta Sages, MD  cyclobenzaprine (FLEXERIL) 5 MG tablet Take 1 tablet (5 mg total) by mouth as needed. 08/06/21   Isaac Bliss, Rayford Halsted, MD  donepezil (ARICEPT) 5 MG tablet Take 1 tablet (5 mg total) by mouth at bedtime. 06/14/21   Lauree Chandler, NP  hydrOXYzine (VISTARIL) 50 MG capsule Take one capsule by mouth at bedtime. 08/21/21   Plovsky, Berneta Sages, MD  linaclotide [facility]) 145 MCG CAPS capsule Take 1 capsule (145 mcg total) by mouth daily before breakfast. 07/22/21   Doran Stabler, MD  linaclotide [facility]) 72 MCG capsule Take 1 capsule (72 mcg total) by mouth daily before breakfast. 07/31/21   Danis, Kirke Corin, MD  mirtazapine (REMERON) 15 MG tablet Take 2 tablets (30 mg total) by mouth at bedtime. 06/27/21 06/27/22  Plovsky, Berneta Sages, MD  omeprazole (PRILOSEC) 40 MG capsule Take 1 capsule (40 mg total) by mouth daily. 07/31/21   Nelida Meuse III, MD  ondansetron (ZOFRAN) 4 MG tablet TAKE 1 TABLET BY MOUTH EVERY 8 HOURS AS NEEDED FOR NAUSEA OR VOMITING 08/26/21   Doran Stabler, MD  propranolol (INDERAL) 40 MG tablet Take 0.5 tablets (20 mg total) by mouth 2 (two) times daily. 06/19/21   Buford Dresser, MD  RESTASIS 0.05 % ophthalmic emulsion 1 drop 2 (two) times daily. 02/04/21   [provider]  traZODone (DESYREL) 100 MG tablet Take 200 mg by mouth at bedtime.    [provider]    Allergies    Crestor [rosuvastatin calcium],  Cymbalta [duloxetine hcl], Effexor xr [venlafaxine hcl er], Morphine and related, Pravastatin, Promethazine-codeine, and Rizatriptan  Review of Systems   Review of Systems  Gastrointestinal:  Positive for constipation.  Psychiatric/Behavioral:  Positive for confusion.   All other systems reviewed and are negative.  Physical Exam Updated Vital Signs BP (!) 161/83    Pulse (!) 113    Temp 98.6 F (37 C) (Oral)    Resp 18    Ht 5\' 1"  (1.549 m)    Wt 52.6 kg    SpO2 99%    BMI 21.92 kg/m   Physical Exam Vitals and nursing note reviewed.  Constitutional:      General: She is not in acute distress.    Appearance: Normal appearance. She is well-developed.  HENT:     Head: Normocephalic and atraumatic.  Eyes:     Conjunctiva/sclera: Conjunctivae normal.     Pupils: Pupils are equal, round, and reactive to light.  Cardiovascular:     Rate and Rhythm: Normal rate and regular rhythm.     Heart sounds: Normal heart sounds.  Pulmonary:     Effort: Pulmonary effort is normal. No respiratory distress.     Breath sounds: Normal breath sounds.  Abdominal:     General: There is no distension.     Palpations: Abdomen is soft.     Tenderness: There is no abdominal tenderness.  Musculoskeletal:        General: No deformity. Normal range of motion.     Cervical back: Normal range of motion and neck supple.  Skin:    General: Skin is warm and dry.  Neurological:     General: No focal deficit present.     Mental Status: She is alert.     Comments: Alert, oriented to person and place.  Cannot recall the exact year.  She is aware of the month.  She is aware that Christmas is next week.    ED Results / Procedures / Treatments   Labs (all labs ordered are listed, but only abnormal results are displayed) Labs Reviewed  COMPREHENSIVE METABOLIC PANEL - Abnormal; Notable for the following components:      Result Value   Glucose, Bld 130 (*)    All other components within normal limits   SALICYLATE LEVEL - Abnormal; Notable for the following components:   Salicylate Lvl <3.4 (*)    All other components within normal limits  ACETAMINOPHEN LEVEL - Abnormal; Notable for the following components:   Acetaminophen (Tylenol), Serum <10 (*)    All other components within normal limits  URINALYSIS, ROUTINE W REFLEX MICROSCOPIC - Abnormal; Notable for the following components:   APPearance CLOUDY (*)    Nitrite POSITIVE (*)    Leukocytes,Ua MODERATE (*)    All other components within normal limits  URINALYSIS, MICROSCOPIC (REFLEX) - Abnormal; Notable for the following components:   Bacteria, UA MANY (*)    All other components within normal limits  RESP PANEL BY RT-PCR (FLU A&B, COVID) ARPGX2  URINE CULTURE  ETHANOL  RAPID URINE DRUG SCREEN, HOSP PERFORMED  CBC WITH DIFFERENTIAL/PLATELET    EKG None  Radiology No results found.  Procedures Procedures   Medications Ordered in ED Medications  cefTRIAXone (ROCEPHIN) 1 g in sodium chloride 0.9 % 100 mL IVPB (1 g Intravenous New Bag/Given 09/25/21 1159)    ED Course  I have reviewed the triage vital signs and the nursing notes.  Pertinent labs & imaging results that were available during my care of the patient were reviewed by me and considered in my medical decision making (see chart for details).    MDM Rules/Calculators/A&P  MDM  MSE complete  Shannon Sutton was evaluated in Emergency Department on 09/25/2021 for the symptoms described in the history of present illness. She was evaluated in the context of the global COVID-19 pandemic, which necessitated consideration that the patient might be at risk for infection with the SARS-CoV-2 virus that causes COVID-19. Institutional protocols and algorithms that pertain to the evaluation of patients at risk for COVID-19 are in a state of rapid change based on information released by regulatory bodies including the CDC and federal and state  organizations. These policies and algorithms were followed during the patient's care in the ED.  Patient is presenting with apparent change in mentation.  Work-up is suggestive of likely UTI.  Patient would benefit from admission for treatment of same.  Hospitalist service is aware of case and will evaluate for admission.  Final Clinical Impression(s) / ED Diagnoses Final diagnoses:  Urinary tract infection without hematuria, site unspecified    Rx / DC Orders ED Discharge Orders     None        Valarie Merino, MD 09/25/21 1235

## 2021-09-25 NOTE — ED Triage Notes (Signed)
Pt here for constipation. States that's she's been taking laxatives, but has been unable to have a full bowel movement. Pt has no complaints of abdominal pain.

## 2021-09-26 DIAGNOSIS — E7800 Pure hypercholesterolemia, unspecified: Secondary | ICD-10-CM | POA: Diagnosis present

## 2021-09-26 DIAGNOSIS — Z8249 Family history of ischemic heart disease and other diseases of the circulatory system: Secondary | ICD-10-CM | POA: Diagnosis not present

## 2021-09-26 DIAGNOSIS — R69 Illness, unspecified: Secondary | ICD-10-CM | POA: Diagnosis not present

## 2021-09-26 DIAGNOSIS — I10 Essential (primary) hypertension: Secondary | ICD-10-CM | POA: Diagnosis present

## 2021-09-26 DIAGNOSIS — K5909 Other constipation: Secondary | ICD-10-CM | POA: Diagnosis not present

## 2021-09-26 DIAGNOSIS — G2581 Restless legs syndrome: Secondary | ICD-10-CM | POA: Diagnosis not present

## 2021-09-26 DIAGNOSIS — Z885 Allergy status to narcotic agent status: Secondary | ICD-10-CM | POA: Diagnosis not present

## 2021-09-26 DIAGNOSIS — Z90710 Acquired absence of both cervix and uterus: Secondary | ICD-10-CM | POA: Diagnosis not present

## 2021-09-26 DIAGNOSIS — G4700 Insomnia, unspecified: Secondary | ICD-10-CM | POA: Diagnosis not present

## 2021-09-26 DIAGNOSIS — N390 Urinary tract infection, site not specified: Secondary | ICD-10-CM | POA: Diagnosis not present

## 2021-09-26 DIAGNOSIS — G9340 Encephalopathy, unspecified: Secondary | ICD-10-CM

## 2021-09-26 DIAGNOSIS — E785 Hyperlipidemia, unspecified: Secondary | ICD-10-CM | POA: Diagnosis not present

## 2021-09-26 DIAGNOSIS — F02811 Dementia in other diseases classified elsewhere, unspecified severity, with agitation: Secondary | ICD-10-CM | POA: Diagnosis present

## 2021-09-26 DIAGNOSIS — Z20822 Contact with and (suspected) exposure to covid-19: Secondary | ICD-10-CM | POA: Diagnosis not present

## 2021-09-26 DIAGNOSIS — B9620 Unspecified Escherichia coli [E. coli] as the cause of diseases classified elsewhere: Secondary | ICD-10-CM | POA: Diagnosis not present

## 2021-09-26 DIAGNOSIS — R451 Restlessness and agitation: Secondary | ICD-10-CM | POA: Diagnosis not present

## 2021-09-26 DIAGNOSIS — G9341 Metabolic encephalopathy: Secondary | ICD-10-CM | POA: Diagnosis not present

## 2021-09-26 DIAGNOSIS — Z888 Allergy status to other drugs, medicaments and biological substances status: Secondary | ICD-10-CM | POA: Diagnosis not present

## 2021-09-26 DIAGNOSIS — G20 Parkinson's disease: Secondary | ICD-10-CM | POA: Diagnosis present

## 2021-09-26 DIAGNOSIS — Z79899 Other long term (current) drug therapy: Secondary | ICD-10-CM | POA: Diagnosis not present

## 2021-09-26 DIAGNOSIS — F329 Major depressive disorder, single episode, unspecified: Secondary | ICD-10-CM | POA: Diagnosis present

## 2021-09-26 DIAGNOSIS — Z87891 Personal history of nicotine dependence: Secondary | ICD-10-CM | POA: Diagnosis not present

## 2021-09-26 DIAGNOSIS — Z820 Family history of epilepsy and other diseases of the nervous system: Secondary | ICD-10-CM | POA: Diagnosis not present

## 2021-09-26 NOTE — Progress Notes (Signed)
PROGRESS NOTE  Shannon Sutton  DOB: January 17, 1946  PCP: Henderson Cloud, MD NFA:[id number]  DOA: 09/24/2021  LOS: 0 days  Hospital Day: 3  Chief Complaint  Patient presents with   Altered Mental Status        Constipation    Brief narrative: Shannon Sutton is a 75 y.o. female with PMH significant for Parkinson disease, advanced dementia, restless leg syndrome, anxiety/depression, HTN, HLD insomnia On 12/13, patient was brought to [facility] as a walk-in by his daughter for confusion, agitation, delusion. Patient was transferred to ED for medical clearance.  In the ED, patient had tachycardia up to 110, blood pressure elevated to 150s Urinalysis with cloudy yellow urine, moderate amount of leukocytes, nitrate positive and many bacteria Started on IV Rocephin and admitted to hospitalist service  Subjective: Patient was seen and examined this morning.  Pleasant elderly Caucasian female.  Sitting up in bed.  Not in distress.  No new symptoms.  Alert, awake, oriented to place and person with some difficulty. Chart reviewed Remains afebrile, heart rate in 90s, blood pressure in 130s  Assessment/Plan: UTI -Urinalysis with positive nitrite, leukocytes.   -Urine culture is growing more than 100,000 CFU per mL of gram-negative rods -Currently on IV Rocephin, pending final urine culture report.  Acute metabolic encephalopathy Underlying dementia -Acute worsening of mental status probably secondary to UTI -Currently on one-to-one sitter. -Minimize use of sedatives.  Uncontrolled hypertension -Coreg 3.125 mg twice daily was started. -Continue to monitor blood pressure   Parkinson disease -Continue Sinemet   Advanced dementia -Continue Aricept   Sleep disorder -Continue hydroxyzine at bedtime, trazodone at bedtime, melatonin and Klonopin. -May need to cut down.  His medications to avoid polypharmacy.   -Vitamin B12 level normal.  TSH level normal.    Anxiety/depression -No QTC changes on EKG, continue SSRI.  Mobility: Encourage ambulation. Living condition: Lives at home with daughter Goals of care:   Code Status: Full Code  Nutritional status: Body mass index is 21.92 kg/m.      Diet:  Diet Order             Diet Heart Room service appropriate? Yes; Fluid consistency: Thin  Diet effective now                  DVT prophylaxis:  enoxaparin (LOVENOX) injection 40 mg Start: 09/25/21 1600   Antimicrobials: IV Rocephin Fluid: Normal saline 100 mill per hour Consultants: Psychiatry Family Communication: None at bedside  Status is: Inpatient  Continue in-hospital care because: Needs IV antibiotics, may need psychiatric placement Level of care: Med-Surg   Dispo: The patient is from: Home              Anticipated d/c is to: Pending psychiatric evaluation              Patient currently is not medically stable to d/c.   Difficult to place patient No     Infusions:   cefTRIAXone (ROCEPHIN)  IV 1 g (09/26/21 1257)    Scheduled Meds:  atorvastatin  40 mg Oral Daily   carbidopa-levodopa  1 tablet Oral QID   carvedilol  3.125 mg Oral BID WC   clonazePAM  1 mg Oral BID   donepezil  5 mg Oral QHS   enoxaparin (LOVENOX) injection  40 mg Subcutaneous Q24H   fluticasone  2 spray Each Nare Daily   hydrOXYzine  50 mg Oral QHS   melatonin  5 mg Oral QHS  mirtazapine  30 mg Oral QHS   polyethylene glycol  17 g Oral BID   rOPINIRole  1 mg Oral TID   traZODone  200 mg Oral QHS    PRN meds: cyclobenzaprine, ondansetron, sodium chloride   Antimicrobials: Anti-infectives (From admission, onward)    Start     Dose/Rate Route Frequency Ordered Stop   09/26/21 1200  cefTRIAXone (ROCEPHIN) 1 g in sodium chloride 0.9 % 100 mL IVPB        1 g 200 mL/hr over 30 Minutes Intravenous Every 24 hours 09/25/21 1301     09/25/21 1145  cefTRIAXone (ROCEPHIN) 1 g in sodium chloride 0.9 % 100 mL IVPB        1 g 200 mL/hr over 30  Minutes Intravenous  Once 09/25/21 1136 09/25/21 1249       Objective: Vitals:   09/26/21 0719 09/26/21 1249  BP: 132/74 (!) 103/53  Pulse: 99 80  Resp: 16 16  Temp: 97.6 F (36.4 C) 97.9 F (36.6 C)  SpO2: 98% 98%    Intake/Output Summary (Last 24 hours) at 09/26/2021 1450 Last data filed at 09/26/2021 1300 Gross per 24 hour  Intake 1796.2 ml  Output 550 ml  Net 1246.2 ml   Filed Weights   09/25/21 0011  Weight: 52.6 kg   Weight change:  Body mass index is 21.92 kg/m.   Physical Exam: General exam: Pleasant, elderly Caucasian female.  Not in physical distress Skin: No rashes, lesions or ulcers. HEENT: Atraumatic, normocephalic, no obvious bleeding Lungs: Clear to auscultation bilaterally CVS: Regular rate and rhythm, no murmur GI/Abd soft, nontender, nondistended, bowel sound present CNS: Alert, awake, oriented to place and person with difficulty. Psychiatry: Mood appropriate Extremities: No pedal edema, no calf tenderness  Data Review: I have personally reviewed the laboratory data and studies available.  F/u labs ordered Unresulted Labs (From admission, onward)     Start     Ordered   09/27/21 0500  CBC with Differential/Platelet  Tomorrow morning,   R       Question:  Specimen collection method  Answer:  Lab=Lab collect   09/26/21 1450   09/27/21 0500  Basic metabolic panel  Tomorrow morning,   R       Question:  Specimen collection method  Answer:  Lab=Lab collect   09/26/21 1450            Signed, Lorin Glass, MD [facility] 09/26/2021

## 2021-09-26 NOTE — Progress Notes (Signed)
Transition of Care [facility]) Screening Note   Patient Details  Name: Shannon Sutton Date of Birth: May 03, 1946   Transition of Care [facility]) CM/SW Contact:    Kermit Balo, RN Phone Number: 09/26/2021, 12:52 PM    Transition of Care Department [facility]) has reviewed patient. We will continue to monitor patient advancement through interdisciplinary progression rounds. If new patient transition needs arise, please place a TOC consult.

## 2021-09-26 NOTE — Progress Notes (Signed)
Pt still needing a sitter at bedside. Pt still exhibits anxiety and confusion.

## 2021-09-26 NOTE — Plan of Care (Signed)

## 2021-09-26 NOTE — Plan of Care (Signed)
  Problem: Education: Goal: Knowledge of General Education information will improve Description: Including pain rating scale, medication(s)/side effects and non-pharmacologic comfort measures Outcome: Progressing   Problem: Health Behavior/Discharge Planning: Goal: Ability to manage health-related needs will improve Outcome: Progressing   Problem: Clinical Measurements: Goal: Ability to maintain clinical measurements within normal limits will improve Outcome: Progressing   Problem: Nutrition: Goal: Adequate nutrition will be maintained Outcome: Progressing   Problem: Coping: Goal: Level of anxiety will decrease Outcome: Progressing   Problem: Safety: Goal: Ability to remain free from injury will improve Outcome: Progressing   

## 2021-09-26 NOTE — Plan of Care (Signed)
°  Problem: Education: Goal: Knowledge of General Education information will improve Description: Including pain rating scale, medication(s)/side effects and non-pharmacologic comfort measures Outcome: Progressing   Problem: Health Behavior/Discharge Planning: Goal: Ability to manage health-related needs will improve Outcome: Progressing   Problem: Clinical Measurements: Goal: Ability to maintain clinical measurements within normal limits will improve Outcome: Progressing   Problem: Activity: Goal: Risk for activity intolerance will decrease Outcome: Progressing   Problem: Coping: Goal: Level of anxiety will decrease Outcome: Progressing   Problem: Safety: Goal: Ability to remain free from injury will improve Outcome: Progressing   

## 2021-09-27 ENCOUNTER — Encounter (HOSPITAL_COMMUNITY): Payer: Self-pay | Admitting: Internal Medicine

## 2021-09-27 DIAGNOSIS — R451 Restlessness and agitation: Secondary | ICD-10-CM

## 2021-09-27 DIAGNOSIS — G9340 Encephalopathy, unspecified: Secondary | ICD-10-CM | POA: Diagnosis not present

## 2021-09-27 DIAGNOSIS — R69 Illness, unspecified: Secondary | ICD-10-CM | POA: Diagnosis not present

## 2021-09-27 LAB — CBC WITH DIFFERENTIAL/PLATELET
Abs Immature Granulocytes: 0.01 10*3/uL (ref 0.00–0.07)
Basophils Absolute: 0 10*3/uL (ref 0.0–0.1)
Basophils Relative: 1 %
Eosinophils Absolute: 0.2 10*3/uL (ref 0.0–0.5)
Eosinophils Relative: 4 %
HCT: 34.4 % — ABNORMAL LOW (ref 36.0–46.0)
Hemoglobin: 11.8 g/dL — ABNORMAL LOW (ref 12.0–15.0)
Immature Granulocytes: 0 %
Lymphocytes Relative: 43 %
Lymphs Abs: 2.6 10*3/uL (ref 0.7–4.0)
MCH: 32.5 pg (ref 26.0–34.0)
MCHC: 34.3 g/dL (ref 30.0–36.0)
MCV: 94.8 fL (ref 80.0–100.0)
Monocytes Absolute: 0.5 10*3/uL (ref 0.1–1.0)
Monocytes Relative: 9 %
Neutro Abs: 2.7 10*3/uL (ref 1.7–7.7)
Neutrophils Relative %: 43 %
Platelets: 205 10*3/uL (ref 150–400)
RBC: 3.63 MIL/uL — ABNORMAL LOW (ref 3.87–5.11)
RDW: 12.5 % (ref 11.5–15.5)
WBC: 6.2 10*3/uL (ref 4.0–10.5)
nRBC: 0 % (ref 0.0–0.2)

## 2021-09-27 LAB — URINE CULTURE: Culture: >=100000 — AB

## 2021-09-27 LAB — BASIC METABOLIC PANEL
Anion gap: 7 (ref 5–15)
BUN: 8 mg/dL (ref 8–23)
CO2: 25 mmol/L (ref 22–32)
Calcium: 8.6 mg/dL — ABNORMAL LOW (ref 8.9–10.3)
Chloride: 107 mmol/L (ref 98–111)
Creatinine, Ser: 0.67 mg/dL (ref 0.44–1.00)
GFR, Estimated: >60 mL/min (ref >60)
Glucose, Bld: 90 mg/dL (ref 70–99)
Potassium: 3.4 mmol/L — ABNORMAL LOW (ref 3.5–5.1)
Sodium: 139 mmol/L (ref 135–145)

## 2021-09-27 MED ORDER — CEPHALEXIN 500 MG PO CAPS: 500.0000 mg | ORAL_CAPSULE | Freq: Three times a day (TID) | ORAL | 0 refills | Status: AC

## 2021-09-27 MED ORDER — POTASSIUM CHLORIDE CRYS ER 20 MEQ PO TBCR
40.0000 meq | EXTENDED_RELEASE_TABLET | Freq: Once | ORAL | Status: AC
  Administered 2021-09-27: 40 meq via ORAL
  Filled 2021-09-27: qty 2

## 2021-09-27 MED ORDER — CEPHALEXIN 500 MG PO CAPS
500.0000 mg | ORAL_CAPSULE | Freq: Three times a day (TID) | ORAL | Status: DC
  Administered 2021-09-27 (×2): 500 mg via ORAL
  Filled 2021-09-27 (×2): qty 1

## 2021-09-27 MED ORDER — PROPRANOLOL HCL 20 MG PO TABS: 20.0000 mg | ORAL_TABLET | Freq: Two times a day (BID) | ORAL | 2 refills | Status: DC

## 2021-09-27 NOTE — TOC Transition Note (Signed)
Transition of Care [facility]) - CM/SW Discharge Note   Patient Details  Name: Shannon Sutton MRN: [id number] Date of Birth: Feb 22, 1946  Transition of Care [facility]) CM/SW Contact:  Pollie Friar, RN Phone Number: 09/27/2021, 2:20 PM   Clinical Narrative:    Patient is discharging home with home health services through [facility]. CM also spoke with daughter, Shannon Sutton and she is in agreement. Plan is for patient to be admitted to an ALF once [place] chooses one. Information on the AVS.  Pt's daughter to provide transport home.    Final next level of care: Home w Home Health Services Barriers to Discharge: No Barriers Identified   Patient Goals and CMS Choice   CMS Medicare.gov Compare Post Acute Care list provided to:: Patient Represenative (must comment) Choice offered to / list presented to : Patient, Adult Children  Discharge Placement                       Discharge Plan and Services                          HH Arranged: PT [facility] Agency: [facility] [place] Date [place]: 09/27/21   Representative spoke with at [place]: [place] ([place]) Interventions     Readmission Risk Interventions No flowsheet data found.

## 2021-09-27 NOTE — Telephone Encounter (Signed)
Erroneous encounter

## 2021-09-27 NOTE — Consult Note (Signed)
Reason for Consult: Sent from [facility] for medical clearance needs inpatient follow up Referring Physician: Terrilee Croak, MD   Assessment/Plan: Shannon Sutton is a 75 y.o. female admitted medically for 09/24/2021 11:46 PM for acute metabolic encephalopathy secondary to UTI.  She carries the psychiatric diagnoses of MDD and has a past medical history of  Parkinson disease, advanced dementia, restless leg syndrome, HTN, .  Psychiatry was consulted for Inpatient follow up after being sent from [facility] for medical clearance.   She meets criteria for acute metabolic encephalopathy secondary to infection based on the documented symptoms and their resolution with commencing antibiotic medication.  Outpatient psychotropic medications include Atarax, Remeron, and Trazodone and historically has had a good response to these medications.  She was compliant with medications prior to admission as evidenced by dispense report.  On initial examination, patient sitting in bed with safety sitter in room.      Recommendations: -Continue current medications     Continue rest of care per Primary Team These recommendations have been discussed with the Primary Team Psychiatry will sign off at this time, please reconsult if any questions arise   Shannon Sutton is an 75 y.o. female.  HPI: Shannon Sutton is a 75 y.o. female with medical history significant of Parkinson disease, advanced dementia, restless leg syndrome, anxiety/depression, HTN, insomnia, presented with episode of agitation and confusion.  Patient baseline dementia, unable to provide any history, all history provided by patient's daughter at bedside.  Daughter reported that patient has had episodes of confusion and agitation at home by repeatedly staying "there is electricity in the house" for last 2 days, and refused to eat or drink since yesterday.  Patient denies any pain, no shortness of breath no cough no dysuria or diarrhea.  She has chronic constipation and  insomnia.    Started off the interview with orientation questions which patient was able to state her name, location, month, year, and situation.  She was able to correctly answer all 4 ICAM questions.  She was able to correctly state the days of the week backwards.  When asked to count down from 42-27 she initially counted from 42-43 but then caught herself and then counted 42, 41, 40 and then stopped and did not count further.  She did display some issues with her memory when asked who the current president was she said pants and then thought the next president was Obama.  When corrected to state that current president is Barbette Or and Vanice Sarah with the vice president of Trump she then stated will have impaired attention since that man was president.  When asked what brought her into the hospital she stated that she had a UTI and apparently it was significant enough to be here.  She states that she is unhappy being in the hospital but understands why she was brought here.  When asked about the reports of her fixation on electricity she stated that she thinks it was a dream and that she thought there had been a power outage but that lights were still on in the house and so did not understand how the electricity was working.  She states that now she has no thoughts of electricity and does not think that there is electricity around her or flowing through her.  She reports past psychiatric history of depression.  She reports that she has been on medications in the past but cannot remember the names of them.  She states that her daughter fills her pillbox  for her but she knows that the pills given to her here in the hospital are different shapes and the ones she normally takes.  She reports no prior psychiatric hospitalizations.  She reports no known family history of psychiatric illnesses.  She reports no history of suicide attempts or self-injurious behavior.  She reports no alcohol use.  She reports that she  stopped smoking cigarettes in the 1980s.  She reports no illicit substance use.  When asked how she feels today she states that she does not want to be in the hospital, however, does think she is doing better physically.  She states she feels a bit more like herself.  She does mention that she is continuing to have significant issues with congestion which have been chronic.  She does report that she had a bowel movement yesterday despite her history of chronic constipation.  She reports no other concerns at present.  She reports no SI, HI, or AVH.    Call patient's daughter Florene Route [phone].  When asked what her mother had been doing prior to admission she states that for the last few days patient had called her over 20 times leaving rambling messages.  She states that the content of these messages concern the electricity and that multiple times she stated everything in the house was dead.  She states that she has visited the patient and that even yesterday she seemed to be improving and states that she has been talking with the patient on the phone multiple times a day and that she sounds much better and more coherent than prior to admission.  Discussed with her that given the findings of UTI and the quick response to antibiotics most likely this was AMS secondary to infection and that symptoms should continue to improve but could linger for a while and it could take up to a few weeks for patient to return to baseline.  She then reports that patient's neurologist had recently discussed looking into assisted living for the patient.  She asked if there were any medication changes that we would recommend at this time.  Discussed that given patient's age and dementia they should have a conversation with her outpatient provider about potentially reducing her Klonopin dose but that there should be a risk-benefit analysis before any decisions are made.  She reports no other concerns at  present.  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  Dr Trudie Reed 2009 had a bx/MRI: was on MTX and Prednisone   External hemorrhoids    Fibromyalgia with myofascial pain 11/16/2012   Headaches, cluster    History of CT scan of head 2022   History of EKG 2021   History of mammogram 2022   History of MRI 2022   Brain   Hypercholesterolemia 12/15/2013   Hypertension    Insomnia 11/16/2012   Myalgia and myositis, unspecified    Other disorder of muscle, ligament, and fascia    eosinophilic fascitis   Other specified disease of hair and hair follicles    Pain in joint, multiple sites    Parkinson's disease ([place])    Restless leg syndrome 12/15/2013   Unspecified sleep apnea    Urine incontinence     Past Surgical History:  Procedure Laterality Date   ABDOMINAL HYSTERECTOMY     ABDOMINOPLASTY  2006   "tummy tuck"   BREAST REDUCTION SURGERY  1995   BREAST SURGERY     CATARACT EXTRACTION, BILATERAL     COLONOSCOPY  2018   MUSCLE BIOPSY  2009   DEEP TISSUE   REFRACTIVE SURGERY     TONSILLECTOMY     TOTAL ABDOMINAL HYSTERECTOMY  1999   WISDOM TOOTH EXTRACTION      Family History  Problem Relation Age of Onset   Colon polyps Mother    Heart disease Mother    Heart attack Mother    Heart attack Father    Colon cancer Father 33   Heart disease Father    COPD Sister    Emphysema Sister    COPD Brother    Emphysema Brother    Lung cancer Brother    Breast cancer Paternal Grandmother    Heart disease Paternal Grandfather    Colon polyps Paternal Grandfather    Heart disease Other        family history   Parkinson's disease Other    Stomach cancer Neg Hx    Rectal cancer Neg Hx    Esophageal cancer Neg Hx     Social History:  reports that she quit  smoking about 34 years ago. Her smoking use included cigarettes. She has never used smokeless tobacco. She reports that she does not currently use alcohol. She reports that she does not use drugs.  Allergies:  Allergies  Allergen Reactions   Crestor [Rosuvastatin Calcium] Other (See Comments)    abd pain   Cymbalta [Duloxetine Hcl] Nausea Only   Effexor Xr [Venlafaxine Hcl Er] Other (See Comments)    dizziness   Morphine And Related Itching    agitation   Pravastatin Other (See Comments)    abd pain   Promethazine-Codeine Itching    Itching in face   Rizatriptan Other (See Comments)    Tingling, prickly sensation, feelings of palpitations, flushing.     Medications: I have reviewed the patient's current medications. Prior to Admission:  No medications prior to admission.   Scheduled:  atorvastatin  40 mg Oral Daily   carbidopa-levodopa  1 tablet Oral QID   carvedilol  3.125 mg Oral BID WC   cephALEXin  500 mg Oral Q8H   clonazePAM  1 mg Oral BID   donepezil  5 mg Oral QHS   enoxaparin (LOVENOX) injection  40 mg Subcutaneous Q24H   fluticasone  2 spray Each Nare Daily   hydrOXYzine  50 mg Oral QHS   melatonin  5 mg Oral QHS   mirtazapine  30 mg Oral QHS   polyethylene glycol  17 g Oral BID   rOPINIRole  1 mg Oral TID   traZODone  200 mg Oral QHS   Continuous: AJG:[id number], ondansetron, sodium chloride Anti-infectives (From admission, onward)    Start     Dose/Rate Route Frequency Ordered Stop   09/27/21 0900  cephALEXin (KEFLEX) capsule 500 mg        500 mg Oral Every 8 hours 09/27/21 0802     09/27/21 0000  cephALEXin (KEFLEX) 500 MG capsule        500 mg Oral Every 8 hours 09/27/21 1248 09/30/21 2359   09/26/21 1200  cefTRIAXone (ROCEPHIN) 1 g in sodium chloride 0.9 % 100 mL IVPB  Status:  Discontinued        1 g 200 mL/hr over 30 Minutes Intravenous Every  24 hours 09/25/21 1301 09/27/21 0802   09/25/21 1145  cefTRIAXone (ROCEPHIN) 1 g in sodium chloride  0.9 % 100 mL IVPB        1 g 200 mL/hr over 30 Minutes Intravenous  Once 09/25/21 1136 09/25/21 1249       Results for orders placed or performed during the hospital encounter of 09/24/21 (from the past 48 hour(s))  TSH     Status: None   Collection Time: 09/25/21  6:34 PM  Result Value Ref Range   TSH 2.561 0.350 - 4.500 uIU/mL    Comment: Performed by a 3rd Generation assay with a functional sensitivity of <=0.01 uIU/mL. Performed at [facility], [place] [address] [place], [place] [address]   Vitamin B12     Status: None   Collection Time: 09/25/21  6:34 PM  Result Value Ref Range   Vitamin B-12 503 180 - 914 pg/mL    Comment: (NOTE) This assay is not validated for testing neonatal or myeloproliferative syndrome specimens for Vitamin B12 levels. Performed at [facility], [place] [address] [place], [place] [address]   CBC with Differential/Platelet     Status: Abnormal   Collection Time: 09/27/21  2:17 AM  Result Value Ref Range   WBC 6.2 4.0 - 10.5 K/uL   RBC 3.63 (L) 3.87 - 5.11 MIL/uL   Hemoglobin 11.8 (L) 12.0 - 15.0 g/dL   HCT 34.4 (L) 36.0 - 46.0 %   MCV 94.8 80.0 - 100.0 fL   MCH 32.5 26.0 - 34.0 pg   MCHC 34.3 30.0 - 36.0 g/dL   RDW 12.5 11.5 - 15.5 %   Platelets 205 150 - 400 K/uL   nRBC 0.0 0.0 - 0.2 %   Neutrophils Relative % 43 %   Neutro Abs 2.7 1.7 - 7.7 K/uL   Lymphocytes Relative 43 %   Lymphs Abs 2.6 0.7 - 4.0 K/uL   Monocytes Relative 9 %   Monocytes Absolute 0.5 0.1 - 1.0 K/uL   Eosinophils Relative 4 %   Eosinophils Absolute 0.2 0.0 - 0.5 K/uL   Basophils Relative 1 %   Basophils Absolute 0.0 0.0 - 0.1 K/uL   Immature Granulocytes 0 %   Abs Immature Granulocytes 0.01 0.00 - 0.07 K/uL    Comment: Performed at [facility], [address] [place], [place] [address]  Basic metabolic panel     Status: Abnormal   Collection Time: 09/27/21  2:17 AM  Result Value Ref Range   Sodium 139 135 - 145 mmol/L   Potassium 3.4 (L) 3.5 - 5.1  mmol/L   Chloride 107 98 - 111 mmol/L   CO2 25 22 - 32 mmol/L   Glucose, Bld 90 70 - 99 mg/dL    Comment: Glucose reference range applies only to samples taken after fasting for at least 8 hours.   BUN 8 8 - 23 mg/dL   Creatinine, Ser 0.67 0.44 - 1.00 mg/dL   Calcium 8.6 (L) 8.9 - 10.3 mg/dL   GFR, Estimated >60 >60 mL/min    Comment: (NOTE) Calculated using the CKD-EPI Creatinine Equation (2021)    Anion gap 7 5 - 15    Comment: Performed at [place] [address] [place], [place] [address]    No results found.   Blood pressure (!) 157/73, pulse 100, temperature 97.6 F (36.4 C), temperature source Oral, resp. rate 19, height 5\' 1"  (1.549 m), weight 52.6 kg, SpO2 95 %.  Psychiatric Specialty Exam: Physical Exam Vitals and nursing note reviewed.  Constitutional:      General: She is not in acute distress.    Appearance: Normal appearance. She is normal weight. She is not ill-appearing or toxic-appearing.  HENT:     Head: Normocephalic and atraumatic.  Pulmonary:     Effort: Pulmonary effort is normal.  Musculoskeletal:        General: Normal range of motion.  Neurological:     General: No focal deficit present.     Mental Status: She is alert.    Review of Systems  Respiratory:  Negative for cough and shortness of breath.   Cardiovascular:  Negative for chest pain.  Gastrointestinal:  Negative for abdominal pain, constipation, diarrhea, nausea and vomiting.  Psychiatric/Behavioral:  Negative for agitation, dysphoric mood, hallucinations, sleep disturbance and suicidal ideas. The patient is not nervous/anxious.    Blood pressure (!) 157/73, pulse 100, temperature 97.6 F (36.4 C), temperature source Oral, resp. rate 19, height 5\' 1"  (1.549 m), weight 52.6 kg, SpO2 95 %.Body mass index is 21.92 kg/m.  General Appearance: Casual and Fairly Groomed  Eye Contact:  Good  Speech:  Clear and Coherent and Normal Rate  Volume:  Normal  Mood:  Dysphoric  Affect:   Appropriate and Congruent  Thought Process:  Coherent  Orientation:  Full (Time, Place, and Person)  Thought Content:  WDL and Logical  Suicidal Thoughts:  No  Homicidal Thoughts:  No  Memory:  Immediate;   Fair Recent;   Poor  Judgement:  Fair  Insight:  Fair  Psychomotor Activity:  Normal  Concentration:  Concentration: Fair and Attention Span: Fair  Recall:   fair to poor  Fund of Knowledge:  Fair  Language:  Good  Akathisia:  Negative  Handed:  Right  AIMS (if indicated):     Assets:  Communication Skills Resilience Social Support  ADL's:  Intact  Cognition:  WNL  Sleep:   fair    Briant Cedar 09/27/2021, 9:20 AM

## 2021-09-27 NOTE — Discharge Summary (Signed)
Physician Discharge Summary  RIN NOVIELLO ZOX:[id number] DOB: 02-21-1946 DOA: 09/24/2021  PCP: Philip Aspen, Limmie Patricia, MD  Admit date: 09/24/2021 Discharge date: 09/27/2021  Admitted From: Home Discharge disposition: Home with PT   Code Status: Full Code   Discharge Diagnosis:  Principal Problem:   Encephalopathy    Chief Complaint  Patient presents with   Altered Mental Status        Constipation    Brief narrative: Shannon Sutton is a 75 y.o. female with PMH significant for Parkinson disease, advanced dementia, restless leg syndrome, anxiety/depression, HTN, HLD insomnia On 12/13, patient was brought to [facility] as a walk-in by his daughter for confusion, agitation, delusion. Patient was transferred to ED for medical clearance.  In the ED, patient had tachycardia up to 110, blood pressure elevated to 150s Urinalysis with cloudy yellow urine, moderate amount of leukocytes, nitrate positive and many bacteria Started on IV Rocephin and admitted to hospitalist service  Subjective: Patient was seen and examined this morning.  Sitting up in bed.  Not in distress.  No new symptoms.  Alert, awake, oriented x3.  Wants to go home.  I called and discussed with her daughter Ms. Shelby this afternoon.  All questions answered.  Agreeable to discharge home today.  Assessment/Plan: E. coli UTI -Urinalysis with positive nitrite, leukocytes.   -Urine culture is growing more than 100,000 CFU per mL of E. coli.   -Currently improving on IV Rocephin.  Will discharge on oral Keflex for next 3 days.  Acute metabolic encephalopathy Underlying dementia -Acute worsening of mental status probably secondary to UTI -Improved with treatment of UTI. -She had initially presented to psychiatry and was transferred to hospital for medical treatment.  Discussed with psychiatry team.  No need of inpatient psychiatric hospitalization at this time.  Uncontrolled hypertension -Propanolol  resumed.blood pressure improved.    Parkinson disease -Continue Sinemet   Advanced dementia -Continue Aricept   Sleep disorder -Continue hydroxyzine at bedtime, trazodone at bedtime, melatonin and Klonopin. -May need to cut down medications to avoid polypharmacy.   -Vitamin B12 level normal.  TSH level normal.   Anxiety/depression -No QTC changes on EKG, continue SSRI.  Mobility: Encourage ambulation. Living condition: Lives at home with daughter Goals of care:   Code Status: Full Code  Nutritional status: Body mass index is 21.92 kg/m.       Discharge Medications:   Allergies as of 09/27/2021       Reactions   Crestor [rosuvastatin Calcium] Other (See Comments)   abd pain   Cymbalta [duloxetine Hcl] Nausea Only   Effexor Xr [venlafaxine Hcl Er] Other (See Comments)   dizziness   Morphine And Related Itching   agitation   Pravastatin Other (See Comments)   abd pain   Promethazine-codeine Itching   Itching in face   Rizatriptan Other (See Comments)   Tingling, prickly sensation, feelings of palpitations, flushing.         Medication List     STOP taking these medications    cyclobenzaprine 5 MG tablet Commonly known as: FLEXERIL   hydrOXYzine 50 MG capsule Commonly known as: Vistaril   linaclotide 145 MCG Caps capsule Commonly known as: LINZESS   linaclotide 72 MCG capsule Commonly known as: LINZESS   mirtazapine 15 MG tablet Commonly known as: Remeron   omeprazole 40 MG capsule Commonly known as: PRILOSEC       TAKE these medications    atorvastatin 40 MG tablet Commonly known as: LIPITOR Take  1 tablet by mouth once daily   carbidopa-levodopa 25-100 MG tablet Commonly known as: SINEMET IR Take 1 tablet by mouth 3 (three) times daily.   cephALEXin 500 MG capsule Commonly known as: KEFLEX Take 1 capsule (500 mg total) by mouth every 8 (eight) hours for 3 days.   clonazePAM 0.5 MG tablet Commonly known as: KLONOPIN 2 qam & 2 at  dinner What changed:  how much to take how to take this when to take this additional instructions   donepezil 5 MG tablet Commonly known as: ARICEPT Take 1 tablet (5 mg total) by mouth at bedtime.   Melatonin 10 MG Tabs Take 5-10 mg by mouth at bedtime.   ondansetron 4 MG tablet Commonly known as: ZOFRAN TAKE 1 TABLET BY MOUTH EVERY 8 HOURS AS NEEDED FOR NAUSEA OR VOMITING What changed: reasons to take this   propranolol 20 MG tablet Commonly known as: INDERAL Take 1 tablet (20 mg total) by mouth 2 (two) times daily. What changed: medication strength   rOPINIRole 1 MG tablet Commonly known as: REQUIP Take 1 mg by mouth 3 (three) times daily.   traZODone 100 MG tablet Commonly known as: DESYREL Take 200 mg by mouth at bedtime.        Wound care:     Discharge Instructions:   Discharge Instructions     Call MD for:  difficulty breathing, headache or visual disturbances   Complete by: As directed    Call MD for:  extreme fatigue   Complete by: As directed    Call MD for:  hives   Complete by: As directed    Call MD for:  persistant dizziness or light-headedness   Complete by: As directed    Call MD for:  persistant nausea and vomiting   Complete by: As directed    Call MD for:  severe uncontrolled pain   Complete by: As directed    Call MD for:  temperature >100.4   Complete by: As directed    Diet general   Complete by: As directed    Discharge instructions   Complete by: As directed    General discharge instructions:  Follow with Primary MD Philip Aspen, Limmie Patricia, MD in 7 days   Get CBC/BMP checked in next visit within 1 week by PCP or SNF MD. (We routinely change or add medications that can affect your baseline labs and fluid status, therefore we recommend that you get the mentioned basic workup next visit with your PCP, your PCP may decide not to get them or add new tests based on their clinical decision)  On your next visit with your PCP,  please get your medicines reviewed and adjusted.  Please request your PCP  to go over all hospital tests, procedures, radiology results at the follow up, please get all Hospital records sent to your PCP by signing hospital release before you go home.  Activity: As tolerated with Full fall precautions use walker/cane & assistance as needed  Avoid using any recreational substances like cigarette, tobacco, alcohol, or non-prescribed drug.  If you experience worsening of your admission symptoms, develop shortness of breath, life threatening emergency, suicidal or homicidal thoughts you must seek medical attention immediately by calling 911 or calling your MD immediately  if symptoms less severe.  You must read complete instructions/literature along with all the possible adverse reactions/side effects for all the medicines you take and that have been prescribed to you. Take any new medicine only after you have  completely understood and accepted all the possible adverse reactions/side effects.   Do not drive, operate heavy machinery, perform activities at heights, swimming or participation in water activities or provide baby sitting services if your were admitted for syncope or siezures until you have seen by Primary MD or a Neurologist and advised to do so again.  Do not drive when taking Pain medications.  Do not take more than prescribed Pain, Sleep and Anxiety Medications  Wear Seat belts while driving.  Please note You were cared for by a hospitalist during your hospital stay. If you have any questions about your discharge medications or the care you received while you were in the hospital after you are discharged, you can call the unit and asked to speak with the hospitalist on call if the hospitalist that took care of you is not available. Once you are discharged, your primary care physician will handle any further medical issues. Please note that NO REFILLS for any discharge medications will be  authorized once you are discharged, as it is imperative that you return to your primary care physician (or establish a relationship with a primary care physician if you do not have one) for your aftercare needs so that they can reassess your need for medications and monitor your lab values.   Increase activity slowly   Complete by: As directed        Follow ups:    Follow-up Information     [facility] Follow up.   Contact information: [address] [place] [address] [phone]                Discharge Exam:   Vitals:   09/26/21 2313 09/27/21 0402 09/27/21 0728 09/27/21 1112  BP: (!) 109/56 115/61 (!) 157/73 133/67  Pulse: 89 100 100 80  Resp: 17 18 19 18   Temp: 98 F (36.7 C) 98 F (36.7 C) 97.6 F (36.4 C) 97.7 F (36.5 C)  TempSrc: Oral Oral Oral Oral  SpO2: 96% 96% 95% 98%  Weight:      Height:        Body mass index is 21.92 kg/m.  General exam: Pleasant, elderly Caucasian female.  Not in physical distress Skin: No rashes, lesions or ulcers. HEENT: Atraumatic, normocephalic, no obvious bleeding Lungs: Clear to auscultation bilaterally CVS: Regular rate and rhythm, no murmur GI/Abd soft, nontender, nondistended, bowel sound present CNS: Alert, awake, oriented x3 Psychiatry: Mood appropriate Extremities: No pedal edema, no calf tenderness  Time coordinating discharge: 35 minutes   The results of significant diagnostics from this hospitalization (including imaging, microbiology, ancillary and laboratory) are listed below for reference.    Procedures and Diagnostic Studies:   No results found.   Labs:   Basic Metabolic Panel: Recent Labs  Lab 09/25/21 0011 09/27/21 0217  NA 139 139  K 3.5 3.4*  CL 106 107  CO2 27 25  GLUCOSE 130* 90  BUN 9 8  CREATININE 0.64 0.67  CALCIUM 9.1 8.6*   GFR Estimated Creatinine Clearance: 44.4 mL/min (by C-G formula based on SCr of 0.67  mg/dL). Liver Function Tests: Recent Labs  Lab 09/25/21 0011  AST 17  ALT 7  ALKPHOS 71  BILITOT 0.4  PROT 6.5  ALBUMIN 4.0   No results for input(s): LIPASE, AMYLASE in the last 168 hours. No results for input(s): AMMONIA in the last 168 hours. Coagulation profile No results for input(s): INR, PROTIME in the last 168 hours.  CBC: Recent Labs  Lab 09/25/21 0011 09/27/21 0217  WBC 6.9 6.2  NEUTROABS 3.5 2.7  HGB 13.1 11.8*  HCT 39.0 34.4*  MCV 95.6 94.8  PLT 233 205   Cardiac Enzymes: No results for input(s): CKTOTAL, CKMB, CKMBINDEX, TROPONINI in the last 168 hours. BNP: Invalid input(s): POCBNP CBG: No results for input(s): GLUCAP in the last 168 hours. D-Dimer No results for input(s): DDIMER in the last 72 hours. Hgb A1c No results for input(s): HGBA1C in the last 72 hours. Lipid Profile No results for input(s): CHOL, HDL, LDLCALC, TRIG, CHOLHDL, LDLDIRECT in the last 72 hours. Thyroid function studies Recent Labs    09/25/21 1834  TSH 2.561   Anemia work up Recent Labs    09/25/21 1834  VITAMINB12 503   Microbiology Recent Results (from the past 240 hour(s))  Resp Panel by RT-PCR (Flu A&B, Covid) Nasopharyngeal Swab     Status: None   Collection Time: 09/24/21 11:56 PM   Specimen: Nasopharyngeal Swab; Nasopharyngeal(NP) swabs in vial transport medium  Result Value Ref Range Status   SARS Coronavirus 2 by RT PCR NEGATIVE NEGATIVE Final    Comment: (NOTE) SARS-CoV-2 target nucleic acids are NOT DETECTED.  The SARS-CoV-2 RNA is generally detectable in upper respiratory specimens during the acute phase of infection. The lowest concentration of SARS-CoV-2 viral copies this assay can detect is 138 copies/mL. A negative result does not preclude SARS-Cov-2 infection and should not be used as the sole basis for treatment or other patient management decisions. A negative result may occur with  improper specimen collection/handling, submission of specimen  other than nasopharyngeal swab, presence of viral mutation(s) within the areas targeted by this assay, and inadequate number of viral copies(<138 copies/mL). A negative result must be combined with clinical observations, patient history, and epidemiological information. The expected result is Negative.  Fact Sheet for Patients:  BloggerCourse.com  Fact Sheet for Healthcare Providers:  SeriousBroker.it  This test is no t yet approved or cleared by the [place] FDA and  has been authorized for detection and/or diagnosis of SARS-CoV-2 by FDA under an Emergency Use Authorization (EUA). This EUA will remain  in effect (meaning this test can be used) for the duration of the COVID-19 declaration under Section 564(b)(1) of the Act, 21 U.S.C.section 360bbb-3(b)(1), unless the authorization is terminated  or revoked sooner.       Influenza A by PCR NEGATIVE NEGATIVE Final   Influenza B by PCR NEGATIVE NEGATIVE Final    Comment: (NOTE) The Xpert Xpress SARS-CoV-2/FLU/RSV plus assay is intended as an aid in the diagnosis of influenza from Nasopharyngeal swab specimens and should not be used as a sole basis for treatment. Nasal washings and aspirates are unacceptable for Xpert Xpress SARS-CoV-2/FLU/RSV testing.  Fact Sheet for Patients: BloggerCourse.com  Fact Sheet for Healthcare Providers: SeriousBroker.it  This test is not yet approved or cleared by the [place] FDA and has been authorized for detection and/or diagnosis of SARS-CoV-2 by FDA under an Emergency Use Authorization (EUA). This EUA will remain in effect (meaning this test can be used) for the duration of the COVID-19 declaration under Section 564(b)(1) of the Act, 21 U.S.C. section 360bbb-3(b)(1), unless the authorization is terminated or revoked.  Performed at [facility], [address] [place],  [place] [address]   Urine Culture     Status: Abnormal   Collection Time: 09/24/21 11:56 PM   Specimen: Urine, Clean Catch  Result Value Ref Range Status   Specimen  Description URINE, CLEAN CATCH  Final   Special Requests   Final    NONE Performed at [facility], [address] [place], [place] [address]    Culture >=100,000 COLONIES/mL ESCHERICHIA COLI (A)  Final   Report Status 09/27/2021 FINAL  Final   Organism ID, Bacteria ESCHERICHIA COLI (A)  Final      Susceptibility   Escherichia coli - MIC*    AMPICILLIN <=2 SENSITIVE Sensitive     CEFAZOLIN <=4 SENSITIVE Sensitive     CEFEPIME <=0.12 SENSITIVE Sensitive     CEFTRIAXONE <=0.25 SENSITIVE Sensitive     CIPROFLOXACIN <=0.25 SENSITIVE Sensitive     GENTAMICIN <=1 SENSITIVE Sensitive     IMIPENEM <=0.25 SENSITIVE Sensitive     NITROFURANTOIN <=16 SENSITIVE Sensitive     TRIMETH/SULFA <=20 SENSITIVE Sensitive     AMPICILLIN/SULBACTAM <=2 SENSITIVE Sensitive     PIP/TAZO <=4 SENSITIVE Sensitive     * >=100,000 COLONIES/mL ESCHERICHIA COLI     Signed: Estil Vallee  [facility] 09/27/2021, 12:48 PM

## 2021-09-30 ENCOUNTER — Telehealth: Payer: Self-pay

## 2021-09-30 NOTE — Telephone Encounter (Signed)
Transition Care Management Follow-up Telephone Call Date of discharge and from where: 09/27/2021 Shannon Sutton How have you been since you were released from the hospital? ok Any questions or concerns? No  Items Reviewed: Did the pt receive and understand the discharge instructions provided? Yes  Medications obtained and verified? Yes  Other? No  Any new allergies since your discharge? No  Dietary orders reviewed? Yes Do you have support at home? Yes   Home Care and Equipment/Supplies: Were home health services ordered? no If so, what is the name of the agency? N/a  Has the agency set up a time to come to the patient's home? not applicable Were any new equipment or medical supplies ordered?  No What is the name of the medical supply agency? N/a Were you able to get the supplies/equipment? not applicable Do you have any questions related to the use of the equipment or supplies? No  Functional Questionnaire: (I = Independent and D = Dependent) ADLs: I  Bathing/Dressing- I  Meal Prep- I  Eating- I  Maintaining continence- I  Transferring/Ambulation- I  Managing Meds- D  Follow up appointments reviewed:  PCP Hospital f/u appt confirmed? Yes  Scheduled to see Dr. Jerilee Hoh on 10/04/2021 @ 10:30. Are transportation arrangements needed? No  If their condition worsens, is the pt aware to call PCP or go to the Emergency Dept.? Yes Was the patient provided with contact information for the PCP's office or ED? Yes Was to pt encouraged to call back with questions or concerns? Yes

## 2021-10-04 ENCOUNTER — Telehealth (HOSPITAL_COMMUNITY): Payer: Self-pay | Admitting: Internal Medicine

## 2021-10-04 ENCOUNTER — Encounter: Payer: Self-pay | Admitting: Internal Medicine

## 2021-10-04 ENCOUNTER — Ambulatory Visit (INDEPENDENT_AMBULATORY_CARE_PROVIDER_SITE_OTHER): Payer: Medicare HMO | Admitting: Internal Medicine

## 2021-10-04 ENCOUNTER — Telehealth: Payer: Self-pay | Admitting: Internal Medicine

## 2021-10-04 VITALS — BP 140/84 | HR 100 | Temp 97.5°F | Ht 61.0 in | Wt 116.7 lb

## 2021-10-04 DIAGNOSIS — G20 Parkinson's disease: Secondary | ICD-10-CM

## 2021-10-04 DIAGNOSIS — Z23 Encounter for immunization: Secondary | ICD-10-CM

## 2021-10-04 DIAGNOSIS — Z87440 Personal history of urinary (tract) infections: Secondary | ICD-10-CM

## 2021-10-04 DIAGNOSIS — Z111 Encounter for screening for respiratory tuberculosis: Secondary | ICD-10-CM | POA: Diagnosis not present

## 2021-10-04 DIAGNOSIS — N390 Urinary tract infection, site not specified: Secondary | ICD-10-CM

## 2021-10-04 DIAGNOSIS — G20A1 Parkinson's disease without dyskinesia, without mention of fluctuations: Secondary | ICD-10-CM

## 2021-10-04 DIAGNOSIS — G2581 Restless legs syndrome: Secondary | ICD-10-CM

## 2021-10-04 DIAGNOSIS — Z09 Encounter for follow-up examination after completed treatment for conditions other than malignant neoplasm: Secondary | ICD-10-CM

## 2021-10-04 DIAGNOSIS — G9340 Encephalopathy, unspecified: Secondary | ICD-10-CM | POA: Diagnosis not present

## 2021-10-04 DIAGNOSIS — B9620 Unspecified Escherichia coli [E. coli] as the cause of diseases classified elsewhere: Secondary | ICD-10-CM | POA: Diagnosis not present

## 2021-10-04 DIAGNOSIS — G4700 Insomnia, unspecified: Secondary | ICD-10-CM

## 2021-10-04 LAB — BASIC METABOLIC PANEL
BUN: 11 mg/dL (ref 6–23)
CO2: 30 mEq/L (ref 19–32)
Calcium: 9.7 mg/dL (ref 8.4–10.5)
Chloride: 102 mEq/L (ref 96–112)
Creatinine, Ser: 0.7 mg/dL (ref 0.40–1.20)
GFR: 83.26 mL/min (ref >60.00)
Glucose, Bld: 114 mg/dL — ABNORMAL HIGH (ref 70–99)
Potassium: 3.8 mEq/L (ref 3.5–5.1)
Sodium: 141 mEq/L (ref 135–145)

## 2021-10-04 LAB — CBC WITH DIFFERENTIAL/PLATELET
Basophils Absolute: 0 10*3/uL (ref 0.0–0.1)
Basophils Relative: 0.6 % (ref 0.0–3.0)
Eosinophils Absolute: 0 10*3/uL (ref 0.0–0.7)
Eosinophils Relative: 0.5 % (ref 0.0–5.0)
HCT: 41.8 % (ref 36.0–46.0)
Hemoglobin: 14.1 g/dL (ref 12.0–15.0)
Lymphocytes Relative: 26.2 % (ref 12.0–46.0)
Lymphs Abs: 1.4 10*3/uL (ref 0.7–4.0)
MCHC: 33.9 g/dL (ref 30.0–36.0)
MCV: 93.8 fl (ref 78.0–100.0)
Monocytes Absolute: 0.4 10*3/uL (ref 0.1–1.0)
Monocytes Relative: 7.9 % (ref 3.0–12.0)
Neutro Abs: 3.5 10*3/uL (ref 1.4–7.7)
Neutrophils Relative %: 64.8 % (ref 43.0–77.0)
Platelets: 246 10*3/uL (ref 150.0–400.0)
RBC: 4.45 Mil/uL (ref 3.87–5.11)
RDW: 13.1 % (ref 11.5–15.5)
WBC: 5.4 10*3/uL (ref 4.0–10.5)

## 2021-10-04 LAB — POCT URINALYSIS DIPSTICK
Bilirubin, UA: NEGATIVE
Blood, UA: NEGATIVE
Glucose, UA: NEGATIVE
Ketones, UA: 5
Leukocytes, UA: NEGATIVE
Nitrite, UA: NEGATIVE
Protein, UA: NEGATIVE
Spec Grav, UA: 1.02 (ref 1.010–1.025)
Urobilinogen, UA: 0.2 E.U./dL
pH, UA: 5.5 (ref 5.0–8.0)

## 2021-10-04 MED ORDER — CLONAZEPAM 0.5 MG PO TABS
1.0000 mg | ORAL_TABLET | Freq: Every day | ORAL | Status: DC
Start: 2021-10-04 — End: 2021-11-19

## 2021-10-04 NOTE — Telephone Encounter (Signed)
Forms have been faxed 

## 2021-10-04 NOTE — BH Assessment (Signed)
Care Management - Follow Up [facility] Discharges   Patient has been transferred to [facility] Emergency Room for medical clearance due to the patient's age and medical history.

## 2021-10-04 NOTE — Telephone Encounter (Signed)
Noted. Form will be faxed today.

## 2021-10-04 NOTE — Progress Notes (Signed)
Hospital follow-up visit     CC/Reason for Visit: Hospitalization follow-up  HPI: Shannon Sutton is a 75 y.o. female who is coming in today for the above mentioned reasons, specifically transitional care services face-to-face visit.    Dates hospitalized: 09/24/2021-09/27/2021 Days since discharge from hospital: 7 Patient was discharged from the hospital to: Home Reason for admission to hospital: Confusion/delirium Date of interactive phone contact with patient and/or caregiver: 09/30/2021  I have reviewed in detail patient's discharge summary plus pertinent specific notes, labs, and images from the hospitalization.  Yes  Patient was brought by her daughter to behavioral health walk-in services due to increased confusion and delirium.  From there she was sent to the emergency department for medical clearance where she was found to be dehydrated and to have a UTI that subsequently on culture was found to be an E. coli UTI.  She was treated with Rocephin and then transitioned over to Keflex for an additional 3 days on discharge.  She was seen by psychiatry and it was deemed that she was not in need of inpatient psychiatric services.  Daughter states she has improved but still remains confused above baseline.  She does have a history of Parkinson's disease and dementia followed by neurology both locally and by the movement disorder clinic at [facility].  There are concerns about her many sedating medications and daughter has asked me to see if we can titrate them today.  She will be moving into [name] next week and they need an FL 2 filled out.  She also needs to have TB screening.  They are requesting updated flu vaccination today.  Medication reconciliation was done today and patient is taking meds as recommended by discharging hospitalist/specialist.  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  Dr Trudie Reed 2009 had a bx/MRI: was on MTX and Prednisone   External hemorrhoids    Fibromyalgia with myofascial pain 11/16/2012   Headaches, cluster    History of CT scan of head 2022   History of EKG 2021   History of mammogram 2022   History of MRI 2022   Brain   Hypercholesterolemia 12/15/2013   Hypertension    Insomnia 11/16/2012   Myalgia and myositis, unspecified    Other disorder of muscle, ligament, and fascia    eosinophilic fascitis   Other specified disease of hair and hair follicles    Pain in joint, multiple sites    Parkinson's disease ([place])    Restless leg syndrome 12/15/2013   Unspecified sleep apnea    Urine incontinence     Past Surgical History:  Procedure Laterality Date   ABDOMINAL HYSTERECTOMY     ABDOMINOPLASTY  2006   "tummy tuck"   BREAST REDUCTION SURGERY  1995   BREAST SURGERY     CATARACT EXTRACTION, BILATERAL     COLONOSCOPY  2018   MUSCLE BIOPSY  2009   DEEP TISSUE   REFRACTIVE SURGERY     TONSILLECTOMY     TOTAL ABDOMINAL HYSTERECTOMY  1999   WISDOM TOOTH EXTRACTION      Social History:  reports that she quit smoking about 34 years ago. Her smoking  use included cigarettes. She has never used smokeless tobacco. She reports that she does not currently use alcohol. She reports that she does not use drugs.  Allergies: Allergies  Allergen Reactions   Crestor [Rosuvastatin Calcium] Other (See Comments)    abd pain   Cymbalta [Duloxetine Hcl] Nausea Only   Effexor Xr [Venlafaxine Hcl Er] Other (See Comments)    dizziness   Morphine And Related Itching    agitation   Pravastatin Other (See Comments)    abd pain   Promethazine-Codeine Itching    Itching in face   Rizatriptan Other (See Comments)    Tingling, prickly sensation, feelings of  palpitations, flushing.     Family History:  Family History  Problem Relation Age of Onset   Colon polyps Mother    Heart disease Mother    Heart attack Mother    Heart attack Father    Colon cancer Father 25   Heart disease Father    COPD Sister    Emphysema Sister    COPD Brother    Emphysema Brother    Lung cancer Brother    Breast cancer Paternal Grandmother    Heart disease Paternal Grandfather    Colon polyps Paternal Grandfather    Heart disease Other        family history   Parkinson's disease Other    Stomach cancer Neg Hx    Rectal cancer Neg Hx    Esophageal cancer Neg Hx      Current Outpatient Medications:    atorvastatin (LIPITOR) 40 MG tablet, Take 1 tablet by mouth once daily, Disp: 90 tablet, Rfl: 3   carbidopa-levodopa (SINEMET IR) 25-100 MG tablet, Take 1 tablet by mouth 3 (three) times daily., Disp: , Rfl:    donepezil (ARICEPT) 5 MG tablet, Take 1 tablet (5 mg total) by mouth at bedtime., Disp: , Rfl:    Melatonin 10 MG TABS, Take 5-10 mg by mouth at bedtime., Disp: , Rfl:    rOPINIRole (REQUIP) 1 MG tablet, Take 1 mg by mouth 3 (three) times daily., Disp: , Rfl:    traZODone (DESYREL) 100 MG tablet, Take 200 mg by mouth at bedtime., Disp: , Rfl:    clonazePAM (KLONOPIN) 0.5 MG tablet, Take 2 tablets (1 mg total) by mouth at bedtime. 2 qam & 2 at dinner, Disp: , Rfl:    ondansetron (ZOFRAN) 4 MG tablet, TAKE 1 TABLET BY MOUTH EVERY 8 HOURS AS NEEDED FOR NAUSEA OR VOMITING (Patient not taking: Reported on 10/04/2021), Disp: 45 tablet, Rfl: 0   propranolol (INDERAL) 20 MG tablet, Take 1 tablet (20 mg total) by mouth 2 (two) times daily. (Patient not taking: Reported on 10/04/2021), Disp: 60 tablet, Rfl: 2  Review of Systems:  Constitutional: Denies fever, chills, diaphoresis, appetite change and fatigue.  HEENT: Denies photophobia, eye pain, redness, hearing loss, ear pain, congestion, sore throat, rhinorrhea, sneezing, mouth sores, trouble swallowing,  neck pain, neck stiffness and tinnitus.   Respiratory: Denies SOB, DOE, cough, chest tightness,  and wheezing.   Cardiovascular: Denies chest pain, palpitations and leg swelling.  Gastrointestinal: Denies nausea, vomiting, abdominal pain, diarrhea, constipation, blood in stool and abdominal distention.  Genitourinary: Denies dysuria, urgency, frequency, hematuria, flank pain and difficulty urinating.  Endocrine: Denies: hot or cold intolerance, sweats, changes in hair or nails, polyuria, polydipsia. Musculoskeletal: Denies myalgias, back pain, joint swelling, arthralgias and gait problem.  Skin: Denies pallor, rash and wound.  Neurological: Denies dizziness, seizures, syncope, weakness, light-headedness, numbness and headaches.  Hematological: Denies adenopathy. Easy bruising, personal or family bleeding history  Psychiatric/Behavioral: Denies suicidal ideation, mood changes,  nervousness.   Physical Exam: Vitals:   10/04/21 1026  BP: 140/84  Pulse: 100  Temp: (!) 97.5 F (36.4 C)  TempSrc: Oral  SpO2: 97%  Weight: 116 lb 11.2 oz (52.9 kg)  Height: 5\' 1"  (1.549 m)    Body mass index is 22.05 kg/m.   Constitutional: NAD, calm, comfortable Eyes: PERRL, lids and conjunctivae normal, wears corrective lenses ENMT: Mucous membranes are moist.  Respiratory: clear to auscultation bilaterally, no wheezing, no crackles. Normal respiratory effort. No accessory muscle use.  Cardiovascular: Regular rate and rhythm, no murmurs / rubs / gallops. No extremity edema.    Impression and Plan:  Hospital discharge follow-up  History of UTI - Plan: POCT urinalysis dipstick  Screening-pulmonary TB - Plan: QuantiFERON-TB Gold Plus  Needs flu shot - Plan: Flu Vaccine QUAD High Dose(Fluad)  Encephalopathy  E. coli UTI - Plan: CBC with Differential/Platelet, Basic metabolic panel, Basic metabolic panel, CBC with Differential/Platelet  Restless leg syndrome  Insomnia, unspecified type -  Plan: clonazePAM (KLONOPIN) 0.5 MG tablet  Parkinson disease ([place]) - Plan: clonazePAM (KLONOPIN) 0.5 MG tablet  [facility] charts were reviewed in great detail. -She has completed antibiotic therapy. -Updated flu vaccine and quantiferon gold will be done today in order to proceed with ALF placement, FL 2 forms have been filled out. -CBC and BMP that were requested by discharging hospitalist will be ordered today and interpreted once resulted. -I agree that she is on many sedating medications including ropinirole 1 mg 3 times a day, trazodone 200 mg at bedtime, clonazepam 1 mg twice daily. -I have discussed with daughter that we will make 1 medication change at a time.  Today we have decided to discontinue the morning dose of clonazepam.  I will leave carbidopa, ropinirole, donepezil management to her neurologist. -She will follow-up in 3 months.  Medical decision making of high complexity was utilized today.    Lelon Frohlich, MD [place] [facility]

## 2021-10-04 NOTE — Telephone Encounter (Signed)
Shannon Sutton with [facility] called in to find out the progress of the [place] form. Shannon Sutton wanted to inform Dr.Hernandez that each box should be filled. If the box doesn't comply with Dr.Hernandez she wants her to put N/A in the field.  Please advise.

## 2021-10-08 ENCOUNTER — Ambulatory Visit: Payer: Medicare HMO | Admitting: Gastroenterology

## 2021-10-08 LAB — QUANTIFERON-TB GOLD PLUS
Mitogen-NIL: >10 IU/mL
NIL: 0.1 IU/mL
QuantiFERON-TB Gold Plus: NEGATIVE
TB1-NIL: <0 IU/mL
TB2-NIL: 0 IU/mL

## 2021-10-15 ENCOUNTER — Ambulatory Visit: Payer: Medicare HMO | Admitting: Gastroenterology

## 2021-10-16 ENCOUNTER — Ambulatory Visit: Payer: Medicare HMO | Admitting: Diagnostic Neuroimaging

## 2021-10-16 DIAGNOSIS — R69 Illness, unspecified: Secondary | ICD-10-CM | POA: Diagnosis not present

## 2021-10-16 DIAGNOSIS — R4189 Other symptoms and signs involving cognitive functions and awareness: Secondary | ICD-10-CM | POA: Diagnosis not present

## 2021-10-16 DIAGNOSIS — F411 Generalized anxiety disorder: Secondary | ICD-10-CM | POA: Diagnosis not present

## 2021-10-16 DIAGNOSIS — F5101 Primary insomnia: Secondary | ICD-10-CM | POA: Diagnosis not present

## 2021-10-16 DIAGNOSIS — F32A Depression, unspecified: Secondary | ICD-10-CM | POA: Diagnosis not present

## 2021-10-17 DIAGNOSIS — E559 Vitamin D deficiency, unspecified: Secondary | ICD-10-CM | POA: Diagnosis not present

## 2021-10-17 DIAGNOSIS — D519 Vitamin B12 deficiency anemia, unspecified: Secondary | ICD-10-CM | POA: Diagnosis not present

## 2021-10-17 DIAGNOSIS — E785 Hyperlipidemia, unspecified: Secondary | ICD-10-CM | POA: Diagnosis not present

## 2021-10-17 DIAGNOSIS — R7303 Prediabetes: Secondary | ICD-10-CM | POA: Diagnosis not present

## 2021-10-17 DIAGNOSIS — E039 Hypothyroidism, unspecified: Secondary | ICD-10-CM | POA: Diagnosis not present

## 2021-10-17 DIAGNOSIS — I10 Essential (primary) hypertension: Secondary | ICD-10-CM | POA: Diagnosis not present

## 2021-10-18 NOTE — Telephone Encounter (Signed)
error 

## 2021-10-21 DIAGNOSIS — F32A Depression, unspecified: Secondary | ICD-10-CM | POA: Diagnosis not present

## 2021-10-21 DIAGNOSIS — M797 Fibromyalgia: Secondary | ICD-10-CM | POA: Diagnosis not present

## 2021-10-21 DIAGNOSIS — E785 Hyperlipidemia, unspecified: Secondary | ICD-10-CM | POA: Diagnosis not present

## 2021-10-21 DIAGNOSIS — G20 Parkinson's disease: Secondary | ICD-10-CM | POA: Diagnosis not present

## 2021-10-21 DIAGNOSIS — F0280 Dementia in other diseases classified elsewhere without behavioral disturbance: Secondary | ICD-10-CM | POA: Diagnosis not present

## 2021-10-21 DIAGNOSIS — G2581 Restless legs syndrome: Secondary | ICD-10-CM | POA: Diagnosis not present

## 2021-10-21 DIAGNOSIS — R69 Illness, unspecified: Secondary | ICD-10-CM | POA: Diagnosis not present

## 2021-10-21 DIAGNOSIS — Z9181 History of falling: Secondary | ICD-10-CM | POA: Diagnosis not present

## 2021-10-22 ENCOUNTER — Other Ambulatory Visit: Payer: Self-pay

## 2021-10-22 ENCOUNTER — Ambulatory Visit (HOSPITAL_COMMUNITY): Payer: Medicare HMO | Admitting: Psychiatry

## 2021-10-24 DIAGNOSIS — F32A Depression, unspecified: Secondary | ICD-10-CM | POA: Diagnosis not present

## 2021-10-24 DIAGNOSIS — R69 Illness, unspecified: Secondary | ICD-10-CM | POA: Diagnosis not present

## 2021-10-24 DIAGNOSIS — G20 Parkinson's disease: Secondary | ICD-10-CM | POA: Diagnosis not present

## 2021-10-24 DIAGNOSIS — G2581 Restless legs syndrome: Secondary | ICD-10-CM | POA: Diagnosis not present

## 2021-10-24 DIAGNOSIS — M797 Fibromyalgia: Secondary | ICD-10-CM | POA: Diagnosis not present

## 2021-10-24 DIAGNOSIS — E785 Hyperlipidemia, unspecified: Secondary | ICD-10-CM | POA: Diagnosis not present

## 2021-10-24 DIAGNOSIS — G4700 Insomnia, unspecified: Secondary | ICD-10-CM | POA: Diagnosis not present

## 2021-10-24 DIAGNOSIS — F0393 Unspecified dementia, unspecified severity, with mood disturbance: Secondary | ICD-10-CM | POA: Diagnosis not present

## 2021-10-30 DIAGNOSIS — G20 Parkinson's disease: Secondary | ICD-10-CM | POA: Diagnosis not present

## 2021-10-30 DIAGNOSIS — G2581 Restless legs syndrome: Secondary | ICD-10-CM | POA: Diagnosis not present

## 2021-10-30 DIAGNOSIS — E785 Hyperlipidemia, unspecified: Secondary | ICD-10-CM | POA: Diagnosis not present

## 2021-10-30 DIAGNOSIS — R69 Illness, unspecified: Secondary | ICD-10-CM | POA: Diagnosis not present

## 2021-10-30 DIAGNOSIS — F32A Depression, unspecified: Secondary | ICD-10-CM | POA: Diagnosis not present

## 2021-10-30 DIAGNOSIS — Z9181 History of falling: Secondary | ICD-10-CM | POA: Diagnosis not present

## 2021-10-30 DIAGNOSIS — M797 Fibromyalgia: Secondary | ICD-10-CM | POA: Diagnosis not present

## 2021-10-30 DIAGNOSIS — F0280 Dementia in other diseases classified elsewhere without behavioral disturbance: Secondary | ICD-10-CM | POA: Diagnosis not present

## 2021-10-30 NOTE — Telephone Encounter (Signed)
error 

## 2021-11-05 DIAGNOSIS — F32A Depression, unspecified: Secondary | ICD-10-CM | POA: Diagnosis not present

## 2021-11-05 DIAGNOSIS — G20 Parkinson's disease: Secondary | ICD-10-CM | POA: Diagnosis not present

## 2021-11-05 DIAGNOSIS — M797 Fibromyalgia: Secondary | ICD-10-CM | POA: Diagnosis not present

## 2021-11-05 DIAGNOSIS — R69 Illness, unspecified: Secondary | ICD-10-CM | POA: Diagnosis not present

## 2021-11-05 DIAGNOSIS — E785 Hyperlipidemia, unspecified: Secondary | ICD-10-CM | POA: Diagnosis not present

## 2021-11-05 DIAGNOSIS — Z9181 History of falling: Secondary | ICD-10-CM | POA: Diagnosis not present

## 2021-11-05 DIAGNOSIS — F0280 Dementia in other diseases classified elsewhere without behavioral disturbance: Secondary | ICD-10-CM | POA: Diagnosis not present

## 2021-11-05 DIAGNOSIS — G2581 Restless legs syndrome: Secondary | ICD-10-CM | POA: Diagnosis not present

## 2021-11-07 ENCOUNTER — Emergency Department (HOSPITAL_COMMUNITY): Payer: Medicare HMO

## 2021-11-07 ENCOUNTER — Encounter (HOSPITAL_COMMUNITY): Payer: Self-pay

## 2021-11-07 ENCOUNTER — Other Ambulatory Visit: Payer: Self-pay

## 2021-11-07 ENCOUNTER — Emergency Department (HOSPITAL_COMMUNITY)
Admission: EM | Admit: 2021-11-07 | Discharge: 2021-11-10 | Disposition: A | Discharge status: Other Acute Inpatient Hospital | Payer: Medicare HMO | Attending: Student | Admitting: Student

## 2021-11-07 DIAGNOSIS — F332 Major depressive disorder, recurrent severe without psychotic features: Secondary | ICD-10-CM | POA: Diagnosis present

## 2021-11-07 DIAGNOSIS — S2191XA Laceration without foreign body of unspecified part of thorax, initial encounter: Secondary | ICD-10-CM | POA: Diagnosis not present

## 2021-11-07 DIAGNOSIS — S21132A Puncture wound without foreign body of left front wall of thorax without penetration into thoracic cavity, initial encounter: Secondary | ICD-10-CM | POA: Diagnosis not present

## 2021-11-07 DIAGNOSIS — Z20822 Contact with and (suspected) exposure to covid-19: Secondary | ICD-10-CM | POA: Diagnosis not present

## 2021-11-07 DIAGNOSIS — Z87891 Personal history of nicotine dependence: Secondary | ICD-10-CM | POA: Diagnosis not present

## 2021-11-07 DIAGNOSIS — I10 Essential (primary) hypertension: Secondary | ICD-10-CM | POA: Insufficient documentation

## 2021-11-07 DIAGNOSIS — Z79899 Other long term (current) drug therapy: Secondary | ICD-10-CM | POA: Diagnosis not present

## 2021-11-07 DIAGNOSIS — S1191XA Laceration without foreign body of unspecified part of neck, initial encounter: Secondary | ICD-10-CM | POA: Diagnosis not present

## 2021-11-07 DIAGNOSIS — G20 Parkinson's disease: Secondary | ICD-10-CM | POA: Diagnosis not present

## 2021-11-07 DIAGNOSIS — R456 Violent behavior: Secondary | ICD-10-CM | POA: Diagnosis not present

## 2021-11-07 DIAGNOSIS — S2020XA Contusion of thorax, unspecified, initial encounter: Secondary | ICD-10-CM | POA: Insufficient documentation

## 2021-11-07 DIAGNOSIS — F4325 Adjustment disorder with mixed disturbance of emotions and conduct: Secondary | ICD-10-CM | POA: Insufficient documentation

## 2021-11-07 DIAGNOSIS — R69 Illness, unspecified: Secondary | ICD-10-CM | POA: Diagnosis not present

## 2021-11-07 DIAGNOSIS — Y289XXA Contact with unspecified sharp object, undetermined intent, initial encounter: Secondary | ICD-10-CM | POA: Diagnosis not present

## 2021-11-07 DIAGNOSIS — R079 Chest pain, unspecified: Secondary | ICD-10-CM | POA: Diagnosis not present

## 2021-11-07 DIAGNOSIS — M419 Scoliosis, unspecified: Secondary | ICD-10-CM | POA: Diagnosis not present

## 2021-11-07 DIAGNOSIS — S21312A Laceration without foreign body of left front wall of thorax with penetration into thoracic cavity, initial encounter: Secondary | ICD-10-CM | POA: Diagnosis not present

## 2021-11-07 DIAGNOSIS — X781XXA Intentional self-harm by knife, initial encounter: Secondary | ICD-10-CM | POA: Diagnosis not present

## 2021-11-07 DIAGNOSIS — Z046 Encounter for general psychiatric examination, requested by authority: Secondary | ICD-10-CM | POA: Diagnosis not present

## 2021-11-07 DIAGNOSIS — M47814 Spondylosis without myelopathy or radiculopathy, thoracic region: Secondary | ICD-10-CM | POA: Diagnosis not present

## 2021-11-07 DIAGNOSIS — S299XXA Unspecified injury of thorax, initial encounter: Secondary | ICD-10-CM | POA: Diagnosis present

## 2021-11-07 LAB — RAPID URINE DRUG SCREEN, HOSP PERFORMED
Amphetamines: NOT DETECTED
Barbiturates: NOT DETECTED
Benzodiazepines: POSITIVE — AB
Cocaine: NOT DETECTED
Opiates: NOT DETECTED
Tetrahydrocannabinol: NOT DETECTED

## 2021-11-07 LAB — URINALYSIS, ROUTINE W REFLEX MICROSCOPIC
Bilirubin Urine: NEGATIVE
Glucose, UA: NEGATIVE mg/dL
Hgb urine dipstick: NEGATIVE
Ketones, ur: 5 mg/dL — AB
Leukocytes,Ua: NEGATIVE
Nitrite: NEGATIVE
Protein, ur: NEGATIVE mg/dL
Specific Gravity, Urine: 1.012 (ref 1.005–1.030)
pH: 6 (ref 5.0–8.0)

## 2021-11-07 MED ORDER — ROPINIROLE HCL 1 MG PO TABS
1.0000 mg | ORAL_TABLET | Freq: Three times a day (TID) | ORAL | Status: DC
  Administered 2021-11-07 – 2021-11-10 (×8): 1 mg via ORAL
  Filled 2021-11-07 (×9): qty 1

## 2021-11-07 MED ORDER — TRAZODONE HCL 50 MG PO TABS
150.0000 mg | ORAL_TABLET | Freq: Every day | ORAL | Status: DC
  Administered 2021-11-07 – 2021-11-09 (×3): 150 mg via ORAL
  Filled 2021-11-07: qty 2
  Filled 2021-11-07 (×2): qty 1

## 2021-11-07 MED ORDER — CLONAZEPAM 1 MG PO TABS
1.0000 mg | ORAL_TABLET | Freq: Every day | ORAL | Status: DC
  Administered 2021-11-07 – 2021-11-09 (×3): 1 mg via ORAL
  Filled 2021-11-07 (×2): qty 1
  Filled 2021-11-07: qty 2

## 2021-11-07 MED ORDER — TRAZODONE HCL 100 MG PO TABS: 200.0000 mg | ORAL_TABLET | Freq: Every day | ORAL | Status: DC

## 2021-11-07 MED ORDER — CARBIDOPA-LEVODOPA 25-100 MG PO TABS
1.0000 | ORAL_TABLET | Freq: Three times a day (TID) | ORAL | Status: DC
  Administered 2021-11-07 – 2021-11-10 (×8): 1 via ORAL
  Filled 2021-11-07 (×9): qty 1

## 2021-11-07 MED ORDER — DONEPEZIL HCL 5 MG PO TABS
5.0000 mg | ORAL_TABLET | Freq: Every day | ORAL | Status: DC
  Administered 2021-11-07 – 2021-11-09 (×3): 5 mg via ORAL
  Filled 2021-11-07 (×3): qty 1

## 2021-11-07 MED ORDER — LORAZEPAM 0.5 MG PO TABS
0.5000 mg | ORAL_TABLET | Freq: Once | ORAL | Status: AC
  Administered 2021-11-07: 0.5 mg via ORAL
  Filled 2021-11-07: qty 1

## 2021-11-07 NOTE — BH Assessment (Signed)
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 CSW to fax out. RN/EDP notified via epic secure chat.  [place] ED from 11/07/2021 in [place] DEPT Most recent reading at 11/07/2021  3:26 PM ED to Hosp-Admission (Discharged) from 09/24/2021 in [facility] Most recent reading at 09/25/2021 12:14 AM ED from 09/24/2021 in [facility] Most recent reading at 09/24/2021 10:03 PM  C-SSRS RISK CATEGORY No Risk No Risk Low Risk       The patient demonstrates the following risk factors for suicide: Chronic risk factors for suicide include: psychiatric disorder of adjustment disorder . Acute risk factors for suicide include: N/A. Protective factors for this patient include: responsibility to others (children, family). Considering these factors, the overall suicide risk at this point appears to be low. Patient is not appropriate for outpatient follow up.  Shannon Sutton is a 76 yo female currently at [facility] for evaluation of lacerations on neck/chest area.  Pt is currently residing at [facility] assisted living facility. Pt is accompanied by her daughter throughout assessment, who is a good historian.  Pt is a poor historian and has limited recollection of the concerning events that led to her being in the ED.  Initially (prior to her daughter coming into the room with pt) pt stated that she had no recollection at all of why she was in the ED and it was a big surprise to everyone.  Pts daughter states that pt sneaked a knife and a meat fork from her previous residence to bring to her assisted living facility. Pt has several lacerations on her  chest and both sides of her neck area. Pt states she got dizzy and fell on the knife. Pt is unable to articulate how she got the cuts on both sides of her neck if she got dizzy and fell on the knife.  Pts daughter asked pt why she hid a meat fork in her purse and pt replied "in case i need to commit suicide". Pt does not deny that she took the knife and meat fork.  Pt denies current suicidal ideation, homicidal ideation, AVH, or substance use. Pt does not feel like she is a danger to herself if she is discharged.   CCA Screening, Triage and Referral (STR)  Patient Reported Information How did you hear about [place]? Self  What Is the Reason for Your Visit/Call Today? Shannon Sutton is a 76 yo female currently at [facility] for evaluation of lacerations on neck/chest area.  Pt is currently residing at [facility] assisted living facility. Pt is accompanied by her daughter throughout assessment, who is a good historian.  Pt is a poor historian and has limited recollection of the concerning events that led to her being in the ED.  Initially (prior to her daughter coming into the room with pt) pt stated that she had no recollection at all of why she was in the ED and it was a big surprise to everyone.  Pts daughter states that pt sneaked a knife and a meat fork from her previous residence to bring to her assisted living facility. Pt has several lacerations on her chest and both sides of her neck area. Pt states she got dizzy and  fell on the knife. Pt is unable to articulate how she got the cuts on both sides of her neck if she got dizzy and fell on the knife.  Pts daughter asked pt why she hid a meat fork in her purse and pt replied "in case i need to commit suicide". Pt does not deny that she took the knife and meat fork.  Pt denies current suicidal ideation, homicidal ideation, AVH, or substance use. Pt does not feel like she is a danger to herself if she is discharged.  How Long Has This Been Causing You Problems? <Week  What Do  You Feel Would Help You the Most Today? Treatment for Depression or other mood problem   Have You Recently Had Any Thoughts About Hurting Yourself? No  Are You Planning to Commit Suicide/Harm Yourself At This time? No   Have you Recently Had Thoughts About [place]? No  Are You Planning to Harm Someone at This Time? No  Explanation: No data recorded  Have You Used Any Alcohol or Drugs in the Past 24 Hours? No  How Long Ago Did You Use Drugs or Alcohol? No data recorded What Did You Use and How Much? No data recorded  Do You Currently Have a Therapist/Psychiatrist? Yes  Name of Therapist/Psychiatrist: Dr. Casimiro Needle   Have You Been Recently Discharged From Any Office Practice or Programs? No  Explanation of Discharge From Practice/Program: No data recorded    CCA Screening Triage Referral Assessment Type of Contact: Tele-Assessment  Telemedicine Service Delivery: Telemedicine service delivery: This service was provided via telemedicine using a 2-way, interactive audio and video technology  Is this Initial or Reassessment? Initial Assessment  Date Telepsych consult ordered in [facility]:  11/07/21  Time Telepsych consult ordered in [facility]:  1638  Location of Assessment: [facility] ED  Provider Location: [facility] Assessment Services   Collateral Involvement: Pts daughter is present throughout assessment   Does Patient Have a [place]? No data recorded Name and Contact of Legal Guardian: No data recorded If Minor and Not Living with Parent(s), Who has Custody? No data recorded Is CPS involved or ever been involved? Never  Is APS involved or ever been involved? Never   Patient Determined To Be At Risk for Harm To Self or Others Based on Review of Patient Reported Information or Presenting Complaint? No (Potential self harm)  Method: No data recorded Availability of Means: No data recorded Intent: No data recorded Notification Required: No data  recorded Additional Information for Danger to Others Potential: No data recorded Additional Comments for Danger to Others Potential: No data recorded Are There Guns or Other Weapons in Your Home? No data recorded Types of Guns/Weapons: No data recorded Are These Weapons Safely Secured?                            No data recorded Who Could Verify You Are Able To Have These Secured: No data recorded Do You Have any Outstanding Charges, Pending Court Dates, Parole/Probation? No data recorded Contacted To Inform of Risk of Harm To Self or Others: No data recorded   Does Patient Present under Involuntary Commitment? No  IVC Papers Initial File Date: No data recorded  [place] of Residence: [place]   Patient Currently Receiving the Following Services: Medication Management   Determination of Need: Emergent (2 hours)   Options For Referral: Inpatient Hospitalization; [place] Patient  Reported Schizophrenia/Schizoaffective Diagnosis in Past: No   Strengths: Pt has strong family supports   Mental Health Symptoms Depression:   Sleep (too much or little); Increase/decrease in appetite; Irritability (decreased appetite and sleep)   Duration of Depressive symptoms:  Duration of Depressive Symptoms: Greater than two weeks   Mania:   None; Irritability   Anxiety:    Irritability; Sleep   Psychosis:   None   Duration of Psychotic symptoms:    Trauma:   None   Obsessions:   None   Compulsions:   None   Inattention:   Forgetful   Hyperactivity/Impulsivity:   None   Oppositional/Defiant Behaviors:   Argumentative   Emotional Irregularity:   Mood lability   Other Mood/Personality Symptoms:  No data recorded   Mental Status Exam Appearance and self-care  Stature:   Average   Weight:   Average weight   Clothing:   Neat/clean   Grooming:   Normal   Cosmetic use:   None   Posture/gait:   Tense   Motor  activity:   Not Remarkable   Sensorium  Attention:   Distractible; Confused   Concentration:   Scattered; Variable   Orientation:   Person; Object; Place   Recall/memory:   -- (Needs further assessment)   Affect and Mood  Affect:   Anxious; Blunted; Depressed   Mood:   Depressed; Irritable; Negative   Relating  Eye contact:   Staring   Facial expression:   Anxious; Tense   Attitude toward examiner:   Defensive; Guarded; Irritable; Resistant   Thought and Language  Speech flow:  Slurred; Paucity   Thought content:   Suspicious   Preoccupation:   None   Hallucinations:   None   Organization:  No data recorded  [employer] of Knowledge:   Impoverished by (Comment) (memory loss)   Intelligence:   Average   Abstraction:  No data recorded  Judgement:   Dangerous; Impaired   Reality Testing:   Distorted   Insight:   Gaps; Poor; Unaware; Uses connections   Decision Making:   Confused; Impulsive   Social Functioning  Social Maturity:   Impulsive; Irresponsible   Social Judgement:   Heedless; Impropriety   Stress  Stressors:   Illness   Coping Ability:   Deficient supports   Skill Deficits:   Responsibility   Supports:   Family     Religion: Religion/Spirituality Are You A Religious Person?:  Special educational needs teacher)  Leisure/Recreation: Leisure / Recreation Do You Have Hobbies?: Yes Leisure and Hobbies: spending time with family members and other residents  Exercise/Diet: Exercise/Diet Do You Exercise?: No Have You Gained or Lost A Significant Amount of Weight in the Past Six Months?: No Do You Follow a Special Diet?:  (UTA) Do You Have Any Trouble Sleeping?: Yes Explanation of Sleeping Difficulties: pts daughter reports that pt has frequent insomnia--rarely sleeps more than 2 hours per night   CCA Employment/Education Employment/Work Situation:   CCA Family/Childhood History Family and Relationship History: Family  history Does patient have children?: Yes How many children?: 2 How is patient's relationship with their children?: pts daughter is very supportive and accompanies pt to her assessment  Childhood History:  Childhood History By whom was/is the patient raised?:  (UTA) Did patient suffer any verbal/emotional/physical/sexual abuse as a child?:  (UTA) Has patient ever been sexually abused/assaulted/raped as an adolescent or adult?:  (UTA) Witnessed domestic violence?:  (UTA) Has patient been affected by domestic violence as an adult?:  (  UTA)  Child/Adolescent Assessment:  none   CCA Substance Use Alcohol/Drug Use: Alcohol / Drug Use Pain Medications: SEE MAR Prescriptions: SEE MAR Over the Counter: SEE MAR History of alcohol / drug use?: No history of alcohol / drug abuse (UTA) Longest period of sobriety (when/how long): UTA Negative Consequences of Use:  (UTA) Withdrawal Symptoms:  (UTA)    ASAM's:  Six Dimensions of Multidimensional Assessment  Dimension 1:  Acute Intoxication and/or Withdrawal Potential:   Dimension 1:  Description of individual's past and current experiences of substance use and withdrawal: none  Dimension 2:  Biomedical Conditions and Complications:      Dimension 3:  Emotional, Behavioral, or Cognitive Conditions and Complications:     Dimension 4:  Readiness to Change:     Dimension 5:  Relapse, Continued use, or Continued Problem Potential:     Dimension 6:  Recovery/Living Environment:     ASAM Severity Score: ASAM's Severity Rating Score: 0  ASAM Recommended Level of Treatment:     Substance use Disorder (SUD) Substance Use Disorder (SUD)  Checklist Symptoms of Substance Use:  (UTA)  Recommendations for Services/Supports/Treatments: Recommendations for Services/Supports/Treatments Recommendations For Services/Supports/Treatments: Inpatient Hospitalization (Geropsychiatric hospitalization)  Discharge Disposition:  Inpatient  hospitalization--geropsych  DSM5 Diagnoses: Patient Active Problem List   Diagnosis Date Noted   Adjustment disorder with mixed disturbance of emotions and conduct    Encephalopathy 09/25/2021   Altered mental status    Vitamin D deficiency 08/15/2020   Ataxia 11/23/2017   Dizzinesses 05/18/2017   Constipation 05/29/2016   Fatigue 08/30/2014   Generalized headaches 08/30/2014   Dyslipidemia 12/15/2013   Restless leg syndrome 12/15/2013   Anxiety 12/15/2013   Fibromyalgia with myofascial pain 11/16/2012   Insomnia 11/16/2012   Depression 11/16/2012     Referrals to Alternative Service(s): Referred to Alternative Service(s):   Place:   Date:   Time:    Referred to Alternative Service(s):   Place:   Date:   Time:    Referred to Alternative Service(s):   Place:   Date:   Time:    Referred to Alternative Service(s):   Place:   Date:   Time:     Rachel Bo [place], LCSW

## 2021-11-07 NOTE — ED Notes (Signed)
Patient being evaluated by TTS

## 2021-11-07 NOTE — Progress Notes (Addendum)
[facility]  Pt meets inpatient criteria per Per Trinna Post PA.  Referral was sent to the following facilities;    CSW sent secure chat to Caren Griffins, DO and Alethia Berthold, MD to inquire about [facility] reviewing pt.  Destination Service Provider Address Phone Fax  [facility]  [address] [place] [address] [phone]  [place], [place] [address] [phone] [facility]  [place], [place] [address] [phone]  [facility]  [place] [place] [address] [phone]  [place]  [address], [place] [address] [phone] [place]  [address], [place] [address] [phone] [place]  [address] [place] [address] [phone] [facility]  [address] [place] [address] [phone]  [facility]  [place], [place] [address] [phone]  [facility]  [address], [place] [address] [phone] [facility]  [address] [place] [address] [phone] [facility]  [place], [place] [address] [phone] [place], [place] [address] [phone] [place]  [place], [place] [address] [phone]   Situation ongoing,  CSW will follow up.   Benjaman Kindler, MSW, [facility] 11/07/2021  @ 11:59 PM

## 2021-11-07 NOTE — ED Notes (Signed)
Disposition:  Per Trinna Post PA pt meets inpatient geropsychiatric hospitalization criteria. CSW to fax out to facilities

## 2021-11-07 NOTE — ED Notes (Signed)
Daughter at bedside.

## 2021-11-07 NOTE — ED Provider Notes (Signed)
[facility] DEPT Provider Note  CSN: [id number] Arrival date & time: 11/07/21 1455  Chief Complaint(s) No chief complaint on file.  HPI Shannon Sutton is a 76 y.o. female with PMH Parkinson's disease, restless leg, anxiety who presents emergency department for evaluation of aggressive behavior and multiple lacerations over the chest and neck.  When I interviewed the patient, she gives multiple different accounts of how these happened and states that she grabbed a steak knife and fell on the knife in the kitchen causing her multiple lacerations.  She states that she is unhappy in her current living facility.  Daughter evaluated the patient this morning and found these lacerations that were previously hidden in a hoodie, concerned that they happened yesterday.  Patient arrives with a large bruise over the breast on the left, 4 similar laceration over the left chest wall, 10 cm lacerations that are superficial x2 extending across the throat, crossing midline.  Patient currently denies suicidal ideation, HI, AH, VH.  Denies illicit substance use.  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  Dr Trudie Reed 2009 had a bx/MRI: was on MTX and Prednisone   External hemorrhoids    Fibromyalgia with myofascial pain 11/16/2012   Headaches, cluster    History of CT scan of head 2022   History of EKG 2021   History of mammogram 2022   History of MRI 2022   Brain   Hypercholesterolemia 12/15/2013   Hypertension    Insomnia 11/16/2012   Myalgia and myositis, unspecified    Other disorder of muscle, ligament, and fascia    eosinophilic fascitis   Other specified disease of hair and hair follicles    Pain in joint, multiple sites     Parkinson's disease ([employer])    Restless leg syndrome 12/15/2013   Unspecified sleep apnea    Urine incontinence    Patient Active Problem List   Diagnosis Date Noted   Encephalopathy 09/25/2021   Altered mental status    Vitamin D deficiency 08/15/2020   Ataxia 11/23/2017   Dizzinesses 05/18/2017   Constipation 05/29/2016   Fatigue 08/30/2014   Generalized headaches 08/30/2014   Dyslipidemia 12/15/2013   Restless leg syndrome 12/15/2013   Anxiety 12/15/2013   Fibromyalgia with myofascial pain 11/16/2012   Insomnia 11/16/2012   Depression 11/16/2012   Home Medication(s) Prior to Admission medications   Medication Sig Start Date End Date Taking? Authorizing Provider  atorvastatin (LIPITOR) 40 MG tablet Take 1 tablet by mouth once daily 08/15/21   Buford Dresser, MD  carbidopa-levodopa (SINEMET IR) 25-100 MG tablet Take 1 tablet by mouth 3 (three) times daily.    [provider]  clonazePAM (KLONOPIN) 0.5 MG tablet Take 2 tablets (1 mg total) by mouth at bedtime. 2 qam & 2 at dinner 10/04/21   Isaac Bliss, Rayford Halsted, MD  donepezil (ARICEPT) 5 MG tablet Take 1 tablet (5 mg total) by mouth at bedtime. 06/14/21   Lauree Chandler, NP  Melatonin 10 MG TABS Take 5-10 mg by mouth at bedtime.    [provider]  ondansetron (ZOFRAN) 4 MG tablet TAKE 1 TABLET BY MOUTH EVERY 8 HOURS AS NEEDED FOR NAUSEA OR VOMITING Patient not  taking: Reported on 10/04/2021 08/26/21   Doran Stabler, MD  propranolol (INDERAL) 20 MG tablet Take 1 tablet (20 mg total) by mouth 2 (two) times daily. Patient not taking: Reported on 10/04/2021 09/27/21 12/26/21  Terrilee Croak, MD  rOPINIRole (REQUIP) 1 MG tablet Take 1 mg by mouth 3 (three) times daily.    [provider]  traZODone (DESYREL) 100 MG tablet Take 200 mg by mouth at bedtime.    [provider]                                                                                                                                     Past Surgical History Past Surgical History:  Procedure Laterality Date   ABDOMINAL HYSTERECTOMY     ABDOMINOPLASTY  2006   "tummy tuck"   BREAST REDUCTION SURGERY  1995   BREAST SURGERY     CATARACT EXTRACTION, BILATERAL     COLONOSCOPY  2018   MUSCLE BIOPSY  2009   DEEP TISSUE   REFRACTIVE SURGERY     TONSILLECTOMY     TOTAL ABDOMINAL HYSTERECTOMY  1999   WISDOM TOOTH EXTRACTION     Family History Family History  Problem Relation Age of Onset   Colon polyps Mother    Heart disease Mother    Heart attack Mother    Heart attack Father    Colon cancer Father 43   Heart disease Father    COPD Sister    Emphysema Sister    COPD Brother    Emphysema Brother    Lung cancer Brother    Breast cancer Paternal Grandmother    Heart disease Paternal Grandfather    Colon polyps Paternal Grandfather    Heart disease Other        family history   Parkinson's disease Other    Stomach cancer Neg Hx    Rectal cancer Neg Hx    Esophageal cancer Neg Hx     Social History Social History   Tobacco Use   Smoking status: Former    Types: Cigarettes    Quit date: 12/19/1986    Years since quitting: 34.9   Smokeless tobacco: Never  Vaping Use   Vaping Use: Never used  Substance Use Topics   Alcohol use: Not Currently   Drug use: No   Allergies Crestor [rosuvastatin calcium], Cymbalta [duloxetine hcl], Effexor xr [venlafaxine hcl er], Morphine and related, Pravastatin, Promethazine-codeine, and Rizatriptan  Review of Systems Review of Systems  Skin:  Positive for wound.  Psychiatric/Behavioral:  Positive for self-injury.    Physical Exam Vital Signs  I have reviewed the triage vital signs BP (!) 164/76 (BP Location: Right Arm)    Pulse 72    Temp 97.9 F (36.6 C) (Oral)    Resp 18    Ht 5\' 1"  (1.549 m)    Wt 51.3 kg    SpO2 100%  BMI 21.35 kg/m   Physical Exam Vitals and nursing note reviewed.  Constitutional:      General: She is not in acute  distress.    Appearance: She is well-developed.  HENT:     Head: Normocephalic and atraumatic.  Eyes:     Conjunctiva/sclera: Conjunctivae normal.  Cardiovascular:     Rate and Rhythm: Normal rate and regular rhythm.     Heart sounds: No murmur heard. Pulmonary:     Effort: Pulmonary effort is normal. No respiratory distress.     Breath sounds: Normal breath sounds.  Abdominal:     Palpations: Abdomen is soft.     Tenderness: There is no abdominal tenderness.  Musculoskeletal:        General: No swelling.     Cervical back: Neck supple.  Skin:    General: Skin is warm and dry.     Capillary Refill: Capillary refill takes less than 2 seconds.     Findings: Bruising and lesion present.  Neurological:     Mental Status: She is alert.  Psychiatric:        Mood and Affect: Mood normal.    ED Results and Treatments Labs (all labs ordered are listed, but only abnormal results are displayed) Labs Reviewed  URINALYSIS, ROUTINE W REFLEX MICROSCOPIC - Abnormal; Notable for the following components:      Result Value   Ketones, ur 5 (*)    All other components within normal limits  RAPID URINE DRUG SCREEN, [place]                                                                                                                          Radiology DG Chest 2 View  Result Date: 11/07/2021 CLINICAL DATA:  [place]. Clinical concern for rib fractures. Patient reports no pain. EXAM: CHEST - 2 VIEW COMPARISON:  04/19/2008 FINDINGS: Stable normal sized heart and small right basilar calcified granuloma. Clear lungs. Mild to moderate scoliosis with mild progression. Lower thoracic spine degenerative changes. No rib fracture or pneumothorax seen. IMPRESSION: No acute abnormality. Electronically Signed   By: Claudie Revering M.D.   On: 11/07/2021 17:01    Pertinent labs & imaging results that were available during my care of the patient were reviewed by me and considered in my medical decision making  (see MDM for details).  Medications Ordered in ED Medications - No data to display  Procedures .Marland KitchenLaceration Repair  Date/Time: 11/07/2021 5:48 PM Performed by: Teressa Lower, MD Authorized by: Teressa Lower, MD   Laceration details:    Location: chest.   Length (cm):  4 Treatment:    Area cleansed with:  Chlorhexidine   Debridement:  None   Undermining:  None Skin repair:    Repair method:  Steri-Strips and tissue adhesive   Number of Steri-Strips:  5 Approximation:    Approximation:  Close Repair type:    Repair type:  Simple Post-procedure details:    Dressing:  Open (no dressing)  (including critical care time)  Medical Decision Making / ED Course   This patient presents to the ED for concern of suspected self-induced lacerations and suicidal behavior, this involves an extensive number of treatment options, and is a complaint that carries with it a high risk of complications and morbidity.  The differential diagnosis includes SI, behavioral disturbance, dissatisfaction with her current living situation, rib fracture  MDM: Patient seen emergency department for evaluation of multiple self-induced lacerations.  Physical exam with a large bruise over the left breast, multiple lacerations including a 4 cm laceration over the left chest wall, 210 cm superficial lacerations that extend across the midline of the neck.  Chest x-ray unremarkable with no evidence of rib fracture.  Urinalysis negative.  Additional laboratory evaluation not required as the patient has no symptoms and vital signs are stable.  She will require emergency psychiatric evaluation for what appears to be an active suicide attempt versus behavioral disturbance.  Patient's disposition pending TTS evaluation.  Patient is medically cleared for psychiatric evaluation.  Patient is currently  voluntary, but an IVC will be placed if she attempts to leave the emergency department in the setting of an suspected active suicide attempt.    Additional history obtained: -Additional history obtained from multiple daughters -External records from outside source obtained and reviewed including: Chart review including previous notes, labs, imaging, consultation notes   Lab Tests: -I ordered, reviewed, and interpreted labs.   The pertinent results include:   Labs Reviewed  URINALYSIS, ROUTINE W REFLEX MICROSCOPIC - Abnormal; Notable for the following components:      Result Value   Ketones, ur 5 (*)    All other components within normal limits  RAPID URINE DRUG SCREEN, HOSP PERFORMED     Imaging Studies ordered: I ordered imaging studies including chest XR I independently visualized and interpreted imaging. I agree with the radiologist interpretation   Medicines ordered and prescription drug management: No orders of the defined types were placed in this encounter.   -I have reviewed the patients home medicines and have made adjustments as needed  Critical interventions none  Consultations Obtained: I requested consultation with the TTS team,  and discussed lab and imaging findings as well as pertinent plan - they recommend: recs pending  Reevaluation: After the interventions noted above, I reevaluated the patient and found that they have :stayed the same  Co morbidities that complicate the patient evaluation  Past Medical History:  Diagnosis Date   Adenomatous colon polyp 1990   Allergy    Anxiety 12/15/2013   Arthritis    Cataract    Chronic constipation    Depression 11/16/2012   Diverticulitis    Diverticulosis    Eosinophilic fasciitis 19/37/9024   Recurrent  Dr Trudie Reed 2009 had a bx/MRI: was on MTX and Prednisone   External hemorrhoids    Fibromyalgia with myofascial pain 11/16/2012   Headaches, cluster    History  of CT scan of head 2022   History of EKG  2021   History of mammogram 2022   History of MRI 2022   Brain   Hypercholesterolemia 12/15/2013   Hypertension    Insomnia 11/16/2012   Myalgia and myositis, unspecified    Other disorder of muscle, ligament, and fascia    eosinophilic fascitis   Other specified disease of hair and hair follicles    Pain in joint, multiple sites    Parkinson's disease ([place])    Restless leg syndrome 12/15/2013   Unspecified sleep apnea    Urine incontinence       Dispostion: I considered admission for this patient,  and formal recommendations for disposition status will be pending TTS evaluation     Final Clinical Impression(s) / ED Diagnoses Final diagnoses:  None     @PCDICTATION @    Teressa Lower, MD 11/07/21 1749

## 2021-11-07 NOTE — ED Notes (Signed)
Patient wanted to wait on medications.

## 2021-11-07 NOTE — ED Triage Notes (Signed)
Patient is from [facility]. Patient had cut marks on her neck and chest. When asked about cuts on her neck and chest she states she fell in the bathroom, but [employer] states where the patient showed them the bathroom, there was no way she received the cuts on her neck. Patient denies cutting herself.  Patient denies hurting herself or anyone else. Patient denies having visual or auditory hallucinations.

## 2021-11-07 NOTE — ED Notes (Signed)
Patient dressed out and belongings taken by daughter.

## 2021-11-08 DIAGNOSIS — S2191XA Laceration without foreign body of unspecified part of thorax, initial encounter: Secondary | ICD-10-CM | POA: Diagnosis not present

## 2021-11-08 DIAGNOSIS — R69 Illness, unspecified: Secondary | ICD-10-CM | POA: Diagnosis not present

## 2021-11-08 LAB — COMPREHENSIVE METABOLIC PANEL
ALT: 8 U/L (ref 0–44)
AST: 16 U/L (ref 15–41)
Albumin: 3.9 g/dL (ref 3.5–5.0)
Alkaline Phosphatase: 84 U/L (ref 38–126)
Anion gap: 7 (ref 5–15)
BUN: 6 mg/dL — ABNORMAL LOW (ref 8–23)
CO2: 29 mmol/L (ref 22–32)
Calcium: 8.5 mg/dL — ABNORMAL LOW (ref 8.9–10.3)
Chloride: 102 mmol/L (ref 98–111)
Creatinine, Ser: 0.6 mg/dL (ref 0.44–1.00)
GFR, Estimated: >60 mL/min (ref >60)
Glucose, Bld: 141 mg/dL — ABNORMAL HIGH (ref 70–99)
Potassium: 2.8 mmol/L — ABNORMAL LOW (ref 3.5–5.1)
Sodium: 138 mmol/L (ref 135–145)
Total Bilirubin: 1 mg/dL (ref 0.3–1.2)
Total Protein: 6.3 g/dL — ABNORMAL LOW (ref 6.5–8.1)

## 2021-11-08 LAB — LIPID PANEL
Cholesterol: 153 mg/dL (ref 0–200)
HDL: 54 mg/dL (ref >40)
LDL Cholesterol: 87 mg/dL (ref 0–99)
Total CHOL/HDL Ratio: 2.8 RATIO
Triglycerides: 62 mg/dL (ref <150)
VLDL: 12 mg/dL (ref 0–40)

## 2021-11-08 LAB — CBC WITH DIFFERENTIAL/PLATELET
Abs Immature Granulocytes: 0.01 10*3/uL (ref 0.00–0.07)
Basophils Absolute: 0 10*3/uL (ref 0.0–0.1)
Basophils Relative: 0 %
Eosinophils Absolute: 0.1 10*3/uL (ref 0.0–0.5)
Eosinophils Relative: 2 %
HCT: 36.3 % (ref 36.0–46.0)
Hemoglobin: 12.5 g/dL (ref 12.0–15.0)
Immature Granulocytes: 0 %
Lymphocytes Relative: 35 %
Lymphs Abs: 1.6 10*3/uL (ref 0.7–4.0)
MCH: 32.4 pg (ref 26.0–34.0)
MCHC: 34.4 g/dL (ref 30.0–36.0)
MCV: 94 fL (ref 80.0–100.0)
Monocytes Absolute: 0.4 10*3/uL (ref 0.1–1.0)
Monocytes Relative: 8 %
Neutro Abs: 2.5 10*3/uL (ref 1.7–7.7)
Neutrophils Relative %: 55 %
Platelets: 231 10*3/uL (ref 150–400)
RBC: 3.86 MIL/uL — ABNORMAL LOW (ref 3.87–5.11)
RDW: 13.2 % (ref 11.5–15.5)
WBC: 4.6 10*3/uL (ref 4.0–10.5)
nRBC: 0 % (ref 0.0–0.2)

## 2021-11-08 LAB — SALICYLATE LEVEL: Salicylate Lvl: <7 mg/dL — ABNORMAL LOW (ref 7.0–30.0)

## 2021-11-08 LAB — HEMOGLOBIN A1C
Hgb A1c MFr Bld: 4.7 % — ABNORMAL LOW (ref 4.8–5.6)
Mean Plasma Glucose: 88.19 mg/dL

## 2021-11-08 LAB — ACETAMINOPHEN LEVEL: Acetaminophen (Tylenol), Serum: <10 ug/mL — ABNORMAL LOW (ref 10–30)

## 2021-11-08 LAB — ETHANOL: Alcohol, Ethyl (B): <10 mg/dL (ref <10)

## 2021-11-08 LAB — TSH: TSH: 1.603 u[IU]/mL (ref 0.350–4.500)

## 2021-11-08 MED ORDER — OXYMETAZOLINE HCL 0.05 % NA SOLN
2.0000 | Freq: Once | NASAL | Status: AC
Start: 2021-11-08 — End: 2021-11-08
  Administered 2021-11-08: 2 via NASAL
  Filled 2021-11-08: qty 30

## 2021-11-08 MED ORDER — POTASSIUM CHLORIDE CRYS ER 20 MEQ PO TBCR: 40.0000 meq | EXTENDED_RELEASE_TABLET | Freq: Two times a day (BID) | ORAL | Status: DC

## 2021-11-08 MED ORDER — MAGNESIUM OXIDE -MG SUPPLEMENT 400 (240 MG) MG PO TABS
400.0000 mg | ORAL_TABLET | Freq: Once | ORAL | Status: AC
  Administered 2021-11-08: 400 mg via ORAL
  Filled 2021-11-08: qty 1

## 2021-11-08 MED ORDER — POTASSIUM CHLORIDE CRYS ER 20 MEQ PO TBCR
40.0000 meq | EXTENDED_RELEASE_TABLET | Freq: Once | ORAL | Status: AC
  Administered 2021-11-08: 40 meq via ORAL
  Filled 2021-11-08: qty 2

## 2021-11-08 MED ORDER — POTASSIUM CHLORIDE CRYS ER 20 MEQ PO TBCR
40.0000 meq | EXTENDED_RELEASE_TABLET | Freq: Two times a day (BID) | ORAL | Status: DC
  Administered 2021-11-08 – 2021-11-10 (×3): 40 meq via ORAL
  Filled 2021-11-08 (×5): qty 2

## 2021-11-08 MED ORDER — ACETAMINOPHEN 500 MG PO TABS
500.0000 mg | ORAL_TABLET | Freq: Four times a day (QID) | ORAL | Status: DC | PRN
  Administered 2021-11-08: 500 mg via ORAL
  Filled 2021-11-08: qty 1

## 2021-11-08 NOTE — ED Notes (Signed)
Patient up to the bathroom.

## 2021-11-08 NOTE — Progress Notes (Signed)
Patient has been denied by [facility] due to no appropriate beds available. Patient meets [facility] inpatient per Sheran Fava, NP. Patient has been faxed out to the following facilities:   [facility]  [address], [place] [address] [phone]  [place], [place] [address] [phone] [facility]  [place], [place] [address] [phone] [facility]  [place] [address] [place] [address] [phone]  [place]  [address] [place] [address] [phone] [place]  [address] [place] [address] [phone] [place]  [address] [place] [address] [phone] [facility]  [address] [place] [address] [phone]  [facility]  [place], [place] [address] [phone]  [facility]  [address] [place] [address] [phone] [facility]  [address] [place] [address] [phone] [facility]  [place], [place] [address] [phone] [place], [place] [address] [phone] [place]  [address], [place] [address] [phone] [facility]  [address] [place] [address] [place]  [facility]  [address] [place] [address] [phone] [place], MSW, LCSW-A  8:38 PM 11/08/2021

## 2021-11-08 NOTE — ED Notes (Signed)
Patient is alseep

## 2021-11-08 NOTE — ED Notes (Signed)
Patient is up to the desk asking about her Parkinson medicine. Again I explained the medicine she was given at bedtime. Patient is now laying in bed.

## 2021-11-08 NOTE — Consult Note (Addendum)
°  TTS consult received for patient, however she was not medically cleared at that time prior to faxing her out of system for inpatient psychiatric admission.  NP ordered routine labs on this patient, discovered potassium level of 2.8.  A one-time order of Klor-con 65meq was ordered.  [facility] appears Interested in this Inpatient, will revisit again tomorrow pending BMP and appropriate level of potassium.  -New orders placed for BMP.  Unfortunately patient's potassium level 2.8, with a one-time order of Kclor-con would not be sufficient enough to bring patient's levels up to within normal limits.  Will contact EDP to place orders for IV potassium replacement as per protocol for hypokalemia.

## 2021-11-08 NOTE — BH Assessment (Signed)
[facility] Assessment Progress Note   Sheran Fava, NP finds that this pt continues to meet criteria for psychiatric hospitalization at a facility providing specialty care for geriatric patients, as well as for IVC, which she has initiated.  [facility] is not able to accommodate pt at this time.  Pt was referred to a number of facilities overnight, and this Probation officer received several calls from Home at [facility] expressing interest in the pt without agreeing to accept her.  I faxed labs, new vitals, new MAR, and newly issued IVC documents to her, and followed up with a call to confirm receipt.  At 16:21 I spoke to [facility] again.  She reports that they have not ruled pt out, however, they are concerned about her potassium level.  The would like a repeat BMET and will reconsider her tomorrow.  This has been discussed with [place].  Pt has also been referred to [facility] and to [facility].  Final disposition is pending as of this writing.  Jalene Mullet, [place] Coordinator [phone]

## 2021-11-08 NOTE — ED Notes (Signed)
Report given to Katie RN at this time. Pt moved to TCU while awaiting bed placement.  Pt cooperative and calm at this time.

## 2021-11-08 NOTE — ED Notes (Signed)
Patient is resting

## 2021-11-08 NOTE — ED Notes (Signed)
Patient ws given something to eat and drink. Patient was able to walk to the bathroom.

## 2021-11-08 NOTE — ED Notes (Signed)
Patient is asleep.  

## 2021-11-08 NOTE — ED Notes (Signed)
Pt c/o headache.  Tylenol requested from MD.  See [facility] for administration.

## 2021-11-08 NOTE — ED Notes (Signed)
Caryl Pina from [facility] called for more information on pt to determine possible placement.  Pt pending placement for a BH facility at this time.

## 2021-11-08 NOTE — ED Provider Notes (Signed)
Emergency Medicine Observation Re-evaluation Note  Shannon Sutton is a 76 y.o. female, seen on rounds today.  Pt initially presented to the ED for complaints of Adjustment Disorder Currently, the patient is awaiting psychiatric placement for self-harm.  Physical Exam  BP (!) 95/48    Pulse 81    Temp 98.2 F (36.8 C) (Oral)    Resp 16    Ht 5\' 1"  (1.549 m)    Wt 51.3 kg    SpO2 95%    BMI 21.35 kg/m  Physical Exam General: Complaining of some congestion in her right nare and requesting Afrin Cardiac: No murmur Lungs: Clear Psych: Resting  ED Course / MDM  EKG:   I have reviewed the labs performed to date as well as medications administered while in observation.  Recent changes in the last 24 hours include complaining of some congestion in right nare and normally takes Afrin at home.  Requesting this.  We will order it for her.  Plan  Current plan is for awaiting placement.  Shannon Sutton is not under involuntary commitment but per previous note if she tries to leave, will need IVC.     Desarea Ohagan, Gwenyth Allegra, MD 11/08/21 1025

## 2021-11-09 DIAGNOSIS — F332 Major depressive disorder, recurrent severe without psychotic features: Secondary | ICD-10-CM | POA: Diagnosis present

## 2021-11-09 DIAGNOSIS — R69 Illness, unspecified: Secondary | ICD-10-CM | POA: Diagnosis not present

## 2021-11-09 DIAGNOSIS — S2191XA Laceration without foreign body of unspecified part of thorax, initial encounter: Secondary | ICD-10-CM | POA: Diagnosis not present

## 2021-11-09 LAB — BASIC METABOLIC PANEL
Anion gap: 8 (ref 5–15)
BUN: 9 mg/dL (ref 8–23)
CO2: 25 mmol/L (ref 22–32)
Calcium: 9 mg/dL (ref 8.9–10.3)
Chloride: 107 mmol/L (ref 98–111)
Creatinine, Ser: 0.49 mg/dL (ref 0.44–1.00)
GFR, Estimated: >60 mL/min (ref >60)
Glucose, Bld: 109 mg/dL — ABNORMAL HIGH (ref 70–99)
Potassium: 4 mmol/L (ref 3.5–5.1)
Sodium: 140 mmol/L (ref 135–145)

## 2021-11-09 LAB — MAGNESIUM: Magnesium: 2.4 mg/dL (ref 1.7–2.4)

## 2021-11-09 LAB — RESP PANEL BY RT-PCR (FLU A&B, COVID) ARPGX2
Influenza A by PCR: NEGATIVE
Influenza B by PCR: NEGATIVE
SARS Coronavirus 2 by RT PCR: NEGATIVE

## 2021-11-09 NOTE — ED Notes (Signed)
Pt has been pleasant, calm, and cooperative from 7:12 AM to the present. Pt requires hourly redirection. Pt repeatedly requests nasal spray and Parkinson's medications. Pt daughter Sharlot Gowda [phone] visited pt for several hours. Pt daughter Florene Route [phone] visited twice. Pt requested to walk, and we ambulated pt in the hospital hallways. Pt reports her eyes are sensitive to light. The lights are off in her room, and she has sunglasses to wear.

## 2021-11-09 NOTE — ED Notes (Signed)
Pt has been pleasant, calm, and cooperative from 7:10 AM to the present. Pt requires hourly redirection. Pt repeatedly requests nasal spray and Parkinson's medications. Pt daughters Florene Route [phone] and Sharlot Gowda [phone] visited pt for several hours.

## 2021-11-09 NOTE — ED Notes (Signed)
Pt do not have any belonging in 2 locker beside her black shades.

## 2021-11-09 NOTE — BH Assessment (Addendum)
Patient has been accepted to [facility].  Accepting physician is Dr. Daneil Dolin.  Attending  Physician will be Dr. Louis Meckel.  Patient has been assigned to room L35, by [facility] Charge Nurse Vinnie Level.   Call report to [phone]  Representative/Transfer Coordinator is Kerry Dory Patient pre-admitted by [facility] Patient Access Zigmund Daniel)  [facility] Staff [facility], TTS Disposition) made aware of acceptance.

## 2021-11-09 NOTE — ED Notes (Signed)
Officer Silverio Lay called and said they are unable to transport the pt today. Said to call tomorrow morning for transportation tomorrow.

## 2021-11-09 NOTE — ED Provider Notes (Addendum)
Emergency Medicine Observation Re-evaluation Note  Shannon Sutton is a 76 y.o. female, seen on rounds today.  Pt initially presented to the ED for complaints of Adjustment Disorder Currently, the patient is resting.  Physical Exam  BP 131/78 (BP Location: Left Arm)    Pulse 86    Temp 97.8 F (36.6 C) (Oral)    Resp 20    Ht 5\' 1"  (1.549 m)    Wt 51.3 kg    SpO2 99%    BMI 21.35 kg/m  Physical Exam General: resting comfortably, NAD Lungs: normal WOB Psych: currently calm and resting  ED Course / MDM  EKG:   I have reviewed the labs performed to date as well as medications administered while in observation.  Recent changes in the last 24 hours include none.  Plan  Current plan is for inpatient psychiatric placement, pending bed. Shannon Sutton is under involuntary commitment.   Of note patient was found to have mild hypokalemia of 2.8, she was given oral replacement.  Would recommend repeat blood work in the following week but nothing more to do from an acute standpoint.  Patient is medically cleared for psychiatric disposition.    Lorelle Gibbs, DO 11/09/21 [place], [place], DO 11/09/21 1229

## 2021-11-09 NOTE — Progress Notes (Signed)
CSW spoke with Kerry Dory with [facility] who advised that this patient is under review for possible placement.    Glennie [place], MSW, [place], LCAS-A Phone: [phone] Disposition/TOC

## 2021-11-10 ENCOUNTER — Encounter: Payer: Self-pay | Admitting: Family Medicine

## 2021-11-10 ENCOUNTER — Other Ambulatory Visit: Payer: Self-pay

## 2021-11-10 ENCOUNTER — Inpatient Hospital Stay
Admission: RE | Admit: 2021-11-10 | Discharge: 2021-11-19 | DRG: 885 | Disposition: A | Discharge status: Home / Self Care | Payer: Medicare HMO | Source: Intra-hospital | Attending: Family Medicine | Admitting: Family Medicine

## 2021-11-10 DIAGNOSIS — S21312A Laceration without foreign body of left front wall of thorax with penetration into thoracic cavity, initial encounter: Secondary | ICD-10-CM | POA: Diagnosis not present

## 2021-11-10 DIAGNOSIS — S299XXA Unspecified injury of thorax, initial encounter: Secondary | ICD-10-CM | POA: Diagnosis not present

## 2021-11-10 DIAGNOSIS — F332 Major depressive disorder, recurrent severe without psychotic features: Principal | ICD-10-CM | POA: Diagnosis present

## 2021-11-10 DIAGNOSIS — F0283 Dementia in other diseases classified elsewhere, unspecified severity, with mood disturbance: Secondary | ICD-10-CM | POA: Diagnosis not present

## 2021-11-10 DIAGNOSIS — S1191XA Laceration without foreign body of unspecified part of neck, initial encounter: Secondary | ICD-10-CM | POA: Diagnosis not present

## 2021-11-10 DIAGNOSIS — Z87891 Personal history of nicotine dependence: Secondary | ICD-10-CM

## 2021-11-10 DIAGNOSIS — F0284 Dementia in other diseases classified elsewhere, unspecified severity, with anxiety: Secondary | ICD-10-CM | POA: Diagnosis not present

## 2021-11-10 DIAGNOSIS — I10 Essential (primary) hypertension: Secondary | ICD-10-CM | POA: Diagnosis not present

## 2021-11-10 DIAGNOSIS — Z046 Encounter for general psychiatric examination, requested by authority: Secondary | ICD-10-CM | POA: Diagnosis not present

## 2021-11-10 DIAGNOSIS — G20 Parkinson's disease: Secondary | ICD-10-CM | POA: Diagnosis present

## 2021-11-10 DIAGNOSIS — Z20822 Contact with and (suspected) exposure to covid-19: Secondary | ICD-10-CM | POA: Diagnosis not present

## 2021-11-10 DIAGNOSIS — R69 Illness, unspecified: Secondary | ICD-10-CM | POA: Diagnosis not present

## 2021-11-10 DIAGNOSIS — G4700 Insomnia, unspecified: Secondary | ICD-10-CM | POA: Diagnosis not present

## 2021-11-10 DIAGNOSIS — S2020XA Contusion of thorax, unspecified, initial encounter: Secondary | ICD-10-CM | POA: Diagnosis not present

## 2021-11-10 DIAGNOSIS — Z818 Family history of other mental and behavioral disorders: Secondary | ICD-10-CM

## 2021-11-10 DIAGNOSIS — Z79899 Other long term (current) drug therapy: Secondary | ICD-10-CM | POA: Diagnosis not present

## 2021-11-10 DIAGNOSIS — Z9152 Personal history of nonsuicidal self-harm: Secondary | ICD-10-CM | POA: Diagnosis not present

## 2021-11-10 DIAGNOSIS — G2581 Restless legs syndrome: Secondary | ICD-10-CM | POA: Diagnosis not present

## 2021-11-10 MED ORDER — MAGNESIUM HYDROXIDE 400 MG/5ML PO SUSP
30.0000 mL | Freq: Every day | ORAL | Status: DC | PRN
  Administered 2021-11-11: 30 mL via ORAL
  Filled 2021-11-10: qty 30

## 2021-11-10 MED ORDER — ACETAMINOPHEN 325 MG PO TABS: 650.0000 mg | ORAL_TABLET | Freq: Four times a day (QID) | ORAL | Status: DC | PRN

## 2021-11-10 MED ORDER — ROPINIROLE HCL 1 MG PO TABS
1.0000 mg | ORAL_TABLET | Freq: Three times a day (TID) | ORAL | Status: DC
  Administered 2021-11-10 – 2021-11-19 (×28): 1 mg via ORAL
  Filled 2021-11-10 (×29): qty 1

## 2021-11-10 MED ORDER — CLONAZEPAM 0.5 MG PO TABS
1.0000 mg | ORAL_TABLET | Freq: Every day | ORAL | Status: DC
  Administered 2021-11-10 – 2021-11-17 (×8): 1 mg via ORAL
  Filled 2021-11-10 (×8): qty 2

## 2021-11-10 MED ORDER — ACETAMINOPHEN 500 MG PO TABS
500.0000 mg | ORAL_TABLET | Freq: Four times a day (QID) | ORAL | Status: DC | PRN
  Administered 2021-11-13 – 2021-11-16 (×3): 500 mg via ORAL
  Filled 2021-11-10 (×3): qty 1

## 2021-11-10 MED ORDER — FLUTICASONE PROPIONATE 50 MCG/ACT NA SUSP
1.0000 | Freq: Every day | NASAL | Status: DC
  Administered 2021-11-10 – 2021-11-19 (×11): 1 via NASAL
  Filled 2021-11-10: qty 16

## 2021-11-10 MED ORDER — DONEPEZIL HCL 5 MG PO TABS
5.0000 mg | ORAL_TABLET | Freq: Every day | ORAL | Status: DC
  Administered 2021-11-10 – 2021-11-18 (×9): 5 mg via ORAL
  Filled 2021-11-10 (×10): qty 1

## 2021-11-10 MED ORDER — CARBIDOPA-LEVODOPA 25-100 MG PO TABS
1.0000 | ORAL_TABLET | Freq: Three times a day (TID) | ORAL | Status: DC
Start: 2021-11-10 — End: 2021-11-19
  Administered 2021-11-10 – 2021-11-19 (×28): 1 via ORAL
  Filled 2021-11-10 (×28): qty 1

## 2021-11-10 MED ORDER — POTASSIUM CHLORIDE CRYS ER 20 MEQ PO TBCR
40.0000 meq | EXTENDED_RELEASE_TABLET | Freq: Two times a day (BID) | ORAL | Status: AC
  Administered 2021-11-10: 40 meq via ORAL
  Filled 2021-11-10: qty 2

## 2021-11-10 MED ORDER — ALUM & MAG HYDROXIDE-SIMETH 200-200-20 MG/5ML PO SUSP
30.0000 mL | ORAL | Status: DC | PRN
  Administered 2021-11-13 – 2021-11-17 (×2): 30 mL via ORAL
  Filled 2021-11-10 (×2): qty 30

## 2021-11-10 MED ORDER — TRAZODONE HCL 50 MG PO TABS
150.0000 mg | ORAL_TABLET | Freq: Every day | ORAL | Status: DC
  Administered 2021-11-10 – 2021-11-11 (×2): 150 mg via ORAL
  Filled 2021-11-10 (×2): qty 3

## 2021-11-10 NOTE — BHH Suicide Risk Assessment (Addendum)
[facility] Admission Suicide Risk Assessment   Nursing information obtained from:    Demographic factors:    Current Mental Status:    Loss Factors:    Historical Factors:    Risk Reduction Factors:     Total Time spent with patient: 1 hour Principal Problem: Major depressive disorder, recurrent severe without psychotic features (Guys) Diagnosis:   Status post self-inflicted lacerations Unspecified depressive disorder Rule out major depressive disorder Parkinson's disease Major neurocognitive disorder, mild to moderate Restless leg syndrome R/o Lewy Body Dementia  Subjective Data:  Patient is 76 year old divorced, retired, mother of 2 Caucasian female residing at [facility] assisted living facility was urgently brought to [facility] emergency department on November 07, 2021 for evaluation of suicide due to lacerations on her chest wall and midline neck.  Patient had denied thoughts of suicide and had reported various accounts how this had occurred.  Therefore she was placed on IVC and transferred to [facility] for further evaluation and care.   Patient reports predominantly feeling happy and denies having any stressors at this time.  She does say that she would like to come and go as she pleases which are against the policies there.  She does not recall how long she has been there.  Patient is alert and oriented x3 however does have cognitive problems.  She carries a diagnosis of Parkinson's disease, restless leg syndrome as well as anxiety and depression.  This is her first psychiatric admission.   Patient says that she took a couple of knives because she wanted to make steak at the assisted living facility.  She then says that she "Bluffed" to her daughter that she would do it and then accidentally ended up falling on a knife.  She is unable to tell me how she fell multiple times due to lacerations on different areas.  She reports no depression irritability or anxiety  recently.  Denies thoughts of suicide.   I did speak with her daughter at [phone] who states that patient has always struggled with depression in her life and was started on Prozac many years ago with great benefit but was taken off by her psychiatrist in 2020 after she was diagnosed with Parkinson's disease.  Patient does not know what medications he is taking.  It seems that she is on Klonopin and she was positive for benzos.  Her daughter states that she was fully independent living on her own prior to a month and a half ago but family noticed that she would sometimes take too much of her medications and so decided to move her into assisted living facility.  As the family was making arrangements, patient grew increasingly frustrated and threatened suicide while pulling out a pair of scissors.  Family had removed all scissors and syrup objects from her home at that time.  A meat fork was also later discovered in her purse.  Last Wednesday, she requested a Band-Aid from her daughter, then eventually saw the lacerations the next day when she went to visit.   Daughter believes that she does not sleep despite taking trazodone.  Family wonders about [place] body dementia though patient denies psychosis.  Patient says that she feels safe here.  No problems with anger or mood swings.   Patient reports having problems with light sensitivity and is currently wearing shades  Continued Clinical Symptoms:  Alcohol Use Disorder Identification Test Final Score (AUDIT): 0 The "Alcohol Use Disorders Identification Test", Guidelines for Use in Primary Care, Second  Edition.  World Pharmacologist [facility]). Score between 0-7:  no or low risk or alcohol related problems. Score between 8-15:  moderate risk of alcohol related problems. Score between 16-19:  high risk of alcohol related problems. Score 20 or above:  warrants further diagnostic evaluation for alcohol dependence and treatment.   CLINICAL FACTORS:    Severe Anxiety and/or Agitation   Musculoskeletal: Strength & Muscle Tone: within normal limits Gait & Station: normal Patient leans: Left  Psychiatric Specialty Exam:  Presentation  General Appearance: Appropriate for Environment; Casual  Eye Contact:Fair  Speech:Clear and Coherent; Normal Rate  Speech Volume:Normal  Handedness:No data recorded  Mood and Affect  Mood:Depressed; Anxious  Affect:Congruent   Thought Process  Thought Processes:Coherent  Descriptions of Associations:Intact  Orientation:Partial  Thought Content:Logical  History of Schizophrenia/Schizoaffective disorder:No  Duration of Psychotic Symptoms:No data recorded Hallucinations:No data recorded Ideas of Reference:None  Suicidal Thoughts:No data recorded Homicidal Thoughts:No data recorded  Sensorium  Memory:Immediate Fair; Recent Fair; Remote Fair  Judgment:Impaired  Insight:Present   Executive Functions  Concentration:Fair  Attention Span:Fair  [place]  Language:Good   Psychomotor Activity  Psychomotor Activity:No data recorded  Assets  Assets:Desire for Improvement; Financial Resources/Insurance; Housing; Social Support   Sleep  Sleep:No data recorded   Physical Exam: Physical Exam ROS Blood pressure 117/66, pulse (!) 109, temperature 97.9 F (36.6 C), temperature source Oral, resp. rate 18, height 5\' 1"  (1.549 m), weight 48.5 kg, SpO2 100 %. Body mass index is 20.22 kg/m.   COGNITIVE FEATURES THAT CONTRIBUTE TO RISK:  Closed-mindedness    SUICIDE RISK:   Severe:  Frequent, intense, and enduring suicidal ideation, specific plan, no subjective intent, but some objective markers of intent (i.e., choice of lethal method), the method is accessible, some limited preparatory behavior, evidence of impaired self-control, severe dysphoria/symptomatology, multiple risk factors present, and few if any protective factors, particularly a lack  of social support.  PLAN OF CARE:  Restart home medications Consider tapering off Klonopin over the long-term, if possible Spoke with patient's daughter Consider addition of an SSRI Patient is on involuntary commitment Provide the patient with structure and support Establish and maintain a therapeutic alliance with the patient  I certify that inpatient services furnished can reasonably be expected to improve the patient's condition.   Rulon Sera, MD 11/10/2021, 12:47 PM
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
## 2021-11-10 NOTE — Tx Team (Signed)
Initial Treatment Plan 11/10/2021 1:15 PM Wyman Songster IPP:[id number]    PATIENT STRESSORS: Loss of independence     PATIENT STRENGTHS: Supportive family/friends    PATIENT IDENTIFIED PROBLEMS: Not being able to drive  Being told what to do   Not being able to do what I want when I want                 DISCHARGE CRITERIA:  Improved stabilization in mood, thinking, and/or behavior Reduction of life-threatening or endangering symptoms to within safe limits  PRELIMINARY DISCHARGE PLAN: Outpatient therapy Return to previous living arrangement  PATIENT/FAMILY INVOLVEMENT: This treatment plan has been presented to and reviewed with the patient, Shannon Sutton.  The patient  have been given the opportunity to ask questions and make suggestions.  Dillon Mcreynolds, RN 11/10/2021, 1:15 PM

## 2021-11-10 NOTE — ED Notes (Signed)
Patient DC d off unit to facility per provider. Patient alert, calm, cooperative, no s/s of distress. DC information and belongings given to patient for transport. Patient ambulatory off unit, escorted and transported by sheriff.

## 2021-11-10 NOTE — Progress Notes (Signed)
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 Patient is alert, oriented to person, place and situation. Calm and cooperative during assessment. Denies SI, HI, AVH, but verbalized depression of 5/10 and anxiety of 8/10. Patient stated "I'm anxious because I don't know what's going to happen". Verbally contract for safety while on the unit. Denies pain or discomfort. Verbalize stressors of "not being able to drive", "do what I want to do when I want to do it". Patient report " I bluffed while having an argument with my daughter, they thought I was trying to kill myself". Body assessment done with 2nd RN, Estill Bamberg patient noted with laceration to left and right side of neck, lacerations to left chest wall covered with steri-strip, bruising to right breast, bruising to left great toe. Old surgical scar to right and left breast. Patient is incontinent. Orientation provided to the unit. Patient remain safe on the unit with q15 minutes safety checks.

## 2021-11-10 NOTE — H&P (Signed)
Psychiatric Admission Assessment Adult  Patient Identification: Shannon Sutton MRN:  [id number] Date of Evaluation:  11/10/2021 Chief Complaint:  S/I  Diagnosis:  Status post self-inflicted lacerations Unspecified depressive disorder Rule out major depressive disorder Parkinson's disease Major neurocognitive disorder, mild to moderate Restless leg syndrome R/o Lewy Body Dementia  History of Present Illness:  Patient is 76 year old divorced, retired, mother of 2 Caucasian female residing at [facility] assisted living facility was urgently brought to [facility] emergency department on November 07, 2021 for evaluation of suicide due to lacerations on her chest wall and midline neck.  Patient had denied thoughts of suicide and had reported various accounts how this had occurred.  Therefore she was placed on IVC and transferred to [facility] for further evaluation and care.  Patient reports predominantly feeling happy and denies having any stressors at this time.  She does say that she would like to come and go as she pleases which are against the policies there.  She does not recall how long she has been there.  Patient is alert and oriented x3 however does have cognitive problems.  She carries a diagnosis of Parkinson's disease, restless leg syndrome as well as anxiety and depression.  This is her first psychiatric admission.  Patient says that she took a couple of knives because she wanted to make steak at the assisted living facility.  She then says that she "Bluffed" to her daughter that she would do it and then accidentally ended up falling on a knife.  She is unable to tell me how she fell multiple times due to lacerations on different areas.  She reports no depression irritability or anxiety recently.  Denies thoughts of suicide.  I did speak with her daughter at [phone] who states that patient has always struggled with depression in her life and was started on  Prozac many years ago with great benefit but was taken off by her psychiatrist in 2020 after she was diagnosed with Parkinson's disease.  Patient does not know what medications he is taking.  It seems that she is on Klonopin and she was positive for benzos.  Her daughter states that she was fully independent living on her own prior to a month and a half ago but family noticed that she would sometimes take too much of her medications and so decided to move her into assisted living facility.  As the family was making arrangements, patient grew increasingly frustrated and threatened suicide while pulling out a pair of scissors.  Family had removed all scissors and syrup objects from her home at that time.  A meat fork was also later discovered in her purse.  Last Wednesday, she requested a Band-Aid from her daughter, then eventually saw the lacerations the next day when she went to visit.  Daughter believes that she does not sleep despite taking trazodone.  Family wonders about [place] body dementia though patient denies psychosis.  Patient says that she feels safe here.  No problems with anger or mood swings.  Patient reports having problems with light sensitivity and is currently wearing shades  Total Time spent with patient: 1.5 hours  Past Psychiatric History: No psychiatrist at this time. Used to Dr. Sabas Sous. No hx of suicide attempt.   Is the patient at risk to self? Yes.    Has the patient been a risk to self in the past 6 months? Yes.    Has the patient been a risk to self within the distant past?  Yes.    Is the patient a risk to others? Yes.    Has the patient been a risk to others in the past 6 months? No.  Has the patient been a risk to others within the distant past? No.   Prior Inpatient Therapy:   Prior Outpatient Therapy:    Alcohol Screening:   Substance Abuse History in the last 12 months:  Yes.   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  Dr Trudie Reed 2009 had a bx/MRI: was on MTX and Prednisone   External hemorrhoids    Fibromyalgia with myofascial pain 11/16/2012   Headaches, cluster    History of CT scan of head 2022   History of EKG 2021   History of mammogram 2022   History of MRI 2022   Brain   Hypercholesterolemia 12/15/2013   Hypertension    Insomnia 11/16/2012   Myalgia and myositis, unspecified    Other disorder of muscle, ligament, and fascia    eosinophilic fascitis   Other specified disease of hair and hair follicles    Pain in joint, multiple sites    Parkinson's disease ([place])    Restless leg syndrome 12/15/2013   Unspecified sleep apnea    Urine incontinence     Past Surgical History:  Procedure Laterality Date   ABDOMINAL HYSTERECTOMY     ABDOMINOPLASTY  2006   "tummy tuck"   BREAST REDUCTION SURGERY  1995   BREAST SURGERY     CATARACT EXTRACTION, BILATERAL     COLONOSCOPY  2018   MUSCLE BIOPSY  2009   DEEP TISSUE   REFRACTIVE SURGERY     TONSILLECTOMY     TOTAL ABDOMINAL HYSTERECTOMY  1999   WISDOM TOOTH EXTRACTION     Family History:  Family History  Problem Relation Age of Onset   Colon polyps Mother    Heart disease Mother    Heart attack Mother    Heart attack Father    Colon cancer Father 70   Heart disease Father    COPD Sister    Emphysema Sister    COPD Brother    Emphysema Brother    Lung cancer Brother    Breast cancer Paternal Grandmother    Heart disease Paternal Grandfather    Colon polyps Paternal Grandfather    Heart disease Other        family history   Parkinson's disease Other    Stomach cancer Neg Hx    Rectal cancer Neg Hx     Esophageal cancer Neg Hx    Family Psychiatric  History: sister had depression.  Tobacco Screening:   Social History:  Social History   Substance and Sexual Activity  Alcohol Use Not Currently     Social History   Substance and Sexual Activity  Drug Use No    Additional Social History:                           Allergies:   Allergies  Allergen Reactions   Crestor [Rosuvastatin Calcium] Other (See Comments)    abd pain  Cymbalta [Duloxetine Hcl] Nausea Only   Effexor Xr [Venlafaxine Hcl Er] Other (See Comments)    dizziness   Morphine And Related Itching    agitation   Pravastatin Other (See Comments)    abd pain   Promethazine-Codeine Itching    Itching in face   Rizatriptan Other (See Comments)    Tingling, prickly sensation, feelings of palpitations, flushing.    Lab Results:  Results for orders placed or performed during the hospital encounter of 11/07/21 (from the past 48 hour(s))  Basic metabolic panel     Status: Abnormal   Collection Time: 11/09/21  4:49 AM  Result Value Ref Range   Sodium 140 135 - 145 mmol/L   Potassium 4.0 3.5 - 5.1 mmol/L    Comment: DELTA CHECK NOTED NO VISIBLE HEMOLYSIS    Chloride 107 98 - 111 mmol/L   CO2 25 22 - 32 mmol/L   Glucose, Bld 109 (H) 70 - 99 mg/dL    Comment: Glucose reference range applies only to samples taken after fasting for at least 8 hours.   BUN 9 8 - 23 mg/dL   Creatinine, Ser 0.49 0.44 - 1.00 mg/dL   Calcium 9.0 8.9 - 10.3 mg/dL   GFR, Estimated >60 >60 mL/min    Comment: (NOTE) Calculated using the CKD-EPI Creatinine Equation (2021)    Anion gap 8 5 - 15    Comment: Performed at [facility], [place] [address], [place], [place] [address]  Resp Panel by RT-PCR (Flu A&B, Covid) Nasopharyngeal Swab     Status: None   Collection Time: 11/09/21 12:30 PM   Specimen: Nasopharyngeal Swab; Nasopharyngeal(NP) swabs in vial transport medium  Result Value Ref Range   SARS Coronavirus 2  by RT PCR NEGATIVE NEGATIVE    Comment: (NOTE) SARS-CoV-2 target nucleic acids are NOT DETECTED.  The SARS-CoV-2 RNA is generally detectable in upper respiratory specimens during the acute phase of infection. The lowest concentration of SARS-CoV-2 viral copies this assay can detect is 138 copies/mL. A negative result does not preclude SARS-Cov-2 infection and should not be used as the sole basis for treatment or other patient management decisions. A negative result may occur with  improper specimen collection/handling, submission of specimen other than nasopharyngeal swab, presence of viral mutation(s) within the areas targeted by this assay, and inadequate number of viral copies(<138 copies/mL). A negative result must be combined with clinical observations, patient history, and epidemiological information. The expected result is Negative.  Fact Sheet for Patients:  EntrepreneurPulse.com.au  Fact Sheet for Healthcare Providers:  IncredibleEmployment.be  This test is no t yet approved or cleared by the [place] FDA and  has been authorized for detection and/or diagnosis of SARS-CoV-2 by FDA under an Emergency Use Authorization (EUA). This EUA will remain  in effect (meaning this test can be used) for the duration of the COVID-19 declaration under Section 564(b)(1) of the Act, 21 U.S.C.section 360bbb-3(b)(1), unless the authorization is terminated  or revoked sooner.       Influenza A by PCR NEGATIVE NEGATIVE   Influenza B by PCR NEGATIVE NEGATIVE    Comment: (NOTE) The Xpert Xpress SARS-CoV-2/FLU/RSV plus assay is intended as an aid in the diagnosis of influenza from Nasopharyngeal swab specimens and should not be used as a sole basis for treatment. Nasal washings and aspirates are unacceptable for Xpert Xpress SARS-CoV-2/FLU/RSV testing.  Fact Sheet for Patients: EntrepreneurPulse.com.au  Fact Sheet for Healthcare  Providers: IncredibleEmployment.be  This test is not yet approved  or cleared by the [place] and has been authorized for detection and/or diagnosis of SARS-CoV-2 by FDA under an Emergency Use Authorization (EUA). This EUA will remain in effect (meaning this test can be used) for the duration of the COVID-19 declaration under Section 564(b)(1) of the Act, 21 U.S.C. section 360bbb-3(b)(1), unless the authorization is terminated or revoked.  Performed at [facility], [place] [address], [place], [place] [address]     Blood Alcohol level:  Lab Results  Component Value Date   [facility] <10 11/08/2021   ETH <10 82/99/3716    Metabolic Disorder Labs:  Lab Results  Component Value Date   HGBA1C 4.7 (L) 11/08/2021   MPG 88.19 11/08/2021   No results found for: PROLACTIN Lab Results  Component Value Date   CHOL 153 11/08/2021   TRIG 62 11/08/2021   HDL 54 11/08/2021   CHOLHDL 2.8 11/08/2021   VLDL 12 11/08/2021   LDLCALC 87 11/08/2021   LDLCALC 69 06/20/2020    Current Medications: Current Facility-Administered Medications  Medication Dose Route Frequency Provider Last Rate Last Admin   acetaminophen (TYLENOL) tablet 500 mg  500 mg Oral Q6H PRN Patrecia Pour, NP       alum & mag hydroxide-simeth (MAALOX/MYLANTA) 200-200-20 MG/5ML suspension 30 mL  30 mL Oral Q4H PRN Patrecia Pour, NP       carbidopa-levodopa (SINEMET IR) 25-100 MG per tablet immediate release 1 tablet  1 tablet Oral TID Patrecia Pour, NP       clonazePAM Bobbye [place]) tablet 1 mg  1 mg Oral QHS Patrecia Pour, NP       donepezil (ARICEPT) tablet 5 mg  5 mg Oral QHS Patrecia Pour, NP       magnesium hydroxide (MILK OF MAGNESIA) suspension 30 mL  30 mL Oral Daily PRN Patrecia Pour, NP       potassium chloride SA (KLOR-CON M) CR tablet 40 mEq  40 mEq Oral BID Patrecia Pour, NP       rOPINIRole (REQUIP) tablet 1 mg  1 mg Oral TID Patrecia Pour, NP       traZODone  (DESYREL) tablet 150 mg  150 mg Oral QHS Patrecia Pour, NP       PTA Medications: Medications Prior to Admission  Medication Sig Dispense Refill Last Dose   atorvastatin (LIPITOR) 40 MG tablet Take 1 tablet by mouth once daily (Patient taking differently: Take 40 mg by mouth daily.) 90 tablet 3    carbidopa-levodopa (SINEMET IR) 25-100 MG tablet Take 1 tablet by mouth 3 (three) times daily.      clonazePAM (KLONOPIN) 0.5 MG tablet Take 2 tablets (1 mg total) by mouth at bedtime. 2 qam & 2 at dinner (Patient taking differently: Take 1 mg by mouth at bedtime.)      donepezil (ARICEPT) 5 MG tablet Take 1 tablet (5 mg total) by mouth at bedtime.      Melatonin 10 MG TABS Take 5-10 mg by mouth at bedtime.      ondansetron (ZOFRAN) 4 MG tablet TAKE 1 TABLET BY MOUTH EVERY 8 HOURS AS NEEDED FOR NAUSEA OR VOMITING (Patient taking differently: Take 4 mg by mouth every 8 (eight) hours as needed.) 45 tablet 0    propranolol (INDERAL) 20 MG tablet Take 1 tablet (20 mg total) by mouth 2 (two) times daily. (Patient taking differently: Take 20 mg by mouth every evening.) 60 tablet 2    rOPINIRole (  REQUIP) 1 MG tablet Take 1 mg by mouth 3 (three) times daily.      traZODone (DESYREL) 150 MG tablet Take 150 mg by mouth at bedtime.       Musculoskeletal: Strength & Muscle Tone: within normal limits Gait & Station: normal Patient leans: Right            Psychiatric Specialty Exam:  Presentation  General Appearance: Appropriate for Environment; Casual  Eye Contact:Fair  Speech:Clear and Coherent; Normal Rate  Speech Volume:Normal  Handedness:No data recorded  Mood and Affect  Mood:Depressed; Anxious  Affect:Congruent   Thought Process  Thought Processes:Coherent  Duration of Psychotic Symptoms: No data recorded Past Diagnosis of Schizophrenia or Psychoactive disorder: No  Descriptions of Associations:Intact  Orientation:Partial  Thought Content:Logical  Hallucinations:No  data recorded Ideas of Reference:None  Suicidal Thoughts:No data recorded Homicidal Thoughts:No data recorded  Sensorium  Memory:Immediate Fair; Recent Fair; Remote Fair  Judgment:Impaired  Insight:Present   Executive Functions  Concentration:poor Attention Span:Fair  [place] of Knowledge:poor Language:Good   Psychomotor Activity  Psychomotor Activity:No data recorded  Assets  Assets:Desire for Improvement; Financial Resources/Insurance; Housing; Social Support   Sleep  Sleep:No data recorded   Physical Exam: Physical Exam ROS Blood pressure 117/66, pulse (!) 109, temperature 97.9 F (36.6 C), temperature source Oral, resp. rate 18, height 5\' 1"  (1.549 m), weight 48.5 kg, SpO2 100 %. Body mass index is 20.22 kg/m.  Treatment Plan Summary: Daily contact with patient to assess and evaluate symptoms and progress in treatment and Medication management  Observation Level/Precautions:  Continuous Observation  Laboratory:    Psychotherapy:    Medications:    Consultations:    Discharge Concerns:    Estimated LOS:  Other:     Physician Treatment Plan for Primary Diagnosis: Major depressive disorder, recurrent severe without psychotic features ([place]) Long Term Goal(s): Improvement in symptoms so as ready for discharge  Short Term Goals: Ability to identify changes in lifestyle to reduce recurrence of condition will improve  Physician Treatment Plan for Secondary Diagnosis: Principal Problem:   Major depressive disorder, recurrent severe without psychotic features ([place])  Long Term Goal(s): Improvement in symptoms so as ready for discharge  Short Term Goals: Ability to demonstrate self-control will improve  Plan Restart home medications Consider tapering off Klonopin over the long-term, if possible Spoke with patient's daughter Consider addition of an SSRI Patient is on involuntary commitment Provide the patient with structure and  support Establish and maintain a therapeutic alliance with the patient  I certify that inpatient services furnished can reasonably be expected to improve the patient's condition.    Rulon Sera, MD 1/29/202312:25 PM

## 2021-11-10 NOTE — Progress Notes (Signed)
Patient compliant with medications denies SI/HI/A/VH and verbally contracted for safety. Patient has been in her room most of this shift. Endorses anxiety 4/10 and depression 7/10.   Support and encouragement provided. No adverse drug noted.

## 2021-11-11 ENCOUNTER — Encounter: Payer: Self-pay | Admitting: Family Medicine

## 2021-11-11 DIAGNOSIS — F332 Major depressive disorder, recurrent severe without psychotic features: Principal | ICD-10-CM

## 2021-11-11 MED ORDER — SENNOSIDES-DOCUSATE SODIUM 8.6-50 MG PO TABS
2.0000 | ORAL_TABLET | Freq: Every day | ORAL | Status: DC
  Administered 2021-11-11 – 2021-11-19 (×9): 2 via ORAL
  Filled 2021-11-11 (×9): qty 2

## 2021-11-11 NOTE — Progress Notes (Signed)
Patient alert and oriented x3. Patient denies SI/HI/AVH. Patient denies depression but endorses her anxiety a 6/10. Patient states "I was bluffing about my suicide attempt during an argument with my daughter at the assisted living where I live." Patient complaint with medications, administered as ordered. Patient ate breakfast in the day room with other patients and staff displaying appropriate behavior. Patient states that "her goal is to work on feeling better. Patient verbally contracts for safety. Patient denies any pain or discomfort at this time. Q 15 minutes safety checks maintained. Patient is currently in her room resting. Will continue to monitor.

## 2021-11-11 NOTE — BH IP Treatment Plan (Signed)
Interdisciplinary Treatment and Diagnostic Plan Update  11/11/2021 Time of Session: 10:00AM NADIYA PIERATT MRN: [id number]  Principal Diagnosis: Major depressive disorder, recurrent severe without psychotic features [facility])  Secondary Diagnoses: Principal Problem:   Major depressive disorder, recurrent severe without psychotic features ([place])   Current Medications:  Current Facility-Administered Medications  Medication Dose Route Frequency Provider Last Rate Last Admin   acetaminophen (TYLENOL) tablet 500 mg  500 mg Oral Q6H PRN Patrecia Pour, NP       alum & mag hydroxide-simeth (MAALOX/MYLANTA) 200-200-20 MG/5ML suspension 30 mL  30 mL Oral Q4H PRN Patrecia Pour, NP       carbidopa-levodopa (SINEMET IR) 25-100 MG per tablet immediate release 1 tablet  1 tablet Oral TID Patrecia Pour, NP   1 tablet at 11/11/21 0800   clonazePAM (KLONOPIN) tablet 1 mg  1 mg Oral QHS Patrecia Pour, NP   1 mg at 11/10/21 2118   donepezil (ARICEPT) tablet 5 mg  5 mg Oral QHS Patrecia Pour, NP   5 mg at 11/10/21 2119   fluticasone (FLONASE) 50 MCG/ACT nasal spray 1 spray  1 spray Each Nare Daily Rulon Sera, MD   1 spray at 11/11/21 1019   magnesium hydroxide (MILK OF MAGNESIA) suspension 30 mL  30 mL Oral Daily PRN Patrecia Pour, NP       rOPINIRole (REQUIP) tablet 1 mg  1 mg Oral TID Patrecia Pour, NP   1 mg at 11/11/21 0800   traZODone (DESYREL) tablet 150 mg  150 mg Oral QHS Patrecia Pour, NP   150 mg at 11/10/21 2118   PTA Medications: Medications Prior to Admission  Medication Sig Dispense Refill Last Dose   atorvastatin (LIPITOR) 40 MG tablet Take 1 tablet by mouth once daily (Patient taking differently: Take 40 mg by mouth daily.) 90 tablet 3    carbidopa-levodopa (SINEMET IR) 25-100 MG tablet Take 1 tablet by mouth 3 (three) times daily.      clonazePAM (KLONOPIN) 0.5 MG tablet Take 2 tablets (1 mg total) by mouth at bedtime. 2 qam & 2 at dinner (Patient taking differently: Take 1 mg  by mouth at bedtime.)      donepezil (ARICEPT) 5 MG tablet Take 1 tablet (5 mg total) by mouth at bedtime.      Melatonin 10 MG TABS Take 5-10 mg by mouth at bedtime.      ondansetron (ZOFRAN) 4 MG tablet TAKE 1 TABLET BY MOUTH EVERY 8 HOURS AS NEEDED FOR NAUSEA OR VOMITING (Patient taking differently: Take 4 mg by mouth every 8 (eight) hours as needed.) 45 tablet 0    propranolol (INDERAL) 20 MG tablet Take 1 tablet (20 mg total) by mouth 2 (two) times daily. (Patient taking differently: Take 20 mg by mouth every evening.) 60 tablet 2    rOPINIRole (REQUIP) 1 MG tablet Take 1 mg by mouth 3 (three) times daily.      traZODone (DESYREL) 150 MG tablet Take 150 mg by mouth at bedtime.       Patient Stressors: Loss of independence    Patient Strengths: Supportive family/friends   Treatment Modalities: Medication Management, Group therapy, Case management,  1 to 1 session with clinician, Psychoeducation, Recreational therapy.   Physician Treatment Plan for Primary Diagnosis: Major depressive disorder, recurrent severe without psychotic features ([place]) Long Term Goal(s): Improvement in symptoms so as ready for discharge   Short Term Goals: Ability to demonstrate self-control will improve Ability to  identify changes in lifestyle to reduce recurrence of condition will improve  Medication Management: Evaluate patient's response, side effects, and tolerance of medication regimen.  Therapeutic Interventions: 1 to 1 sessions, Unit Group sessions and Medication administration.  Evaluation of Outcomes: Not Met  Physician Treatment Plan for Secondary Diagnosis: Principal Problem:   Major depressive disorder, recurrent severe without psychotic features ([place])  Long Term Goal(s): Improvement in symptoms so as ready for discharge   Short Term Goals: Ability to demonstrate self-control will improve Ability to identify changes in lifestyle to reduce recurrence of condition will improve     Medication  Management: Evaluate patient's response, side effects, and tolerance of medication regimen.  Therapeutic Interventions: 1 to 1 sessions, Unit Group sessions and Medication administration.  Evaluation of Outcomes: Not Met   RN Treatment Plan for Primary Diagnosis: Major depressive disorder, recurrent severe without psychotic features ([place]) Long Term Goal(s): Knowledge of disease and therapeutic regimen to maintain health will improve  Short Term Goals: Ability to remain free from injury will improve, Ability to demonstrate self-control, Ability to participate in decision making will improve, Ability to verbalize feelings will improve, Ability to disclose and discuss suicidal ideas, Ability to identify and develop effective coping behaviors will improve, and Compliance with prescribed medications will improve  Medication Management: RN will administer medications as ordered by provider, will assess and evaluate patient's response and provide education to patient for prescribed medication. RN will report any adverse and/or side effects to prescribing provider.  Therapeutic Interventions: 1 on 1 counseling sessions, Psychoeducation, Medication administration, Evaluate responses to treatment, Monitor vital signs and CBGs as ordered, Perform/monitor CIWA, COWS, AIMS and Fall Risk screenings as ordered, Perform wound care treatments as ordered.  Evaluation of Outcomes: Not Met   LCSW Treatment Plan for Primary Diagnosis: Major depressive disorder, recurrent severe without psychotic features ([place]) Long Term Goal(s): Safe transition to appropriate next level of care at discharge, Engage patient in therapeutic group addressing interpersonal concerns.  Short Term Goals: Engage patient in aftercare planning with referrals and resources, Increase social support, Increase ability to appropriately verbalize feelings, Increase emotional regulation, Facilitate acceptance of mental health diagnosis and concerns,  Identify triggers associated with mental health/substance abuse issues, and Increase skills for wellness and recovery  Therapeutic Interventions: Assess for all discharge needs, 1 to 1 time with Social worker, Explore available resources and support systems, Assess for adequacy in community support network, Educate family and significant other(s) on suicide prevention, Complete Psychosocial Assessment, Interpersonal group therapy.  Evaluation of Outcomes: Not Met   Progress in Treatment: Attending groups: No. Participating in groups: No. Taking medication as prescribed: Yes. Toleration medication: Yes. Family/Significant other contact made: Yes, individual(s) contacted:  pt's daughter Patient understands diagnosis: Yes. Discussing patient identified problems/goals with staff: Yes. Medical problems stabilized or resolved: Yes. Denies suicidal/homicidal ideation: Yes. Issues/concerns per patient self-inventory: No. Other: None  New problem(s) identified: No, Describe:  None  New Short Term/Long Term Goal(s): Patient to work towards medication management for mood stabilization; elimination of SI thoughts; development of comprehensive mental wellness plan.   Patient Goals:  "work on feeling better"  Discharge Plan or Barriers: CSW will assist pt with development of an appropriate discharge/aftercare plan.  Reason for Continuation of Hospitalization: Depression Medication stabilization  Estimated Length of Stay: TBD   Scribe for Treatment Team: Lumina Gitto A [place], Latanya Presser 11/11/2021 10:41 AM

## 2021-11-11 NOTE — Group Note (Signed)
LCSW Group Therapy Note  Group Date: 11/11/2021 Start Time: 1400 End Time: 1430   Type of Therapy and Topic:  Group Therapy - How To Cope with Nervousness about Discharge   Participation Level:  Did Not Attend   Description of Group This process group involved identification of patients' feelings about discharge. Some of them are scheduled to be discharged soon, while others are new admissions, but each of them was asked to share thoughts and feelings surrounding discharge from the hospital. One common theme was that they are excited at the prospect of going home, while another was that many of them are apprehensive about sharing why they were hospitalized. Patients were given the opportunity to discuss these feelings with their peers in preparation for discharge.  Therapeutic Goals  Patient will identify their overall feelings about pending discharge. Patient will think about how they might proactively address issues that they believe will once again arise once they get home (i.e. with parents). Patients will participate in discussion about having hope for change.   Summary of Patient Progress:   X   Therapeutic Modalities Cognitive Behavioral Therapy   [place], LCSWA 11/11/2021  3:12 PM

## 2021-11-11 NOTE — Progress Notes (Signed)
Recreation Therapy Notes  Date: 11/11/2021   Time: 1:15 pm     Location: Court yard    Behavioral response: Appropriate   Intervention Topic: Leisure    Discussion/Intervention:  Group content today was focused on leisure. The group defined what leisure is and some positive leisure activities they participate in. Individuals identified the difference between good and bad leisure. Participants expressed how they feel after participating in the leisure of their choice. The group discussed how they go about picking a leisure activity and if others are involved in their leisure activities. The patient stated how many leisure activities they have to choose from and reasons why it is important to have leisure time. Individuals participated in the intervention Exploration of Leisure where they had a chance to identify new leisure activities as well as benefits of leisure.   Clinical Observations/Feedback: Patient came to group and was active, focused and engaged in group. Individual was social with peers and staff while participating in the intervention. Participant left group early and did not return.    Melesa Lecy LRT/CTRS          Maizy Davanzo 11/11/2021 3:35 PM

## 2021-11-11 NOTE — Progress Notes (Signed)
[facility] MD Progress Note  11/11/2021 10:36 AM Shannon Sutton  MRN:  [id number] Subjective: I met with Shannon Sutton today in treatment team.  She downplays any suicide attempt.  She lives at [facility] assisted [facility] in [place].  She has a history of Parkinson's.  She states that she got in a fight with her daughter the day that they said that she tried to commit suicide.  She states that she fell and denies any suicidal ideation.  She denies depression.  She does not have any significant psychiatric history that she can tell me.  She states that she does not have any problem with where she lives.  She states that she just wants to feel better.  Principal Problem: Major depressive disorder, recurrent severe without psychotic features ([place]) Diagnosis: Principal Problem:   Major depressive disorder, recurrent severe without psychotic features ([place])  Total Time spent with patient: 15 minutes  Past Psychiatric History: ?  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)    Restless leg syndrome 12/15/2013   Unspecified sleep apnea    Urine incontinence     Past Surgical History:  Procedure Laterality Date    ABDOMINAL HYSTERECTOMY     ABDOMINOPLASTY  2006   "tummy tuck"   BREAST REDUCTION SURGERY  1995   BREAST SURGERY     CATARACT EXTRACTION, BILATERAL     COLONOSCOPY  2018   MUSCLE BIOPSY  2009   DEEP TISSUE   REFRACTIVE SURGERY     TONSILLECTOMY     TOTAL ABDOMINAL HYSTERECTOMY  1999   WISDOM TOOTH EXTRACTION     Family History:  Family History  Problem Relation Age of Onset   Colon polyps Mother    Heart disease Mother    Heart attack Mother    Heart attack Father    Colon cancer Father 29   Heart disease Father    COPD Sister    Emphysema Sister    COPD Brother    Emphysema Brother    Lung cancer Brother    Breast cancer Paternal Grandmother    Heart disease Paternal Grandfather    Colon polyps Paternal Grandfather    Heart disease Other        family history   Parkinson's disease Other    Stomach cancer Neg Hx    Rectal cancer Neg Hx    Esophageal cancer Neg Hx     Social History:  Social History   Substance and Sexual Activity  Alcohol Use Not Currently     Social  History   Substance and Sexual Activity  Drug Use No    Social History   Socioeconomic History   Marital status: Divorced    Spouse name: Not on file   Number of children: 2   Years of education: Not on file   Highest education level: Some college, no degree  Occupational History   Occupation: Retired    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  Tobacco Use   Smoking status: Former    Types: Cigarettes    Quit date: 12/19/1986    Years since quitting: 34.9   Smokeless tobacco: Never  Vaping Use   Vaping Use: Never used  Substance and Sexual Activity   Alcohol use: Not Currently   Drug use: No   Sexual activity: Yes    Birth control/protection: Surgical, Post-menopausal  Other Topics Concern   Not on file  Social History Narrative   Diet:      Caffeine: occasionally iced coffee      Married, if yes what year: Divorced      Do you live in a house, apartment,  assisted living, condo, trailer, ect: House      Is it one or more stories: 1      How many persons live in your home? 1      Pets: Dog      Highest level or education completed: Some college      Current/Past profession: Patent examiner      Exercise:  No                Type and how often:          Living Will: Yes   DNR: No and would like to discuss   POA/HPOA: Yes      Functional Status:   Do you have difficulty bathing or dressing yourself? No   Do you have difficulty preparing food or eating? Yes, occasionally   Do you have difficulty managing your medications? Yes   Do you have difficulty managing your finances? Yes ( forget password and double pays   Do you have difficulty affording your medications? No   Social Determinants of Radio broadcast assistant Strain: Low Risk    Difficulty of Paying Living Expenses: Not hard at all  Food Insecurity: No Food Insecurity   Worried About Charity fundraiser in the Last Year: Never true   Arboriculturist in the Last Year: Never true  Transportation Needs: Unknown   Lack of Transportation (Medical): No   Lack of Transportation (Non-Medical): Patient refused  Physical Activity: Unknown   Days of Exercise per Week: 0 days   Minutes of Exercise per Session: Not on file  Stress: Stress Concern Present   Feeling of Stress : Very much  Social Connections: Socially Isolated   Frequency of Communication with Friends and Family: More than three times a week   Frequency of Social Gatherings with Friends and Family: Three times a week   Attends Religious Services: Never   Active Member of Clubs or Organizations: No   Attends Music therapist: Not on file   Marital Status: Divorced   Additional Social History:                         Sleep: Good  Appetite:  Good  Current Medications: Current Facility-Administered Medications  Medication Dose Route Frequency Provider Last Rate Last Admin  acetaminophen (TYLENOL) tablet 500 mg  500 mg Oral Q6H PRN Patrecia Pour, NP       alum & mag hydroxide-simeth (MAALOX/MYLANTA) 200-200-20 MG/5ML suspension 30 mL  30 mL Oral Q4H PRN Patrecia Pour, NP       carbidopa-levodopa (SINEMET IR) 25-100 MG per tablet immediate release 1 tablet  1 tablet Oral TID Patrecia Pour, NP   1 tablet at 11/11/21 0800   clonazePAM (KLONOPIN) tablet 1 mg  1 mg Oral QHS Patrecia Pour, NP   1 mg at 11/10/21 2118   donepezil (ARICEPT) tablet 5 mg  5 mg Oral QHS Patrecia Pour, NP   5 mg at 11/10/21 2119   fluticasone (FLONASE) 50 MCG/ACT nasal spray 1 spray  1 spray Each Nare Daily Rulon Sera, MD   1 spray at 11/11/21 1019   magnesium hydroxide (MILK OF MAGNESIA) suspension 30 mL  30 mL Oral Daily PRN Patrecia Pour, NP       rOPINIRole (REQUIP) tablet 1 mg  1 mg Oral TID Patrecia Pour, NP   1 mg at 11/11/21 0800   traZODone (DESYREL) tablet 150 mg  150 mg Oral QHS Patrecia Pour, NP   150 mg at 11/10/21 2118    Lab Results:  Results for orders placed or performed during the hospital encounter of 11/07/21 (from the past 48 hour(s))  Resp Panel by RT-PCR (Flu A&B, Covid) Nasopharyngeal Swab     Status: None   Collection Time: 11/09/21 12:30 PM   Specimen: Nasopharyngeal Swab; Nasopharyngeal(NP) swabs in vial transport medium  Result Value Ref Range   SARS Coronavirus 2 by RT PCR NEGATIVE NEGATIVE    Comment: (NOTE) SARS-CoV-2 target nucleic acids are NOT DETECTED.  The SARS-CoV-2 RNA is generally detectable in upper respiratory specimens during the acute phase of infection. The lowest concentration of SARS-CoV-2 viral copies this assay can detect is 138 copies/mL. A negative result does not preclude SARS-Cov-2 infection and should not be used as the sole basis for treatment or other patient management decisions. A negative result may occur with  improper specimen collection/handling, submission of specimen other than nasopharyngeal swab, presence  of viral mutation(s) within the areas targeted by this assay, and inadequate number of viral copies(<138 copies/mL). A negative result must be combined with clinical observations, patient history, and epidemiological information. The expected result is Negative.  Fact Sheet for Patients:  EntrepreneurPulse.com.au  Fact Sheet for Healthcare Providers:  IncredibleEmployment.be  This test is no t yet approved or cleared by the [place] FDA and  has been authorized for detection and/or diagnosis of SARS-CoV-2 by FDA under an Emergency Use Authorization (EUA). This EUA will remain  in effect (meaning this test can be used) for the duration of the COVID-19 declaration under Section 564(b)(1) of the Act, 21 U.S.C.section 360bbb-3(b)(1), unless the authorization is terminated  or revoked sooner.       Influenza A by PCR NEGATIVE NEGATIVE   Influenza B by PCR NEGATIVE NEGATIVE    Comment: (NOTE) The Xpert Xpress SARS-CoV-2/FLU/RSV plus assay is intended as an aid in the diagnosis of influenza from Nasopharyngeal swab specimens and should not be used as a sole basis for treatment. Nasal washings and aspirates are unacceptable for Xpert Xpress SARS-CoV-2/FLU/RSV testing.  Fact Sheet for Patients: EntrepreneurPulse.com.au  Fact Sheet for Healthcare Providers: IncredibleEmployment.be  This test is not yet approved or cleared by the [place] FDA and has been authorized for detection and/or diagnosis of  SARS-CoV-2 by FDA under an Emergency Use Authorization (EUA). This EUA will remain in effect (meaning this test can be used) for the duration of the COVID-19 declaration under Section 564(b)(1) of the Act, 21 U.S.C. section 360bbb-3(b)(1), unless the authorization is terminated or revoked.  Performed at [facility], [place] [address], [place], [place] [address]     Blood Alcohol level:   Lab Results  Component Value Date   [facility] <10 11/08/2021   ETH <10 70/96/2836    Metabolic Disorder Labs: Lab Results  Component Value Date   HGBA1C 4.7 (L) 11/08/2021   MPG 88.19 11/08/2021   No results found for: PROLACTIN Lab Results  Component Value Date   CHOL 153 11/08/2021   TRIG 62 11/08/2021   HDL 54 11/08/2021   CHOLHDL 2.8 11/08/2021   VLDL 12 11/08/2021   LDLCALC 87 11/08/2021   LDLCALC 69 06/20/2020    Physical Findings: AIMS:  , ,  ,  ,    CIWA:    COWS:     Musculoskeletal: Strength & Muscle Tone: within normal limits Gait & Station: normal Patient leans: N/A  Psychiatric Specialty Exam:  Presentation  General Appearance: Appropriate for Environment; Casual  Eye Contact:Fair  Speech:Clear and Coherent; Normal Rate  Speech Volume:Normal  Handedness:No data recorded  Mood and Affect  Mood:Depressed; Anxious  Affect:Congruent   Thought Process  Thought Processes:Coherent  Descriptions of Associations:Intact  Orientation:Partial  Thought Content:Logical  History of Schizophrenia/Schizoaffective disorder:No  Duration of Psychotic Symptoms:No data recorded Hallucinations:No data recorded Ideas of Reference:None  Suicidal Thoughts:No data recorded Homicidal Thoughts:No data recorded  Sensorium  Memory:Immediate Fair; Recent Fair; Remote Fair  Judgment:Impaired  Insight:Present   Executive Functions  Concentration:Fair  Attention Span:Fair  [place]  Language:Good   Psychomotor Activity  Psychomotor Activity:No data recorded  Assets  Assets:Desire for Improvement; Financial Resources/Insurance; Housing; Social Support   Sleep  Sleep:No data recorded   Physical Exam: Physical Exam Vitals and nursing note reviewed.  Constitutional:      Appearance: Normal appearance. She is normal weight.  Neurological:     General: No focal deficit present.     Mental Status: She is alert and  oriented to person, place, and time.  Psychiatric:        Attention and Perception: Attention and perception normal.        Mood and Affect: Mood and affect normal.        Speech: Speech normal.        Behavior: Behavior normal. Behavior is cooperative.        Thought Content: Thought content normal.        Cognition and Memory: Cognition and memory normal.        Judgment: Judgment is impulsive.   Review of Systems  Constitutional: Negative.   HENT: Negative.    Eyes: Negative.   Respiratory: Negative.    Cardiovascular: Negative.   Gastrointestinal: Negative.   Genitourinary: Negative.   Musculoskeletal: Negative.   Skin: Negative.   Neurological: Negative.   Endo/Heme/Allergies: Negative.   Psychiatric/Behavioral: Negative.    Blood pressure (!) 101/58, pulse (!) 105, temperature 98.3 F (36.8 C), temperature source Oral, resp. rate 18, height $RemoveBe'5\' 1"'sOCYhBYgE$  (1.549 m), weight 48.5 kg, SpO2 100 %. Body mass index is 20.22 kg/m.   Treatment Plan Summary: Daily contact with patient to assess and evaluate symptoms and progress in treatment, Medication management, and Plan Continue current medications  Par[place] Ranger, DO 11/11/2021, 10:36  AM

## 2021-11-12 DIAGNOSIS — F332 Major depressive disorder, recurrent severe without psychotic features: Secondary | ICD-10-CM | POA: Diagnosis not present

## 2021-11-12 MED ORDER — TRAZODONE HCL 50 MG PO TABS
150.0000 mg | ORAL_TABLET | Freq: Every evening | ORAL | Status: DC | PRN
  Administered 2021-11-12 – 2021-11-17 (×4): 150 mg via ORAL
  Filled 2021-11-12 (×4): qty 3

## 2021-11-12 MED ORDER — MIRTAZAPINE 15 MG PO TABS
15.0000 mg | ORAL_TABLET | Freq: Every day | ORAL | Status: DC
  Administered 2021-11-12 – 2021-11-18 (×7): 15 mg via ORAL
  Filled 2021-11-12 (×7): qty 1

## 2021-11-12 NOTE — Progress Notes (Signed)
Recreation Therapy Notes  INPATIENT RECREATION THERAPY ASSESSMENT  Patient Details Name: Shannon Sutton MRN: [id number] DOB: Aug 31, 1946 Today's Date: 11/12/2021       Information Obtained From: Chart Review  Able to Participate in Assessment/Interview:    Patient Presentation: Confused  Reason for Admission (Per Patient): Active Symptoms, Suicidal Ideation  Patient Stressors: Family  Coping Skills:   Talk, Prayer  Leisure Interests (2+):   (None)  Frequency of Recreation/Participation:    Awareness of Community Resources:     [employer]:     Current Use:    If no, Barriers?:    Expressed Interest in [employer] Information:    [place] of Residence:  [place]  Patient Main Form of Transportation: Other (Comment) (Daughter)  Patient Strengths:  Still being alive  Patient Identified Areas of Improvement:  Telling the truth  Patient Goal for Hospitalization:  To get out  Current SI (including self-harm):  No  Current HI:  No  Current AVH: No  Staff Intervention Plan: Group Attendance, Collaborate with Interdisciplinary Treatment Team  Consent to Intern Participation: N/A  [name] 11/12/2021, 12:29 PM

## 2021-11-12 NOTE — Progress Notes (Signed)
Recreation Therapy Notes  Date: 11/12/2021  Time: 10:50 am    Location: Craft room    Behavioral response: N/A   Intervention Topic: Coping skills   Discussion/Intervention: Patient refused to attend group.   Clinical Observations/Feedback:  Patient refused to attend group.   Adelynn Gipe LRT/CTRS         Glena Pharris 11/12/2021 12:05 PM

## 2021-11-12 NOTE — Progress Notes (Signed)
Patient constantly asking for her "Parkinson medications" several times, Patient endorses anxiety and depression. Denies SI/HI/A/VH and verbally contracted for safety.  Patient ambulating with steady gait not using walker. Support and encouragement provided.

## 2021-11-12 NOTE — Progress Notes (Signed)
[facility] MD Progress Note  11/12/2021 11:14 AM Shannon Sutton  MRN:  [id number] Subjective:  Shannon Sutton has been wandering into other people's rooms.  She has been taking her medications as prescribed and denies any side effects.  She states that her mood is depressed and anxious. She is not sure whether she slept very well last night but the notes say that she did.  She seems to be confused off and on.  She denies any suicidal ideation.  Principal Problem: Major depressive disorder, recurrent severe without psychotic features ([place]) Diagnosis: Principal Problem:   Major depressive disorder, recurrent severe without psychotic features ([place])  Total Time spent with patient: 15 minutes  Past Psychiatric History: Dr. 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   Recurrent  Dr Shannon Sutton 2009 had a bx/MRI: was on MTX and Prednisone   External hemorrhoids    Fibromyalgia with myofascial pain 11/16/2012   Headaches, cluster    History of CT scan of head 2022   History of EKG 2021   History of mammogram 2022   History of MRI 2022   Brain   Hypercholesterolemia 12/15/2013   Hypertension    Insomnia 11/16/2012   Myalgia and myositis, unspecified    Other disorder of muscle, ligament, and fascia    eosinophilic fascitis   Other specified disease of hair and hair follicles    Pain in joint, multiple sites    Parkinson's disease ([place])    Restless leg syndrome 12/15/2013   Unspecified sleep apnea    Urine incontinence     Past Surgical History:  Procedure Laterality Date   ABDOMINAL HYSTERECTOMY     ABDOMINOPLASTY  2006   "tummy tuck"   BREAST REDUCTION SURGERY  1995   BREAST SURGERY     CATARACT EXTRACTION,  BILATERAL     COLONOSCOPY  2018   MUSCLE BIOPSY  2009   DEEP TISSUE   REFRACTIVE SURGERY     TONSILLECTOMY     TOTAL ABDOMINAL HYSTERECTOMY  1999   WISDOM TOOTH EXTRACTION     Family History:  Family History  Problem Relation Age of Onset   Colon polyps Mother    Heart disease Mother    Heart attack Mother    Heart attack Father    Colon cancer Father 42   Heart disease Father    COPD Sister    Emphysema Sister    COPD Brother    Emphysema Brother    Lung cancer Brother    Breast cancer Paternal Grandmother    Heart disease Paternal Grandfather    Colon polyps Paternal Grandfather    Heart disease Other        family history   Parkinson's disease Other    Stomach cancer Neg Hx    Rectal cancer Neg Hx    Esophageal cancer Neg Hx     Social History:  Social History   Substance and Sexual Activity  Alcohol Use Not Currently     Social History   Substance and Sexual Activity  Drug Use No    Social History   Socioeconomic History   Marital status: Divorced    Spouse name: Not on file  Number of children: 2   Years of education: Not on file   Highest education level: Some college, no degree  Occupational History   Occupation: Retired    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  Tobacco Use   Smoking status: Former    Types: Cigarettes    Quit date: 12/19/1986    Years since quitting: 34.9   Smokeless tobacco: Never  Vaping Use   Vaping Use: Never used  Substance and Sexual Activity   Alcohol use: Not Currently   Drug use: No   Sexual activity: Yes    Birth control/protection: Surgical, Post-menopausal  Other Topics Concern   Not on file  Social History Narrative   Diet:      Caffeine: occasionally iced coffee      Married, if yes what year: Divorced      Do you live in a house, apartment, assisted living, condo, trailer, ect: House      Is it one or more stories: 1      How many persons live in your home? 1      Pets: Dog       Highest level or education completed: Some college      Current/Past profession: Patent examiner      Exercise:  No                Type and how often:          Living Will: Yes   DNR: No and would like to discuss   POA/HPOA: Yes      Functional Status:   Do you have difficulty bathing or dressing yourself? No   Do you have difficulty preparing food or eating? Yes, occasionally   Do you have difficulty managing your medications? Yes   Do you have difficulty managing your finances? Yes ( forget password and double pays   Do you have difficulty affording your medications? No   Social Determinants of Radio broadcast assistant Strain: Low Risk    Difficulty of Paying Living Expenses: Not hard at all  Food Insecurity: No Food Insecurity   Worried About Charity fundraiser in the Last Year: Never true   Arboriculturist in the Last Year: Never true  Transportation Needs: Unknown   Lack of Transportation (Medical): No   Lack of Transportation (Non-Medical): Patient refused  Physical Activity: Unknown   Days of Exercise per Week: 0 days   Minutes of Exercise per Session: Not on file  Stress: Stress Concern Present   Feeling of Stress : Very much  Social Connections: Socially Isolated   Frequency of Communication with Friends and Family: More than three times a week   Frequency of Social Gatherings with Friends and Family: Three times a week   Attends Religious Services: Never   Active Member of Clubs or Organizations: No   Attends Music therapist: Not on file   Marital Status: Divorced   Additional Social History:                         Sleep: Good  Appetite:  Good  Current Medications: Current Facility-Administered Medications  Medication Dose Route Frequency Provider Last Rate Last Admin   acetaminophen (TYLENOL) tablet 500 mg  500 mg Oral Q6H PRN Patrecia Pour, NP       alum & mag hydroxide-simeth (MAALOX/MYLANTA) 200-200-20 MG/5ML  suspension 30 mL  30 mL Oral  Q4H PRN Patrecia Pour, NP       carbidopa-levodopa (SINEMET IR) 25-100 MG per tablet immediate release 1 tablet  1 tablet Oral TID Patrecia Pour, NP   1 tablet at 11/12/21 9417   clonazePAM (KLONOPIN) tablet 1 mg  1 mg Oral QHS Patrecia Pour, NP   1 mg at 11/11/21 2054   donepezil (ARICEPT) tablet 5 mg  5 mg Oral QHS Patrecia Pour, NP   5 mg at 11/11/21 2055   fluticasone (FLONASE) 50 MCG/ACT nasal spray 1 spray  1 spray Each Nare Daily Rulon Sera, MD   1 spray at 11/12/21 0920   magnesium hydroxide (MILK OF MAGNESIA) suspension 30 mL  30 mL Oral Daily PRN Patrecia Pour, NP   30 mL at 11/11/21 2059   rOPINIRole (REQUIP) tablet 1 mg  1 mg Oral TID Patrecia Pour, NP   1 mg at 11/12/21 4081   senna-docusate (Senokot-S) tablet 2 tablet  2 tablet Oral QPC breakfast Parks Ranger, DO   2 tablet at 11/12/21 0919   traZODone (DESYREL) tablet 150 mg  150 mg Oral QHS Patrecia Pour, NP   150 mg at 11/11/21 2054    Lab Results: No results found for this or any previous visit (from the past 48 hour(s)).  Blood Alcohol level:  Lab Results  Component Value Date   ETH <10 11/08/2021   ETH <10 44/81/8563    Metabolic Disorder Labs: Lab Results  Component Value Date   HGBA1C 4.7 (L) 11/08/2021   MPG 88.19 11/08/2021   No results found for: PROLACTIN Lab Results  Component Value Date   CHOL 153 11/08/2021   TRIG 62 11/08/2021   HDL 54 11/08/2021   CHOLHDL 2.8 11/08/2021   VLDL 12 11/08/2021   LDLCALC 87 11/08/2021   LDLCALC 69 06/20/2020    Physical Findings: AIMS:  , ,  ,  ,    CIWA:    COWS:     Musculoskeletal: Strength & Muscle Tone: within normal limits Gait & Station: normal Patient leans: N/A  Psychiatric Specialty Exam:  Presentation  General Appearance: Appropriate for Environment; Casual  Eye Contact:Fair  Speech:Clear and Coherent; Normal Rate  Speech Volume:Normal  Handedness:No data recorded  Mood and Affect   Mood:Depressed; Anxious  Affect:Congruent   Thought Process  Thought Processes:Coherent  Descriptions of Associations:Intact  Orientation:Partial  Thought Content:Logical  History of Schizophrenia/Schizoaffective disorder:No  Duration of Psychotic Symptoms:No data recorded Hallucinations:No data recorded Ideas of Reference:None  Suicidal Thoughts:No data recorded Homicidal Thoughts:No data recorded  Sensorium  Memory:Immediate Fair; Recent Fair; Remote Fair  Judgment:Impaired  Insight:Present   Executive Functions  Concentration:Fair  Attention Span:Fair  [place]  Language:Good   Psychomotor Activity  Psychomotor Activity:No data recorded  Assets  Assets:Desire for Improvement; Financial Resources/Insurance; Housing; Social Support   Sleep  Sleep:No data recorded   Physical Exam: Physical Exam Vitals and nursing note reviewed.  Constitutional:      Appearance: Normal appearance. She is normal weight.  Neurological:     General: No focal deficit present.     Mental Status: She is alert and oriented to person, place, and time.  Psychiatric:        Attention and Perception: Attention and perception normal.        Mood and Affect: Mood is anxious and depressed.        Speech: Speech normal.        Behavior: Behavior  normal. Behavior is cooperative.        Thought Content: Thought content is paranoid.        Cognition and Memory: Cognition is impaired.        Judgment: Judgment is impulsive.   Review of Systems  Constitutional: Negative.   HENT: Negative.    Eyes: Negative.   Respiratory: Negative.    Cardiovascular: Negative.   Gastrointestinal: Negative.   Genitourinary: Negative.   Musculoskeletal: Negative.   Skin: Negative.   Neurological: Negative.   Endo/Heme/Allergies: Negative.   Psychiatric/Behavioral:  Positive for depression and memory loss. The patient is nervous/anxious.   Blood pressure 129/64,  pulse 88, temperature (!) 97.4 F (36.3 C), temperature source Oral, resp. rate 18, height 5\' 1"  (1.549 m), weight 48.5 kg, SpO2 100 %. Body mass index is 20.22 kg/m.   Treatment Plan Summary: Daily contact with patient to assess and evaluate symptoms and progress in treatment, Medication management, and Plan discussed antidepressant therapy.  See orders.  Parks Ranger, DO 11/12/2021, 11:14 AM

## 2021-11-12 NOTE — Progress Notes (Signed)
Patient denies SI, HI, and AVH. She says, "I just need to rest." Patient reports poor sleep, although staff reported that patient appeared to be asleep in her room all night. Patient reported constipation and says she has never had regular bowel movements. Patient cannot remember when her last bowel movement was, but says it was "more than a few days ago." Patient came out for breakfast and returned to her room. Patient is compliant with scheduled medications. Patient remains safe on the unit at this time.

## 2021-11-12 NOTE — Progress Notes (Signed)
Recreation Therapy Notes  INPATIENT RECREATION TR PLAN  Patient Details Name: Shannon Sutton MRN: [id number] DOB: 1946/01/28 Today's Date: 11/12/2021  Rec Therapy Plan Is patient appropriate for Therapeutic Recreation?: Yes Treatment times per week: at least 3 Estimated Length of Stay: 5-7 days TR Treatment/Interventions: Group participation (Comment)  Discharge Criteria Pt will be discharged from therapy if:: Discharged Treatment plan/goals/alternatives discussed and agreed upon by:: Patient/family  Discharge Summary     Fiorella Hanahan 11/12/2021, 12:30 PM

## 2021-11-13 DIAGNOSIS — F332 Major depressive disorder, recurrent severe without psychotic features: Secondary | ICD-10-CM | POA: Diagnosis not present

## 2021-11-13 NOTE — Group Note (Signed)
[facility] LCSW Group Therapy Note   Group Date: 11/13/2021 Start Time: 1400 End Time: 1445   Type of Therapy/Topic:  Group Therapy:  Emotion Regulation  Participation Level:  Active     Description of Group:    The purpose of this group is to assist patients in learning to regulate negative emotions and experience positive emotions. Patients will be guided to discuss ways in which they have been vulnerable to their negative emotions. These vulnerabilities will be juxtaposed with experiences of positive emotions or situations, and patients challenged to use positive emotions to combat negative ones. Special emphasis will be placed on coping with negative emotions in conflict situations, and patients will process healthy conflict resolution skills.  Therapeutic Goals: Patient will identify two positive emotions or experiences to reflect on in order to balance out negative emotions:  Patient will label two or more emotions that they find the most difficult to experience:  Patient will be able to demonstrate positive conflict resolution skills through discussion or role plays:   Summary of Patient Progress:   Patient was present for the entirety of the group session. Patient was an active listener and participated in the topic of discussion, provided helpful advice to others, and added nuance to topic of conversation. CSW facilitated mindfulness activity in practice of learning regulation skills as well as practice identifying emotions. She stated that she desires to not "feel so depressed" and improve negative thoughts,was of normal mood through sesion. She stated she found activity helpful and that it reminded her of her regular practice of praying. She stated her main desire was to go home and for things to be back to how they were before her incident.      Therapeutic Modalities:   Cognitive Behavioral Therapy Feelings Identification Dialectical Behavioral Therapy   [place] Kirchner A [place], LCSWA

## 2021-11-13 NOTE — Progress Notes (Signed)
[facility] MD Progress Note  11/13/2021 11:58 AM Shannon Sutton  MRN:  [id number] Subjective: Shannon Sutton is seen today.  She states that she did not sleep very well.  We started her on Remeron last night and she did ask for trazodone also.  Her affect is a little odd she seems to be somewhat histrionic.  She is worried that she will not be accepted back to her nursing facility.  She is taking her medications as prescribed and denies any side effects.  She still complains of depression and anxiety.  I told her that she needs to give the medication more time.  She is mostly isolative to her room.  Principal Problem: Major depressive disorder, recurrent severe without psychotic features ([place]) Diagnosis: Principal Problem:   Major depressive disorder, recurrent severe without psychotic features ([place])  Total Time spent with patient: 15 minutes  Past Psychiatric History: Dr. Casimiro Needle in the past.  Past Medical History:  Past Medical History:  Diagnosis Date   Adenomatous colon polyp 1990   Allergy    Anxiety 12/15/2013   Arthritis    Cataract    Chronic constipation    Depression 11/16/2012   Diverticulitis    Diverticulosis    Eosinophilic fasciitis 26/37/8588   Recurrent  Dr Trudie Reed 2009 had a bx/MRI: was on MTX and Prednisone   External hemorrhoids    Fibromyalgia with myofascial pain 11/16/2012   Headaches, cluster    History of CT scan of head 2022   History of EKG 2021   History of mammogram 2022   History of MRI 2022   Brain   Hypercholesterolemia 12/15/2013   Hypertension    Insomnia 11/16/2012   Myalgia and myositis, unspecified    Other disorder of muscle, ligament, and fascia    eosinophilic fascitis   Other specified disease of hair and hair follicles    Pain in joint, multiple sites    Parkinson's disease ([place])    Restless leg syndrome 12/15/2013   Unspecified sleep apnea    Urine incontinence     Past Surgical History:  Procedure Laterality Date   ABDOMINAL HYSTERECTOMY      ABDOMINOPLASTY  2006   "tummy tuck"   BREAST REDUCTION SURGERY  1995   BREAST SURGERY     CATARACT EXTRACTION, BILATERAL     COLONOSCOPY  2018   MUSCLE BIOPSY  2009   DEEP TISSUE   REFRACTIVE SURGERY     TONSILLECTOMY     TOTAL ABDOMINAL HYSTERECTOMY  1999   WISDOM TOOTH EXTRACTION     Family History:  Family History  Problem Relation Age of Onset   Colon polyps Mother    Heart disease Mother    Heart attack Mother    Heart attack Father    Colon cancer Father 80   Heart disease Father    COPD Sister    Emphysema Sister    COPD Brother    Emphysema Brother    Lung cancer Brother    Breast cancer Paternal Grandmother    Heart disease Paternal Grandfather    Colon polyps Paternal Grandfather    Heart disease Other        family history   Parkinson's disease Other    Stomach cancer Neg Hx    Rectal cancer Neg Hx    Esophageal cancer Neg Hx     Social History:  Social History   Substance and Sexual Activity  Alcohol Use Not Currently     Social History  Substance and Sexual Activity  Drug Use No    Social History   Socioeconomic History   Marital status: Divorced    Spouse name: Not on file   Number of children: 2   Years of education: Not on file   Highest education level: Some college, no degree  Occupational History   Occupation: Retired    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  Tobacco Use   Smoking status: Former    Types: Cigarettes    Quit date: 12/19/1986    Years since quitting: 34.9   Smokeless tobacco: Never  Vaping Use   Vaping Use: Never used  Substance and Sexual Activity   Alcohol use: Not Currently   Drug use: No   Sexual activity: Yes    Birth control/protection: Surgical, Post-menopausal  Other Topics Concern   Not on file  Social History Narrative   Diet:      Caffeine: occasionally iced coffee      Married, if yes what year: Divorced      Do you live in a house, apartment, assisted living, condo,  trailer, ect: House      Is it one or more stories: 1      How many persons live in your home? 1      Pets: Dog      Highest level or education completed: Some college      Current/Past profession: Patent examiner      Exercise:  No                Type and how often:          Living Will: Yes   DNR: No and would like to discuss   POA/HPOA: Yes      Functional Status:   Do you have difficulty bathing or dressing yourself? No   Do you have difficulty preparing food or eating? Yes, occasionally   Do you have difficulty managing your medications? Yes   Do you have difficulty managing your finances? Yes ( forget password and double pays   Do you have difficulty affording your medications? No   Social Determinants of Radio broadcast assistant Strain: Low Risk    Difficulty of Paying Living Expenses: Not hard at all  Food Insecurity: No Food Insecurity   Worried About Charity fundraiser in the Last Year: Never true   Arboriculturist in the Last Year: Never true  Transportation Needs: Unknown   Lack of Transportation (Medical): No   Lack of Transportation (Non-Medical): Patient refused  Physical Activity: Unknown   Days of Exercise per Week: 0 days   Minutes of Exercise per Session: Not on file  Stress: Stress Concern Present   Feeling of Stress : Very much  Social Connections: Socially Isolated   Frequency of Communication with Friends and Family: More than three times a week   Frequency of Social Gatherings with Friends and Family: Three times a week   Attends Religious Services: Never   Active Member of Clubs or Organizations: No   Attends Music therapist: Not on file   Marital Status: Divorced   Additional Social History:                         Sleep: Poor  Appetite:  Fair  Current Medications: Current Facility-Administered Medications  Medication Dose Route Frequency Provider Last Rate Last Admin   acetaminophen (TYLENOL)  tablet  500 mg  500 mg Oral Q6H PRN Patrecia Pour, NP       alum & mag hydroxide-simeth (MAALOX/MYLANTA) 200-200-20 MG/5ML suspension 30 mL  30 mL Oral Q4H PRN Patrecia Pour, NP   30 mL at 11/13/21 0834   carbidopa-levodopa (SINEMET IR) 25-100 MG per tablet immediate release 1 tablet  1 tablet Oral TID Patrecia Pour, NP   1 tablet at 11/13/21 6948   clonazePAM (KLONOPIN) tablet 1 mg  1 mg Oral QHS Patrecia Pour, NP   1 mg at 11/12/21 2051   donepezil (ARICEPT) tablet 5 mg  5 mg Oral QHS Patrecia Pour, NP   5 mg at 11/12/21 2051   fluticasone (FLONASE) 50 MCG/ACT nasal spray 1 spray  1 spray Each Nare Daily Rulon Sera, MD   1 spray at 11/13/21 1120   magnesium hydroxide (MILK OF MAGNESIA) suspension 30 mL  30 mL Oral Daily PRN Patrecia Pour, NP   30 mL at 11/11/21 2059   mirtazapine (REMERON) tablet 15 mg  15 mg Oral QHS Parks Ranger, DO   15 mg at 11/12/21 2051   rOPINIRole (REQUIP) tablet 1 mg  1 mg Oral TID Patrecia Pour, NP   1 mg at 11/13/21 5462   senna-docusate (Senokot-S) tablet 2 tablet  2 tablet Oral QPC breakfast Parks Ranger, DO   2 tablet at 11/13/21 7035   traZODone (DESYREL) tablet 150 mg  150 mg Oral QHS PRN Parks Ranger, DO   150 mg at 11/12/21 2051    Lab Results: No results found for this or any previous visit (from the past 48 hour(s)).  Blood Alcohol level:  Lab Results  Component Value Date   ETH <10 11/08/2021   ETH <10 00/93/8182    Metabolic Disorder Labs: Lab Results  Component Value Date   HGBA1C 4.7 (L) 11/08/2021   MPG 88.19 11/08/2021   No results found for: PROLACTIN Lab Results  Component Value Date   CHOL 153 11/08/2021   TRIG 62 11/08/2021   HDL 54 11/08/2021   CHOLHDL 2.8 11/08/2021   VLDL 12 11/08/2021   LDLCALC 87 11/08/2021   LDLCALC 69 06/20/2020    Physical Findings: AIMS:  , ,  ,  ,    CIWA:    COWS:     Musculoskeletal: Strength & Muscle Tone: within normal limits Gait & Station:  normal Patient leans: N/A  Psychiatric Specialty Exam:  Presentation  General Appearance: Appropriate for Environment; Casual  Eye Contact:Fair  Speech:Clear and Coherent; Normal Rate  Speech Volume:Normal  Handedness:No data recorded  Mood and Affect  Mood:Depressed; Anxious  Affect:Congruent   Thought Process  Thought Processes:Coherent  Descriptions of Associations:Intact  Orientation:Partial  Thought Content:Logical  History of Schizophrenia/Schizoaffective disorder:No  Duration of Psychotic Symptoms:No data recorded Hallucinations:No data recorded Ideas of Reference:None  Suicidal Thoughts:No data recorded Homicidal Thoughts:No data recorded  Sensorium  Memory:Immediate Fair; Recent Fair; Remote Fair  Judgment:Impaired  Insight:Present   Executive Functions  Concentration:Fair  Attention Span:Fair  [place]  Language:Good   Psychomotor Activity  Psychomotor Activity:No data recorded  Assets  Assets:Desire for Improvement; Financial Resources/Insurance; Housing; Social Support   Sleep  Sleep:No data recorded   Physical Exam: Physical Exam Vitals and nursing note reviewed.  Constitutional:      Appearance: Normal appearance. She is normal weight.  Neurological:     General: No focal deficit present.     Mental Status: She  is alert and oriented to person, place, and time.  Psychiatric:        Attention and Perception: Attention and perception normal.        Mood and Affect: Affect normal. Mood is elated.        Speech: Speech is tangential.        Behavior: Behavior normal. Behavior is cooperative.        Thought Content: Thought content is paranoid.        Cognition and Memory: Cognition is impaired. Memory is impaired.        Judgment: Judgment normal.   Review of Systems  Constitutional: Negative.   HENT: Negative.    Eyes: Negative.   Respiratory: Negative.    Cardiovascular: Negative.    Gastrointestinal: Negative.   Genitourinary: Negative.   Musculoskeletal: Negative.   Skin: Negative.   Neurological: Negative.   Endo/Heme/Allergies: Negative.   Psychiatric/Behavioral:  Positive for depression and memory loss. The patient is nervous/anxious and has insomnia.   Blood pressure (!) 142/59, pulse 91, temperature 97.8 F (36.6 C), temperature source Oral, resp. rate 20, height 5\' 1"  (1.549 m), weight 48.5 kg, SpO2 100 %. Body mass index is 20.22 kg/m.   Treatment Plan Summary: Daily contact with patient to assess and evaluate symptoms and progress in treatment, Medication management, and Plan continue current medications.  Parks Ranger, DO 11/13/2021, 11:58 AM

## 2021-11-13 NOTE — Progress Notes (Signed)
Recreation Therapy Notes    Date: 11/13/2021   Time: 1:15pm    Location: Craft room     Behavioral response: N/A   Intervention Topic: Strengths     Discussion/Intervention: Patient refused to attend group.   Clinical Observations/Feedback:  Patient refused to attend group.   Sharicka Pogorzelski LRT/CTRS        Lew Prout 11/13/2021 1:38 PM

## 2021-11-13 NOTE — BHH Counselor (Signed)
CSW spoke with Coordinator, Ok Anis, [phone] at [facility].   Shannon Sutton stated that pt is able to return back to the assisted living facility. She stated that pt is able to come back once pt is "better,"referring to pt being able to disclose regarding her history of self-harming behaviors and pt no longer being in denial regarding the incident that led her hospitalization.   She stated that in order for pt to return she would need an updated FL2 and providers documentation stating that pt is psychiatrically cleared and has the capacity to live in an environment that is not controlled like pt's assisted living.   She stated that they can provide transportation for pt to and from therapy appointments in terms of consideration for after care planning.   CSW will follow up with pt regarding follow up care and options for referrals.   Shannon Sutton, MSW, LCSW-A 2/1/20233:23 PM

## 2021-11-13 NOTE — Progress Notes (Signed)
Patient states that she did not sleep good last night.patient denies SI/HI/AVH. Patient endorses depression and anxiety a 9/10. Patient still reports not having a good BM, per patient " I feel like I have to go but then when I try am not able to do anything". Patient ate breakfast in the dayroom with good appetite. Scheduled medications administered. Patient requested PRN  mylanta r/t indigestion.Patient stated she would like to sleep better. Patient verbally contracts for safety, Q 15 minutes safety checks maintained. Will continue to monitor.

## 2021-11-13 NOTE — Plan of Care (Signed)
°  Problem: Education: Goal: Emotional status will improve Outcome: Not Progressing Goal: Mental status will improve Outcome: Progressing Goal: Verbalization of understanding the information provided will improve Outcome: Progressing   Problem: Activity: Goal: Interest or engagement in activities will improve Outcome: Not Progressing

## 2021-11-13 NOTE — Progress Notes (Signed)
Patient slightly irritable this shift. Asked staff for a do not disturb. Reports staff wake her up with rounding at night and she wants it to stop. Informed patient that its for her safety. Denies any SI, HI, AVH. Medication compliant and asked now would writer leave her alone. No interaction with peers, minimal with staff.  Encouragement and support provided. Safety checks maintained. Medications given as prescribed. Pt remains safe on unit with q 15 min checks.

## 2021-11-13 NOTE — Progress Notes (Signed)
Pt lying in bed with eyes open; irritable, cooperative. Pt states "I feel tired. I can't sleep because everybody keeps coming in here." Pt denies pain and SI/HI/AVH at this time. Pt reports that she got "maybe a little bit" of sleep last night but is unsure of the number of hours and she describes her appetite as poor, says "I usually eat one meal a day." Pt expresses feelings of anxiety and depression, stating "Sure, I'm anxious. I can't sleep." Pt requested that staff not enter room so that she can get some sleep. Safety rounds procedure explained to pt; she verbalized understanding. No acute distress noted.

## 2021-11-14 DIAGNOSIS — F332 Major depressive disorder, recurrent severe without psychotic features: Secondary | ICD-10-CM | POA: Diagnosis not present

## 2021-11-14 MED ORDER — QUETIAPINE FUMARATE 100 MG PO TABS
100.0000 mg | ORAL_TABLET | Freq: Every day | ORAL | Status: DC
  Administered 2021-11-14: 100 mg via ORAL
  Filled 2021-11-14: qty 1

## 2021-11-14 NOTE — Progress Notes (Signed)
Patient presents A&O x3. Patient affect is calm and behavior a pleasant and isolated.  Patient denies AVH, SI, HI, endorses depression and anxiety a 9/10 and denies pain at this time. Reports not sleeping well. Patient compliant with all scheduled meds.  Patient ate breakfast in the day room and spent most of her shift in her room. Appropriate interaction with staff and peers observed. Ongoing Q15 minute safety check rounds per unit protocol. Will continue to monitor.

## 2021-11-14 NOTE — Progress Notes (Signed)
[facility] MD Progress Note  11/14/2021 11:10 AM Shannon Sutton  MRN:  [id number] Subjective: Shannon Sutton was woken up last night by the speaker system and can get back to sleep.  She is so fixated on sleep that she gets very anxious depressed and suicidal.  She does have Parkinson's.  So far she is tolerating the Remeron but it is not helping her sleep so we are going to try some Seroquel for sleep and mood.  It is also one of the less D2 blockers and hopefully will help.  Principal Problem: Major depressive disorder, recurrent severe without psychotic features ([place]) Diagnosis: Principal Problem:   Major depressive disorder, recurrent severe without psychotic features ([place])  Total Time spent with patient: 15 minutes  Past Psychiatric History: Dr. 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  Dr Trudie Reed 2009 had a bx/MRI: was on MTX and Prednisone   External hemorrhoids    Fibromyalgia with myofascial pain 11/16/2012   Headaches, cluster    History of CT scan of head 2022   History of EKG 2021   History of mammogram 2022   History of MRI 2022   Brain   Hypercholesterolemia 12/15/2013   Hypertension    Insomnia 11/16/2012   Myalgia and myositis, unspecified    Other disorder of muscle, ligament, and fascia    eosinophilic fascitis   Other specified disease of hair and hair follicles    Pain in joint, multiple sites    Parkinson's disease ([place])    Restless leg syndrome 12/15/2013   Unspecified sleep apnea    Urine incontinence     Past Surgical History:  Procedure Laterality Date   ABDOMINAL HYSTERECTOMY     ABDOMINOPLASTY  2006   "tummy tuck"   BREAST REDUCTION SURGERY  1995   BREAST SURGERY     CATARACT EXTRACTION, BILATERAL      COLONOSCOPY  2018   MUSCLE BIOPSY  2009   DEEP TISSUE   REFRACTIVE SURGERY     TONSILLECTOMY     TOTAL ABDOMINAL HYSTERECTOMY  1999   WISDOM TOOTH EXTRACTION     Family History:  Family History  Problem Relation Age of Onset   Colon polyps Mother    Heart disease Mother    Heart attack Mother    Heart attack Father    Colon cancer Father 18   Heart disease Father    COPD Sister    Emphysema Sister    COPD Brother    Emphysema Brother    Lung cancer Brother    Breast cancer Paternal Grandmother    Heart disease Paternal Grandfather    Colon polyps Paternal Grandfather    Heart disease Other        family history   Parkinson's disease Other    Stomach cancer Neg Hx    Rectal cancer Neg Hx    Esophageal cancer Neg Hx     Social History:  Social History   Substance and Sexual Activity  Alcohol Use Not Currently     Social History   Substance and Sexual Activity  Drug Use No    Social History   Socioeconomic History   Marital status: Divorced  Spouse name: Not on file   Number of children: 2   Years of education: Not on file   Highest education level: Some college, no degree  Occupational History   Occupation: Retired    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  Tobacco Use   Smoking status: Former    Types: Cigarettes    Quit date: 12/19/1986    Years since quitting: 34.9   Smokeless tobacco: Never  Vaping Use   Vaping Use: Never used  Substance and Sexual Activity   Alcohol use: Not Currently   Drug use: No   Sexual activity: Yes    Birth control/protection: Surgical, Post-menopausal  Other Topics Concern   Not on file  Social History Narrative   Diet:      Caffeine: occasionally iced coffee      Married, if yes what year: Divorced      Do you live in a house, apartment, assisted living, condo, trailer, ect: House      Is it one or more stories: 1      How many persons live in your home? 1      Pets: Dog      Highest level  or education completed: Some college      Current/Past profession: Patent examiner      Exercise:  No                Type and how often:          Living Will: Yes   DNR: No and would like to discuss   POA/HPOA: Yes      Functional Status:   Do you have difficulty bathing or dressing yourself? No   Do you have difficulty preparing food or eating? Yes, occasionally   Do you have difficulty managing your medications? Yes   Do you have difficulty managing your finances? Yes ( forget password and double pays   Do you have difficulty affording your medications? No   Social Determinants of Radio broadcast assistant Strain: Low Risk    Difficulty of Paying Living Expenses: Not hard at all  Food Insecurity: No Food Insecurity   Worried About Charity fundraiser in the Last Year: Never true   Arboriculturist in the Last Year: Never true  Transportation Needs: Unknown   Lack of Transportation (Medical): No   Lack of Transportation (Non-Medical): Patient refused  Physical Activity: Unknown   Days of Exercise per Week: 0 days   Minutes of Exercise per Session: Not on file  Stress: Stress Concern Present   Feeling of Stress : Very much  Social Connections: Socially Isolated   Frequency of Communication with Friends and Family: More than three times a week   Frequency of Social Gatherings with Friends and Family: Three times a week   Attends Religious Services: Never   Active Member of Clubs or Organizations: No   Attends Music therapist: Not on file   Marital Status: Divorced   Additional Social History:                         Sleep: Poor  Appetite:  Fair  Current Medications: Current Facility-Administered Medications  Medication Dose Route Frequency Provider Last Rate Last Admin   acetaminophen (TYLENOL) tablet 500 mg  500 mg Oral Q6H PRN Patrecia Pour, NP   500 mg at 11/13/21 2154   alum & mag hydroxide-simeth (MAALOX/MYLANTA)  200-200-20  MG/5ML suspension 30 mL  30 mL Oral Q4H PRN Patrecia Pour, NP   30 mL at 11/13/21 2536   carbidopa-levodopa (SINEMET IR) 25-100 MG per tablet immediate release 1 tablet  1 tablet Oral TID Patrecia Pour, NP   1 tablet at 11/14/21 0800   clonazePAM (KLONOPIN) tablet 1 mg  1 mg Oral QHS Patrecia Pour, NP   1 mg at 11/13/21 2149   donepezil (ARICEPT) tablet 5 mg  5 mg Oral QHS Patrecia Pour, NP   5 mg at 11/13/21 2149   fluticasone (FLONASE) 50 MCG/ACT nasal spray 1 spray  1 spray Each Nare Daily Rulon Sera, MD   1 spray at 11/14/21 1028   magnesium hydroxide (MILK OF MAGNESIA) suspension 30 mL  30 mL Oral Daily PRN Patrecia Pour, NP   30 mL at 11/11/21 2059   mirtazapine (REMERON) tablet 15 mg  15 mg Oral QHS Parks Ranger, DO   15 mg at 11/13/21 2149   QUEtiapine (SEROQUEL) tablet 100 mg  100 mg Oral QHS Parks Ranger, DO       rOPINIRole (REQUIP) tablet 1 mg  1 mg Oral TID Patrecia Pour, NP   1 mg at 11/14/21 0800   senna-docusate (Senokot-S) tablet 2 tablet  2 tablet Oral QPC breakfast Parks Ranger, DO   2 tablet at 11/14/21 0800   traZODone (DESYREL) tablet 150 mg  150 mg Oral QHS PRN Parks Ranger, DO   150 mg at 11/13/21 2155    Lab Results: No results found for this or any previous visit (from the past 48 hour(s)).  Blood Alcohol level:  Lab Results  Component Value Date   ETH <10 11/08/2021   ETH <10 64/40/3474    Metabolic Disorder Labs: Lab Results  Component Value Date   HGBA1C 4.7 (L) 11/08/2021   MPG 88.19 11/08/2021   No results found for: PROLACTIN Lab Results  Component Value Date   CHOL 153 11/08/2021   TRIG 62 11/08/2021   HDL 54 11/08/2021   CHOLHDL 2.8 11/08/2021   VLDL 12 11/08/2021   LDLCALC 87 11/08/2021   LDLCALC 69 06/20/2020    Physical Findings: AIMS:  , ,  ,  ,    CIWA:    COWS:     Musculoskeletal: Strength & Muscle Tone: within normal limits Gait & Station: normal Patient leans:  N/A  Psychiatric Specialty Exam:  Presentation  General Appearance: Appropriate for Environment; Casual  Eye Contact:Fair  Speech:Clear and Coherent; Normal Rate  Speech Volume:Normal  Handedness:No data recorded  Mood and Affect  Mood:Depressed; Anxious  Affect:Congruent   Thought Process  Thought Processes:Coherent  Descriptions of Associations:Intact  Orientation:Partial  Thought Content:Logical  History of Schizophrenia/Schizoaffective disorder:No  Duration of Psychotic Symptoms:No data recorded Hallucinations:No data recorded Ideas of Reference:None  Suicidal Thoughts:No data recorded Homicidal Thoughts:No data recorded  Sensorium  Memory:Immediate Fair; Recent Fair; Remote Fair  Judgment:Impaired  Insight:Present   Executive Functions  Concentration:Fair  Attention Span:Fair  [place]  Language:Good   Psychomotor Activity  Psychomotor Activity:No data recorded  Assets  Assets:Desire for Improvement; Financial Resources/Insurance; Housing; Social Support   Sleep  Sleep:No data recorded   Physical Exam: Physical Exam Vitals and nursing note reviewed.  Constitutional:      Appearance: Normal appearance. She is normal weight.  Neurological:     General: No focal deficit present.     Mental Status: She is alert  and oriented to person, place, and time.  Psychiatric:        Attention and Perception: Attention and perception normal.        Mood and Affect: Mood is anxious and depressed. Affect is labile.        Speech: Speech is tangential.        Behavior: Behavior is agitated.        Thought Content: Thought content is paranoid.        Cognition and Memory: Cognition is impaired. Memory is impaired.        Judgment: Judgment is impulsive.   Review of Systems  Constitutional: Negative.   HENT: Negative.    Eyes: Negative.   Respiratory: Negative.    Cardiovascular: Negative.   Gastrointestinal:  Negative.   Genitourinary: Negative.   Musculoskeletal: Negative.   Skin: Negative.   Neurological: Negative.   Endo/Heme/Allergies: Negative.   Psychiatric/Behavioral:  Positive for depression and suicidal ideas. The patient is nervous/anxious and has insomnia.   Blood pressure (!) 146/59, pulse 99, temperature 97.9 F (36.6 C), temperature source Oral, resp. rate 18, height 5\' 1"  (1.549 m), weight 48.5 kg, SpO2 100 %. Body mass index is 20.22 kg/m.   Treatment Plan Summary: Daily contact with patient to assess and evaluate symptoms and progress in treatment.  Start Seroquel 100 mg at bedtime.  [place], DO 11/14/2021, 11:10 AM

## 2021-11-14 NOTE — Progress Notes (Signed)
Recreation Therapy Notes Date: 11/14/2021  Time: 1:30pm    Location: Craft room    Behavioral response: N/A   Intervention Topic: Values   Discussion/Intervention: Patient refused to attend group.   Clinical Observations/Feedback:  Patient refused to attend group.   Tell Rozelle LRT/CTRS          Mayre Bury 11/14/2021 2:10 PM

## 2021-11-14 NOTE — Progress Notes (Signed)
Pt lying in bed with eyes open; calm and cooperative. Pt states "I'm not feeling very good. I thought I was going to feel better but I feel worse." Pt unable to describe specifically how she is feeling "worse". Unable to assess pain because when asked if she is hurting, pt states "I'm real bony so it hurts to lay on my stomach" but she is unable to verbalize exactly where it hurts when she lies on her stomach. Pt denies SI/HI at this time; however, she states "I'm not sure" when asked if her mind is playing tricks on her and if she sees things that are not there or hears voices in her head. When asked to explain, pt states "I have Parkinson's, so sometimes I think my mind does play tricks on me." She is unable to specify how her mind plays tricks on her at times. When asked how she slept last night, pt replied "I never sleep real good." She is unable to describe her appetite and just says "I'm a little bit hungry." Pt expresses feelings of anxiety, stating "yes, about all this, about anything, about not being able to sleep." When asked if she feels depressed, she says "Sure because I'm not able to sleep." Pt offered and provided snack. She says that she thinks that she missed dinner. No acute distress noted.

## 2021-11-14 NOTE — Progress Notes (Signed)
Pt approached nurses' station to inquire about the overhead announcement. Informed pt that announcement was regarding another unit. Pt states that she was asleep, awakened by the overhead announcement and currently unable to go back to sleep. Pt encouraged to lie down and rest if she is unable to go back to sleep. Pt agreed and returned to room. No acute distress noted.

## 2021-11-14 NOTE — Group Note (Unsigned)
LCSW Group Therapy Note  Group Date: 11/14/2021 Start Time: 1300 End Time: 1400   Type of Therapy and Topic:  Group Therapy - How To Cope with Nervousness about Discharge   Participation Level:  {BHH PARTICIPATION LFYBO:17510}   Description of Group This process group involved identification of patients' feelings about discharge. Some of them are scheduled to be discharged soon, while others are new admissions, but each of them was asked to share thoughts and feelings surrounding discharge from the hospital. One common theme was that they are excited at the prospect of going home, while another was that many of them are apprehensive about sharing why they were hospitalized. Patients were given the opportunity to discuss these feelings with their peers in preparation for discharge.  Therapeutic Goals  1. Patient will identify their overall feelings about pending discharge. 2. Patient will think about how they might proactively address issues that they believe will once again arise once they get home (i.e. with parents). 3. Patients will participate in discussion about having hope for change.   Summary of Patient Progress:  *** was very active throughout the session. *** demonstrated *** insight into the subject matter, and proved open to input from peers and feedback from [place]. *** was respectful of peers and participated throughout the entire session.   Therapeutic Modalities Cognitive Behavioral Therapy   Larose Kells 11/14/2021  1:41 PM

## 2021-11-15 DIAGNOSIS — F332 Major depressive disorder, recurrent severe without psychotic features: Secondary | ICD-10-CM | POA: Diagnosis not present

## 2021-11-15 MED ORDER — QUETIAPINE FUMARATE 25 MG PO TABS
150.0000 mg | ORAL_TABLET | Freq: Every day | ORAL | Status: DC
  Administered 2021-11-15 – 2021-11-18 (×4): 150 mg via ORAL
  Filled 2021-11-15 (×4): qty 2

## 2021-11-15 NOTE — Progress Notes (Signed)
Recreation Therapy Notes  Date: 11/15/2021  Time: 1:30pm    Location: Craft room    Behavioral response: N/A   Intervention Topic: Time Management   Discussion/Intervention: Patient refused to attend group.   Clinical Observations/Feedback:  Patient refused to attend group.   Ulyana Pitones LRT/CTRS         Lexia Vandevender 11/15/2021 3:38 PM

## 2021-11-15 NOTE — Group Note (Signed)
[facility] LCSW Group Therapy Note   Group Date: 11/15/2021 Start Time: 3662 End Time: 1430   Type of Therapy/Topic:  Group Therapy:  Balance in Life  Participation Level:  Active   Description of Group:    This group will address the concept of balance and how it feels and looks when one is unbalanced. Patients will be encouraged to process areas in their lives that are out of balance, and identify reasons for remaining unbalanced. Facilitators will guide patients utilizing problem- solving interventions to address and correct the stressor making their life unbalanced. Understanding and applying boundaries will be explored and addressed for obtaining  and maintaining a balanced life. Patients will be encouraged to explore ways to assertively make their unbalanced needs known to significant others in their lives, using other group members and facilitator for support and feedback.  Therapeutic Goals: Patient will identify two or more emotions or situations they have that consume much of in their lives. Patient will identify signs/triggers that life has become out of balance:  Patient will identify two ways to set boundaries in order to achieve balance in their lives:  Patient will demonstrate ability to communicate their needs through discussion and/or role plays  Summary of Patient Progress:    Patient was present for the entirety of the individual session as patient was the only participant. Patient was an active listener and participated in the topic of discussion. She said that she is still struggling to get sleep and that she is still adjusting to the unit. She stated that she worries about using the bathroom on herself and that is preoccupies her mind so much that she cannot rest.  When asked about what she does to find balance to find rest pt stated that quiet spaces were helpful to her. She stated that she is open to psychiatry and potentially therapy when she goes back to her assisted living  facility.     Therapeutic Modalities:   Cognitive Behavioral Therapy Solution-Focused Therapy Assertiveness Training   Hyndman A [place], LCSWA

## 2021-11-15 NOTE — Progress Notes (Signed)
[facility] MD Progress Note  11/15/2021 11:06 AM Shannon Sutton  MRN:  [id number] Subjective: Shannon Sutton continues to complain of not sleeping.  She was very happy when I informed her that Shannon Sutton is going to take her back.  She has a very odd affect and seems to be somewhat histrionic.  She is taking her medications as prescribed and denies any side effects.  She is not really able to tell me whether the Seroquel helped her sleep or not.  She did not have any problems with.  It is unclear whether she did sleep or not.  The way that she was admitted she definitely has some cluster B traits.  We will go up on her Seroquel tonight and hopefully get her back to [place] on Monday.  Principal Problem: Major depressive disorder, recurrent severe without psychotic features ([place]) Diagnosis: Principal Problem:   Major depressive disorder, recurrent severe without psychotic features ([place])  Total Time spent with patient: 15 minutes  Past Psychiatric History: Dr. 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  Dr Trudie Reed 2009 had a bx/MRI: was on MTX and Prednisone   External hemorrhoids    Fibromyalgia with myofascial pain 11/16/2012   Headaches, cluster    History of CT scan of head 2022   History of EKG 2021   History of mammogram 2022   History of MRI 2022   Brain   Hypercholesterolemia 12/15/2013   Hypertension    Insomnia 11/16/2012   Myalgia and myositis, unspecified    Other disorder of muscle, ligament, and fascia    eosinophilic fascitis   Other specified disease of hair and hair follicles    Pain in joint, multiple sites    Parkinson's disease ([place])    Restless leg syndrome 12/15/2013   Unspecified sleep apnea    Urine incontinence     Past  Surgical History:  Procedure Laterality Date   ABDOMINAL HYSTERECTOMY     ABDOMINOPLASTY  2006   "tummy tuck"   BREAST REDUCTION SURGERY  1995   BREAST SURGERY     CATARACT EXTRACTION, BILATERAL     COLONOSCOPY  2018   MUSCLE BIOPSY  2009   DEEP TISSUE   REFRACTIVE SURGERY     TONSILLECTOMY     TOTAL ABDOMINAL HYSTERECTOMY  1999   WISDOM TOOTH EXTRACTION     Family History:  Family History  Problem Relation Age of Onset   Colon polyps Mother    Heart disease Mother    Heart attack Mother    Heart attack Father    Colon cancer Father 67   Heart disease Father    COPD Sister    Emphysema Sister    COPD Brother    Emphysema Brother    Lung cancer Brother    Breast cancer Paternal Grandmother    Heart disease Paternal Grandfather    Colon polyps Paternal Grandfather    Heart disease Other        family history   Parkinson's disease Other    Stomach cancer Neg Hx    Rectal cancer Neg Hx    Esophageal cancer Neg Hx    Social History:  Social History   Substance and Sexual  Activity  Alcohol Use Not Currently     Social History   Substance and Sexual Activity  Drug Use No    Social History   Socioeconomic History   Marital status: Divorced    Spouse name: Not on file   Number of children: 2   Years of education: Not on file   Highest education level: Some college, no degree  Occupational History   Occupation: Retired    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  Tobacco Use   Smoking status: Former    Types: Cigarettes    Quit date: 12/19/1986    Years since quitting: 34.9   Smokeless tobacco: Never  Vaping Use   Vaping Use: Never used  Substance and Sexual Activity   Alcohol use: Not Currently   Drug use: No   Sexual activity: Yes    Birth control/protection: Surgical, Post-menopausal  Other Topics Concern   Not on file  Social History Narrative   Diet:      Caffeine: occasionally iced coffee      Married, if yes what year: Divorced       Do you live in a house, apartment, assisted living, condo, trailer, ect: House      Is it one or more stories: 1      How many persons live in your home? 1      Pets: Dog      Highest level or education completed: Some college      Current/Past profession: Patent examiner      Exercise:  No                Type and how often:          Living Will: Yes   DNR: No and would like to discuss   POA/HPOA: Yes      Functional Status:   Do you have difficulty bathing or dressing yourself? No   Do you have difficulty preparing food or eating? Yes, occasionally   Do you have difficulty managing your medications? Yes   Do you have difficulty managing your finances? Yes ( forget password and double pays   Do you have difficulty affording your medications? No   Social Determinants of Radio broadcast assistant Strain: Low Risk    Difficulty of Paying Living Expenses: Not hard at all  Food Insecurity: No Food Insecurity   Worried About Sutton fundraiser in the Last Year: Never true   Arboriculturist in the Last Year: Never true  Transportation Needs: Unknown   Lack of Transportation (Medical): No   Lack of Transportation (Non-Medical): Patient refused  Physical Activity: Unknown   Days of Exercise per Week: 0 days   Minutes of Exercise per Session: Not on file  Stress: Stress Concern Present   Feeling of Stress : Very much  Social Connections: Socially Isolated   Frequency of Communication with Friends and Family: More than three times a week   Frequency of Social Gatherings with Friends and Family: Three times a week   Attends Religious Services: Never   Active Member of Clubs or Organizations: No   Attends Music therapist: Not on file   Marital Status: Divorced   Additional Social History:                         Sleep: Poor  Appetite:  Fair  Current Medications: Current Facility-Administered Medications  Medication  Dose Route  Frequency Provider Last Rate Last Admin   acetaminophen (TYLENOL) tablet 500 mg  500 mg Oral Q6H PRN Patrecia Pour, NP   500 mg at 11/13/21 2154   alum & mag hydroxide-simeth (MAALOX/MYLANTA) 200-200-20 MG/5ML suspension 30 mL  30 mL Oral Q4H PRN Patrecia Pour, NP   30 mL at 11/13/21 0834   carbidopa-levodopa (SINEMET IR) 25-100 MG per tablet immediate release 1 tablet  1 tablet Oral TID Patrecia Pour, NP   1 tablet at 11/15/21 6283   clonazePAM (KLONOPIN) tablet 1 mg  1 mg Oral QHS Patrecia Pour, NP   1 mg at 11/14/21 2100   donepezil (ARICEPT) tablet 5 mg  5 mg Oral QHS Patrecia Pour, NP   5 mg at 11/14/21 2100   fluticasone (FLONASE) 50 MCG/ACT nasal spray 1 spray  1 spray Each Nare Daily Rulon Sera, MD   1 spray at 11/15/21 0914   magnesium hydroxide (MILK OF MAGNESIA) suspension 30 mL  30 mL Oral Daily PRN Patrecia Pour, NP   30 mL at 11/11/21 2059   mirtazapine (REMERON) tablet 15 mg  15 mg Oral QHS Parks Ranger, DO   15 mg at 11/14/21 2100   QUEtiapine (SEROQUEL) tablet 150 mg  150 mg Oral QHS Parks Ranger, DO       rOPINIRole (REQUIP) tablet 1 mg  1 mg Oral TID Patrecia Pour, NP   1 mg at 11/15/21 1517   senna-docusate (Senokot-S) tablet 2 tablet  2 tablet Oral QPC breakfast Parks Ranger, DO   2 tablet at 11/15/21 0813   traZODone (DESYREL) tablet 150 mg  150 mg Oral QHS PRN Parks Ranger, DO   150 mg at 11/13/21 2155    Lab Results: No results found for this or any previous visit (from the past 48 hour(s)).  Blood Alcohol level:  Lab Results  Component Value Date   ETH <10 11/08/2021   ETH <10 61/60/7371    Metabolic Disorder Labs: Lab Results  Component Value Date   HGBA1C 4.7 (L) 11/08/2021   MPG 88.19 11/08/2021   No results found for: PROLACTIN Lab Results  Component Value Date   CHOL 153 11/08/2021   TRIG 62 11/08/2021   HDL 54 11/08/2021   CHOLHDL 2.8 11/08/2021   VLDL 12 11/08/2021   LDLCALC 87 11/08/2021    LDLCALC 69 06/20/2020    Physical Findings: AIMS:  , ,  ,  ,    CIWA:    COWS:     Musculoskeletal: Strength & Muscle Tone: within normal limits Gait & Station: normal Patient leans: N/A  Psychiatric Specialty Exam:  Presentation  General Appearance: Appropriate for Environment; Casual  Eye Contact:Fair  Speech:Clear and Coherent; Normal Rate  Speech Volume:Normal  Handedness:No data recorded  Mood and Affect  Mood:Depressed; Anxious  Affect:Congruent   Thought Process  Thought Processes:Coherent  Descriptions of Associations:Intact  Orientation:Partial  Thought Content:Logical  History of Schizophrenia/Schizoaffective disorder:No  Duration of Psychotic Symptoms:No data recorded Hallucinations:No data recorded Ideas of Reference:None  Suicidal Thoughts:No data recorded Homicidal Thoughts:No data recorded  Sensorium  Memory:Immediate Fair; Recent Fair; Remote Fair  Judgment:Impaired  Insight:Present   Executive Functions  Concentration:Fair  Attention Span:Fair  [place]  Language:Good   Psychomotor Activity  Psychomotor Activity:No data recorded  Assets  Assets:Desire for Improvement; Financial Resources/Insurance; Housing; Social Support   Sleep  Sleep:No data recorded   Physical Exam: Physical Exam  Vitals and nursing note reviewed.  Constitutional:      Appearance: Normal appearance. She is normal weight.  Neurological:     General: No focal deficit present.     Mental Status: She is alert and oriented to person, place, and time.  Psychiatric:        Attention and Perception: Attention and perception normal.        Mood and Affect: Mood is anxious and depressed. Affect is labile.        Speech: Speech normal.        Behavior: Behavior normal. Behavior is cooperative.        Thought Content: Thought content normal.        Cognition and Memory: Cognition and memory normal.        Judgment:  Judgment normal.   Review of Systems  Constitutional: Negative.   HENT: Negative.    Eyes: Negative.   Respiratory: Negative.    Cardiovascular: Negative.   Gastrointestinal: Negative.   Genitourinary: Negative.   Musculoskeletal: Negative.   Skin: Negative.   Neurological: Negative.   Endo/Heme/Allergies: Negative.   Psychiatric/Behavioral:  Positive for depression. The patient has insomnia.   Blood pressure (!) 143/69, pulse (!) 102, temperature 98 F (36.7 C), temperature source Oral, resp. rate 18, height 5\' 1"  (1.549 m), weight 48.5 kg, SpO2 100 %. Body mass index is 20.22 kg/m.   Treatment Plan Summary: Daily contact with patient to assess and evaluate symptoms and progress in treatment, Medication management, and Plan Increase Seroquel  [place] Ranger, DO 11/15/2021, 11:06 AM

## 2021-11-15 NOTE — Plan of Care (Signed)
Patient presents A&O x3. Patient is irritated this morning and has anxiety and depression due to not getting enough sleep. pleasant and isolated. Patient denies AVH, SI, HI. Patient compliant with all scheduled meds. Patient ate breakfast in the day room and spent most of her shift in her room. Appropriate interaction with staff and peers observed. Q15 minute safety checks continued per unit protocol. Will continue to monitor.   Problem: Education: Goal: Knowledge of [facility] Education information/materials will improve Outcome: Progressing Goal: Emotional status will improve Outcome: Progressing Goal: Mental status will improve Outcome: Progressing Goal: Verbalization of understanding the information provided will improve Outcome: Progressing   Problem: Activity: Goal: Interest or engagement in activities will improve Outcome: Progressing Goal: Sleeping patterns will improve Outcome: Progressing   Problem: Coping: Goal: Ability to verbalize frustrations and anger appropriately will improve Outcome: Progressing Goal: Ability to demonstrate self-control will improve Outcome: Progressing   Problem: Health Behavior/Discharge Planning: Goal: Identification of resources available to assist in meeting health care needs will improve Outcome: Progressing Goal: Compliance with treatment plan for underlying cause of condition will improve Outcome: Progressing   Problem: Physical Regulation: Goal: Ability to maintain clinical measurements within normal limits will improve Outcome: Progressing   Problem: Safety: Goal: Periods of time without injury will increase Outcome: Progressing   Problem: Education: Goal: Ability to incorporate positive changes in behavior to improve self-esteem will improve Outcome: Progressing   Problem: Health Behavior/Discharge Planning: Goal: Ability to identify and utilize available resources and services will improve Outcome: Progressing Goal:  Ability to remain free from injury will improve Outcome: Progressing   Problem: Self-Concept: Goal: Will verbalize positive feelings about self Outcome: Progressing   Problem: Skin Integrity: Goal: Demonstration of wound healing without infection will improve Outcome: Progressing

## 2021-11-16 DIAGNOSIS — F332 Major depressive disorder, recurrent severe without psychotic features: Secondary | ICD-10-CM | POA: Diagnosis not present

## 2021-11-16 NOTE — Progress Notes (Signed)
D: Pt alert and oriented. Pt rates her anxiety 9/10, denies depression at this time. Pt denies experiencing any pain at this time. Pt denies experiencing any SI/HI, or AVH at this time.   A: Scheduled medications administered to patient including Clonazepam 1 mg for anxiety. Support and encouragement provided. Routine verbal contact and safety checks conducted q15 minutes.  R: No adverse drug reactions noted. Pt is agreeable to notifying staff with any safety concerns. Pt complaint with medications. Pt interacts minimally with others on the unit. Pt remains safe at this time. Will continue to monitor.

## 2021-11-16 NOTE — BH IP Treatment Plan (Signed)
Interdisciplinary Treatment and Diagnostic Plan Update  11/16/2021 Time of Session: 10:30AM Shannon Sutton MRN: [id number]  Principal Diagnosis: Major depressive disorder, recurrent severe without psychotic features [facility])  Secondary Diagnoses: Principal Problem:   Major depressive disorder, recurrent severe without psychotic features ([place])   Current Medications:  Current Facility-Administered Medications  Medication Dose Route Frequency Provider Last Rate Last Admin   acetaminophen (TYLENOL) tablet 500 mg  500 mg Oral Q6H PRN Patrecia Pour, NP   500 mg at 11/16/21 0834   alum & mag hydroxide-simeth (MAALOX/MYLANTA) 200-200-20 MG/5ML suspension 30 mL  30 mL Oral Q4H PRN Patrecia Pour, NP   30 mL at 11/13/21 0834   carbidopa-levodopa (SINEMET IR) 25-100 MG per tablet immediate release 1 tablet  1 tablet Oral TID Patrecia Pour, NP   1 tablet at 11/16/21 0802   clonazePAM (KLONOPIN) tablet 1 mg  1 mg Oral QHS Patrecia Pour, NP   1 mg at 11/15/21 2133   donepezil (ARICEPT) tablet 5 mg  5 mg Oral QHS Patrecia Pour, NP   5 mg at 11/15/21 2133   fluticasone (FLONASE) 50 MCG/ACT nasal spray 1 spray  1 spray Each Nare Daily Rulon Sera, MD   1 spray at 11/16/21 0093   magnesium hydroxide (MILK OF MAGNESIA) suspension 30 mL  30 mL Oral Daily PRN Patrecia Pour, NP   30 mL at 11/11/21 2059   mirtazapine (REMERON) tablet 15 mg  15 mg Oral QHS Parks Ranger, DO   15 mg at 11/15/21 2133   QUEtiapine (SEROQUEL) tablet 150 mg  150 mg Oral QHS Parks Ranger, DO   150 mg at 11/15/21 2135   rOPINIRole (REQUIP) tablet 1 mg  1 mg Oral TID Patrecia Pour, NP   1 mg at 11/16/21 0802   senna-docusate (Senokot-S) tablet 2 tablet  2 tablet Oral QPC breakfast Parks Ranger, DO   2 tablet at 11/16/21 8182   traZODone (DESYREL) tablet 150 mg  150 mg Oral QHS PRN Parks Ranger, DO   150 mg at 11/13/21 2155   PTA Medications: Medications Prior to Admission  Medication  Sig Dispense Refill Last Dose   atorvastatin (LIPITOR) 40 MG tablet Take 1 tablet by mouth once daily (Patient taking differently: Take 40 mg by mouth daily.) 90 tablet 3    carbidopa-levodopa (SINEMET IR) 25-100 MG tablet Take 1 tablet by mouth 3 (three) times daily.      clonazePAM (KLONOPIN) 0.5 MG tablet Take 2 tablets (1 mg total) by mouth at bedtime. 2 qam & 2 at dinner (Patient taking differently: Take 1 mg by mouth at bedtime.)      donepezil (ARICEPT) 5 MG tablet Take 1 tablet (5 mg total) by mouth at bedtime.      Melatonin 10 MG TABS Take 5-10 mg by mouth at bedtime.      ondansetron (ZOFRAN) 4 MG tablet TAKE 1 TABLET BY MOUTH EVERY 8 HOURS AS NEEDED FOR NAUSEA OR VOMITING (Patient taking differently: Take 4 mg by mouth every 8 (eight) hours as needed.) 45 tablet 0    propranolol (INDERAL) 20 MG tablet Take 1 tablet (20 mg total) by mouth 2 (two) times daily. (Patient taking differently: Take 20 mg by mouth every evening.) 60 tablet 2    rOPINIRole (REQUIP) 1 MG tablet Take 1 mg by mouth 3 (three) times daily.      traZODone (DESYREL) 150 MG tablet Take 150 mg by mouth at  bedtime.       Patient Stressors: Loss of independence    Patient Strengths: Supportive family/friends   Treatment Modalities: Medication Management, Group therapy, Case management,  1 to 1 session with clinician, Psychoeducation, Recreational therapy.   Physician Treatment Plan for Primary Diagnosis: Major depressive disorder, recurrent severe without psychotic features ([place]) Long Term Goal(s): Improvement in symptoms so as ready for discharge   Short Term Goals: Ability to demonstrate self-control will improve Ability to identify changes in lifestyle to reduce recurrence of condition will improve  Medication Management: Evaluate patient's response, side effects, and tolerance of medication regimen.  Therapeutic Interventions: 1 to 1 sessions, Unit Group sessions and Medication administration.  Evaluation  of Outcomes: Progressing  Physician Treatment Plan for Secondary Diagnosis: Principal Problem:   Major depressive disorder, recurrent severe without psychotic features ([place])  Long Term Goal(s): Improvement in symptoms so as ready for discharge   Short Term Goals: Ability to demonstrate self-control will improve Ability to identify changes in lifestyle to reduce recurrence of condition will improve     Medication Management: Evaluate patient's response, side effects, and tolerance of medication regimen.  Therapeutic Interventions: 1 to 1 sessions, Unit Group sessions and Medication administration.  Evaluation of Outcomes: Progressing   RN Treatment Plan for Primary Diagnosis: Major depressive disorder, recurrent severe without psychotic features ([place]) Long Term Goal(s): Knowledge of disease and therapeutic regimen to maintain health will improve  Short Term Goals: Ability to remain free from injury will improve, Ability to demonstrate self-control, Ability to participate in decision making will improve, Ability to verbalize feelings will improve, Ability to disclose and discuss suicidal ideas, Ability to identify and develop effective coping behaviors will improve, and Compliance with prescribed medications will improve  Medication Management: RN will administer medications as ordered by provider, will assess and evaluate patient's response and provide education to patient for prescribed medication. RN will report any adverse and/or side effects to prescribing provider.  Therapeutic Interventions: 1 on 1 counseling sessions, Psychoeducation, Medication administration, Evaluate responses to treatment, Monitor vital signs and CBGs as ordered, Perform/monitor CIWA, COWS, AIMS and Fall Risk screenings as ordered, Perform wound care treatments as ordered.  Evaluation of Outcomes: Progressing   LCSW Treatment Plan for Primary Diagnosis: Major depressive disorder, recurrent severe without  psychotic features ([place]) Long Term Goal(s): Safe transition to appropriate next level of care at discharge, Engage patient in therapeutic group addressing interpersonal concerns.  Short Term Goals: Engage patient in aftercare planning with referrals and resources, Increase social support, Increase ability to appropriately verbalize feelings, Increase emotional regulation, Facilitate acceptance of mental health diagnosis and concerns, Identify triggers associated with mental health/substance abuse issues, and Increase skills for wellness and recovery  Therapeutic Interventions: Assess for all discharge needs, 1 to 1 time with Social worker, Explore available resources and support systems, Assess for adequacy in community support network, Educate family and significant other(s) on suicide prevention, Complete Psychosocial Assessment, Interpersonal group therapy.  Evaluation of Outcomes: Progressing   Progress in Treatment: Attending groups: Yes. Participating in groups: Yes. Taking medication as prescribed: Yes. Toleration medication: Yes. Family/Significant other contact made: Yes, individual(s) contacted:  Patient's daughter. Patient understands diagnosis: Yes. Discussing patient identified problems/goals with staff: Yes. Medical problems stabilized or resolved: Yes. Denies suicidal/homicidal ideation: Yes. Issues/concerns per patient self-inventory: No. Other: None.  New problem(s) identified: No, Describe:  None.  New Short Term/Long Term Goal(s): "work on feeling better" Update 11/16/2021: No changes at this time.     Patient Goals:  "  work on feeling better"  Update 11/16/2021: No changes at this time.   Discharge Plan or Barriers: CSW will assist pt with development of an appropriate discharge/aftercare plan. Update 11/16/2021: Patient to return to [facility] at discharge. CSW will continue to assist patient with discharge/aftercare plan.   Reason for  Continuation of Hospitalization: Depression Medication stabilization  Estimated Length of Stay: TBD   Scribe for Treatment Team: Sherilyn Dacosta 11/16/2021 10:47 AM

## 2021-11-16 NOTE — BHH Suicide Risk Assessment (Signed)
[place] INPATIENT:  Family/Significant Other Suicide Prevention Education  Suicide Prevention Education:  Education Completed; Florene Route (daughter) [phone], has been identified by the patient as the family member/significant other with whom the patient will be residing, and identified as the person(s) who will aid the patient in the event of a mental health crisis (suicidal ideations/suicide attempt).  With written consent from the patient, the family member/significant other has been provided the following suicide prevention education, prior to the and/or following the discharge of the patient.  The suicide prevention education provided includes the following: Suicide risk factors Suicide prevention and interventions National Suicide Hotline telephone number [facility] assessment telephone number [facility] Emergency Assistance [place] and/or Residential Mobile Crisis Unit telephone number  Request made of family/significant other to: Remove weapons (e.g., guns, rifles, knives), all items previously/currently identified as safety concern.   Remove drugs/medications (over-the-counter, prescriptions, illicit drugs), all items previously/currently identified as a safety concern.  The family member/significant other verbalizes understanding of the suicide prevention education information provided.  The family member/significant other agrees to remove the items of safety concern listed above.  Patient's daughter, Florene Route states that she does not have current [place] as patient had previously stated. Daughter hopes to have POA documentation completed with patient after hospital discharge where she or patient's other daughter plan to assume [place] over patient.   Kenna Gilbert Shaquira Moroz 11/16/2021, 3:09 PM

## 2021-11-16 NOTE — Progress Notes (Signed)
[facility] MD Progress Note  11/16/2021 2:26 PM Shannon Sutton  MRN:  [id number]  Principal Problem: Major depressive disorder, recurrent severe without psychotic features [facility]) Diagnosis: Principal Problem:   Major depressive disorder, recurrent severe without psychotic features [facility])  Patient is a  y.o. female who presents to the geriatric psychiatric unit due from her assisted-living facility due to suspicious unsafe behavior.  Interval History Patient was seen today for re-evaluation.  Nursing reports no events overnight. The patient has no issues with performing ADLs.  Patient has been medication compliant.    Subjective:  On assessment patient reports 'I am okay, I guess". She appears odd and hyperfixated on her bowel movement. She is under impression that her return to ALF depends on her constipation relief. Denies any mood-related complaints. Denies suicidal/homicidal ideations. Denies auditory/visual hallucinations. Reports she is unsure if having any side effects from medications.    Labs: no new results for review.  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  Dr Trudie Reed 2009 had a bx/MRI: was on MTX and Prednisone   External hemorrhoids    Fibromyalgia with myofascial pain 11/16/2012   Headaches, cluster    History of CT scan of head 2022   History of EKG 2021   History of mammogram 2022   History of MRI 2022   Brain   Hypercholesterolemia 12/15/2013   Hypertension    Insomnia 11/16/2012   Myalgia and myositis, unspecified    Other disorder of muscle, ligament, and fascia    eosinophilic fascitis   Other specified disease of hair and hair follicles     Pain in joint, multiple sites    Parkinson's disease ([place])    Restless leg syndrome 12/15/2013   Unspecified sleep apnea    Urine incontinence     Past Surgical History:  Procedure Laterality Date   ABDOMINAL HYSTERECTOMY     ABDOMINOPLASTY  2006   "tummy tuck"   BREAST REDUCTION SURGERY  1995   BREAST SURGERY     CATARACT EXTRACTION, BILATERAL     COLONOSCOPY  2018   MUSCLE BIOPSY  2009   DEEP TISSUE   REFRACTIVE SURGERY     TONSILLECTOMY     TOTAL ABDOMINAL HYSTERECTOMY  1999   WISDOM TOOTH EXTRACTION     Family History:  Family History  Problem Relation Age of Onset   Colon polyps Mother    Heart disease Mother    Heart attack Mother    Heart attack Father    Colon cancer Father 8   Heart disease Father    COPD Sister    Emphysema Sister    COPD Brother    Emphysema Brother    Lung cancer Brother    Breast cancer Paternal Grandmother    Heart disease Paternal Grandfather    Colon polyps Paternal Grandfather    Heart disease Other        family history   Parkinson's disease Other    Stomach cancer Neg Hx    Rectal cancer Neg Hx    Esophageal cancer Neg Hx    Family Psychiatric  History: see H&P Social History:  Social History   Substance and Sexual Activity  Alcohol Use Not Currently     Social History   Substance and Sexual Activity  Drug Use No    Social History   Socioeconomic History   Marital status: Divorced    Spouse name: Not on file   Number of children: 2   Years of education: Not on file   Highest education level: Some college, no degree  Occupational History   Occupation: Retired    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  Tobacco Use   Smoking status: Former    Types: Cigarettes    Quit date: 12/19/1986    Years since quitting: 34.9   Smokeless tobacco: Never  Vaping Use   Vaping Use: Never used  Substance and Sexual Activity   Alcohol use: Not Currently   Drug use: No   Sexual activity: Yes    Birth  control/protection: Surgical, Post-menopausal  Other Topics Concern   Not on file  Social History Narrative   Diet:      Caffeine: occasionally iced coffee      Married, if yes what year: Divorced      Do you live in a house, apartment, assisted living, condo, trailer, ect: House      Is it one or more stories: 1      How many persons live in your home? 1      Pets: Dog      Highest level or education completed: Some college      Current/Past profession: Patent examiner      Exercise:  No                Type and how often:          Living Will: Yes   DNR: No and would like to discuss   POA/HPOA: Yes      Functional Status:   Do you have difficulty bathing or dressing yourself? No   Do you have difficulty preparing food or eating? Yes, occasionally   Do you have difficulty managing your medications? Yes   Do you have difficulty managing your finances? Yes ( forget password and double pays   Do you have difficulty affording your medications? No   Social Determinants of Radio broadcast assistant Strain: Low Risk    Difficulty of Paying Living Expenses: Not hard at all  Food Insecurity: No Food Insecurity   Worried About Charity fundraiser in the Last Year: Never true   Arboriculturist in the Last Year: Never true  Transportation Needs: Unknown   Lack of Transportation (Medical): No   Lack of Transportation (Non-Medical): Patient refused  Physical Activity: Unknown   Days of Exercise per Week: 0 days   Minutes of Exercise per Session: Not on file  Stress: Stress Concern Present   Feeling of Stress : Very much  Social Connections: Socially Isolated   Frequency of Communication with Friends and Family: More than three times a week   Frequency of Social Gatherings with Friends and Family: Three times a week   Attends Religious Services: Never   Active Member of Clubs or Organizations: No   Attends Music therapist: Not on file   Marital Status:  Divorced   Additional Social History:                         Sleep: Fair  Appetite:  Fair  Current Medications: Current Facility-Administered Medications  Medication Dose Route Frequency Provider Last Rate Last Admin   acetaminophen (TYLENOL) tablet 500 mg  500 mg Oral Q6H PRN Patrecia Pour, NP   500 mg at 11/16/21 0834   alum & mag hydroxide-simeth (MAALOX/MYLANTA) 200-200-20 MG/5ML suspension 30 mL  30 mL Oral Q4H PRN Patrecia Pour, NP   30 mL at 11/13/21 0834   carbidopa-levodopa (SINEMET IR) 25-100 MG per tablet immediate release 1 tablet  1 tablet Oral TID Patrecia Pour, NP   1 tablet at 11/16/21 0802   clonazePAM (KLONOPIN) tablet 1 mg  1 mg Oral QHS Patrecia Pour, NP   1 mg at 11/15/21 2133   donepezil (ARICEPT) tablet 5 mg  5 mg Oral QHS Patrecia Pour, NP   5 mg at 11/15/21 2133   fluticasone (FLONASE) 50 MCG/ACT nasal spray 1 spray  1 spray Each Nare Daily Rulon Sera, MD   1 spray at 11/16/21 1610   magnesium hydroxide (MILK OF MAGNESIA) suspension 30 mL  30 mL Oral Daily PRN Patrecia Pour, NP   30 mL at 11/11/21 2059   mirtazapine (REMERON) tablet 15 mg  15 mg Oral QHS Parks Ranger, DO   15 mg at 11/15/21 2133   QUEtiapine (SEROQUEL) tablet 150 mg  150 mg Oral QHS Parks Ranger, DO   150 mg at 11/15/21 2135   rOPINIRole (REQUIP) tablet 1 mg  1 mg Oral TID Patrecia Pour, NP   1 mg at 11/16/21 0802   senna-docusate (Senokot-S) tablet 2 tablet  2 tablet Oral QPC breakfast Parks Ranger, DO   2 tablet at 11/16/21 9604   traZODone (DESYREL) tablet 150 mg  150 mg Oral QHS PRN Parks Ranger, DO   150 mg at 11/13/21 2155    Lab Results: No results found for this or any previous visit (from the past 48 hour(s)).  Blood Alcohol level:  Lab Results  Component Value Date   ETH <10 11/08/2021   ETH <10 54/06/8118    Metabolic Disorder Labs: Lab Results  Component Value Date   HGBA1C 4.7 (L) 11/08/2021   MPG 88.19  11/08/2021   No results found for: PROLACTIN Lab Results  Component Value Date   CHOL 153 11/08/2021   TRIG 62 11/08/2021   HDL 54 11/08/2021   CHOLHDL 2.8 11/08/2021   VLDL 12 11/08/2021   LDLCALC 87 11/08/2021   LDLCALC 69 06/20/2020    Physical Findings: AIMS:  , ,  ,  ,    CIWA:    COWS:     Musculoskeletal: Strength & Muscle Tone: within normal limits Gait & Station: normal Patient leans: N/A  Psychiatric Specialty Exam:  Presentation  General Appearance: Appropriate for Environment; Casual  Eye Contact:Fair  Speech:Clear and Coherent; Normal Rate  Speech Volume:Normal  Handedness:No data recorded  Mood and Affect  Mood: euthymic  Affect:Congruent   Thought Process  Thought Processes:Coherent  Descriptions of Associations:Intact  Orientation:Partial  Thought Content:Logical  History of Schizophrenia/Schizoaffective disorder:No  Duration of Psychotic Symptoms:No data recorded Hallucinations:No data recorded Ideas of Reference:None  Suicidal Thoughts:No data recorded Homicidal Thoughts:No data recorded  Sensorium  Memory:Immediate Fair; Recent Fair; Remote Fair  Judgment:Impaired  Insight:Present   Executive Functions  Concentration:Fair  Attention Span:Fair  [place]  Language:Good   Psychomotor Activity  Psychomotor Activity:No data recorded  Assets  Assets:Desire for Improvement; Financial Resources/Insurance; Housing; Social Support   Sleep  Sleep:No data recorded   Physical Exam: Physical Exam ROS Blood pressure 128/72, pulse 90, temperature (!) 97.5 F (36.4 C), temperature source Oral, resp. rate 18, height 5\' 1"  (1.549 m), weight 48.5 kg, SpO2 100 %. Body mass index is 20.22 kg/m.   Treatment Plan Summary: Daily contact with patient to assess and evaluate symptoms and progress in treatment and Medication management  Patient is a 76 year old female with the above-stated past  psychiatric history who is seen in follow-up.  Chart reviewed. Patient discussed with nursing. Patient`s mood remain stable. No safety concerns. No changes in medicines.    Plan:  -continue inpatient psych admission; 15-minute checks; daily contact with patient to assess and evaluate symptoms and progress in treatment; psychoeducation.  -continue scheduled medications:  carbidopa-levodopa  1 tablet Oral TID   clonazePAM  1 mg Oral QHS   donepezil  5 mg Oral QHS   fluticasone  1 spray Each Nare Daily   mirtazapine  15 mg Oral QHS   QUEtiapine  150 mg Oral QHS   rOPINIRole  1 mg Oral TID   senna-docusate  2 tablet Oral QPC breakfast    -continue PRN medications.  acetaminophen, alum & mag hydroxide-simeth, magnesium hydroxide, traZODone   -Disposition: possibly on Monday to [facility]. All necessary aftercare will be arranged prior to discharge.  -  I certify that the patient does need, on a daily basis, active treatment furnished directly by or requiring the supervision of inpatient psychiatric facility personnel.    Larita Fife, MD 11/16/2021, 2:26 PM

## 2021-11-16 NOTE — Plan of Care (Signed)
Patient remain alert and oriented. Reports anxiety and depression a 10/10. Denies SI, HI, AVH. Complains of headache rating 10/10 medicated with PRN tylenol. Ate breakfast among staff and peers in the day room. Continues to complain of light sensitivity. Self isolative to room with minimal interaction with staff and peers. Remain safe on the unit with q15 minute safety checks.  Problem: Education: Goal: Knowledge of [facility] Education information/materials will improve Outcome: Progressing Goal: Emotional status will improve Outcome: Progressing Goal: Mental status will improve Outcome: Progressing Goal: Verbalization of understanding the information provided will improve Outcome: Progressing   Problem: Activity: Goal: Interest or engagement in activities will improve Outcome: Progressing Goal: Sleeping patterns will improve Outcome: Progressing   Problem: Coping: Goal: Ability to verbalize frustrations and anger appropriately will improve Outcome: Progressing Goal: Ability to demonstrate self-control will improve Outcome: Progressing   Problem: Health Behavior/Discharge Planning: Goal: Identification of resources available to assist in meeting health care needs will improve Outcome: Progressing Goal: Compliance with treatment plan for underlying cause of condition will improve Outcome: Progressing   Problem: Physical Regulation: Goal: Ability to maintain clinical measurements within normal limits will improve Outcome: Progressing   Problem: Safety: Goal: Periods of time without injury will increase Outcome: Progressing   Problem: Education: Goal: Ability to incorporate positive changes in behavior to improve self-esteem will improve Outcome: Progressing   Problem: Health Behavior/Discharge Planning: Goal: Ability to identify and utilize available resources and services will improve Outcome: Progressing Goal: Ability to remain free from injury will  improve Outcome: Progressing   Problem: Self-Concept: Goal: Will verbalize positive feelings about self Outcome: Progressing   Problem: Skin Integrity: Goal: Demonstration of wound healing without infection will improve Outcome: Progressing

## 2021-11-16 NOTE — [facility] Suicide Risk Assessment (Signed)
[facility] INPATIENT:  Family/Significant Other Suicide Prevention Education  Suicide Prevention Education:  Contact Attempts: Florene Route (daughter) [phone], has been identified by the patient as the family member/significant other with whom the patient will be residing, and identified as the person(s) who will aid the patient in the event of a mental health crisis.  With written consent from the patient, two attempts were made to provide suicide prevention education, prior to and/or following the patient's discharge.  We were unsuccessful in providing suicide prevention education.  A suicide education pamphlet was given to the patient to share with family/significant other.  Date and time of first attempt: 11/16/2021 12:08PM Date and time of second attempt: A second attempt will be made at a later time.   CSW left HIPAA compliant voicemail requesting return call.   Kenna Gilbert Audine Mangione 11/16/2021, 12:08 PM

## 2021-11-17 DIAGNOSIS — F332 Major depressive disorder, recurrent severe without psychotic features: Secondary | ICD-10-CM | POA: Diagnosis not present

## 2021-11-17 NOTE — Progress Notes (Addendum)
Pt up x 4 with difficulty going sleep and starying asleep. Pt complained about  let leg pain, and frequently for asking for pain' She continues to be confused.

## 2021-11-17 NOTE — Group Note (Signed)
LCSW Group Therapy Note  Group Date: 11/17/2021 Start Time: 3709 End Time: 1415   Type of Therapy and Topic:  Group Therapy - How To Cope with Nervousness about Discharge   Participation Level:  Minimal   Description of Group This process group involved identification of patients' feelings about discharge. Some of them are scheduled to be discharged soon, while others are new admissions, but each of them was asked to share thoughts and feelings surrounding discharge from the hospital. One common theme was that they are excited at the prospect of going home, while another was that many of them are apprehensive about sharing why they were hospitalized. Patients were given the opportunity to discuss these feelings with their peers in preparation for discharge.  Therapeutic Goals  Patient will identify their overall feelings about pending discharge. Patient will think about how they might proactively address issues that they believe will once again arise once they get home (i.e. with parents). Patients will participate in discussion about having hope for change.   Summary of Patient Progress: Patient presented to group and participated in the icebreaker activity. Patient stated that she believes she got some sleep last night and believes the medication she is taking is helpful. Patient shared that she is looking forward to returning to [facility], the assisted living facility she has been residing at. Patient shared that she is looking forward to seeing her dog Barth Kirks. Shortly after the start of group, patient stated that her stomach was hurting and left group early to go lay down.    Therapeutic Modalities Cognitive Behavioral Therapy   Sherilyn Dacosta 11/17/2021  2:51 PM

## 2021-11-17 NOTE — Progress Notes (Signed)
[facility] MD Progress Note  11/17/2021 12:38 PM Shannon Sutton  MRN:  [id number]  Principal Problem: Major depressive disorder, recurrent severe without psychotic features [facility]) Diagnosis: Principal Problem:   Major depressive disorder, recurrent severe without psychotic features [facility])  Patient is a  y.o. female who presents to the geriatric psychiatric unit due from her assisted-living facility due to suspicious unsafe behavior.  Interval History Patient was seen today for re-evaluation.  Nursing reports no events overnight. The patient has no issues with performing ADLs.  Patient has been medication compliant.    Subjective:  On assessment patient appears to be in good spirit today. She reports "I am good". She reports she had a small bowel movements and is happy because she believes she can return back to [place] now. Denies any mood-related complaints. Denies suicidal/homicidal ideations. Denies auditory/visual hallucinations. Reports she is still unsure if having any side effects from medications.    Labs: no new results for review.  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   Recurrent  Dr Trudie Reed 2009 had a bx/MRI: was on MTX and Prednisone   External hemorrhoids    Fibromyalgia with myofascial pain 11/16/2012   Headaches, cluster    History of CT scan of head 2022   History of EKG 2021   History of mammogram 2022   History of MRI 2022   Brain   Hypercholesterolemia 12/15/2013   Hypertension    Insomnia 11/16/2012   Myalgia and myositis, unspecified    Other disorder of muscle, ligament, and fascia    eosinophilic fascitis   Other specified disease of hair and hair  follicles    Pain in joint, multiple sites    Parkinson's disease ([place])    Restless leg syndrome 12/15/2013   Unspecified sleep apnea    Urine incontinence     Past Surgical History:  Procedure Laterality Date   ABDOMINAL HYSTERECTOMY     ABDOMINOPLASTY  2006   "tummy tuck"   BREAST REDUCTION SURGERY  1995   BREAST SURGERY     CATARACT EXTRACTION, BILATERAL     COLONOSCOPY  2018   MUSCLE BIOPSY  2009   DEEP TISSUE   REFRACTIVE SURGERY     TONSILLECTOMY     TOTAL ABDOMINAL HYSTERECTOMY  1999   WISDOM TOOTH EXTRACTION     Family History:  Family History  Problem Relation Age of Onset   Colon polyps Mother    Heart disease Mother    Heart attack Mother    Heart attack Father    Colon cancer Father 21   Heart disease Father    COPD Sister    Emphysema Sister    COPD Brother    Emphysema Brother    Lung cancer Brother    Breast cancer Paternal Grandmother    Heart disease Paternal Grandfather    Colon polyps Paternal Grandfather    Heart disease Other        family history   Parkinson's disease Other    Stomach cancer Neg Hx    Rectal cancer Neg Hx    Esophageal cancer Neg Hx    Family Psychiatric  History: see H&P Social History:  Social History   Substance and Sexual Activity  Alcohol Use Not Currently     Social History   Substance and Sexual Activity  Drug Use No    Social History   Socioeconomic History   Marital status: Divorced    Spouse name: Not on file   Number of children: 2   Years of education: Not on file   Highest education level: Some college, no degree  Occupational History   Occupation: Retired    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  Tobacco Use   Smoking status: Former    Types: Cigarettes    Quit date: 12/19/1986    Years since quitting: 34.9   Smokeless tobacco: Never  Vaping Use   Vaping Use: Never used  Substance and Sexual Activity   Alcohol use: Not Currently   Drug use: No   Sexual activity: Yes     Birth control/protection: Surgical, Post-menopausal  Other Topics Concern   Not on file  Social History Narrative   Diet:      Caffeine: occasionally iced coffee      Married, if yes what year: Divorced      Do you live in a house, apartment, assisted living, condo, trailer, ect: House      Is it one or more stories: 1      How many persons live in your home? 1      Pets: Dog      Highest level or education completed: Some college      Current/Past profession: Patent examiner      Exercise:  No                Type and how often:          Living Will: Yes   DNR: No and would like to discuss   POA/HPOA: Yes      Functional Status:   Do you have difficulty bathing or dressing yourself? No   Do you have difficulty preparing food or eating? Yes, occasionally   Do you have difficulty managing your medications? Yes   Do you have difficulty managing your finances? Yes ( forget password and double pays   Do you have difficulty affording your medications? No   Social Determinants of Radio broadcast assistant Strain: Low Risk    Difficulty of Paying Living Expenses: Not hard at all  Food Insecurity: No Food Insecurity   Worried About Charity fundraiser in the Last Year: Never true   Arboriculturist in the Last Year: Never true  Transportation Needs: Unknown   Lack of Transportation (Medical): No   Lack of Transportation (Non-Medical): Patient refused  Physical Activity: Unknown   Days of Exercise per Week: 0 days   Minutes of Exercise per Session: Not on file  Stress: Stress Concern Present   Feeling of Stress : Very much  Social Connections: Socially Isolated   Frequency of Communication with Friends and Family: More than three times a week   Frequency of Social Gatherings with Friends and Family: Three times a week   Attends Religious Services: Never   Active Member of Clubs or Organizations: No   Attends Music therapist: Not on file   Marital  Status: Divorced   Additional Social History:  Sleep: Fair  Appetite:  Fair  Current Medications: Current Facility-Administered Medications  Medication Dose Route Frequency Provider Last Rate Last Admin   acetaminophen (TYLENOL) tablet 500 mg  500 mg Oral Q6H PRN Patrecia Pour, NP   500 mg at 11/16/21 0834   alum & mag hydroxide-simeth (MAALOX/MYLANTA) 200-200-20 MG/5ML suspension 30 mL  30 mL Oral Q4H PRN Patrecia Pour, NP   30 mL at 11/13/21 0834   carbidopa-levodopa (SINEMET IR) 25-100 MG per tablet immediate release 1 tablet  1 tablet Oral TID Patrecia Pour, NP   1 tablet at 11/17/21 0901   clonazePAM (KLONOPIN) tablet 1 mg  1 mg Oral QHS Patrecia Pour, NP   1 mg at 11/16/21 2142   donepezil (ARICEPT) tablet 5 mg  5 mg Oral QHS Patrecia Pour, NP   5 mg at 11/16/21 2140   fluticasone (FLONASE) 50 MCG/ACT nasal spray 1 spray  1 spray Each Nare Daily Rulon Sera, MD   1 spray at 11/17/21 0901   magnesium hydroxide (MILK OF MAGNESIA) suspension 30 mL  30 mL Oral Daily PRN Patrecia Pour, NP   30 mL at 11/11/21 2059   mirtazapine (REMERON) tablet 15 mg  15 mg Oral QHS Parks Ranger, DO   15 mg at 11/16/21 2141   QUEtiapine (SEROQUEL) tablet 150 mg  150 mg Oral QHS Parks Ranger, DO   150 mg at 11/16/21 2140   rOPINIRole (REQUIP) tablet 1 mg  1 mg Oral TID Patrecia Pour, NP   1 mg at 11/17/21 8127   senna-docusate (Senokot-S) tablet 2 tablet  2 tablet Oral QPC breakfast Parks Ranger, DO   2 tablet at 11/17/21 0902   traZODone (DESYREL) tablet 150 mg  150 mg Oral QHS PRN Parks Ranger, DO   150 mg at 11/16/21 2137    Lab Results: No results found for this or any previous visit (from the past 48 hour(s)).  Blood Alcohol level:  Lab Results  Component Value Date   ETH <10 11/08/2021   ETH <10 51/70/0174    Metabolic Disorder Labs: Lab Results  Component Value Date   HGBA1C 4.7 (L) 11/08/2021    MPG 88.19 11/08/2021   No results found for: PROLACTIN Lab Results  Component Value Date   CHOL 153 11/08/2021   TRIG 62 11/08/2021   HDL 54 11/08/2021   CHOLHDL 2.8 11/08/2021   VLDL 12 11/08/2021   LDLCALC 87 11/08/2021   LDLCALC 69 06/20/2020    Physical Findings: AIMS:  , ,  ,  ,    CIWA:    COWS:     Musculoskeletal: Strength & Muscle Tone: within normal limits Gait & Station: normal Patient leans: N/A  Psychiatric Specialty Exam:  Presentation  General Appearance: Appropriate for Environment; Casual  Eye Contact:Fair  Speech:Clear and Coherent; Normal Rate  Speech Volume:Normal  Handedness:No data recorded  Mood and Affect  Mood: euthymic  Affect:Congruent   Thought Process  Thought Processes:Coherent  Descriptions of Associations:Intact  Orientation:Partial  Thought Content:Logical  History of Schizophrenia/Schizoaffective disorder:No  Duration of Psychotic Symptoms:No data recorded Hallucinations:No data recorded Ideas of Reference:None  Suicidal Thoughts:No data recorded Homicidal Thoughts:No data recorded  Sensorium  Memory:Immediate Fair; Recent Fair; Remote Fair  Judgment:Impaired  Insight:Present   Executive Functions  Concentration:Fair  Attention Span:Fair  [place]  Language:Good   Psychomotor Activity  Psychomotor Activity:No data recorded  Assets  Assets:Desire for Improvement; Financial Resources/Insurance;  Housing; Social Support   Sleep  Sleep:No data recorded   Physical Exam: Physical Exam ROS Blood pressure (!) 148/72, pulse (!) 110, temperature 97.9 F (36.6 C), temperature source Oral, resp. rate 20, height 5\' 1"  (1.549 m), weight 48.5 kg, SpO2 100 %. Body mass index is 20.22 kg/m.   Treatment Plan Summary: Daily contact with patient to assess and evaluate symptoms and progress in treatment and Medication management  Patient is a 76 year old female with the  above-stated past psychiatric history who is seen in follow-up.  Chart reviewed. Patient discussed with nursing. Patient`s mood remain stable. No safety concerns. No changes in medicines.    Plan:  -continue inpatient psych admission; 15-minute checks; daily contact with patient to assess and evaluate symptoms and progress in treatment; psychoeducation.  -continue scheduled medications:  carbidopa-levodopa  1 tablet Oral TID   clonazePAM  1 mg Oral QHS   donepezil  5 mg Oral QHS   fluticasone  1 spray Each Nare Daily   mirtazapine  15 mg Oral QHS   QUEtiapine  150 mg Oral QHS   rOPINIRole  1 mg Oral TID   senna-docusate  2 tablet Oral QPC breakfast    -continue PRN medications.  acetaminophen, alum & mag hydroxide-simeth, magnesium hydroxide, traZODone   -Disposition: possibly on Monday to [place]. All necessary aftercare will be arranged prior to discharge.  -  I certify that the patient does need, on a daily basis, active treatment furnished directly by or requiring the supervision of inpatient psychiatric facility personnel.    Larita Fife, MD 11/17/2021, 12:38 PM

## 2021-11-17 NOTE — Progress Notes (Signed)
Pt visible on the unit, she had visit with her daughter and the visit went well. Pt frequently ate the nursing station asking for medication, even before she needs it. She is med seeking. She denies SI/HI and AVH. Pt has short and long term memory loss. She reports her constipation is resolving and she does not have any bowel incontinence. She continues to have difficultly falling asleep and staying asleep.Her pulse is remains elevated 108, but she is asymptomatic.

## 2021-11-17 NOTE — Progress Notes (Signed)
The patient was pleasant on approach, she interacted well with peers and staff on the unit. She was cooperative with treatment and medication regime. She does come to the nurses's station with frequent request of ice and water. She reports feeling nauseous from eating too much during breakfast and lunch , so she refused dinner but current writer encouraged her to keep pushing fluids when she could. She has been visible back and forth to the dayroom during most of shift.

## 2021-11-17 NOTE — Progress Notes (Signed)
Pt denies SI?HI and AVH. Minimal interaction with peers or staff. Pt frequently at the nursing station, stating she cannot sleep and that she was tired.  Pt demanding what she wants right away, no questions asked. Pt frequently at the nursing station asking questions about "what's going on and wanting to knowing what the staff was doing." She was also telling the RN what to do and how she wanted it done. Pt needs lots of support about her concerns. She is often confused, with memory loss. She could not remember why she was admitted.

## 2021-11-18 DIAGNOSIS — F332 Major depressive disorder, recurrent severe without psychotic features: Secondary | ICD-10-CM | POA: Diagnosis not present

## 2021-11-18 MED ORDER — ENSURE ENLIVE PO LIQD
237.0000 mL | Freq: Two times a day (BID) | ORAL | Status: DC
  Administered 2021-11-18 – 2021-11-19 (×4): 237 mL via ORAL

## 2021-11-18 MED ORDER — CLONAZEPAM 0.5 MG PO TABS
0.5000 mg | ORAL_TABLET | Freq: Every day | ORAL | Status: DC
  Administered 2021-11-18: 0.5 mg via ORAL
  Filled 2021-11-18: qty 1

## 2021-11-18 NOTE — Progress Notes (Signed)
Pt presents pleasant, denied SI/HI/AVH or self harm thoughts or intent. She was intrusive with other peers visitors as she was noted sitting with them during visitation. She became anxious during medication time asking for "everything I can get to go to sleep because I don't sleep well at night."  She ate her snacks and was med compliant. Ambulates with a steady gait with no falls or unsafe behavior noted thus far. Q15 min observations maintained for safety and support provided as needed.

## 2021-11-18 NOTE — Progress Notes (Signed)
[facility] MD Progress Note  11/18/2021 10:45 AM Shannon Sutton  MRN:  [id number] Subjective: Shannon Sutton continues to complain that she does not sleep even though she is taking Klonopin, trazodone, Seroquel, and Remeron.  Part of the problem is she sleeps in the daytime and she does not need any more medication.  Her mood has improved.  She is okay with discharge back to her many tomorrow.  She denies any suicidal or homicidal ideation.  She denies any auditory or visual hallucinations.  Social work is working on getting her back to [employer]  Principal Problem: Major depressive disorder, recurrent severe without psychotic features ([place]) Diagnosis: Principal Problem:   Major depressive disorder, recurrent severe without psychotic features ([place])  Total Time spent with patient: 15 minutes  Past Psychiatric History: Dr. Casimiro Needle  Past Medical History:  Past Medical History:  Diagnosis Date   Adenomatous colon polyp 1990   Allergy    Anxiety 12/15/2013   Arthritis    Cataract    Chronic constipation    Depression 11/16/2012   Diverticulitis    Diverticulosis    Eosinophilic fasciitis 40/98/1191   Recurrent  Dr Trudie Reed 2009 had a bx/MRI: was on MTX and Prednisone   External hemorrhoids    Fibromyalgia with myofascial pain 11/16/2012   Headaches, cluster    History of CT scan of head 2022   History of EKG 2021   History of mammogram 2022   History of MRI 2022   Brain   Hypercholesterolemia 12/15/2013   Hypertension    Insomnia 11/16/2012   Myalgia and myositis, unspecified    Other disorder of muscle, ligament, and fascia    eosinophilic fascitis   Other specified disease of hair and hair follicles    Pain in joint, multiple sites    Parkinson's disease ([place])    Restless leg syndrome 12/15/2013   Unspecified sleep apnea    Urine incontinence     Past Surgical History:  Procedure Laterality Date   ABDOMINAL HYSTERECTOMY     ABDOMINOPLASTY  2006   "tummy tuck"   BREAST REDUCTION SURGERY   1995   BREAST SURGERY     CATARACT EXTRACTION, BILATERAL     COLONOSCOPY  2018   MUSCLE BIOPSY  2009   DEEP TISSUE   REFRACTIVE SURGERY     TONSILLECTOMY     TOTAL ABDOMINAL HYSTERECTOMY  1999   WISDOM TOOTH EXTRACTION     Family History:  Family History  Problem Relation Age of Onset   Colon polyps Mother    Heart disease Mother    Heart attack Mother    Heart attack Father    Colon cancer Father 18   Heart disease Father    COPD Sister    Emphysema Sister    COPD Brother    Emphysema Brother    Lung cancer Brother    Breast cancer Paternal Grandmother    Heart disease Paternal Grandfather    Colon polyps Paternal Grandfather    Heart disease Other        family history   Parkinson's disease Other    Stomach cancer Neg Hx    Rectal cancer Neg Hx    Esophageal cancer Neg Hx     Social History:  Social History   Substance and Sexual Activity  Alcohol Use Not Currently     Social History   Substance and Sexual Activity  Drug Use No    Social History   Socioeconomic History   Marital status:  Divorced    Spouse name: Not on file   Number of children: 2   Years of education: Not on file   Highest education level: Some college, no degree  Occupational History   Occupation: Retired    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  Tobacco Use   Smoking status: Former    Types: Cigarettes    Quit date: 12/19/1986    Years since quitting: 34.9   Smokeless tobacco: Never  Vaping Use   Vaping Use: Never used  Substance and Sexual Activity   Alcohol use: Not Currently   Drug use: No   Sexual activity: Yes    Birth control/protection: Surgical, Post-menopausal  Other Topics Concern   Not on file  Social History Narrative   Diet:      Caffeine: occasionally iced coffee      Married, if yes what year: Divorced      Do you live in a house, apartment, assisted living, condo, trailer, ect: House      Is it one or more stories: 1      How many  persons live in your home? 1      Pets: Dog      Highest level or education completed: Some college      Current/Past profession: Patent examiner      Exercise:  No                Type and how often:          Living Will: Yes   DNR: No and would like to discuss   POA/HPOA: Yes      Functional Status:   Do you have difficulty bathing or dressing yourself? No   Do you have difficulty preparing food or eating? Yes, occasionally   Do you have difficulty managing your medications? Yes   Do you have difficulty managing your finances? Yes ( forget password and double pays   Do you have difficulty affording your medications? No   Social Determinants of Radio broadcast assistant Strain: Low Risk    Difficulty of Paying Living Expenses: Not hard at all  Food Insecurity: No Food Insecurity   Worried About Charity fundraiser in the Last Year: Never true   Arboriculturist in the Last Year: Never true  Transportation Needs: Unknown   Lack of Transportation (Medical): No   Lack of Transportation (Non-Medical): Patient refused  Physical Activity: Unknown   Days of Exercise per Week: 0 days   Minutes of Exercise per Session: Not on file  Stress: Stress Concern Present   Feeling of Stress : Very much  Social Connections: Socially Isolated   Frequency of Communication with Friends and Family: More than three times a week   Frequency of Social Gatherings with Friends and Family: Three times a week   Attends Religious Services: Never   Active Member of Clubs or Organizations: No   Attends Music therapist: Not on file   Marital Status: Divorced   Additional Social History:                         Sleep: Poor  Appetite:  Poor  Current Medications: Current Facility-Administered Medications  Medication Dose Route Frequency Provider Last Rate Last Admin   acetaminophen (TYLENOL) tablet 500 mg  500 mg Oral Q6H PRN Patrecia Pour, NP   500 mg at 11/16/21  [phone]  alum & mag hydroxide-simeth (MAALOX/MYLANTA) 200-200-20 MG/5ML suspension 30 mL  30 mL Oral Q4H PRN Patrecia Pour, NP   30 mL at 11/17/21 1426   carbidopa-levodopa (SINEMET IR) 25-100 MG per tablet immediate release 1 tablet  1 tablet Oral TID Patrecia Pour, NP   1 tablet at 11/18/21 8315   clonazePAM (KLONOPIN) tablet 0.5 mg  0.5 mg Oral QHS Parks Ranger, DO       donepezil (ARICEPT) tablet 5 mg  5 mg Oral QHS Patrecia Pour, NP   5 mg at 11/17/21 2127   fluticasone (FLONASE) 50 MCG/ACT nasal spray 1 spray  1 spray Each Nare Daily Rulon Sera, MD   1 spray at 11/17/21 2228   magnesium hydroxide (MILK OF MAGNESIA) suspension 30 mL  30 mL Oral Daily PRN Patrecia Pour, NP   30 mL at 11/11/21 2059   mirtazapine (REMERON) tablet 15 mg  15 mg Oral QHS Parks Ranger, DO   15 mg at 11/17/21 2127   QUEtiapine (SEROQUEL) tablet 150 mg  150 mg Oral QHS Parks Ranger, DO   150 mg at 11/17/21 2126   rOPINIRole (REQUIP) tablet 1 mg  1 mg Oral TID Patrecia Pour, NP   1 mg at 11/18/21 1761   senna-docusate (Senokot-S) tablet 2 tablet  2 tablet Oral QPC breakfast Parks Ranger, DO   2 tablet at 11/18/21 6073   traZODone (DESYREL) tablet 150 mg  150 mg Oral QHS PRN Parks Ranger, DO   150 mg at 11/17/21 2226    Lab Results: No results found for this or any previous visit (from the past 76 hour(s)).  Blood Alcohol level:  Lab Results  Component Value Date   ETH <10 11/08/2021   ETH <10 71/03/2693    Metabolic Disorder Labs: Lab Results  Component Value Date   HGBA1C 4.7 (L) 11/08/2021   MPG 88.19 11/08/2021   No results found for: PROLACTIN Lab Results  Component Value Date   CHOL 153 11/08/2021   TRIG 62 11/08/2021   HDL 54 11/08/2021   CHOLHDL 2.8 11/08/2021   VLDL 12 11/08/2021   LDLCALC 87 11/08/2021   LDLCALC 69 06/20/2020    Physical Findings: AIMS:  , ,  ,  ,    CIWA:    COWS:     Musculoskeletal: Strength &  Muscle Tone: within normal limits Gait & Station: normal Patient leans: N/A  Psychiatric Specialty Exam:  Presentation  General Appearance: Appropriate for Environment; Casual  Eye Contact:Fair  Speech:Clear and Coherent; Normal Rate  Speech Volume:Normal  Handedness:No data recorded  Mood and Affect  Mood:Depressed; Anxious  Affect:Congruent   Thought Process  Thought Processes:Coherent  Descriptions of Associations:Intact  Orientation:Partial  Thought Content:Logical  History of Schizophrenia/Schizoaffective disorder:No  Duration of Psychotic Symptoms:No data recorded Hallucinations:No data recorded Ideas of Reference:None  Suicidal Thoughts:No data recorded Homicidal Thoughts:No data recorded  Sensorium  Memory:Immediate Fair; Recent Fair; Remote Fair  Judgment:Impaired  Insight:Present   Executive Functions  Concentration:Fair  Attention Span:Fair  [place]  Language:Good   Psychomotor Activity  Psychomotor Activity:No data recorded  Assets  Assets:Desire for Improvement; Financial Resources/Insurance; Housing; Social Support   Sleep  Sleep:No data recorded   Physical Exam: Physical Exam Vitals and nursing note reviewed.  Constitutional:      Appearance: Normal appearance. She is normal weight.  Neurological:     General: No focal deficit present.     Mental  Status: She is alert and oriented to person, place, and time.  Psychiatric:        Attention and Perception: Attention and perception normal.        Mood and Affect: Mood is anxious and depressed.        Speech: Speech is tangential.        Behavior: Behavior is cooperative.        Thought Content: Thought content is paranoid.        Cognition and Memory: Cognition and memory normal.        Judgment: Judgment is impulsive.   Review of Systems  Constitutional: Negative.   HENT: Negative.    Eyes: Negative.   Respiratory: Negative.     Cardiovascular: Negative.   Gastrointestinal: Negative.   Genitourinary: Negative.   Musculoskeletal: Negative.   Skin: Negative.   Neurological: Negative.   Endo/Heme/Allergies: Negative.   Psychiatric/Behavioral:  Positive for depression. The patient has insomnia.   Blood pressure (!) 149/77, pulse (!) 109, temperature 98.4 F (36.9 C), temperature source Oral, resp. rate 20, height 5\' 1"  (1.549 m), weight 48.5 kg, SpO2 100 %. Body mass index is 20.22 kg/m.   Treatment Plan Summary: Daily contact with patient to assess and evaluate symptoms and progress in treatment, Medication management, and Plan start ensured for poor appetite.  Possible discharge tomorrow.  Start weaning Klonopin.  Bier, DO 11/18/2021, 10:45 AM

## 2021-11-18 NOTE — B[facility] Counselor (Signed)
CSW spoke with pt's coordinator, Ok Anis, [phone] at [facility], regarding a potential discharge for Tuesday.  She said pt is welcome to come back and that she would need an updated FL2 faxed over regarding pt care.  CSW will complete FL2 and fax over to be prepared for discharge.   Macoy Rodwell [place], MSW, LCSW-A 2/6/20231:27 PM

## 2021-11-18 NOTE — Progress Notes (Signed)
Pt frequently awake when checked throughout the night, She got trazodone HS to help her insomnia. She is verbally intrusive and loud at times when her needs are not met quickly. She only came out of her room x 1 around 1030 p asking if her medication was going to working. She wants to stop taking the klonopin 1 mg at HS.

## 2021-11-18 NOTE — Progress Notes (Signed)
Pt presents restless, fidgety, animated with fair eye contact and is intrusive on interactions. Denies SI, HI, AVH, pain and paranoia thus far this shift. Reports racing thoughts with difficulty concentrating and sleeping especially at night "It's hard to focus because my thoughts are usually jumpy in my head at night. I can't sleep good at night either". Reports fair appetite. Visible in dayroom with peers at intervals this shift. Engaged in activities as scheduled with peers when prompted. Emotional support and encouragement provided to pt. Safety checks maintained at Q 15 minutes intervals without self harm gestures or outburst. All medications administered as ordered with verbal education and effects monitored. Pt verbalized agreement with treatment team to be taken off scheduled HS Klonopin 1 mg PO as initiated by her daughter. Klonopin tapering initiated by provider this shift. Pt started on Ensure supplement to increase calorie intake; tolerated well. Noted on phone with family. Remains verbally redirectable at this time.

## 2021-11-18 NOTE — Progress Notes (Signed)
Recreation Therapy Notes  Date: 11/18/2021  Time: 1:25 PM    Location: Courtyard     Behavioral response: N/A   Intervention Topic: Leisure   Discussion/Intervention: Patient refused to attend group.   Clinical Observations/Feedback:  Patient refused to attend group.   Cerena Baine LRT/CTRS          Peityn Payton 11/18/2021 3:45 PM

## 2021-11-18 NOTE — BHH Counselor (Signed)
CSW spoke with pt's daughter, Florene Route [phone] regarding pt discharge Tuesday.  She stated she could provide transportation for pt,  however it would be late afternoon, end of day due to work obligations.   CSW also provided information regarding follow up referrals for therapy for pt who requested that daughter and care coordinator at [facility] to assist in making an appointment for pt. She stated she would be fine with assisting her mother in making an appointment.   No other requests were made. Conversation ended without incident.   Jakeob Tullis [place], MSW, LCSW-A 2/6/20234:10 PM

## 2021-11-18 NOTE — [place] FL2 (Addendum)
[place] MEDICAID [id number] LEVEL OF CARE SCREENING TOOL     IDENTIFICATION  Patient Name: Shannon Sutton Birthdate: 02-04-1946 Sex: female Admission Date (Current Location): 11/10/2021  [facility] and [place] Number:  Chiropodist and Address:  [facility], [address], [place], [place] [address]      Provider Number: [id number]  Attending Physician Name and Address:  Reggie Pile, MD  Relative Name and Phone Number:  Brett Canales (Daughter)   [phone]    Current Level of Care: Hospital Recommended Level of Care: Assisted Living Facility Prior Approval Number:    Date Approved/Denied:   PASRR Number:    Discharge Plan: Other (Comment) (Assisted Living Facility ([facility]))    Current Diagnoses: Patient Active Problem List   Diagnosis Date Noted   Major depressive disorder, recurrent severe without psychotic features (HCC) 11/09/2021   Ataxia 11/23/2017   Generalized headaches 08/30/2014   Restless leg syndrome 12/15/2013    Orientation RESPIRATION BLADDER Height & Weight     Self, Time, Situation, Place  Normal Incontinent Weight: 107 lb (48.5 kg) Height:  5\' 1"  (154.9 cm)  BEHAVIORAL SYMPTOMS/MOOD NEUROLOGICAL BOWEL NUTRITION STATUS   (N/A)   Continent  (Regualr diet with ensure)  AMBULATORY STATUS COMMUNICATION OF NEEDS Skin   Independent Verbally Normal                       Personal Care Assistance Level of Assistance   (N/A)           Functional Limitations Info  Sight Sight Info: Impaired (light sensitivity)        SPECIAL CARE FACTORS FREQUENCY                       Contractures Contractures Info: Not present    Additional Factors Info  Code Status Code Status Info: FULL             Current Medications (11/18/2021):  This is the current hospital active medication list Current Facility-Administered Medications  Medication Dose Route Frequency Provider Last Rate Last Admin    acetaminophen (TYLENOL) tablet 500 mg  500 mg Oral Q6H PRN Charm Rings, NP   500 mg at 11/16/21 0834   alum & mag hydroxide-simeth (MAALOX/MYLANTA) 200-200-20 MG/5ML suspension 30 mL  30 mL Oral Q4H PRN Charm Rings, NP   30 mL at 11/17/21 1426   carbidopa-levodopa (SINEMET IR) 25-100 MG per tablet immediate release 1 tablet  1 tablet Oral TID Charm Rings, NP   1 tablet at 11/18/21 5621   clonazePAM (KLONOPIN) tablet 0.5 mg  0.5 mg Oral QHS [name], DO       donepezil (ARICEPT) tablet 5 mg  5 mg Oral QHS Charm Rings, NP   5 mg at 11/17/21 2127   feeding supplement (ENSURE ENLIVE / ENSURE PLUS) liquid 237 mL  237 mL Oral BID BM [name], DO   237 mL at 11/18/21 1147   fluticasone (FLONASE) 50 MCG/ACT nasal spray 1 spray  1 spray Each Nare Daily Reggie Pile, MD   1 spray at 11/18/21 1016   magnesium hydroxide (MILK OF MAGNESIA) suspension 30 mL  30 mL Oral Daily PRN Charm Rings, NP   30 mL at 11/11/21 2059   mirtazapine (REMERON) tablet 15 mg  15 mg Oral QHS [name], DO   15 mg at 11/17/21 2127   QUEtiapine (SEROQUEL) tablet  150 mg  150 mg Oral QHS [name], DO   150 mg at 11/17/21 2126   rOPINIRole (REQUIP) tablet 1 mg  1 mg Oral TID Charm Rings, NP   1 mg at 11/18/21 1610   senna-docusate (Senokot-S) tablet 2 tablet  2 tablet Oral QPC breakfast [name], DO   2 tablet at 11/18/21 9604   traZODone (DESYREL) tablet 150 mg  150 mg Oral QHS PRN [name], DO   150 mg at 11/17/21 2226     Discharge Medications: Please see discharge summary for a list of discharge medications.  Relevant Imaging Results:  Relevant Lab Results:   Additional Information Parkinson's Disease  Jewell Haught A [place], LCSWA

## 2021-11-19 DIAGNOSIS — F332 Major depressive disorder, recurrent severe without psychotic features: Secondary | ICD-10-CM | POA: Diagnosis not present

## 2021-11-19 MED ORDER — MIRTAZAPINE 15 MG PO TABS: 15.0000 mg | ORAL_TABLET | Freq: Every day | ORAL | 2 refills | Status: DC

## 2021-11-19 MED ORDER — QUETIAPINE FUMARATE 100 MG PO TABS: 150.0000 mg | ORAL_TABLET | Freq: Every day | ORAL | 2 refills | Status: DC

## 2021-11-19 MED ORDER — TRAZODONE HCL 150 MG PO TABS: 150.0000 mg | ORAL_TABLET | Freq: Every evening | ORAL | 2 refills | Status: DC | PRN

## 2021-11-19 NOTE — Progress Notes (Signed)
°  [facility] Adult Case Management Discharge Plan :  Will you be returning to the same living situation after discharge:  Yes,  pt is returning to her assisted living facility At discharge, do you have transportation home?: Yes,  pt's daughter is providing transportation Do you have the ability to pay for your medications: Yes,  pt has [employer]  Release of information consent forms completed and in the chart;  Patient's signature needed at discharge.  Patient to Follow up at:  Follow-up Information     BEHAVIORAL HEALTH OUTPATIENT THERAPY [place] Follow up on 12/04/2021.   Specialty: Behavioral Health Why: You have an appointment scheduled with Dr. Casimiro Needle on Wednesday February 22nd at 4:30pm. (virtual). Thanks! Contact information: [place] [id number] [place] [phone]                Next level of care provider has access to [facility] and Suicide Prevention discussed: Yes,  completed with pt's daughter     Has patient been referred to the Quitline?: Patient refused referral  Patient has been referred for addiction treatment: N/A  Jaxyn Rout A [place], [place] 11/19/2021, 11:12 AM

## 2021-11-19 NOTE — Progress Notes (Signed)
Recreation Therapy Notes   Date: 11/19/2021  Time: 1:20 pm    Location: Craft room   Behavioral response: N/A   Intervention Topic: Goals    Discussion/Intervention: Patient refused to attend group.   Clinical Observations/Feedback:  Patient refused to attend group.   Tanique Matney LRT/CTRS        Eliya Bubar 11/19/2021 3:15 PM

## 2021-11-19 NOTE — Discharge Summary (Signed)
Physician Discharge Summary Note  Patient:  Shannon Sutton is an 76 y.o., female MRN:  [id number] DOB:  [birth year] Patient phone:  [phone] (home)  Patient address:   [address] [place] [address],  Total Time spent with patient: 1 hour  Date of Admission:  11/10/2021 Date of Discharge: 11/19/2021  Reason for Admission:  Patient is 76 year old divorced, retired, mother of 2 Caucasian female residing at [facility] assisted living facility was urgently brought to [facility] emergency department on November 07, 2021 for evaluation of suicide due to lacerations on her chest wall and midline neck.  Patient had denied thoughts of suicide and had reported various accounts how this had occurred.  Therefore she was placed on IVC and transferred to [facility] for further evaluation and care.  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   Recurrent  Dr Nickola Major 2009 had a bx/MRI: was on MTX and Prednisone   External hemorrhoids    Fibromyalgia with myofascial pain 11/16/2012   Headaches, cluster    History of CT scan of head 2022   History of EKG 2021   History of mammogram 2022   History of MRI 2022   Brain   Hypercholesterolemia 12/15/2013   Hypertension    Insomnia 11/16/2012   Myalgia and myositis, unspecified    Other disorder of muscle, ligament, and fascia    eosinophilic fascitis   Other specified disease of hair and hair follicles    Pain in joint, multiple sites     Parkinson's disease (HCC)    Restless leg syndrome 12/15/2013   Unspecified sleep apnea    Urine incontinence     Past Surgical History:  Procedure Laterality Date   ABDOMINAL HYSTERECTOMY     ABDOMINOPLASTY  2006   "tummy tuck"   BREAST REDUCTION SURGERY  1995   BREAST SURGERY     CATARACT EXTRACTION, BILATERAL     COLONOSCOPY  2018   MUSCLE BIOPSY  2009   DEEP TISSUE   REFRACTIVE SURGERY     TONSILLECTOMY     TOTAL ABDOMINAL HYSTERECTOMY  1999   WISDOM TOOTH EXTRACTION     Family History:  Family History  Problem Relation Age of Onset   Colon polyps Mother    Heart disease Mother    Heart attack Mother    Heart attack Father    Colon cancer Father 36   Heart disease Father    COPD Sister    Emphysema Sister    COPD Brother    Emphysema Brother    Lung cancer Brother    Breast cancer Paternal Grandmother    Heart disease Paternal Grandfather    Colon polyps Paternal Grandfather    Heart disease Other        family history   Parkinson's disease Other    Stomach cancer Neg Hx    Rectal cancer Neg Hx    Esophageal cancer Neg Hx     Social History:  Social History   Substance and Sexual Activity  Alcohol Use Not Currently     Social History   Substance and Sexual Activity  Drug Use No    Social History   Socioeconomic History   Marital status: Divorced    Spouse name: Not on file   Number of children: 2   Years of education: Not on file   Highest education level: Some college, no degree  Occupational History   Occupation: Retired    Associate Professor: RETIRED    Comment: Development worker, community  Tobacco Use   Smoking status: Former    Types: Cigarettes    Quit date: 12/19/1986    Years since quitting: 34.9   Smokeless tobacco: Never  Vaping Use   Vaping Use: Never used  Substance and Sexual Activity   Alcohol use: Not Currently   Drug use: No   Sexual activity: Yes    Birth control/protection: Surgical, Post-menopausal  Other Topics Concern   Not on  file  Social History Narrative   Diet:      Caffeine: occasionally iced coffee      Married, if yes what year: Divorced      Do you live in a house, apartment, assisted living, condo, trailer, ect: House      Is it one or more stories: 1      How many persons live in your home? 1      Pets: Dog      Highest level or education completed: Some college      Current/Past profession: Development worker, community      Exercise:  No                Type and how often:          Living Will: Yes   DNR: No and would like to discuss   POA/HPOA: Yes      Functional Status:   Do you have difficulty bathing or dressing yourself? No   Do you have difficulty preparing food or eating? Yes, occasionally   Do you have difficulty managing your medications? Yes   Do you have difficulty managing your finances? Yes ( forget password and double pays   Do you have difficulty affording your medications? No   Social Determinants of Corporate investment banker Strain: Low Risk    Difficulty of Paying Living Expenses: Not hard at all  Food Insecurity: No Food Insecurity   Worried About Programme researcher, broadcasting/film/video in the Last Year: Never true   Barista in the Last Year: Never true  Transportation Needs: Unknown   Lack of Transportation (Medical): No   Lack of Transportation (Non-Medical): Patient refused  Physical Activity: Unknown   Days of Exercise per Week: 0 days   Minutes of Exercise per Session: Not on file  Stress: Stress Concern Present   Feeling of Stress : Very much  Social Connections: Socially Isolated   Frequency of Communication with Friends and Family: More than three times a week   Frequency of Social Gatherings with Friends and Family: Three times a week   Attends Religious Services: Never   Active Member of Clubs or Organizations: No   Attends Banker Meetings: Not on file   Marital Status: Divorced    Hospital Course: Shannon Sutton was admitted involuntarily under routine  orders and precautions.  She was started on Remeron and Seroquel.  She did well with the medications but continued to complain that she was not sleeping.  She denied that she was  suicidal or that her cuts were suicide attempt.  I think some of her problems sleeping was that she was in bed most of the day.  It was felt that she maximized hospitalization she was discharged back to [facility] assisted living.  On the day of discharge she denied suicidal ideation homicidal ideation auditory or visual hallucinations.  Her judgment and insight were good.  Physical Findings: AIMS: Facial and Oral Movements Muscles of Facial Expression: None, normal Lips and Perioral Area: None, normal Jaw: None, normal Tongue: None, normal,Extremity Movements Upper (arms, wrists, hands, fingers): None, normal Lower (legs, knees, ankles, toes): None, normal, Trunk Movements Neck, shoulders, hips: None, normal, Overall Severity Severity of abnormal movements (highest score from questions above): None, normal Incapacitation due to abnormal movements: None, normal Patient's awareness of abnormal movements (rate only patient's report): No Awareness, Dental Status Current problems with teeth and/or dentures?: No Does patient usually wear dentures?: No  CIWA:    COWS:     Musculoskeletal: Strength & Muscle Tone: within normal limits Gait & Station: normal Patient leans: N/A   Psychiatric Specialty Exam:  Presentation  General Appearance: Appropriate for Environment; Casual  Eye Contact:Fair  Speech:Clear and Coherent; Normal Rate  Speech Volume:Normal  Handedness:No data recorded  Mood and Affect  Mood:Depressed; Anxious  Affect:Congruent   Thought Process  Thought Processes:Coherent  Descriptions of Associations:Intact  Orientation:Partial  Thought Content:Logical  History of Schizophrenia/Schizoaffective disorder:No  Duration of Psychotic Symptoms:No data recorded Hallucinations:No data  recorded Ideas of Reference:None  Suicidal Thoughts:No data recorded Homicidal Thoughts:No data recorded  Sensorium  Memory:Immediate Fair; Recent Fair; Remote Fair  Judgment:Impaired  Insight:Present   Executive Functions  Concentration:Fair  Attention Span:Fair  Recall:Fair  Fund of Knowledge:Fair  Language:Good   Psychomotor Activity  Psychomotor Activity:No data recorded  Assets  Assets:Desire for Improvement; Financial Resources/Insurance; Housing; Social Support   Sleep  Sleep:No data recorded   Physical Exam: Physical Exam Vitals and nursing note reviewed.  Constitutional:      Appearance: Normal appearance. She is normal weight.  Neurological:     General: No focal deficit present.     Mental Status: She is alert and oriented to person, place, and time.  Psychiatric:        Attention and Perception: Attention and perception normal.        Mood and Affect: Mood is anxious.        Speech: Speech normal.        Behavior: Behavior normal. Behavior is cooperative.        Thought Content: Thought content normal.        Cognition and Memory: Cognition and memory normal.        Judgment: Judgment normal.   Review of Systems  Constitutional: Negative.   HENT: Negative.    Eyes: Negative.   Respiratory: Negative.    Cardiovascular: Negative.   Gastrointestinal: Negative.   Genitourinary: Negative.   Musculoskeletal: Negative.   Skin: Negative.   Neurological: Negative.   Endo/Heme/Allergies: Negative.   Psychiatric/Behavioral: Negative.    Blood pressure 134/69, pulse 98, temperature 98.1 F (36.7 C), temperature source Oral, resp. rate 18, height 5\' 1"  (1.549 m), weight 48.5 kg, SpO2 98 %. Body mass index is 20.22 kg/m.   Social History   Tobacco Use  Smoking Status Former   Types: Cigarettes   Quit date: 12/19/1986   Years since quitting: 34.9  Smokeless Tobacco Never   Tobacco Cessation:  N/A, patient does not currently use tobacco  products  Blood Alcohol level:  Lab Results  Component Value Date   ETH <10 11/08/2021   ETH <10 09/25/2021    Metabolic Disorder Labs:  Lab Results  Component Value Date   HGBA1C 4.7 (L) 11/08/2021   MPG 88.19 11/08/2021   No results found for: PROLACTIN Lab Results  Component Value Date   CHOL 153 11/08/2021   TRIG 62 11/08/2021   HDL 54 11/08/2021   CHOLHDL 2.8 11/08/2021   VLDL 12 11/08/2021   LDLCALC 87 11/08/2021   LDLCALC 69 06/20/2020    See Psychiatric Specialty Exam and Suicide Risk Assessment completed by Attending Physician prior to discharge.  Discharge destination:  Home  Is patient on multiple antipsychotic therapies at discharge:  No   Has Patient had three or more failed trials of antipsychotic monotherapy by history:  No  Recommended Plan for Multiple Antipsychotic Therapies: NA   Allergies as of 11/19/2021       Reactions   Crestor [rosuvastatin Calcium] Other (See Comments)   abd pain   Cymbalta [duloxetine Hcl] Nausea Only   Effexor Xr [venlafaxine Hcl Er] Other (See Comments)   dizziness   Morphine And Related Itching   agitation   Pravastatin Other (See Comments)   abd pain   Promethazine-codeine Itching   Itching in face   Rizatriptan Other (See Comments)   Tingling, prickly sensation, feelings of palpitations, flushing.         Medication List     STOP taking these medications    clonazePAM 0.5 MG tablet Commonly known as: KLONOPIN       TAKE these medications      Indication  atorvastatin 40 MG tablet Commonly known as: LIPITOR Take 1 tablet by mouth once daily    carbidopa-levodopa 25-100 MG tablet Commonly known as: SINEMET IR Take 1 tablet by mouth 3 (three) times daily.    donepezil 5 MG tablet Commonly known as: ARICEPT Take 1 tablet (5 mg total) by mouth at bedtime.    Melatonin 10 MG Tabs Take 5-10 mg by mouth at bedtime.    mirtazapine 15 MG tablet Commonly known as: REMERON Take 1 tablet (15 mg  total) by mouth at bedtime.    ondansetron 4 MG tablet Commonly known as: ZOFRAN TAKE 1 TABLET BY MOUTH EVERY 8 HOURS AS NEEDED FOR NAUSEA OR VOMITING What changed: reasons to take this    propranolol 20 MG tablet Commonly known as: INDERAL Take 1 tablet (20 mg total) by mouth 2 (two) times daily. What changed: when to take this    QUEtiapine 100 MG tablet Commonly known as: SEROQUEL Take 1.5 tablets (150 mg total) by mouth at bedtime.    rOPINIRole 1 MG tablet Commonly known as: REQUIP Take 1 mg by mouth 3 (three) times daily.    traZODone 150 MG tablet Commonly known as: DESYREL Take 1 tablet (150 mg total) by mouth at bedtime as needed for sleep. What changed:  when to take this reasons to take this         Follow-up Information     BEHAVIORAL HEALTH OUTPATIENT THERAPY [place] Follow up on 12/04/2021.   Specialty: Behavioral Health Why: You have an appointment scheduled with Dr. Donell Beers on Wednesday February 22nd at 4:30pm. (virtual). Thanks! Contact information: [address] [id number] mc [place] [address] [phone]                Follow-up recommendations:  Dr. Donell Beers    Signed: [name],  DO 11/19/2021, 10:34 AM

## 2021-11-19 NOTE — Plan of Care (Signed)
Problem: Group Participation Goal: STG - Patient will focus on task/topic with 2 prompts from staff within 5 recreation therapy group sessions Description: STG - Patient will focus on task/topic with 2 prompts from staff within 5 recreation therapy group sessions 11/19/2021 1612 by Ernest Haber, LRT Outcome: Not Applicable 12/16/2479 [id number] by Ernest Haber, LRT Outcome: Not Met (add Reason) Note: Patient did not attend any groups.

## 2021-11-19 NOTE — Care Management Important Message (Signed)
Important Message  Patient Details  Name: Shannon Sutton MRN: [id number] Date of Birth: 1945-12-22   Medicare Important Message Given:  Yes     Aren Pryde A [place], LCSWA 11/19/2021, 9:47 AM

## 2021-11-19 NOTE — Plan of Care (Signed)
°  Problem: Education: Goal: Knowledge of [facility] Education information/materials will improve 11/19/2021 1100 by Nolon Bussing, RN Outcome: Adequate for Discharge 11/19/2021 1007 by Nolon Bussing, RN Outcome: Progressing Goal: Emotional status will improve 11/19/2021 1100 by Nolon Bussing, RN Outcome: Adequate for Discharge 11/19/2021 1007 by Nolon Bussing, RN Outcome: Progressing Goal: Mental status will improve 11/19/2021 1100 by Nolon Bussing, RN Outcome: Adequate for Discharge 11/19/2021 1007 by Nolon Bussing, RN Outcome: Progressing Goal: Verbalization of understanding the information provided will improve 11/19/2021 1100 by Nolon Bussing, RN Outcome: Adequate for Discharge 11/19/2021 1007 by Nolon Bussing, RN Outcome: Progressing   Problem: Activity: Goal: Interest or engagement in activities will improve 11/19/2021 1100 by Nolon Bussing, RN Outcome: Adequate for Discharge 11/19/2021 1007 by Nolon Bussing, RN Outcome: Progressing Goal: Sleeping patterns will improve 11/19/2021 1100 by Nolon Bussing, RN Outcome: Adequate for Discharge 11/19/2021 1007 by Nolon Bussing, RN Outcome: Progressing   Problem: Coping: Goal: Ability to verbalize frustrations and anger appropriately will improve 11/19/2021 1100 by Nolon Bussing, RN Outcome: Adequate for Discharge 11/19/2021 1007 by Nolon Bussing, RN Outcome: Progressing Goal: Ability to demonstrate self-control will improve 11/19/2021 1100 by Nolon Bussing, RN Outcome: Adequate for Discharge 11/19/2021 1007 by Nolon Bussing, RN Outcome: Progressing   Problem: Health Behavior/Discharge Planning: Goal: Identification of resources available to assist in meeting health care needs will improve 11/19/2021 1100 by Nolon Bussing, RN Outcome: Adequate for Discharge 11/19/2021 1007 by Nolon Bussing, RN Outcome: Progressing Goal: Compliance with treatment plan for  underlying cause of condition will improve 11/19/2021 1100 by Nolon Bussing, RN Outcome: Adequate for Discharge 11/19/2021 1007 by Nolon Bussing, RN Outcome: Progressing   Problem: Physical Regulation: Goal: Ability to maintain clinical measurements within normal limits will improve 11/19/2021 1100 by Nolon Bussing, RN Outcome: Adequate for Discharge 11/19/2021 1007 by Nolon Bussing, RN Outcome: Progressing   Problem: Safety: Goal: Periods of time without injury will increase 11/19/2021 1100 by Nolon Bussing, RN Outcome: Adequate for Discharge 11/19/2021 1007 by Nolon Bussing, RN Outcome: Progressing   Problem: Education: Goal: Ability to incorporate positive changes in behavior to improve self-esteem will improve 11/19/2021 1100 by Nolon Bussing, RN Outcome: Adequate for Discharge 11/19/2021 1007 by Nolon Bussing, RN Outcome: Progressing   Problem: Health Behavior/Discharge Planning: Goal: Ability to identify and utilize available resources and services will improve 11/19/2021 1100 by Nolon Bussing, RN Outcome: Adequate for Discharge 11/19/2021 1007 by Nolon Bussing, RN Outcome: Progressing Goal: Ability to remain free from injury will improve 11/19/2021 1100 by Nolon Bussing, RN Outcome: Adequate for Discharge 11/19/2021 1007 by Nolon Bussing, RN Outcome: Progressing   Problem: Self-Concept: Goal: Will verbalize positive feelings about self 11/19/2021 1100 by Nolon Bussing, RN Outcome: Adequate for Discharge 11/19/2021 1007 by Nolon Bussing, RN Outcome: Progressing   Problem: Skin Integrity: Goal: Demonstration of wound healing without infection will improve 11/19/2021 1100 by Nolon Bussing, RN Outcome: Adequate for Discharge 11/19/2021 1007 by Nolon Bussing, RN Outcome: Progressing

## 2021-11-19 NOTE — Progress Notes (Signed)
Recreation Therapy Notes  INPATIENT RECREATION TR PLAN  Patient Details Name: Shannon Sutton MRN: [id number] DOB: 12/07/1945 Today's Date: 11/19/2021  Rec Therapy Plan Is patient appropriate for Therapeutic Recreation?: Yes Treatment times per week: at least 3 Estimated Length of Stay: 5-7 days TR Treatment/Interventions: Group participation (Comment)  Discharge Criteria Pt will be discharged from therapy if:: Discharged Treatment plan/goals/alternatives discussed and agreed upon by:: Patient/family  Discharge Summary Short term goals set: Patient will focus on task/topic with 2 prompts from staff within 5 recreation therapy group sessions Short term goals met: Not met Reason goals not met: Patient did not attend any groups Therapeutic equipment acquired: N/A Reason patient discharged from therapy: Discharge from hospital Pt/family agrees with progress & goals achieved: Yes Date patient discharged from therapy: 11/19/21   Cloe Sockwell 11/19/2021, 4:13 PM

## 2021-11-19 NOTE — BHH Suicide Risk Assessment (Signed)
[facility] Discharge Suicide Risk Assessment   Principal Problem: Major depressive disorder, recurrent severe without psychotic features [facility]) Discharge Diagnoses: Principal Problem:   Major depressive disorder, recurrent severe without psychotic features ([place])   Total Time spent with patient: 1 hour  Musculoskeletal: Strength & Muscle Tone: within normal limits Gait & Station: normal Patient leans: N/A  Psychiatric Specialty Exam  Presentation  General Appearance: Appropriate for Environment; Casual  Eye Contact:Fair  Speech:Clear and Coherent; Normal Rate  Speech Volume:Normal  Handedness:No data recorded  Mood and Affect  Mood:Depressed; Anxious  Duration of Depression Symptoms: Greater than two weeks  Affect:Congruent   Thought Process  Thought Processes:Coherent  Descriptions of Associations:Intact  Orientation:Partial  Thought Content:Logical  History of Schizophrenia/Schizoaffective disorder:No  Duration of Psychotic Symptoms:No data recorded Hallucinations:No data recorded Ideas of Reference:None  Suicidal Thoughts:No data recorded Homicidal Thoughts:No data recorded  Sensorium  Memory:Immediate Fair; Recent Fair; Remote Fair  Judgment:Impaired  Insight:Present   Executive Functions  Concentration:Fair  Attention Span:Fair  [place]  Language:Good   Psychomotor Activity  Psychomotor Activity:No data recorded  Assets  Assets:Desire for Improvement; Financial Resources/Insurance; Housing; Social Support   Sleep  Sleep:No data recorded  Physical Exam: Physical Exam Vitals and nursing note reviewed.  Constitutional:      Appearance: Normal appearance. She is normal weight.  Neurological:     General: No focal deficit present.     Mental Status: She is alert and oriented to person, place, and time.  Psychiatric:        Attention and Perception: Attention and perception normal.        Mood and Affect:  Mood and affect normal.        Speech: Speech normal.        Behavior: Behavior is uncooperative.        Thought Content: Thought content normal.        Cognition and Memory: Cognition and memory normal.        Judgment: Judgment normal.   Review of Systems  Constitutional: Negative.   HENT: Negative.    Eyes: Negative.   Respiratory: Negative.    Cardiovascular: Negative.   Gastrointestinal: Negative.   Genitourinary: Negative.   Musculoskeletal: Negative.   Skin: Negative.   Neurological: Negative.   Endo/Heme/Allergies: Negative.   Psychiatric/Behavioral: Negative.    Blood pressure 134/69, pulse 98, temperature 98.1 F (36.7 C), temperature source Oral, resp. rate 18, height 5\' 1"  (1.549 m), weight 48.5 kg, SpO2 98 %. Body mass index is 20.22 kg/m.  Mental Status Per Nursing Assessment::   On Admission:  NA  Demographic Factors:  Age 76 or older and Caucasian  Loss Factors: NA  Historical Factors: Impulsivity  Risk Reduction Factors:   Positive social support  Continued Clinical Symptoms:  Medical Diagnoses and Treatments/Surgeries  Cognitive Features That Contribute To Risk:  Thought constriction (tunnel vision)    Suicide Risk:  Minimal: No identifiable suicidal ideation.  Patients presenting with no risk factors but with morbid ruminations; may be classified as minimal risk based on the severity of the depressive symptoms   Follow-up [place] Follow up on 12/04/2021.   Specialty: Behavioral Health Why: You have an appointment scheduled with Dr. Casimiro Needle on Wednesday February 22nd at 4:30pm. (virtual). Thanks! Contact information: [place] [phone]  Parks Ranger, DO 11/19/2021, 10:19 AM

## 2021-11-19 NOTE — Plan of Care (Signed)
Patient presents A&O x3. Patient is irritated this morning and has anxiety and depression due to wanting to get to [place]. She stays isolated in her room. Patient denies AVH, SI, HI. Patient compliant with all scheduled meds. Patient ate breakfast in the day room about 50% and spent most of the shift in her room. Appropriate interaction with staff and peers observed. Q15 minute safety checks continued per unit protocol. Will continue to monitor.   Problem: Education: Goal: Knowledge of [facility] Education information/materials will improve Outcome: Progressing Goal: Emotional status will improve Outcome: Progressing Goal: Mental status will improve Outcome: Progressing Goal: Verbalization of understanding the information provided will improve Outcome: Progressing   Problem: Activity: Goal: Interest or engagement in activities will improve Outcome: Progressing Goal: Sleeping patterns will improve Outcome: Progressing   Problem: Coping: Goal: Ability to verbalize frustrations and anger appropriately will improve Outcome: Progressing Goal: Ability to demonstrate self-control will improve Outcome: Progressing   Problem: Health Behavior/Discharge Planning: Goal: Identification of resources available to assist in meeting health care needs will improve Outcome: Progressing Goal: Compliance with treatment plan for underlying cause of condition will improve Outcome: Progressing   Problem: Physical Regulation: Goal: Ability to maintain clinical measurements within normal limits will improve Outcome: Progressing   Problem: Safety: Goal: Periods of time without injury will increase Outcome: Progressing   Problem: Education: Goal: Ability to incorporate positive changes in behavior to improve self-esteem will improve Outcome: Progressing   Problem: Health Behavior/Discharge Planning: Goal: Ability to identify and utilize available resources and services will  improve Outcome: Progressing Goal: Ability to remain free from injury will improve Outcome: Progressing   Problem: Self-Concept: Goal: Will verbalize positive feelings about self Outcome: Progressing   Problem: Skin Integrity: Goal: Demonstration of wound healing without infection will improve Outcome: Progressing

## 2021-11-19 NOTE — Progress Notes (Signed)
Patient alert and oriented x 2 with periods of confusion noted.  Affect is pleasant and calm. Denies anxiety, SI/HI or AVH.  Patient states they will try to keep themselves safe when they return home.  Reviewed discharge instructions with patient including follow up appointment with provider, medication and prescriptions. Questions answered and understanding verbalized.  Discharge packet given. All belongings returned to patient after verification completed by staff.    Patient escorted by staff off unit at this time stable without complaint.

## 2021-11-21 DIAGNOSIS — F32A Depression, unspecified: Secondary | ICD-10-CM | POA: Diagnosis not present

## 2021-11-21 DIAGNOSIS — R69 Illness, unspecified: Secondary | ICD-10-CM | POA: Diagnosis not present

## 2021-11-21 DIAGNOSIS — Z9181 History of falling: Secondary | ICD-10-CM | POA: Diagnosis not present

## 2021-11-21 DIAGNOSIS — E785 Hyperlipidemia, unspecified: Secondary | ICD-10-CM | POA: Diagnosis not present

## 2021-11-21 DIAGNOSIS — M797 Fibromyalgia: Secondary | ICD-10-CM | POA: Diagnosis not present

## 2021-11-21 DIAGNOSIS — G2581 Restless legs syndrome: Secondary | ICD-10-CM | POA: Diagnosis not present

## 2021-11-21 DIAGNOSIS — F0280 Dementia in other diseases classified elsewhere without behavioral disturbance: Secondary | ICD-10-CM | POA: Diagnosis not present

## 2021-11-21 DIAGNOSIS — G20 Parkinson's disease: Secondary | ICD-10-CM | POA: Diagnosis not present

## 2021-11-22 DIAGNOSIS — G259 Extrapyramidal and movement disorder, unspecified: Secondary | ICD-10-CM | POA: Diagnosis not present

## 2021-11-22 DIAGNOSIS — G2581 Restless legs syndrome: Secondary | ICD-10-CM | POA: Diagnosis not present

## 2021-11-22 DIAGNOSIS — G4700 Insomnia, unspecified: Secondary | ICD-10-CM | POA: Diagnosis not present

## 2021-11-22 DIAGNOSIS — F329 Major depressive disorder, single episode, unspecified: Secondary | ICD-10-CM | POA: Diagnosis not present

## 2021-11-22 DIAGNOSIS — T43505A Adverse effect of unspecified antipsychotics and neuroleptics, initial encounter: Secondary | ICD-10-CM | POA: Diagnosis not present

## 2021-11-22 DIAGNOSIS — F0393 Unspecified dementia, unspecified severity, with mood disturbance: Secondary | ICD-10-CM | POA: Diagnosis not present

## 2021-11-22 DIAGNOSIS — G20 Parkinson's disease: Secondary | ICD-10-CM | POA: Diagnosis not present

## 2021-11-22 DIAGNOSIS — Y289XXA Contact with unspecified sharp object, undetermined intent, initial encounter: Secondary | ICD-10-CM | POA: Diagnosis not present

## 2021-11-22 DIAGNOSIS — R69 Illness, unspecified: Secondary | ICD-10-CM | POA: Diagnosis not present

## 2021-11-25 ENCOUNTER — Ambulatory Visit: Payer: Medicare HMO | Admitting: Diagnostic Neuroimaging

## 2021-11-25 DIAGNOSIS — Z9181 History of falling: Secondary | ICD-10-CM | POA: Diagnosis not present

## 2021-11-25 DIAGNOSIS — G20 Parkinson's disease: Secondary | ICD-10-CM | POA: Diagnosis not present

## 2021-11-25 DIAGNOSIS — F0280 Dementia in other diseases classified elsewhere without behavioral disturbance: Secondary | ICD-10-CM | POA: Diagnosis not present

## 2021-11-25 DIAGNOSIS — M797 Fibromyalgia: Secondary | ICD-10-CM | POA: Diagnosis not present

## 2021-11-25 DIAGNOSIS — E785 Hyperlipidemia, unspecified: Secondary | ICD-10-CM | POA: Diagnosis not present

## 2021-11-25 DIAGNOSIS — R69 Illness, unspecified: Secondary | ICD-10-CM | POA: Diagnosis not present

## 2021-11-25 DIAGNOSIS — G2581 Restless legs syndrome: Secondary | ICD-10-CM | POA: Diagnosis not present

## 2021-11-25 DIAGNOSIS — F32A Depression, unspecified: Secondary | ICD-10-CM | POA: Diagnosis not present

## 2021-12-04 ENCOUNTER — Ambulatory Visit (HOSPITAL_BASED_OUTPATIENT_CLINIC_OR_DEPARTMENT_OTHER): Payer: Medicare HMO | Admitting: Psychiatry

## 2021-12-04 ENCOUNTER — Other Ambulatory Visit: Payer: Self-pay

## 2021-12-04 DIAGNOSIS — F341 Dysthymic disorder: Secondary | ICD-10-CM

## 2021-12-04 DIAGNOSIS — R69 Illness, unspecified: Secondary | ICD-10-CM | POA: Diagnosis not present

## 2021-12-04 NOTE — Progress Notes (Signed)
Psychiatric Initial Adult Assessment   Patient Identification: Shannon Sutton MRN:  [id number] Date of Evaluation:  12/04/2021 Referral Source Dr. Alain Marion Chief Complaint:  Depression Visit Diagnosis major depression single episode in remission  History of Present Illness   Today this patient was interviewed by the phone.  She presently is at [facility] assisted living.  She is actually doing very well.  She is sleeping and eating well.  She has her own room and a few additional rooms.  She says she is eating well.  When she describes the heebie-jeebies which was excessive anxiety now is gone.  She takes her medicines as prescribed.  She shows no evidence of psychosis.  She says her Parkinson's is actually a little bit better.  Patient likes to watch TV and reads.  She is functioning very well.  She gets her medicines through the assisted living setting she is in at this time I will not change any of them.  We will call her back to get another appointment in 3 or 4 months but at this time she is doing very well.  Virtual Visit via Telephone Note  I connected with Shannon Sutton 08/21/2021 at  4:30 PM EST by telephone and verified that I am speaking with the correct person using two identifiers.  Location: Patient: home Provider: office   I discussed the limitations, risks, security and privacy concerns of performing an evaluation and management service by telephone and the availability of in person appointments. I also discussed with the patient that there may be a patient responsible charge related to this service. The patient expressed understanding and agreed to proceed.       I discussed the assessment and treatment plan with the patient. The patient was provided an opportunity to ask questions and all were answered. The patient agreed with the plan and demonstrated an understanding of the instructions.   The patient was advised to call back or seek an in-person evaluation if the  symptoms worsen or if the condition fails to improve as anticipated.  I provided 30 minutes of non-face-to-face time during this encounter.   Jerral Ralph, MD   Depression Symptoms:  depressed mood, (Hypo) Manic Symptoms:   Anxiety Symptoms:   Psychotic Symptoms:   PTSD Symptoms:   Past Psychiatric History: Prozac, Effexor,Vybrid  Previous Psychotropic Medications: Yes   Substance Abuse History in the last 12 months:  No.  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  Dr Trudie Reed 2009 had a bx/MRI: was on MTX and Prednisone   External hemorrhoids    Fibromyalgia with myofascial pain 11/16/2012   Headaches, cluster    History of CT scan of head 2022   History of EKG 2021   History of mammogram 2022   History of MRI 2022   Brain   Hypercholesterolemia 12/15/2013   Hypertension    Insomnia 11/16/2012   Myalgia and myositis, unspecified    Other disorder of muscle, ligament, and fascia    eosinophilic fascitis   Other specified disease of hair and hair follicles    Pain in joint, multiple sites    Parkinson's disease ([place])    Restless leg  syndrome 12/15/2013   Unspecified sleep apnea    Urine incontinence     Past Surgical History:  Procedure Laterality Date   ABDOMINAL HYSTERECTOMY     ABDOMINOPLASTY  2006   "tummy tuck"   BREAST REDUCTION SURGERY  1995   BREAST SURGERY     CATARACT EXTRACTION, BILATERAL     COLONOSCOPY  2018   MUSCLE BIOPSY  2009   DEEP TISSUE   REFRACTIVE SURGERY     TONSILLECTOMY     TOTAL ABDOMINAL HYSTERECTOMY  1999   WISDOM TOOTH EXTRACTION      Family Psychiatric History:   Family History:  Family History  Problem Relation Age of Onset   Colon  polyps Mother    Heart disease Mother    Heart attack Mother    Heart attack Father    Colon cancer Father 48   Heart disease Father    COPD Sister    Emphysema Sister    COPD Brother    Emphysema Brother    Lung cancer Brother    Breast cancer Paternal Grandmother    Heart disease Paternal Grandfather    Colon polyps Paternal Grandfather    Heart disease Other        family history   Parkinson's disease Other    Stomach cancer Neg Hx    Rectal cancer Neg Hx    Esophageal cancer Neg Hx     Social History:   Social History   Socioeconomic History   Marital status: Divorced    Spouse name: Not on file   Number of children: 2   Years of education: Not on file   Highest education level: Some college, no degree  Occupational History   Occupation: Retired    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  Tobacco Use   Smoking status: Former    Types: Cigarettes    Quit date: 12/19/1986    Years since quitting: 34.9   Smokeless tobacco: Never  Vaping Use   Vaping Use: Never used  Substance and Sexual Activity   Alcohol use: Not Currently   Drug use: No   Sexual activity: Yes    Birth control/protection: Surgical, Post-menopausal  Other Topics Concern   Not on file  Social History Narrative   Diet:      Caffeine: occasionally iced coffee      Married, if yes what year: Divorced      Do you live in a house, apartment, assisted living, condo, trailer, ect: House      Is it one or more stories: 1      How many persons live in your home? 1      Pets: Dog      Highest level or education completed: Some college      Current/Past profession: Patent examiner      Exercise:  No                Type and how often:          Living Will: Yes   DNR: No and would like to discuss   POA/HPOA: Yes      Functional Status:   Do you have difficulty bathing or dressing yourself? No   Do you have difficulty preparing food or eating? Yes, occasionally   Do you  have difficulty managing your medications? Yes   Do you have difficulty managing your finances? Yes ( forget password and double pays   Do you have  difficulty affording your medications? No   Social Determinants of Radio broadcast assistant Strain: Low Risk    Difficulty of Paying Living Expenses: Not hard at all  Food Insecurity: No Food Insecurity   Worried About Charity fundraiser in the Last Year: Never true   Arboriculturist in the Last Year: Never true  Transportation Needs: Unknown   Lack of Transportation (Medical): No   Lack of Transportation (Non-Medical): Patient refused  Physical Activity: Unknown   Days of Exercise per Week: 0 days   Minutes of Exercise per Session: Not on file  Stress: Stress Concern Present   Feeling of Stress : Very much  Social Connections: Socially Isolated   Frequency of Communication with Friends and Family: More than three times a week   Frequency of Social Gatherings with Friends and Family: Three times a week   Attends Religious Services: Never   Active Member of Clubs or Organizations: No   Attends Music therapist: Not on file   Marital Status: Divorced    Additional Social History:   Allergies:   Allergies  Allergen Reactions   Crestor [Rosuvastatin Calcium] Other (See Comments)    abd pain   Cymbalta [Duloxetine Hcl] Nausea Only   Effexor Xr [Venlafaxine Hcl Er] Other (See Comments)    dizziness   Morphine And Related Itching    agitation   Pravastatin Other (See Comments)    abd pain   Promethazine-Codeine Itching    Itching in face   Rizatriptan Other (See Comments)    Tingling, prickly sensation, feelings of palpitations, flushing.     Metabolic Disorder Labs: Lab Results  Component Value Date   HGBA1C 4.7 (L) 11/08/2021   MPG 88.19 11/08/2021   No results found for: PROLACTIN Lab Results  Component Value Date   CHOL 153 11/08/2021   TRIG 62 11/08/2021   HDL 54 11/08/2021   CHOLHDL 2.8  11/08/2021   VLDL 12 11/08/2021   LDLCALC 87 11/08/2021   LDLCALC 69 06/20/2020     Current Medications: Current Outpatient Medications  Medication Sig Dispense Refill   atorvastatin (LIPITOR) 40 MG tablet Take 1 tablet by mouth once daily (Patient taking differently: Take 40 mg by mouth daily.) 90 tablet 3   carbidopa-levodopa (SINEMET IR) 25-100 MG tablet Take 1 tablet by mouth 3 (three) times daily.     donepezil (ARICEPT) 5 MG tablet Take 1 tablet (5 mg total) by mouth at bedtime.     Melatonin 10 MG TABS Take 5-10 mg by mouth at bedtime.     mirtazapine (REMERON) 15 MG tablet Take 1 tablet (15 mg total) by mouth at bedtime. 30 tablet 2   ondansetron (ZOFRAN) 4 MG tablet TAKE 1 TABLET BY MOUTH EVERY 8 HOURS AS NEEDED FOR NAUSEA OR VOMITING (Patient taking differently: Take 4 mg by mouth every 8 (eight) hours as needed.) 45 tablet 0   propranolol (INDERAL) 20 MG tablet Take 1 tablet (20 mg total) by mouth 2 (two) times daily. (Patient taking differently: Take 20 mg by mouth every evening.) 60 tablet 2   QUEtiapine Fumarate (SEROQUEL) 100 MG tablet Take 1.5 tablets (150 mg total) by mouth at bedtime. 45 tablet 2   rOPINIRole (REQUIP) 1 MG tablet Take 1 mg by mouth 3 (three) times daily.     traZODone (DESYREL) 150 MG tablet Take 1 tablet (150 mg total) by mouth at bedtime as needed for sleep. 30 tablet 2   No current  facility-administered medications for this visit.    Neurologic: Headache: No Seizure: No Paresthesias:No  Musculoskeletal: Strength & Muscle Tone: abnormal Gait & Station: unsteady Patient leans: N/A  Psychiatric Specialty Exam: ROS  There were no vitals taken for this visit.There is no height or weight on file to calculate BMI.  General Appearance: Fairly Groomed  Eye Contact:  Good  Speech:  Clear and Coherent  Volume:  Normal  Mood:  Depressed  Affect:  Appropriate  Thought Process:  Goal Directed  Orientation:  Full (Time, Place, and Person)  Thought  Content:  WDL  Suicidal Thoughts:  No  Homicidal Thoughts:  No  Memory:  NA  Judgement:  Good  Insight:  Fair  Psychomotor Activity:  Normal  Concentration:    Recall:  [place] of Knowledge:Good  Language: Fair  Akathisia:  No  Handed:  Right  AIMS (if indicated):    Assets:  Desire for Improvement  ADL's:  Intact  Cognition: WNL  Sleep:      Treatment Plan Summary:  2/22/20234:13 PM     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  She is doing very well taking Remeron 15 mg.  Patient also takes trazodone for sleep.  The patient apparently is also on Seroquel.  The patient is doing very well and will return to see me either by phone or in person in about 3 months.

## 2021-12-05 DIAGNOSIS — E785 Hyperlipidemia, unspecified: Secondary | ICD-10-CM | POA: Diagnosis not present

## 2021-12-05 DIAGNOSIS — G20 Parkinson's disease: Secondary | ICD-10-CM | POA: Diagnosis not present

## 2021-12-05 DIAGNOSIS — R69 Illness, unspecified: Secondary | ICD-10-CM | POA: Diagnosis not present

## 2021-12-05 DIAGNOSIS — F32A Depression, unspecified: Secondary | ICD-10-CM | POA: Diagnosis not present

## 2021-12-05 DIAGNOSIS — F0280 Dementia in other diseases classified elsewhere without behavioral disturbance: Secondary | ICD-10-CM | POA: Diagnosis not present

## 2021-12-05 DIAGNOSIS — Z9181 History of falling: Secondary | ICD-10-CM | POA: Diagnosis not present

## 2021-12-05 DIAGNOSIS — G2581 Restless legs syndrome: Secondary | ICD-10-CM | POA: Diagnosis not present

## 2021-12-05 DIAGNOSIS — M797 Fibromyalgia: Secondary | ICD-10-CM | POA: Diagnosis not present

## 2021-12-09 DIAGNOSIS — R69 Illness, unspecified: Secondary | ICD-10-CM | POA: Diagnosis not present

## 2021-12-12 ENCOUNTER — Ambulatory Visit: Payer: Medicare HMO | Admitting: Internal Medicine

## 2021-12-12 DIAGNOSIS — Z9181 History of falling: Secondary | ICD-10-CM | POA: Diagnosis not present

## 2021-12-12 DIAGNOSIS — E785 Hyperlipidemia, unspecified: Secondary | ICD-10-CM | POA: Diagnosis not present

## 2021-12-12 DIAGNOSIS — G2581 Restless legs syndrome: Secondary | ICD-10-CM | POA: Diagnosis not present

## 2021-12-12 DIAGNOSIS — F0280 Dementia in other diseases classified elsewhere without behavioral disturbance: Secondary | ICD-10-CM | POA: Diagnosis not present

## 2021-12-12 DIAGNOSIS — R69 Illness, unspecified: Secondary | ICD-10-CM | POA: Diagnosis not present

## 2021-12-12 DIAGNOSIS — M797 Fibromyalgia: Secondary | ICD-10-CM | POA: Diagnosis not present

## 2021-12-12 DIAGNOSIS — G20 Parkinson's disease: Secondary | ICD-10-CM | POA: Diagnosis not present

## 2021-12-12 DIAGNOSIS — F32A Depression, unspecified: Secondary | ICD-10-CM | POA: Diagnosis not present

## 2021-12-16 DIAGNOSIS — G2581 Restless legs syndrome: Secondary | ICD-10-CM | POA: Diagnosis not present

## 2021-12-16 DIAGNOSIS — E785 Hyperlipidemia, unspecified: Secondary | ICD-10-CM | POA: Diagnosis not present

## 2021-12-16 DIAGNOSIS — Z9181 History of falling: Secondary | ICD-10-CM | POA: Diagnosis not present

## 2021-12-16 DIAGNOSIS — R69 Illness, unspecified: Secondary | ICD-10-CM | POA: Diagnosis not present

## 2021-12-16 DIAGNOSIS — G20 Parkinson's disease: Secondary | ICD-10-CM | POA: Diagnosis not present

## 2021-12-16 DIAGNOSIS — F0280 Dementia in other diseases classified elsewhere without behavioral disturbance: Secondary | ICD-10-CM | POA: Diagnosis not present

## 2021-12-16 DIAGNOSIS — F32A Depression, unspecified: Secondary | ICD-10-CM | POA: Diagnosis not present

## 2021-12-16 DIAGNOSIS — M797 Fibromyalgia: Secondary | ICD-10-CM | POA: Diagnosis not present

## 2021-12-17 ENCOUNTER — Telehealth: Payer: Self-pay | Admitting: Internal Medicine

## 2021-12-17 ENCOUNTER — Encounter: Payer: Self-pay | Admitting: Diagnostic Neuroimaging

## 2021-12-17 ENCOUNTER — Ambulatory Visit: Payer: Medicare HMO | Admitting: Diagnostic Neuroimaging

## 2021-12-17 ENCOUNTER — Other Ambulatory Visit: Payer: Self-pay

## 2021-12-17 VITALS — BP 97/59 | HR 61 | Ht 60.5 in | Wt 114.8 lb

## 2021-12-17 DIAGNOSIS — F419 Anxiety disorder, unspecified: Secondary | ICD-10-CM | POA: Diagnosis not present

## 2021-12-17 DIAGNOSIS — G20 Parkinson's disease: Secondary | ICD-10-CM | POA: Diagnosis not present

## 2021-12-17 DIAGNOSIS — G2581 Restless legs syndrome: Secondary | ICD-10-CM

## 2021-12-17 DIAGNOSIS — R69 Illness, unspecified: Secondary | ICD-10-CM | POA: Diagnosis not present

## 2021-12-17 DIAGNOSIS — F332 Major depressive disorder, recurrent severe without psychotic features: Secondary | ICD-10-CM | POA: Diagnosis not present

## 2021-12-17 NOTE — Progress Notes (Signed)
? ? ?Chief Complaint  ?Patient presents with  ? Parkinsonism  ?  Rm 6 One Yr FU, dgtr- [place]  "dizziness, lightheadedness"   ? ? ?History of Present Illness: ? ?UPDATE (12/17/21, VRP): Since last visit, now living at [facility]. Has been seeing [facility] neurology (Dr. Linus Mako) and Dr. Casimiro Needle (psychiatry). Having more dizziness today, with low BP reading.  ? ?UPDATE (09/19/20, VRP): Since last visit, had 2nd opinion with Dr. Linus Mako and PA Nyoka Cowden; now on carb / levo 1 tab 4x per day. Also on ropinirole 0.'5mg'$  three times a day (sometimes 4x per day; helping with internal restlessness). Anxiety, insomnia continue. Dizziness (lightheadedness) intermittent; some low BP reading (90/60) but most of the time ok.  ? ?UPDATE (11/01/2018, VRP): She continues to have blurred vision, headaches and tremor of hands. Blurred vision is worse with bright lights. She is having right sided temporal and occipital headaches. They are throbbing with photosensitivity and nausea. She kept BP log and readings were normal. No migraine diary. Resting tremor is worse in the evening. L> R hand. PT in 08/2018 did not help much. She is going to GYM 3 times a week, both cardio and light weights. She continues to [employer] about being forgetful. She is constantly hoarse and has intermittent trouble swallowing saliva. No trouble swallowing food or liquids. No falls. She is taking ropinirole 0.'25mg'$  in the am and 0.50 in the pm. She is tolerating well. She did not increase dose due to dizziness. She feels that symptoms are no worse since last visit in 08/2018 but no better.  ?  ?UPDATE (08/27/18, VRP): Since last visit, symptoms are progressing.  Now patient having more problems with lightheadedness, dizziness, gait and balance difficulty, tremor, fatigue.  Also with more confusion, difficulty swallowing, swallowing difficulty hoarse voice, difficulty with handwriting. ?  ?Also reports increasing photosensitivity and blurred vision.  She does have  history of headaches since age 76 years old with right temporal throbbing painful sensation associate with photophobia and nausea. ?  ?PRIOR HPI (12/02/17): 76 year old right-handed female here for evaluation of tremor, weakness, blurred vision, headaches, fatigue.  Evaluate for Parkinson symptoms. ?  ?Patient reports intermittent weakness and shaky sensation with heart racing feeling since February 2018.  Patient also having some intermittent tremor, mainly with certain postures and actions, mainly in her left hand, dating back to January 2017.  She had evaluation with movement disorder specialist Dr. Carles Collet in 2017, who felt that patient did not have Parkinson's disease at that time. ?  ?Long history of loss of smell.  Patient feels like her handwriting is worsening.  Patient also concerned about her family history of Parkinson's disease in her paternal grandfather and maternal aunt. ?  ?Patient also having issues with depression, confusion, memory loss, balance problems. ?  ?Patient has intermittent headaches with pressure sensation.  No nausea or vomiting.  No photophobia or phonophobia. ?  ?Patient has been struggling with her vision and has gone through "$2000 worth" of different glasses and eye exams without benefit in her vision. ?  ? ? ?  ?Observations/Objective: ? ?GENERAL EXAM/CONSTITUTIONAL: ?Vitals:  ?Vitals:  ? 12/17/21 0857 12/17/21 0949  ?BP: (!) 80/52 (!) 97/59  ?Pulse: 74 61  ?Weight: 114 lb 12.8 oz (52.1 kg)   ?Height: 5' 0.5" (1.537 m)   ? ?Body mass index is 22.05 kg/m?. ?Wt Readings from Last 3 Encounters:  ?12/17/21 114 lb 12.8 oz (52.1 kg)  ?11/10/21 107 lb (48.5 kg)  ?11/08/21 113 lb (51.3  kg)  ? ?No data found. ? ?Patient is in no distress; well developed, nourished and groomed; neck is supple ? ?CARDIOVASCULAR: ?Examination of carotid arteries is normal; no carotid bruits ?Regular rate and rhythm, no murmurs ?Examination of peripheral vascular system by observation and palpation is  normal ? ?EYES: ?Ophthalmoscopic exam of optic discs and posterior segments is normal; no papilledema or hemorrhages ?No results found. ? ?MUSCULOSKELETAL: ?Gait, strength, tone, movements noted in Neurologic exam below ? ?NEUROLOGIC: ?MENTAL STATUS:  ?No flowsheet data found. ?awake, alert, oriented to person, place and time ?recent and remote memory intact ?normal attention and concentration ?language fluent, comprehension intact, naming intact ?fund of knowledge appropriate ? ?CRANIAL NERVE:  ?2nd - no papilledema on fundoscopic exam ?2nd, 3rd, 4th, 6th - pupils equal and reactive to light, visual fields full to confrontation, extraocular muscles intact, no nystagmus ?5th - facial sensation symmetric ?7th - facial strength symmetric ?8th - hearing intact ?9th - palate elevates symmetrically, uvula midline ?11th - shoulder shrug symmetric ?12th - tongue protrusion midline ?MASKED FACIES ? ?MOTOR:  ?normal bulk and tone, full strength in the BUE, BLE; NO BRADYKINESIA; NO TREMOR; MINIMAL INCREASED TONE IN LUE ? ?SENSORY:  ?normal and symmetric to light touch, temperature, vibration ? ?COORDINATION:  ?finger-nose-finger, fine finger movements normal ? ?REFLEXES:  ?deep tendon reflexes present and symmetric ? ?GAIT/STATION:  ?narrow based gait; SMOOTH STRIDE AND TURNING ? ? ?04/08/17 MRI brain ?- The pituitary appears mildly atrophic, but there are no features suggestive of microadenoma, macroadenoma, or parasellar mass. ?- Moderate atrophy and small vessel disease. No hypothalamic abnormality. ?- No acute or focal intracranial findings. Normal postcontrast appearance of the brain. ? ?12/30/18 DATscan ?- Subtle truncation of the posterior RIGHT striata compared to the LEFT. While this could be an anatomic variation, the basal ganglia are symmetric RIGHT to LEFT on comparison MRI. Findings could indicate Parkinson's syndrome pathology. ? ? ?Assessment and Plan: ? ?76 y.o.  ? ?Dx: ? ?1. Parkinsonism, unspecified  Parkinsonism type ([place])   ?2. Restless leg syndrome   ?3. Major depressive disorder, recurrent severe without psychotic features ([place])   ?4. Anxiety   ? ? ? ?PARKINSONISM (postural instability / gait difficulty subtype vs other atypical parkinsonism) ?- continue ropinirole 1.'5mg'$  three times a day  ?- continue carb/levo 25/100 1 tab 3x per day (exam improved since starting carb/levo) ?- may consider rasagiline / selegiline or amantadine in future ?- continue physical therapy for gait and balance training ?- improve nutrition, depression and anxiety treatment ?- monitor BP at home; low BP today; may need to hold propranolol and follow up with PCP or cardiology ? ?ANXIETY / INSOMNIA / ADJUSTMENT DISORDER ?- per Dr. Casimiro Needle (on clonazepam, quetiapine, mirtazipine, trazodone) ? 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 ? ?Follow Up Instructions: ? ?- Return for pending if symptoms worsen or fail to improve.  ?  ? ? ?Penni Bombard, MD 01/17/4258, 5:63 AM ?Certified in Neurology, Neurophysiology and Neuroimaging ? ?[facility] ?[address], [address] ?[place], [place] [address] ?[phone] ? ?

## 2021-12-17 NOTE — Telephone Encounter (Signed)
Patient daughter Shannon Sutton called in stating that patient neurogologists advised her to call PCP to get BP medication modified because patient was experiencing a low BP. ? ?Shannon Sutton could be contacted at [phone]. ? ?Please advise. ?

## 2021-12-17 NOTE — Telephone Encounter (Signed)
Left message on machine for daughter Shannon Sutton to schedule an appointment. ?

## 2021-12-19 ENCOUNTER — Telehealth (HOSPITAL_COMMUNITY): Payer: Self-pay | Admitting: *Deleted

## 2021-12-19 DIAGNOSIS — I10 Essential (primary) hypertension: Secondary | ICD-10-CM | POA: Diagnosis not present

## 2021-12-19 DIAGNOSIS — G4700 Insomnia, unspecified: Secondary | ICD-10-CM | POA: Diagnosis not present

## 2021-12-19 DIAGNOSIS — Y289XXA Contact with unspecified sharp object, undetermined intent, initial encounter: Secondary | ICD-10-CM | POA: Diagnosis not present

## 2021-12-19 DIAGNOSIS — R69 Illness, unspecified: Secondary | ICD-10-CM | POA: Diagnosis not present

## 2021-12-19 DIAGNOSIS — F03A18 Unspecified dementia, mild, with other behavioral disturbance: Secondary | ICD-10-CM | POA: Diagnosis not present

## 2021-12-19 DIAGNOSIS — G259 Extrapyramidal and movement disorder, unspecified: Secondary | ICD-10-CM | POA: Diagnosis not present

## 2021-12-19 DIAGNOSIS — F39 Unspecified mood [affective] disorder: Secondary | ICD-10-CM | POA: Diagnosis not present

## 2021-12-19 DIAGNOSIS — G44029 Chronic cluster headache, not intractable: Secondary | ICD-10-CM | POA: Diagnosis not present

## 2021-12-19 DIAGNOSIS — F32A Depression, unspecified: Secondary | ICD-10-CM | POA: Diagnosis not present

## 2021-12-19 DIAGNOSIS — F411 Generalized anxiety disorder: Secondary | ICD-10-CM | POA: Diagnosis not present

## 2021-12-19 DIAGNOSIS — F5101 Primary insomnia: Secondary | ICD-10-CM | POA: Diagnosis not present

## 2021-12-19 DIAGNOSIS — G20 Parkinson's disease: Secondary | ICD-10-CM | POA: Diagnosis not present

## 2021-12-19 NOTE — Telephone Encounter (Signed)
Left message on machine for daughter to return our call 

## 2021-12-19 NOTE — Telephone Encounter (Signed)
Pt called c/o decreased, disturbed, sleep and says she feels Trazodone is not working anymore. Pt next scheduled appointment is on 03/05/22. Please review and advise. ?

## 2021-12-25 DIAGNOSIS — R2689 Other abnormalities of gait and mobility: Secondary | ICD-10-CM | POA: Diagnosis not present

## 2021-12-25 DIAGNOSIS — M6281 Muscle weakness (generalized): Secondary | ICD-10-CM | POA: Diagnosis not present

## 2021-12-30 DIAGNOSIS — M6281 Muscle weakness (generalized): Secondary | ICD-10-CM | POA: Diagnosis not present

## 2021-12-30 DIAGNOSIS — R2689 Other abnormalities of gait and mobility: Secondary | ICD-10-CM | POA: Diagnosis not present

## 2022-01-01 DIAGNOSIS — M6281 Muscle weakness (generalized): Secondary | ICD-10-CM | POA: Diagnosis not present

## 2022-01-01 DIAGNOSIS — R2689 Other abnormalities of gait and mobility: Secondary | ICD-10-CM | POA: Diagnosis not present

## 2022-01-03 DIAGNOSIS — R2689 Other abnormalities of gait and mobility: Secondary | ICD-10-CM | POA: Diagnosis not present

## 2022-01-03 DIAGNOSIS — M6281 Muscle weakness (generalized): Secondary | ICD-10-CM | POA: Diagnosis not present

## 2022-01-04 DIAGNOSIS — F5101 Primary insomnia: Secondary | ICD-10-CM | POA: Diagnosis not present

## 2022-01-04 DIAGNOSIS — R69 Illness, unspecified: Secondary | ICD-10-CM | POA: Diagnosis not present

## 2022-01-04 DIAGNOSIS — G20 Parkinson's disease: Secondary | ICD-10-CM | POA: Diagnosis not present

## 2022-01-04 DIAGNOSIS — F411 Generalized anxiety disorder: Secondary | ICD-10-CM | POA: Diagnosis not present

## 2022-01-04 DIAGNOSIS — Y289XXA Contact with unspecified sharp object, undetermined intent, initial encounter: Secondary | ICD-10-CM | POA: Diagnosis not present

## 2022-01-04 DIAGNOSIS — F39 Unspecified mood [affective] disorder: Secondary | ICD-10-CM | POA: Diagnosis not present

## 2022-01-04 DIAGNOSIS — F32A Depression, unspecified: Secondary | ICD-10-CM | POA: Diagnosis not present

## 2022-01-04 DIAGNOSIS — F03A18 Unspecified dementia, mild, with other behavioral disturbance: Secondary | ICD-10-CM | POA: Diagnosis not present

## 2022-01-09 DIAGNOSIS — G20 Parkinson's disease: Secondary | ICD-10-CM | POA: Diagnosis not present

## 2022-01-09 DIAGNOSIS — G2581 Restless legs syndrome: Secondary | ICD-10-CM | POA: Diagnosis not present

## 2022-01-09 DIAGNOSIS — G44029 Chronic cluster headache, not intractable: Secondary | ICD-10-CM | POA: Diagnosis not present

## 2022-01-09 DIAGNOSIS — I10 Essential (primary) hypertension: Secondary | ICD-10-CM | POA: Diagnosis not present

## 2022-01-12 DIAGNOSIS — R69 Illness, unspecified: Secondary | ICD-10-CM | POA: Diagnosis not present

## 2022-01-12 DIAGNOSIS — Z7722 Contact with and (suspected) exposure to environmental tobacco smoke (acute) (chronic): Secondary | ICD-10-CM | POA: Diagnosis not present

## 2022-01-12 DIAGNOSIS — G2581 Restless legs syndrome: Secondary | ICD-10-CM | POA: Diagnosis not present

## 2022-01-12 DIAGNOSIS — Z8249 Family history of ischemic heart disease and other diseases of the circulatory system: Secondary | ICD-10-CM | POA: Diagnosis not present

## 2022-01-12 DIAGNOSIS — R32 Unspecified urinary incontinence: Secondary | ICD-10-CM | POA: Diagnosis not present

## 2022-01-12 DIAGNOSIS — G20 Parkinson's disease: Secondary | ICD-10-CM | POA: Diagnosis not present

## 2022-01-12 DIAGNOSIS — Z809 Family history of malignant neoplasm, unspecified: Secondary | ICD-10-CM | POA: Diagnosis not present

## 2022-01-12 DIAGNOSIS — I10 Essential (primary) hypertension: Secondary | ICD-10-CM | POA: Diagnosis not present

## 2022-01-12 DIAGNOSIS — E785 Hyperlipidemia, unspecified: Secondary | ICD-10-CM | POA: Diagnosis not present

## 2022-01-12 DIAGNOSIS — G4700 Insomnia, unspecified: Secondary | ICD-10-CM | POA: Diagnosis not present

## 2022-01-21 DIAGNOSIS — G20 Parkinson's disease: Secondary | ICD-10-CM | POA: Diagnosis not present

## 2022-01-21 DIAGNOSIS — G3184 Mild cognitive impairment, so stated: Secondary | ICD-10-CM | POA: Diagnosis not present

## 2022-01-21 DIAGNOSIS — Z79899 Other long term (current) drug therapy: Secondary | ICD-10-CM | POA: Diagnosis not present

## 2022-01-21 DIAGNOSIS — I951 Orthostatic hypotension: Secondary | ICD-10-CM | POA: Diagnosis not present

## 2022-01-23 DIAGNOSIS — G44029 Chronic cluster headache, not intractable: Secondary | ICD-10-CM | POA: Diagnosis not present

## 2022-01-23 DIAGNOSIS — E785 Hyperlipidemia, unspecified: Secondary | ICD-10-CM | POA: Diagnosis not present

## 2022-01-23 DIAGNOSIS — I5020 Unspecified systolic (congestive) heart failure: Secondary | ICD-10-CM | POA: Diagnosis not present

## 2022-01-23 DIAGNOSIS — D519 Vitamin B12 deficiency anemia, unspecified: Secondary | ICD-10-CM | POA: Diagnosis not present

## 2022-01-23 DIAGNOSIS — I10 Essential (primary) hypertension: Secondary | ICD-10-CM | POA: Diagnosis not present

## 2022-01-23 DIAGNOSIS — E559 Vitamin D deficiency, unspecified: Secondary | ICD-10-CM | POA: Diagnosis not present

## 2022-01-23 DIAGNOSIS — E039 Hypothyroidism, unspecified: Secondary | ICD-10-CM | POA: Diagnosis not present

## 2022-01-24 DIAGNOSIS — M6281 Muscle weakness (generalized): Secondary | ICD-10-CM | POA: Diagnosis not present

## 2022-01-24 DIAGNOSIS — R2689 Other abnormalities of gait and mobility: Secondary | ICD-10-CM | POA: Diagnosis not present

## 2022-01-29 DIAGNOSIS — R2689 Other abnormalities of gait and mobility: Secondary | ICD-10-CM | POA: Diagnosis not present

## 2022-01-29 DIAGNOSIS — M6281 Muscle weakness (generalized): Secondary | ICD-10-CM | POA: Diagnosis not present

## 2022-01-30 ENCOUNTER — Ambulatory Visit: Payer: Medicare HMO | Admitting: Internal Medicine

## 2022-02-08 DIAGNOSIS — G20 Parkinson's disease: Secondary | ICD-10-CM | POA: Diagnosis not present

## 2022-02-08 DIAGNOSIS — G4700 Insomnia, unspecified: Secondary | ICD-10-CM | POA: Diagnosis not present

## 2022-02-08 DIAGNOSIS — G44029 Chronic cluster headache, not intractable: Secondary | ICD-10-CM | POA: Diagnosis not present

## 2022-02-08 DIAGNOSIS — G2581 Restless legs syndrome: Secondary | ICD-10-CM | POA: Diagnosis not present

## 2022-02-14 DIAGNOSIS — M6281 Muscle weakness (generalized): Secondary | ICD-10-CM | POA: Diagnosis not present

## 2022-02-14 DIAGNOSIS — R2689 Other abnormalities of gait and mobility: Secondary | ICD-10-CM | POA: Diagnosis not present

## 2022-02-18 DIAGNOSIS — G20 Parkinson's disease: Secondary | ICD-10-CM | POA: Diagnosis not present

## 2022-02-18 DIAGNOSIS — Z79899 Other long term (current) drug therapy: Secondary | ICD-10-CM | POA: Diagnosis not present

## 2022-02-18 DIAGNOSIS — R419 Unspecified symptoms and signs involving cognitive functions and awareness: Secondary | ICD-10-CM | POA: Diagnosis not present

## 2022-02-18 DIAGNOSIS — Z888 Allergy status to other drugs, medicaments and biological substances status: Secondary | ICD-10-CM | POA: Diagnosis not present

## 2022-02-18 DIAGNOSIS — Z885 Allergy status to narcotic agent status: Secondary | ICD-10-CM | POA: Diagnosis not present

## 2022-02-18 DIAGNOSIS — I951 Orthostatic hypotension: Secondary | ICD-10-CM | POA: Diagnosis not present

## 2022-02-24 DIAGNOSIS — R2689 Other abnormalities of gait and mobility: Secondary | ICD-10-CM | POA: Diagnosis not present

## 2022-02-24 DIAGNOSIS — M6281 Muscle weakness (generalized): Secondary | ICD-10-CM | POA: Diagnosis not present

## 2022-03-05 ENCOUNTER — Ambulatory Visit (HOSPITAL_COMMUNITY): Payer: Medicare HMO | Admitting: Psychiatry

## 2022-03-13 DIAGNOSIS — R2689 Other abnormalities of gait and mobility: Secondary | ICD-10-CM | POA: Diagnosis not present

## 2022-03-13 DIAGNOSIS — M6281 Muscle weakness (generalized): Secondary | ICD-10-CM | POA: Diagnosis not present

## 2022-03-20 DIAGNOSIS — R2689 Other abnormalities of gait and mobility: Secondary | ICD-10-CM | POA: Diagnosis not present

## 2022-03-20 DIAGNOSIS — M6281 Muscle weakness (generalized): Secondary | ICD-10-CM | POA: Diagnosis not present

## 2022-04-10 DIAGNOSIS — G44029 Chronic cluster headache, not intractable: Secondary | ICD-10-CM | POA: Diagnosis not present

## 2022-04-10 DIAGNOSIS — G20 Parkinson's disease: Secondary | ICD-10-CM | POA: Diagnosis not present

## 2022-04-10 DIAGNOSIS — I10 Essential (primary) hypertension: Secondary | ICD-10-CM | POA: Diagnosis not present

## 2022-04-14 ENCOUNTER — Encounter: Payer: Self-pay | Admitting: Internal Medicine

## 2022-04-14 DIAGNOSIS — R519 Headache, unspecified: Secondary | ICD-10-CM

## 2022-04-14 DIAGNOSIS — G9340 Encephalopathy, unspecified: Secondary | ICD-10-CM

## 2022-04-14 DIAGNOSIS — G20 Parkinson's disease: Secondary | ICD-10-CM

## 2022-04-22 DIAGNOSIS — E785 Hyperlipidemia, unspecified: Secondary | ICD-10-CM | POA: Diagnosis not present

## 2022-04-22 DIAGNOSIS — I10 Essential (primary) hypertension: Secondary | ICD-10-CM | POA: Diagnosis not present

## 2022-04-22 DIAGNOSIS — E559 Vitamin D deficiency, unspecified: Secondary | ICD-10-CM | POA: Diagnosis not present

## 2022-05-05 ENCOUNTER — Encounter: Payer: Self-pay | Admitting: Diagnostic Neuroimaging

## 2022-05-14 ENCOUNTER — Encounter (HOSPITAL_COMMUNITY): Payer: Self-pay

## 2022-05-14 ENCOUNTER — Emergency Department (HOSPITAL_COMMUNITY)
Admission: EM | Admit: 2022-05-14 | Discharge: 2022-05-14 | Disposition: A | Discharge status: Home / Self Care | Payer: Medicare HMO | Attending: Emergency Medicine | Admitting: Emergency Medicine

## 2022-05-14 ENCOUNTER — Other Ambulatory Visit: Payer: Self-pay

## 2022-05-14 ENCOUNTER — Emergency Department (HOSPITAL_COMMUNITY): Payer: Medicare HMO

## 2022-05-14 DIAGNOSIS — W19XXXA Unspecified fall, initial encounter: Secondary | ICD-10-CM

## 2022-05-14 DIAGNOSIS — I959 Hypotension, unspecified: Secondary | ICD-10-CM | POA: Diagnosis not present

## 2022-05-14 DIAGNOSIS — Z743 Need for continuous supervision: Secondary | ICD-10-CM | POA: Diagnosis not present

## 2022-05-14 DIAGNOSIS — S0990XA Unspecified injury of head, initial encounter: Secondary | ICD-10-CM | POA: Diagnosis not present

## 2022-05-14 DIAGNOSIS — Y92000 Kitchen of unspecified non-institutional (private) residence as  the place of occurrence of the external cause: Secondary | ICD-10-CM | POA: Insufficient documentation

## 2022-05-14 DIAGNOSIS — R55 Syncope and collapse: Secondary | ICD-10-CM | POA: Diagnosis not present

## 2022-05-14 DIAGNOSIS — G20 Parkinson's disease: Secondary | ICD-10-CM | POA: Insufficient documentation

## 2022-05-14 DIAGNOSIS — W01198A Fall on same level from slipping, tripping and stumbling with subsequent striking against other object, initial encounter: Secondary | ICD-10-CM | POA: Diagnosis not present

## 2022-05-14 DIAGNOSIS — R519 Headache, unspecified: Secondary | ICD-10-CM | POA: Insufficient documentation

## 2022-05-14 DIAGNOSIS — R42 Dizziness and giddiness: Secondary | ICD-10-CM | POA: Diagnosis not present

## 2022-05-14 LAB — BASIC METABOLIC PANEL
Anion gap: 7 (ref 5–15)
BUN: 8 mg/dL (ref 8–23)
CO2: 24 mmol/L (ref 22–32)
Calcium: 8.2 mg/dL — ABNORMAL LOW (ref 8.9–10.3)
Chloride: 104 mmol/L (ref 98–111)
Creatinine, Ser: 0.48 mg/dL (ref 0.44–1.00)
GFR, Estimated: >60 mL/min (ref >60)
Glucose, Bld: 109 mg/dL — ABNORMAL HIGH (ref 70–99)
Potassium: 3.5 mmol/L (ref 3.5–5.1)
Sodium: 135 mmol/L (ref 135–145)

## 2022-05-14 LAB — CBC
HCT: 34.4 % — ABNORMAL LOW (ref 36.0–46.0)
Hemoglobin: 11.8 g/dL — ABNORMAL LOW (ref 12.0–15.0)
MCH: 32.9 pg (ref 26.0–34.0)
MCHC: 34.3 g/dL (ref 30.0–36.0)
MCV: 95.8 fL (ref 80.0–100.0)
Platelets: 203 10*3/uL (ref 150–400)
RBC: 3.59 MIL/uL — ABNORMAL LOW (ref 3.87–5.11)
RDW: 12.4 % (ref 11.5–15.5)
WBC: 7 10*3/uL (ref 4.0–10.5)
nRBC: 0 % (ref 0.0–0.2)

## 2022-05-14 LAB — URINALYSIS, ROUTINE W REFLEX MICROSCOPIC
Bilirubin Urine: NEGATIVE
Glucose, UA: NEGATIVE mg/dL
Hgb urine dipstick: NEGATIVE
Ketones, ur: NEGATIVE mg/dL
Leukocytes,Ua: NEGATIVE
Nitrite: NEGATIVE
Protein, ur: NEGATIVE mg/dL
Specific Gravity, Urine: 1.002 — ABNORMAL LOW (ref 1.005–1.030)
pH: 7 (ref 5.0–8.0)

## 2022-05-14 LAB — CBG MONITORING, ED: Glucose-Capillary: 90 mg/dL (ref 70–99)

## 2022-05-14 MED ORDER — METOPROLOL TARTRATE 25 MG PO TABS: 25.0000 mg | ORAL_TABLET | Freq: Two times a day (BID) | ORAL | 11 refills | Status: DC

## 2022-05-14 MED ORDER — ACETAMINOPHEN 500 MG PO TABS
1000.0000 mg | ORAL_TABLET | Freq: Once | ORAL | Status: DC
  Filled 2022-05-14: qty 2

## 2022-05-14 NOTE — ED Provider Notes (Signed)
[facility] DEPT Provider Note   CSN: [id number] Arrival date & time: 05/14/22  1142     History  Chief Complaint  Patient presents with   Near Syncope    Shannon Sutton is a 76 y.o. female with PMHx of Parkinson's disease, anxiety, MDD, OSA coming to the ED via EMS after a fall.  Patient is presenting after a fall from her assisted living facility.  She has a history of Parkinson's disease diagnosed 4 years ago and has struggled with some lightheadedness recently.  She had some medication adjustment by her Warnell Forester a couple of days ago and she endorses increased lightheadedness since then.  Before the fall the patient was reaching in the refrigerator to grab some food and when she got up she fell to the floor.  States apart from the lightheadedness she feels fine.  She has a headache but that is chronic in nature not out of the ordinary.  She is denying any vision changes or other acute complaints.   Near Syncope This is a new problem. The current episode started 3 to 5 hours ago. The problem has been gradually improving. Pertinent negatives include no chest pain, no abdominal pain and no shortness of breath. The symptoms are aggravated by bending. The symptoms are relieved by rest. She has tried nothing for the symptoms.       Home Medications  Meds (verified with daughter at bedside): Lipitor 40 mg, Carbidopa-Levodopa-Entacapone 100 mg, metoprolol titrate 25 mg BID, Quetiapine 25 mg qhs, Ropinirole 2 mg qhs, Zoloft 25 mg qd, Trazodone 50 mg qd.   Allergies    Crestor [rosuvastatin calcium], Cymbalta [duloxetine hcl], Effexor xr [venlafaxine hcl er], Morphine and related, Pravastatin, Promethazine-codeine, and Rizatriptan    Review of Systems   Review of Systems  Constitutional:  Negative for activity change and appetite change.  HENT:  Negative for sinus pressure and sinus pain.   Eyes:  Negative for pain and redness.  Respiratory:  Negative  for cough, choking and shortness of breath.   Cardiovascular:  Positive for near-syncope. Negative for chest pain and leg swelling.  Gastrointestinal:  Negative for abdominal distention, abdominal pain, constipation and diarrhea.  Endocrine: Negative for polyphagia and polyuria.  Genitourinary:  Negative for difficulty urinating and dysuria.  Musculoskeletal:  Negative for arthralgias and back pain.  Skin:  Negative for color change and pallor.  Allergic/Immunologic: Negative for environmental allergies and food allergies.  Neurological:  Positive for light-headedness. Negative for dizziness and facial asymmetry.  Hematological:  Negative for adenopathy. Does not bruise/bleed easily.  Psychiatric/Behavioral:  Negative for agitation and behavioral problems.     Physical Exam Updated Vital Signs BP 127/74 (BP Location: Right Arm)   Pulse 62   Temp 97.8 F (36.6 C) (Oral)   Resp 13   Ht 5' 0.5" (1.537 m)   Wt 52.2 kg   SpO2 99%   BMI 22.09 kg/m  Physical Exam:  General: NAD, elderly female laying in bed comfortably HENT: NCAT, MMM CV: RRR, good radial pulses Resp:CTAB, no wheeze, rhonchi or rales Abd:No TTP, normal bowel sounds MSK:No asymmetry, good strength in all extremities Skin:Minor abrasion on left knee Neuro:alert and oriented x4. CNII-XII grossly normal. Strength normal, sensation unchanged. Some difficulty with recall but overall good historian Psych: normal mood and normal affect.   ED Results / Procedures / Treatments   Labs (all labs ordered are listed, but only abnormal results are displayed) Labs Reviewed  BASIC METABOLIC PANEL - Abnormal;  Notable for the following components:      Result Value   Glucose, Bld 109 (*)    Calcium 8.2 (*)    All other components within normal limits  CBC - Abnormal; Notable for the following components:   RBC 3.59 (*)    Hemoglobin 11.8 (*)    HCT 34.4 (*)    All other components within normal limits  URINALYSIS, ROUTINE W  REFLEX MICROSCOPIC - Abnormal; Notable for the following components:   Specific Gravity, Urine 1.002 (*)    All other components within normal limits  CBG MONITORING, ED    EKG EKG Interpretation  Date/Time:  Wednesday May 14 2022 12:09:17 EDT Ventricular Rate:  62 PR Interval:  193 QRS Duration: 89 QT Interval:  407 QTC Calculation: 414 R Axis:   63 Text Interpretation: Sinus rhythm Anteroseptal infarct, age indeterminate Minimal ST elevation, inferior leads new compared to 04/19/2008 Confirmed by Blanchie Dessert [id number]) on 05/14/2022 1:12:56 PM  Radiology CT Head Wo Contrast  Result Date: 05/14/2022 CLINICAL DATA:  Head trauma, moderate to severe. Dizziness with lightheadedness. Syncopal episode without loss of consciousness. EXAM: CT HEAD WITHOUT CONTRAST TECHNIQUE: Contiguous axial images were obtained from the base of the skull through the vertex without intravenous contrast. RADIATION DOSE REDUCTION: This exam was performed according to the departmental dose-optimization program which includes automated exposure control, adjustment of the mA and/or kV according to patient size and/or use of iterative reconstruction technique. COMPARISON:  Outside head CT without report from [facility] imaging 01/18/2021. MRI brain 10/31/2020. FINDINGS: Brain: There is no evidence of acute intracranial hemorrhage, mass lesion, brain edema or extra-axial fluid collection. Stable mild atrophy with prominence of the ventricles and subarachnoid spaces. Stable mild chronic small vessel ischemic changes in the periventricular white matter. There is no CT evidence of acute cortical infarction. Vascular: Diffuse intracranial vascular calcifications. No hyperdense vessel identified. Skull: Negative for fracture or focal lesion. Sinuses/Orbits: The visualized paranasal sinuses and mastoid air cells are clear. No orbital abnormalities are seen. Other: None. IMPRESSION: No acute intracranial or calvarial  findings. Stable mild chronic periventricular white matter disease. Electronically Signed   By: Richardean Sale M.D.   On: 05/14/2022 14:27    Procedures NA  Medications Ordered in ED Medications  acetaminophen (TYLENOL) tablet 1,000 mg (1,000 mg Oral Not Given 05/14/22 1230)    ED Course/ Medical Decision Making/ A&P Clinical Course as of 05/14/22 1456  Wed May 14, 2022  1337 BP: 122/73 [GK]    Clinical Course User Index [GK] Idamae Schuller, MD                           Medical Decision Making Patient presented after a fall after experiencing lightheadedness.  Differential diagnosis includes orthostatic hypotension vs vasovagal syncope vs cardiac arrhythmia.  Pt is more prone to fall given her hx of parkinson's. She states she was having some lightheadedness that was manageable but some of her medications were changed a couple of days ago that led to increased lightheadedness. Most notable of these was change from Propanolol 20 mg BID to Metoprolol 50 mg BID. This appears to be contributing to her orthostatic hypotension. Orthostatic vitals were obtained and they were positive in the ED.  Given fall with impact on head in an elderly pt, CT head was ordered that did not show any acute findings. Labs otherwise without any sign of infection or metabolic derangements. Plan to discharge metoprolol dose  reduction and close PCP follow up.  -Reduce Metoprolol to 25 mg BID -Gave pt counseling on fall prevention with slow position changes. Gave easy to read hand out on fall prevention. -PCP follow up in 2-3 days to ensure pt stable on current regimen as her regimen was changed on 07/31  Amount and/or Complexity of Data Reviewed External Data Reviewed: labs, radiology, ECG and notes.    Details: Reviewed pt's notes from neurology, PCP and reviewed prior labs. Labs: ordered. Decision-making details documented in ED Course.    Details: CBC, BMP, UA, CBG Radiology: ordered and independent interpretation  performed. Decision-making details documented in ED Course.    Details: CT head ordered. ECG/medicine tests: ordered and independent interpretation performed. Decision-making details documented in ED Course.  Risk Prescription drug management.           Final Clinical Impression(s) / ED Diagnoses Final diagnoses:  Fall, initial encounter    Rx / DC Orders ED Discharge Orders     None      Idamae Schuller, MD Tillie Rung. [facility] Internal Medicine Residency, PGY-2   Idamae Schuller, MD 05/14/22 7340    Blanchie Dessert, MD 05/15/22 [id number]

## 2022-05-14 NOTE — ED Notes (Signed)
Pt stated she felt light headed at 0 min standing.

## 2022-05-14 NOTE — ED Provider Triage Note (Addendum)
Emergency Medicine Provider Triage Evaluation Note  CHYNNA BUERKLE , a 76 y.o. female  was evaluated in triage.  Pt complains of lightheadedness. Pt has hx of Parkinson, recently had some medication changes.  Today she felt lightheadedness upon standing, and fell, striking her L side of head.  No LOC, no nausea.  Endorse headache, pain to L index finger.  No cp, sob, abd pain, dysuria.  Complain of dry mouth  Review of Systems  Positive: As above Negative: As above  Physical Exam  BP 117/64 (BP Location: Left Arm)   Pulse 63   Temp 97.9 F (36.6 C) (Oral)   Resp 17   Ht 5' 0.5" (1.537 m)   Wt 52.2 kg   SpO2 100%   BMI 22.09 kg/m  Gen:   Awake, no distress   Resp:  Normal effort  MSK:   Moves extremities without difficulty  Other:    Medical Decision Making  Medically screening exam initiated at 12:08 PM.  Appropriate orders placed.  CHARELLE PETRAKIS was informed that the remainder of the evaluation will be completed by another provider, this initial triage assessment does not replace that evaluation, and the importance of remaining in the ED until their evaluation is complete.   Suspect medication side effect causing near syncope   Domenic Moras, PA-C 05/14/22 1216

## 2022-05-14 NOTE — ED Triage Notes (Signed)
Pt arrives via EMS from home. Pt reports she has been having some intermittent dizziness and feeling light headed. Pt states she was going to the kitchen and almost passed out. Pt did strike her head on a cabinet, no loc, no blood thinners. Pt denies cp or sob. Pt oriented x 4.

## 2022-05-14 NOTE — Discharge Instructions (Addendum)
You presented today after a fall.  It appears your fall was secondary to your blood pressure dropping with changing your position too fast.  This is called orthostatic hypotension.  We repeated your blood pressure in different positions slowly over pressure decreased significantly with standing and you felt dizzy.  We imaged your head and did not see any trauma from the fall.  I encourage you to follow-up with your primary care doctor soon as possible.  You recently started metoprolol instead of the propanolol which may be contributing to this.  We will decrease your metoprolol to half of your current dose.  I encourage you to change positions slowly to avoid that happening again.  I am including an easy to read fall prevention diet that should help prevent future falls.

## 2022-05-15 ENCOUNTER — Institutional Professional Consult (permissible substitution): Payer: Medicare HMO | Admitting: Diagnostic Neuroimaging

## 2022-05-30 ENCOUNTER — Ambulatory Visit (HOSPITAL_BASED_OUTPATIENT_CLINIC_OR_DEPARTMENT_OTHER): Payer: Medicare HMO | Admitting: Family

## 2022-06-17 ENCOUNTER — Institutional Professional Consult (permissible substitution): Payer: Medicare HMO | Admitting: Diagnostic Neuroimaging

## 2022-07-04 DIAGNOSIS — H524 Presbyopia: Secondary | ICD-10-CM | POA: Diagnosis not present

## 2022-07-31 ENCOUNTER — Encounter: Payer: Self-pay | Admitting: Gastroenterology

## 2022-08-08 ENCOUNTER — Ambulatory Visit
Admission: RE | Admit: 2022-08-08 | Discharge: 2022-08-08 | Disposition: A | Discharge status: Home / Self Care | Payer: Medicare HMO | Source: Ambulatory Visit | Attending: Internal Medicine | Admitting: Internal Medicine

## 2022-08-08 ENCOUNTER — Other Ambulatory Visit: Payer: Self-pay | Admitting: Internal Medicine

## 2022-08-08 DIAGNOSIS — M542 Cervicalgia: Secondary | ICD-10-CM | POA: Diagnosis not present

## 2022-09-15 ENCOUNTER — Ambulatory Visit: Payer: Medicare HMO | Admitting: Diagnostic Neuroimaging

## 2022-09-15 ENCOUNTER — Encounter: Payer: Self-pay | Admitting: Diagnostic Neuroimaging

## 2022-09-15 VITALS — BP 127/75 | HR 89 | Ht 61.0 in | Wt 115.0 lb

## 2022-09-15 DIAGNOSIS — G2581 Restless legs syndrome: Secondary | ICD-10-CM

## 2022-09-15 DIAGNOSIS — G20C Parkinsonism, unspecified: Secondary | ICD-10-CM

## 2022-09-15 DIAGNOSIS — M542 Cervicalgia: Secondary | ICD-10-CM

## 2022-09-15 NOTE — Progress Notes (Signed)
Chief Complaint  Patient presents with   Headache    Rm 6 with daughter shelby  PT is well, has been having daily headaches since about September.  Did have a fall back in Aug, CT was clear     History of Present Illness:  UPDATE (09/15/22, VRP): Since last visit, doing well, until she stopped her carb/levo in Oct 2023. No back on it. Also having some issues with bilateral neck pain (muscle spasms) and posterior HA.   UPDATE (12/17/21, VRP): Since last visit, now living at [facility] assisted [facility]. Has been seeing [facility] neurology (Dr. Linus Mako) and Dr. Casimiro Needle (psychiatry). Having more dizziness today, with low BP reading.   UPDATE (09/19/20, VRP): Since last visit, had 2nd opinion with Dr. Linus Mako and PA Nyoka Cowden; now on carb / levo 1 tab 4x per day. Also on ropinirole 0.'5mg'$  three times a day (sometimes 4x per day; helping with internal restlessness). Anxiety, insomnia continue. Dizziness (lightheadedness) intermittent; some low BP reading (90/60) but most of the time ok.   UPDATE (11/01/2018, VRP): She continues to have blurred vision, headaches and tremor of hands. Blurred vision is worse with bright lights. She is having right sided temporal and occipital headaches. They are throbbing with photosensitivity and nausea. She kept BP log and readings were normal. No migraine diary. Resting tremor is worse in the evening. L> R hand. PT in 08/2018 did not help much. She is going to GYM 3 times a week, both cardio and light weights. She continues to [employer] about being forgetful. She is constantly hoarse and has intermittent trouble swallowing saliva. No trouble swallowing food or liquids. No falls. She is taking ropinirole 0.'25mg'$  in the am and 0.50 in the pm. She is tolerating well. She did not increase dose due to dizziness. She feels that symptoms are no worse since last visit in 08/2018 but no better.    UPDATE (08/27/18, VRP): Since last visit, symptoms are progressing.  Now patient having more  problems with lightheadedness, dizziness, gait and balance difficulty, tremor, fatigue.  Also with more confusion, difficulty swallowing, swallowing difficulty hoarse voice, difficulty with handwriting.   Also reports increasing photosensitivity and blurred vision.  She does have history of headaches since age 27 years old with right temporal throbbing painful sensation associate with photophobia and nausea.   PRIOR HPI (12/02/17): 76 year old right-handed female here for evaluation of tremor, weakness, blurred vision, headaches, fatigue.  Evaluate for Parkinson symptoms.   Patient reports intermittent weakness and shaky sensation with heart racing feeling since February 2018.  Patient also having some intermittent tremor, mainly with certain postures and actions, mainly in her left hand, dating back to January 2017.  She had evaluation with movement disorder specialist Dr. Carles Collet in 2017, who felt that patient did not have Parkinson's disease at that time.   Long history of loss of smell.  Patient feels like her handwriting is worsening.  Patient also concerned about her family history of Parkinson's disease in her paternal grandfather and maternal aunt.   Patient also having issues with depression, confusion, memory loss, balance problems.   Patient has intermittent headaches with pressure sensation.  No nausea or vomiting.  No photophobia or phonophobia.   Patient has been struggling with her vision and has gone through "$2000 worth" of different glasses and eye exams without benefit in her vision.       Observations/Objective:  GENERAL EXAM/CONSTITUTIONAL: Vitals:  Vitals:   09/15/22 0914  BP: 127/75  Pulse: 89  Weight:  115 lb (52.2 kg)  Height: '5\' 1"'$  (1.549 m)   Body mass index is 21.73 kg/m. Wt Readings from Last 3 Encounters:  09/15/22 115 lb (52.2 kg)  05/14/22 115 lb (52.2 kg)  12/17/21 114 lb 12.8 oz (52.1 kg)   No data found.  Patient is in no distress; well developed,  nourished and groomed; neck is supple  CARDIOVASCULAR: Examination of carotid arteries is normal; no carotid bruits Regular rate and rhythm, no murmurs Examination of peripheral vascular system by observation and palpation is normal  EYES: Ophthalmoscopic exam of optic discs and posterior segments is normal; no papilledema or hemorrhages No results found.  MUSCULOSKELETAL: Gait, strength, tone, movements noted in Neurologic exam below  NEUROLOGIC: MENTAL STATUS:      No data to display         awake, alert, oriented to person, place and time recent and remote memory intact normal attention and concentration language fluent, comprehension intact, naming intact fund of knowledge appropriate  CRANIAL NERVE:  2nd - no papilledema on fundoscopic exam 2nd, 3rd, 4th, 6th - pupils equal and reactive to light, visual fields full to confrontation, extraocular muscles intact, no nystagmus 5th - facial sensation symmetric 7th - facial strength symmetric 8th - hearing intact 9th - palate elevates symmetrically, uvula midline 11th - shoulder shrug symmetric 12th - tongue protrusion midline MASKED FACIES  MOTOR:  normal bulk and tone, full strength in the BUE, BLE; NO BRADYKINESIA; NO TREMOR; MINIMAL INCREASED TONE IN LUE  SENSORY:  normal and symmetric to light touch, temperature, vibration  COORDINATION:  finger-nose-finger, fine finger movements normal  REFLEXES:  deep tendon reflexes present and symmetric  GAIT/STATION:  narrow based gait; SMOOTH STRIDE AND TURNING   04/08/17 MRI brain - The pituitary appears mildly atrophic, but there are no features suggestive of microadenoma, macroadenoma, or parasellar mass. - Moderate atrophy and small vessel disease. No hypothalamic abnormality. - No acute or focal intracranial findings. Normal postcontrast appearance of the brain.  12/30/18 DATscan - Subtle truncation of the posterior RIGHT striata compared to the LEFT. While  this could be an anatomic variation, the basal ganglia are symmetric RIGHT to LEFT on comparison MRI. Findings could indicate Parkinson's syndrome pathology.   Assessment and Plan:  76 y.o.   Dx:  1. Parkinsonism, unspecified Parkinsonism type   2. Restless leg syndrome   3. Neck pain      NECK PAIN --> (muscle spasm, arthritis, parkinsonism symptoms) - consider massage therapy, PT, ibuprofen, tylenol as needed  PARKINSONISM (postural instability / gait difficulty subtype vs other atypical parkinsonism) - continue ropinirole '4mg'$  daily  - continue carb/levo 25/100 1 tab daily - may consider rasagiline / selegiline or amantadine in future - continue physical therapy for gait and balance training - improve nutrition, depression and anxiety treatment - monitor BP at home  ANXIETY / INSOMNIA / ADJUSTMENT DISORDER - per Dr. 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      Penni Bombard, MD 16/10/958, 45:40 AM Certified in Neurology, Neurophysiology and Neuroimaging  [facility] [address], [place], [place] [address] [phone]

## 2022-09-15 NOTE — Patient Instructions (Signed)
  NECK PAIN - muscle spasm, arthritis - consider massage therapy, PT, ibuprofen, tylenol as needed

## 2022-10-05 DIAGNOSIS — R4182 Altered mental status, unspecified: Secondary | ICD-10-CM | POA: Diagnosis not present

## 2022-10-26 ENCOUNTER — Emergency Department (HOSPITAL_COMMUNITY)
Admission: EM | Admit: 2022-10-26 | Discharge: 2022-10-26 | Disposition: A | Discharge status: Home / Self Care | Payer: Medicare HMO | Attending: Emergency Medicine | Admitting: Emergency Medicine

## 2022-10-26 ENCOUNTER — Encounter (HOSPITAL_COMMUNITY): Payer: Self-pay

## 2022-10-26 ENCOUNTER — Other Ambulatory Visit: Payer: Self-pay

## 2022-10-26 DIAGNOSIS — Z79899 Other long term (current) drug therapy: Secondary | ICD-10-CM | POA: Insufficient documentation

## 2022-10-26 DIAGNOSIS — R030 Elevated blood-pressure reading, without diagnosis of hypertension: Secondary | ICD-10-CM | POA: Diagnosis not present

## 2022-10-26 DIAGNOSIS — I10 Essential (primary) hypertension: Secondary | ICD-10-CM | POA: Diagnosis not present

## 2022-10-26 DIAGNOSIS — I159 Secondary hypertension, unspecified: Secondary | ICD-10-CM

## 2022-10-26 LAB — URINALYSIS, ROUTINE W REFLEX MICROSCOPIC
Bilirubin Urine: NEGATIVE
Glucose, UA: NEGATIVE mg/dL
Hgb urine dipstick: NEGATIVE
Ketones, ur: NEGATIVE mg/dL
Leukocytes,Ua: NEGATIVE
Nitrite: NEGATIVE
Protein, ur: NEGATIVE mg/dL
Specific Gravity, Urine: 1.019 (ref 1.005–1.030)
pH: 5 (ref 5.0–8.0)

## 2022-10-26 LAB — COMPREHENSIVE METABOLIC PANEL
ALT: 16 U/L (ref 0–44)
AST: 19 U/L (ref 15–41)
Albumin: 4.4 g/dL (ref 3.5–5.0)
Alkaline Phosphatase: 63 U/L (ref 38–126)
Anion gap: 11 (ref 5–15)
BUN: 17 mg/dL (ref 8–23)
CO2: 22 mmol/L (ref 22–32)
Calcium: 9.2 mg/dL (ref 8.9–10.3)
Chloride: 100 mmol/L (ref 98–111)
Creatinine, Ser: 0.56 mg/dL (ref 0.44–1.00)
GFR, Estimated: >60 mL/min (ref >60)
Glucose, Bld: 113 mg/dL — ABNORMAL HIGH (ref 70–99)
Potassium: 3.6 mmol/L (ref 3.5–5.1)
Sodium: 133 mmol/L — ABNORMAL LOW (ref 135–145)
Total Bilirubin: 0.4 mg/dL (ref 0.3–1.2)
Total Protein: 6.9 g/dL (ref 6.5–8.1)

## 2022-10-26 LAB — CBC WITH DIFFERENTIAL/PLATELET
Abs Immature Granulocytes: 0.03 10*3/uL (ref 0.00–0.07)
Basophils Absolute: 0.1 10*3/uL (ref 0.0–0.1)
Basophils Relative: 1 %
Eosinophils Absolute: 0.2 10*3/uL (ref 0.0–0.5)
Eosinophils Relative: 2 %
HCT: 39.8 % (ref 36.0–46.0)
Hemoglobin: 13.4 g/dL (ref 12.0–15.0)
Immature Granulocytes: 0 %
Lymphocytes Relative: 17 %
Lymphs Abs: 1.7 10*3/uL (ref 0.7–4.0)
MCH: 31.8 pg (ref 26.0–34.0)
MCHC: 33.7 g/dL (ref 30.0–36.0)
MCV: 94.5 fL (ref 80.0–100.0)
Monocytes Absolute: 0.7 10*3/uL (ref 0.1–1.0)
Monocytes Relative: 7 %
Neutro Abs: 7.1 10*3/uL (ref 1.7–7.7)
Neutrophils Relative %: 73 %
Platelets: 264 10*3/uL (ref 150–400)
RBC: 4.21 MIL/uL (ref 3.87–5.11)
RDW: 12.4 % (ref 11.5–15.5)
WBC: 9.7 10*3/uL (ref 4.0–10.5)
nRBC: 0 % (ref 0.0–0.2)

## 2022-10-26 MED ORDER — SODIUM CHLORIDE 0.9 % IV BOLUS
500.0000 mL | Freq: Once | INTRAVENOUS | Status: AC
  Administered 2022-10-26: 500 mL via INTRAVENOUS

## 2022-10-26 MED ORDER — HYDRALAZINE HCL 20 MG/ML IJ SOLN
10.0000 mg | Freq: Once | INTRAMUSCULAR | Status: AC
  Administered 2022-10-26: 10 mg via INTRAVENOUS
  Filled 2022-10-26: qty 1

## 2022-10-26 MED ORDER — LABETALOL HCL 5 MG/ML IV SOLN
10.0000 mg | Freq: Once | INTRAVENOUS | Status: AC
  Administered 2022-10-26: 10 mg via INTRAVENOUS
  Filled 2022-10-26: qty 4

## 2022-10-26 MED ORDER — METOPROLOL TARTRATE 50 MG PO TABS: 50.0000 mg | ORAL_TABLET | Freq: Two times a day (BID) | ORAL | 0 refills | Status: DC

## 2022-10-26 NOTE — ED Provider Notes (Signed)
[facility] EMERGENCY DEPARTMENT Provider Note   CSN: [id number] Arrival date & time: 10/26/22  1736     History  Chief Complaint  Patient presents with   Hypertension    Shannon Sutton is a 77 y.o. female.  HPI   77 year old female with past medical history of anxiety, HTN, HLD who presents to the emergency department for blood pressure.  Patient used to be on medication for her high blood pressure however this was stopped.  Since then she has had normal blood pressure control.  Patient is unsure what her normal systolic blood pressure is.  Patient at baseline has full body pain including head.  She states that this current head discomfort is baseline for her, no acute change, no neck pain or stiffness.  No vision change.  She denies any chest pain, back pain or swelling of her lower extremities.  States her health is otherwise at baseline.  Home Medications Prior to Admission medications   Medication Sig Start Date End Date Taking? Authorizing Provider  metoprolol tartrate (LOPRESSOR) 50 MG tablet Take 1 tablet (50 mg total) by mouth 2 (two) times daily. 10/26/22  Yes Andra Matsuo M, DO  carbidopa-levodopa (SINEMET IR) 25-100 MG tablet Take 1 tablet by mouth daily.    [provider]  Melatonin 10 MG TABS Take 5-10 mg by mouth at bedtime.    [provider]  QUEtiapine (SEROQUEL) 50 MG tablet Take 50 mg by mouth 2 (two) times daily. 11/22/21   [provider]  rOPINIRole (REQUIP) 4 MG tablet Take 1 mg by mouth daily.    [provider]  sertraline (ZOLOFT) 100 MG tablet Take 100 mg by mouth in the morning. 08/21/22   [provider]  traZODone (DESYREL) 150 MG tablet Take 1 tablet (150 mg total) by mouth at bedtime as needed for sleep. Patient taking differently: Take 50 mg by mouth at bedtime. 2 1/2 tab, 150 MG at bedtime, 11/19/21   Parks Ranger, DO      Allergies    Crestor Jaci Standard calcium], Cymbalta  [duloxetine hcl], Effexor xr [venlafaxine hcl er], Morphine and related, Pravastatin, Promethazine-codeine, and Rizatriptan    Review of Systems   Review of Systems  Constitutional:  Negative for fever.  Respiratory:  Negative for shortness of breath.   Cardiovascular:  Negative for chest pain.  Gastrointestinal:  Negative for abdominal pain, diarrhea and vomiting.  Skin:  Negative for rash.  Neurological:  Positive for headaches.    Physical Exam Updated Vital Signs BP (!) 190/98   Pulse (!) 103   Temp 99 F (37.2 C) (Oral)   Resp 20   Ht '5\' 1"'$  (1.549 m)   Wt 52.2 kg   SpO2 97%   BMI 21.74 kg/m  Physical Exam Vitals and nursing note reviewed.  Constitutional:      Appearance: Normal appearance.     Comments: Very anxious  HENT:     Head: Normocephalic.     Mouth/Throat:     Mouth: Mucous membranes are moist.  Cardiovascular:     Rate and Rhythm: Tachycardia present.  Pulmonary:     Effort: Pulmonary effort is normal. No respiratory distress.  Abdominal:     Palpations: Abdomen is soft.     Tenderness: There is no abdominal tenderness.  Musculoskeletal:        General: No swelling.     Cervical back: No rigidity or tenderness.  Skin:    General: Skin is warm.  Neurological:     Mental Status: She is alert and oriented to person, place, and time. Mental status is at baseline.  Psychiatric:     Comments: Very anxious     ED Results / Procedures / Treatments   Labs (all labs ordered are listed, but only abnormal results are displayed) Labs Reviewed  COMPREHENSIVE METABOLIC PANEL - Abnormal; Notable for the following components:      Result Value   Sodium 133 (*)    Glucose, Bld 113 (*)    All other components within normal limits  CBC WITH DIFFERENTIAL/PLATELET  URINALYSIS, ROUTINE W REFLEX MICROSCOPIC    EKG EKG Interpretation  Date/Time:  'Sunday October 26 2022 17:44:58 EST Ventricular Rate:  109 PR Interval:  158 QRS Duration: 86 QT  Interval:  328 QTC Calculation: 441 R Axis:   43 Text Interpretation: Sinus tachycardia Otherwise normal ECG When compared with ECG of 14-May-2022 12:09, PREVIOUS ECG IS PRESENT Confirmed by Kynsleigh Westendorf ([id number]) on 10/26/2022 8:37:10 PM  Radiology No results found.  Procedures Procedures    Medications Ordered in ED Medications  sodium chloride 0.9 % bolus 500 mL (0 mLs Intravenous Stopped 10/26/22 2111)  labetalol (NORMODYNE) injection 10 mg (10 mg Intravenous Given 10/26/22 2025)  hydrALAZINE (APRESOLINE) injection 10 mg (10 mg Intravenous Given 10/26/22 2129)    ED Course/ Medical Decision Making/ A&P                             Medical Decision Making Risk Prescription drug management.   76 year old female presents emergency department with high blood pressure.  On arrival blood pressure is greater than 200 systolic.  EKG shows sinus tachycardia.  Patient denies any chest pain or shortness of breath.  Patient is very anxious and asking to leave.  Blood work is reassuring, no acute abnormalities.  Kidney function is normal.  No indication for troponin in the absence of chest pain/ischemic EKG changes.  Lung sounds are clear, equal bilaterally.  Patient was given a dose of blood pressure medicine with downtrending blood pressure.    On reevaluation patient states that she feels well and baseline.  Blood pressure has down trended and is 188/98 on cycled blood pressure done by myself.  Will plan for oral medication for continued slow reduction of blood pressure over the next couple days.  Instructed for her to follow-up with her primary doctor for reevaluation of blood pressure and new prescription.  Discussed dietary changes and low-salt diet.  Discussed strict return to ED precautions including acutely worsening headache, chest pain, shortness of breath, swelling of her lower extremities.  Patient at this time appears safe and stable for discharge and close outpatient follow up.  Discharge plan and strict return to ED precautions discussed, patient verbalizes understanding and agreement.        Final Clinical Impression(s) / ED Diagnoses Final diagnoses:  Secondary hypertension    Rx / DC Orders ED Discharge Orders          Ordered    metoprolol tartrate (LOPRESSOR) 50 MG tablet  2 times daily        01'$ /14/24 2155              Teofil Maniaci, Alvin Critchley, DO 10/26/22 2215

## 2022-10-26 NOTE — ED Triage Notes (Signed)
Pt brought in by daughter from [facility] for HTN. The staff took the pt's bp and it was 220/110. Pt's daughter states the pt refused to go in EMS. The daughter states the staff said pt was hot to touch, but they didn't take her temp, because they didn't have a thermometer. Pt has a HA and dizziness all the time.

## 2022-10-26 NOTE — Discharge Instructions (Signed)
You have been seen and discharged from the emergency department.  Take prescription as prescribed.  Stay well-hydrated.  Follow-up with your primary provider for further evaluation of blood pressure and new prescription . Take home medications as prescribed. If you have any worsening symptoms or further concerns for your health please return to an emergency department for further evaluation.

## 2022-10-26 NOTE — ED Notes (Addendum)
I informed Johana, PA of pt's elevated bp.

## 2022-10-26 NOTE — ED Provider Triage Note (Signed)
Emergency Medicine Provider Triage Evaluation Note  Shannon Sutton , a 77 y.o. female  was evaluated in triage.  Pt complains of elevated blood pressure.  She is currently living at [facility], they reported she was hypertensive this morning with a pressure of 220/110, this blood pressure was later rechecked and it continue to be high.  Does have an ongoing headache at baseline however reports she feels somewhat upset for being here today.  No chest pain, short of breath due to this incident.  Review of Systems  Positive: Headache, sob Negative: Fever, chest pain  Physical Exam  BP (!) 197/98   Pulse (!) 109   Temp 99 F (37.2 C) (Oral)   Resp 16   Ht '5\' 1"'$  (1.549 m)   Wt 52.2 kg   SpO2 99%   BMI 21.74 kg/m  Gen:   Awake, no distress   Resp:  Normal effort  MSK:   Moves extremities without difficulty  Other:    Medical Decision Making  Medically screening exam initiated at 6:00 PM.  Appropriate orders placed.  Shannon Sutton was informed that the remainder of the evaluation will be completed by another provider, this initial triage assessment does not replace that evaluation, and the importance of remaining in the ED until their evaluation is complete.     Janeece Fitting, PA-C 10/26/22 1803

## 2022-11-04 NOTE — Progress Notes (Signed)
This encounter was created in error - please disregard.

## 2023-02-25 DIAGNOSIS — G20A1 Parkinson's disease without dyskinesia, without mention of fluctuations: Secondary | ICD-10-CM | POA: Diagnosis not present

## 2023-04-21 ENCOUNTER — Encounter (HOSPITAL_BASED_OUTPATIENT_CLINIC_OR_DEPARTMENT_OTHER): Payer: Self-pay | Admitting: Emergency Medicine

## 2023-04-21 ENCOUNTER — Other Ambulatory Visit: Payer: Self-pay

## 2023-04-21 ENCOUNTER — Emergency Department (HOSPITAL_BASED_OUTPATIENT_CLINIC_OR_DEPARTMENT_OTHER): Payer: Medicare HMO

## 2023-04-21 ENCOUNTER — Emergency Department (HOSPITAL_BASED_OUTPATIENT_CLINIC_OR_DEPARTMENT_OTHER)
Admission: EM | Admit: 2023-04-21 | Discharge: 2023-04-22 | Disposition: A | Discharge status: Home / Self Care | Payer: Medicare HMO | Attending: Emergency Medicine | Admitting: Emergency Medicine

## 2023-04-21 DIAGNOSIS — S065X0A Traumatic subdural hemorrhage without loss of consciousness, initial encounter: Secondary | ICD-10-CM | POA: Diagnosis not present

## 2023-04-21 DIAGNOSIS — W010XXA Fall on same level from slipping, tripping and stumbling without subsequent striking against object, initial encounter: Secondary | ICD-10-CM | POA: Insufficient documentation

## 2023-04-21 DIAGNOSIS — S40011A Contusion of right shoulder, initial encounter: Secondary | ICD-10-CM

## 2023-04-21 DIAGNOSIS — M19011 Primary osteoarthritis, right shoulder: Secondary | ICD-10-CM | POA: Diagnosis not present

## 2023-04-21 DIAGNOSIS — W19XXXA Unspecified fall, initial encounter: Secondary | ICD-10-CM

## 2023-04-21 DIAGNOSIS — S0990XA Unspecified injury of head, initial encounter: Secondary | ICD-10-CM | POA: Diagnosis not present

## 2023-04-21 DIAGNOSIS — Z043 Encounter for examination and observation following other accident: Secondary | ICD-10-CM | POA: Diagnosis not present

## 2023-04-21 DIAGNOSIS — S065XAA Traumatic subdural hemorrhage with loss of consciousness status unknown, initial encounter: Secondary | ICD-10-CM

## 2023-04-21 NOTE — ED Triage Notes (Signed)
Unwitnessed fall Fall in kitchen, bumped head, right arm pain From assisted living brought in by daughter. Was evaluated by emt who suggested getting her arm checked out. No blood thinner, denies loc

## 2023-04-22 ENCOUNTER — Emergency Department (HOSPITAL_BASED_OUTPATIENT_CLINIC_OR_DEPARTMENT_OTHER): Payer: Medicare HMO

## 2023-04-22 DIAGNOSIS — S065X0A Traumatic subdural hemorrhage without loss of consciousness, initial encounter: Secondary | ICD-10-CM | POA: Diagnosis not present

## 2023-04-22 DIAGNOSIS — G9389 Other specified disorders of brain: Secondary | ICD-10-CM | POA: Diagnosis not present

## 2023-04-22 DIAGNOSIS — R220 Localized swelling, mass and lump, head: Secondary | ICD-10-CM | POA: Diagnosis not present

## 2023-04-22 MED ORDER — HYDROCODONE-ACETAMINOPHEN 5-325 MG PO TABS: 1.0000 | ORAL_TABLET | Freq: Four times a day (QID) | ORAL | 0 refills | Status: DC | PRN

## 2023-04-22 MED ORDER — HYDROCODONE-ACETAMINOPHEN 5-325 MG PO TABS
1.0000 | ORAL_TABLET | Freq: Once | ORAL | Status: AC
  Administered 2023-04-22: 1 via ORAL
  Filled 2023-04-22: qty 1

## 2023-04-22 MED ORDER — DROPERIDOL 2.5 MG/ML IJ SOLN: 2.5000 mg | Freq: Once | INTRAMUSCULAR | Status: DC

## 2023-04-22 NOTE — ED Provider Notes (Signed)
[facility] EMERGENCY DEPARTMENT AT [facility] Provider Note   CSN: [id number] Arrival date & time: 04/21/23  2324     History  Chief Complaint  Patient presents with   Shannon Sutton Shannon Sutton is a 77 y.o. female.  Patient is a 77 year old female with past medical history of restless legs and ataxia.  Patient presenting today for evaluation of a fall.  She got up this evening to go to the bathroom, then lost her balance and fell.  She struck her head on the floor and injured her right shoulder.  There was no loss of consciousness, but she does have a hematoma to the right side of her scalp.  She denies any neck pain.  She does report some headache, but this is not uncommon for her.  The history is provided by the patient.       Home Medications Prior to Admission medications   Medication Sig Start Date End Date Taking? Authorizing Provider  carbidopa-levodopa (SINEMET IR) 25-100 MG tablet Take 1 tablet by mouth daily.    [provider]  Melatonin 10 MG TABS Take 5-10 mg by mouth at bedtime.    [provider]  metoprolol tartrate (LOPRESSOR) 50 MG tablet Take 1 tablet (50 mg total) by mouth 2 (two) times daily. 10/26/22   Horton, Clabe Seal, DO  QUEtiapine (SEROQUEL) 50 MG tablet Take 50 mg by mouth 2 (two) times daily. 11/22/21   [provider]  rOPINIRole (REQUIP) 4 MG tablet Take 1 mg by mouth daily.    [provider]  sertraline (ZOLOFT) 100 MG tablet Take 100 mg by mouth in the morning. 08/21/22   [provider]  traZODone (DESYREL) 150 MG tablet Take 1 tablet (150 mg total) by mouth at bedtime as needed for sleep. Patient taking differently: Take 50 mg by mouth at bedtime. 2 1/2 tab, 150 MG at bedtime, 11/19/21   [name], DO      Allergies    Crestor Duncan Dull calcium], Cymbalta [duloxetine hcl], Effexor xr [venlafaxine hcl er], Morphine and codeine, Pravastatin, Promethazine-codeine, and Rizatriptan     Review of Systems   Review of Systems  All other systems reviewed and are negative.   Physical Exam Updated Vital Signs BP 124/63 (BP Location: Left Arm)   Pulse 82   Temp (!) 97.1 F (36.2 C)   Resp 18   SpO2 100%  Physical Exam Vitals and nursing note reviewed.  Constitutional:      General: She is not in acute distress.    Appearance: She is well-developed. She is not diaphoretic.  HENT:     Head: Normocephalic.     Comments: There is a small hematoma noted to the right forehead/temple area. Eyes:     Extraocular Movements: Extraocular movements intact.     Pupils: Pupils are equal, round, and reactive to light.  Cardiovascular:     Rate and Rhythm: Normal rate and regular rhythm.     Heart sounds: No murmur heard.    No friction rub. No gallop.  Pulmonary:     Effort: Pulmonary effort is normal. No respiratory distress.     Breath sounds: Normal breath sounds. No wheezing.  Abdominal:     General: Bowel sounds are normal. There is no distension.     Palpations: Abdomen is soft.     Tenderness: There is no abdominal tenderness.  Musculoskeletal:        General: Normal range of motion.  Cervical back: Normal range of motion and neck supple.     Comments: The right shoulder is grossly normal in appearance.  There is some tenderness of the inferior deltoid.  She has pain with abduction and raising her arm above her head, but the arm is neurovascularly intact.  Skin:    General: Skin is warm and dry.  Neurological:     General: No focal deficit present.     Mental Status: She is alert and oriented to person, place, and time.     Cranial Nerves: No cranial nerve deficit.     Motor: No weakness.     Coordination: Coordination normal.     ED Results / Procedures / Treatments   Labs (all labs ordered are listed, but only abnormal results are displayed) Labs Reviewed - No data to display  EKG None  Radiology DG Shoulder Right  Result Date:  04/22/2023 CLINICAL DATA:  Fall EXAM: RIGHT SHOULDER - 2+ VIEW COMPARISON:  None Available. FINDINGS: There is no evidence of fracture or dislocation. There are mild degenerative changes of the acromioclavicular joint. There is subacromial joint space narrowing which can be seen with chronic rotator cuff tendinopathy. Soft tissues are unremarkable. IMPRESSION: 1. No acute fracture or dislocation. 2. Subacromial joint space narrowing which can be seen with chronic rotator cuff tendinopathy. Electronically Signed   By: Darliss Cheney M.D.   On: 04/22/2023 00:16    Procedures Procedures    Medications Ordered in ED Medications  HYDROcodone-acetaminophen (NORCO/VICODIN) 5-325 MG per tablet 1 tablet (has no administration in time range)    ED Course/ Medical Decision Making/ A&P  Patient is a 77 year old female presenting with complaints of fall as described in the HPI.  She stays at an assisted living facility and fell attempting to walk to the bathroom.  Patient arrives here with stable vital signs and is afebrile.  She is complaining of right shoulder pain and has a contusion to the right temple.  Imaging studies obtained including x-ray of the right shoulder and CT scan of the head.  Right shoulder films are negative for acute injury.  CT scan of the head shows a small, approximately 3 mm x 9 mm parasagittal right frontal lobe acute subdural bleed with no mass effect.  Results of the CT scan discussed with [facility] from neurosurgery.  She has recommended repeat imaging in 6 hours to ensure that the subdural has not expanded.  This plan was discussed with the patient and patient's daughter at bedside.  The patient was absolutely adamant that she not stay here any longer.  She stated she could not get comfortable here and wanted to go back home.  After lengthy discussion with the patient and the daughter, I have agreed to allow her to return.  In my opinion, patient appears to have decision-making  capacity to refuse this treatment plan.  I have asked the daughter to make sure her facility checks on [name] intermittently throughout the night.  If she experiences headache, difficulty waking, gait instability, or any other symptoms, she should be brought back to the ER to be reevaluated.  Final Clinical Impression(s) / ED Diagnoses Final diagnoses:  None    Rx / DC Orders ED Discharge Orders     None         Geoffery Lyons, MD 04/22/23 [phone]

## 2023-04-22 NOTE — Discharge Instructions (Addendum)
Wear arm sling for comfort and support.  Measure shoulder for 20 minutes every 2 hours while awake for the next 2 days.  Take hydrocodone as prescribed as needed for pain.  Return to the emergency department if you develop severe headache, gait instability, difficulty waking, or for other new and concerning symptoms.

## 2023-05-07 ENCOUNTER — Ambulatory Visit (INDEPENDENT_AMBULATORY_CARE_PROVIDER_SITE_OTHER): Payer: Medicare HMO | Admitting: Internal Medicine

## 2023-05-07 VITALS — BP 108/78 | HR 75 | Temp 97.9°F | Wt 143.1 lb

## 2023-05-07 DIAGNOSIS — S065XAA Traumatic subdural hemorrhage with loss of consciousness status unknown, initial encounter: Secondary | ICD-10-CM

## 2023-05-07 DIAGNOSIS — M25511 Pain in right shoulder: Secondary | ICD-10-CM

## 2023-05-07 DIAGNOSIS — R296 Repeated falls: Secondary | ICD-10-CM

## 2023-05-07 DIAGNOSIS — Z09 Encounter for follow-up examination after completed treatment for conditions other than malignant neoplasm: Secondary | ICD-10-CM

## 2023-05-07 NOTE — Progress Notes (Signed)
Established Patient Office Visit     CC/Reason for Visit: ED follow-up  HPI: Shannon Sutton is a 77 y.o. female who is coming in today for the above mentioned reasons.  She suffered a fall at her assisted living facility on July 10.  It was a mechanical fall and she fell on her right arm and head.  She went to the emergency department.  X-rays of the shoulder were negative, however she was found to have a right frontal lobe subdural hematoma.  These results were discussed with on-call neurosurgery who recommended that she have a repeat CT in 6 hours, however patient decided to leave AGAINST MEDICAL ADVICE.  She has not yet had a follow-up CT scan.  She has not had any headaches or new focal neurologic deficits.  She has been experiencing some dizziness but this is chronic in nature.  She brings in home blood pressure measurements that show systolics of 95-115 on average.  Her main complaint today is continued right shoulder and upper arm pain.   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   Recurrent  Dr Nickola Major 2009 had a bx/MRI: was on MTX and Prednisone   External hemorrhoids    Fibromyalgia with myofascial pain 11/16/2012   Headaches, cluster    History of CT scan of head 2022   History of EKG 2021   History of mammogram 2022   History of MRI 2022   Brain   Hypercholesterolemia 12/15/2013   Hypertension    Insomnia 11/16/2012   Myalgia and myositis, unspecified    Other disorder of muscle, ligament, and fascia    eosinophilic fascitis   Other specified disease of hair and hair follicles    Pain in joint, multiple sites    Parkinson's disease    Restless leg syndrome 12/15/2013   Unspecified sleep apnea    Urine incontinence      Past Surgical History:  Procedure Laterality Date   ABDOMINAL HYSTERECTOMY     ABDOMINOPLASTY  2006   "tummy tuck"   BREAST REDUCTION SURGERY  1995   BREAST SURGERY     CATARACT EXTRACTION, BILATERAL     COLONOSCOPY  2018   MUSCLE BIOPSY  2009   DEEP TISSUE   REFRACTIVE SURGERY     TONSILLECTOMY     TOTAL ABDOMINAL HYSTERECTOMY  1999   WISDOM TOOTH EXTRACTION      Social History:  reports that she quit smoking about 36 years ago. Her smoking use included cigarettes. She has never used smokeless tobacco. She reports that she does not currently use alcohol. She reports that she does not use drugs.  Allergies: Allergies  Allergen Reactions   Crestor [Rosuvastatin Calcium] Other (See Comments)    abd pain   Cymbalta [Duloxetine Hcl] Nausea Only   Effexor Xr [Venlafaxine Hcl Er] Other (See Comments)    dizziness   Morphine And Codeine Itching    agitation   Pravastatin Other (See Comments)    abd pain   Promethazine-Codeine Itching    Itching in face   Rizatriptan Other (See Comments)    Tingling, prickly sensation, feelings of palpitations, flushing.     Family History:  Family History  Problem Relation Age  of Onset   Colon polyps Mother    Heart disease Mother    Heart attack Mother    Heart attack Father    Colon cancer Father 63   Heart disease Father    COPD Sister    Emphysema Sister    COPD Brother    Emphysema Brother    Lung cancer Brother    Breast cancer Paternal Grandmother    Heart disease Paternal Grandfather    Colon polyps Paternal Grandfather    Heart disease Other        family history   Parkinson's disease Other    Stomach cancer Neg Hx    Rectal cancer Neg Hx    Esophageal cancer Neg Hx      Current Outpatient Medications:    carbidopa-levodopa (SINEMET IR) 25-100 MG tablet, Take 3 tablets by mouth daily., Disp: , Rfl:    HYDROcodone-acetaminophen (NORCO) 5-325 MG tablet, Take 1-2 tablets by mouth every 6 (six) hours as needed.,  Disp: 15 tablet, Rfl: 0   Melatonin 10 MG TABS, Take 5-10 mg by mouth at bedtime., Disp: , Rfl:    rOPINIRole (REQUIP) 4 MG tablet, Take 1 mg by mouth daily., Disp: , Rfl:    sertraline (ZOLOFT) 100 MG tablet, Take 100 mg by mouth in the morning., Disp: , Rfl:    traZODone (DESYREL) 150 MG tablet, Take 1 tablet (150 mg total) by mouth at bedtime as needed for sleep. (Patient taking differently: Take 100 mg by mouth at bedtime.), Disp: 30 tablet, Rfl: 2  Review of Systems:  Negative unless indicated in HPI.   Physical Exam: Vitals:   05/07/23 1118  BP: 108/78  Pulse: 75  Temp: 97.9 F (36.6 C)  TempSrc: Oral  SpO2: 99%  Weight: 143 lb 1.6 oz (64.9 kg)    Body mass index is 27.04 kg/m.   Physical Exam Vitals reviewed.  HENT:     Head: Normocephalic and atraumatic.     Comments: Faded bruises over right cheek and right forehead Eyes:     Conjunctiva/sclera: Conjunctivae normal.     Pupils: Pupils are equal, round, and reactive to light.  Skin:    General: Skin is warm and dry.  Neurological:     General: No focal deficit present.     Mental Status: She is alert and oriented to person, place, and time.  Psychiatric:        Mood and Affect: Mood normal.        Behavior: Behavior normal.        Thought Content: Thought content normal.        Judgment: Judgment normal.      Impression and Plan:  Hospital discharge follow-up  Frequent falls  Subdural hematoma (HCC) -     CT HEAD WO CONTRAST ( ); Future  Acute pain of right shoulder -     Ambulatory referral to [facility] charts reviewed in detail. -Will order repeat CT head that was recommended to follow-up on her subdural hematoma. -For her right shoulder pain, suspect muscle bruising given negative x-ray.  Have advised icing, will send in 10 days of meloxicam as well as Occupational Therapy. -Given blood pressure is low normal I will decrease metoprolol to 12.5 mg twice daily.   Time spent:33  minutes reviewing chart, interviewing and examining patient and formulating plan of care.     Chaya Jan, MD [facility]

## 2023-05-08 ENCOUNTER — Telehealth: Payer: Self-pay | Admitting: Internal Medicine

## 2023-05-08 NOTE — Telephone Encounter (Signed)
[name], [employer] is calling to let md know patient will have PT eval on 05-14-2023 and the OT will follow

## 2023-05-13 DIAGNOSIS — Z86010 Personal history of colonic polyps: Secondary | ICD-10-CM | POA: Diagnosis not present

## 2023-05-13 DIAGNOSIS — F419 Anxiety disorder, unspecified: Secondary | ICD-10-CM | POA: Diagnosis not present

## 2023-05-13 DIAGNOSIS — R3981 Functional urinary incontinence: Secondary | ICD-10-CM | POA: Diagnosis not present

## 2023-05-13 DIAGNOSIS — G2581 Restless legs syndrome: Secondary | ICD-10-CM | POA: Diagnosis not present

## 2023-05-13 DIAGNOSIS — G4700 Insomnia, unspecified: Secondary | ICD-10-CM | POA: Diagnosis not present

## 2023-05-13 DIAGNOSIS — G20A1 Parkinson's disease without dyskinesia, without mention of fluctuations: Secondary | ICD-10-CM | POA: Diagnosis not present

## 2023-05-13 DIAGNOSIS — G4730 Sleep apnea, unspecified: Secondary | ICD-10-CM | POA: Diagnosis not present

## 2023-05-13 DIAGNOSIS — F32A Depression, unspecified: Secondary | ICD-10-CM | POA: Diagnosis not present

## 2023-05-13 DIAGNOSIS — M791 Myalgia, unspecified site: Secondary | ICD-10-CM | POA: Diagnosis not present

## 2023-05-13 DIAGNOSIS — I10 Essential (primary) hypertension: Secondary | ICD-10-CM | POA: Diagnosis not present

## 2023-05-13 DIAGNOSIS — Z9849 Cataract extraction status, unspecified eye: Secondary | ICD-10-CM | POA: Diagnosis not present

## 2023-05-13 DIAGNOSIS — K5900 Constipation, unspecified: Secondary | ICD-10-CM | POA: Diagnosis not present

## 2023-05-13 DIAGNOSIS — E7800 Pure hypercholesterolemia, unspecified: Secondary | ICD-10-CM | POA: Diagnosis not present

## 2023-05-13 DIAGNOSIS — M797 Fibromyalgia: Secondary | ICD-10-CM | POA: Diagnosis not present

## 2023-05-13 DIAGNOSIS — M354 Diffuse (eosinophilic) fasciitis: Secondary | ICD-10-CM | POA: Diagnosis not present

## 2023-05-13 DIAGNOSIS — Z9181 History of falling: Secondary | ICD-10-CM | POA: Diagnosis not present

## 2023-05-13 DIAGNOSIS — Z79899 Other long term (current) drug therapy: Secondary | ICD-10-CM | POA: Diagnosis not present

## 2023-05-13 DIAGNOSIS — S065X0D Traumatic subdural hemorrhage without loss of consciousness, subsequent encounter: Secondary | ICD-10-CM | POA: Diagnosis not present

## 2023-05-13 DIAGNOSIS — R296 Repeated falls: Secondary | ICD-10-CM | POA: Diagnosis not present

## 2023-05-13 DIAGNOSIS — Z90710 Acquired absence of both cervix and uterus: Secondary | ICD-10-CM | POA: Diagnosis not present

## 2023-05-13 DIAGNOSIS — M25511 Pain in right shoulder: Secondary | ICD-10-CM | POA: Diagnosis not present

## 2023-05-14 ENCOUNTER — Telehealth: Payer: Self-pay | Admitting: Internal Medicine

## 2023-05-14 NOTE — Telephone Encounter (Signed)
Flora PT with [facility]  is calling and would like VO  PT 1x4 .

## 2023-05-15 ENCOUNTER — Telehealth: Payer: Self-pay | Admitting: Internal Medicine

## 2023-05-15 DIAGNOSIS — F32A Depression, unspecified: Secondary | ICD-10-CM | POA: Diagnosis not present

## 2023-05-15 DIAGNOSIS — M791 Myalgia, unspecified site: Secondary | ICD-10-CM | POA: Diagnosis not present

## 2023-05-15 DIAGNOSIS — Z79899 Other long term (current) drug therapy: Secondary | ICD-10-CM | POA: Diagnosis not present

## 2023-05-15 DIAGNOSIS — E7800 Pure hypercholesterolemia, unspecified: Secondary | ICD-10-CM | POA: Diagnosis not present

## 2023-05-15 DIAGNOSIS — S065X0D Traumatic subdural hemorrhage without loss of consciousness, subsequent encounter: Secondary | ICD-10-CM | POA: Diagnosis not present

## 2023-05-15 DIAGNOSIS — M25511 Pain in right shoulder: Secondary | ICD-10-CM

## 2023-05-15 DIAGNOSIS — Z86010 Personal history of colonic polyps: Secondary | ICD-10-CM | POA: Diagnosis not present

## 2023-05-15 DIAGNOSIS — Z9849 Cataract extraction status, unspecified eye: Secondary | ICD-10-CM | POA: Diagnosis not present

## 2023-05-15 DIAGNOSIS — F419 Anxiety disorder, unspecified: Secondary | ICD-10-CM | POA: Diagnosis not present

## 2023-05-15 DIAGNOSIS — Z9181 History of falling: Secondary | ICD-10-CM | POA: Diagnosis not present

## 2023-05-15 DIAGNOSIS — R296 Repeated falls: Secondary | ICD-10-CM | POA: Diagnosis not present

## 2023-05-15 DIAGNOSIS — I10 Essential (primary) hypertension: Secondary | ICD-10-CM | POA: Diagnosis not present

## 2023-05-15 DIAGNOSIS — K5900 Constipation, unspecified: Secondary | ICD-10-CM | POA: Diagnosis not present

## 2023-05-15 DIAGNOSIS — G4730 Sleep apnea, unspecified: Secondary | ICD-10-CM | POA: Diagnosis not present

## 2023-05-15 DIAGNOSIS — G4700 Insomnia, unspecified: Secondary | ICD-10-CM | POA: Diagnosis not present

## 2023-05-15 DIAGNOSIS — G20A1 Parkinson's disease without dyskinesia, without mention of fluctuations: Secondary | ICD-10-CM | POA: Diagnosis not present

## 2023-05-15 DIAGNOSIS — Z90710 Acquired absence of both cervix and uterus: Secondary | ICD-10-CM | POA: Diagnosis not present

## 2023-05-15 DIAGNOSIS — R3981 Functional urinary incontinence: Secondary | ICD-10-CM | POA: Diagnosis not present

## 2023-05-15 DIAGNOSIS — G2581 Restless legs syndrome: Secondary | ICD-10-CM | POA: Diagnosis not present

## 2023-05-15 DIAGNOSIS — M797 Fibromyalgia: Secondary | ICD-10-CM | POA: Diagnosis not present

## 2023-05-15 DIAGNOSIS — M354 Diffuse (eosinophilic) fasciitis: Secondary | ICD-10-CM | POA: Diagnosis not present

## 2023-05-15 NOTE — Telephone Encounter (Signed)
Shannon Sutton OT with [facility] is calling and would like VO for  OT  1x4, 2x4

## 2023-05-15 NOTE — Telephone Encounter (Signed)
Shannon Sutton OT with [facility] is calling and pt need a referral to [facility] for right shoulder pain

## 2023-05-17 ENCOUNTER — Ambulatory Visit (HOSPITAL_BASED_OUTPATIENT_CLINIC_OR_DEPARTMENT_OTHER)
Admission: RE | Admit: 2023-05-17 | Discharge: 2023-05-17 | Disposition: A | Discharge status: Home / Self Care | Payer: Medicare HMO | Source: Ambulatory Visit | Attending: Internal Medicine | Admitting: Internal Medicine

## 2023-05-17 DIAGNOSIS — S065XAA Traumatic subdural hemorrhage with loss of consciousness status unknown, initial encounter: Secondary | ICD-10-CM | POA: Diagnosis not present

## 2023-05-17 DIAGNOSIS — I672 Cerebral atherosclerosis: Secondary | ICD-10-CM | POA: Diagnosis not present

## 2023-05-17 DIAGNOSIS — I6200 Nontraumatic subdural hemorrhage, unspecified: Secondary | ICD-10-CM | POA: Diagnosis not present

## 2023-05-18 NOTE — Telephone Encounter (Signed)
Order was placed patient is aware

## 2023-05-18 NOTE — Telephone Encounter (Signed)
Verbal orders for PT given to [facility] PT

## 2023-05-18 NOTE — Telephone Encounter (Signed)
Called and left a voicemail with Verbal Orders

## 2023-05-20 DIAGNOSIS — G20A1 Parkinson's disease without dyskinesia, without mention of fluctuations: Secondary | ICD-10-CM | POA: Diagnosis not present

## 2023-05-20 DIAGNOSIS — G2581 Restless legs syndrome: Secondary | ICD-10-CM | POA: Diagnosis not present

## 2023-05-20 DIAGNOSIS — G4700 Insomnia, unspecified: Secondary | ICD-10-CM | POA: Diagnosis not present

## 2023-05-20 DIAGNOSIS — Z9181 History of falling: Secondary | ICD-10-CM | POA: Diagnosis not present

## 2023-05-20 DIAGNOSIS — M25511 Pain in right shoulder: Secondary | ICD-10-CM | POA: Diagnosis not present

## 2023-05-20 DIAGNOSIS — Z79899 Other long term (current) drug therapy: Secondary | ICD-10-CM | POA: Diagnosis not present

## 2023-05-20 DIAGNOSIS — I10 Essential (primary) hypertension: Secondary | ICD-10-CM | POA: Diagnosis not present

## 2023-05-20 DIAGNOSIS — E7800 Pure hypercholesterolemia, unspecified: Secondary | ICD-10-CM | POA: Diagnosis not present

## 2023-05-20 DIAGNOSIS — F32A Depression, unspecified: Secondary | ICD-10-CM | POA: Diagnosis not present

## 2023-05-20 DIAGNOSIS — F419 Anxiety disorder, unspecified: Secondary | ICD-10-CM | POA: Diagnosis not present

## 2023-05-20 DIAGNOSIS — M791 Myalgia, unspecified site: Secondary | ICD-10-CM | POA: Diagnosis not present

## 2023-05-20 DIAGNOSIS — Z90710 Acquired absence of both cervix and uterus: Secondary | ICD-10-CM | POA: Diagnosis not present

## 2023-05-20 DIAGNOSIS — M354 Diffuse (eosinophilic) fasciitis: Secondary | ICD-10-CM | POA: Diagnosis not present

## 2023-05-20 DIAGNOSIS — G4730 Sleep apnea, unspecified: Secondary | ICD-10-CM | POA: Diagnosis not present

## 2023-05-20 DIAGNOSIS — S065X0D Traumatic subdural hemorrhage without loss of consciousness, subsequent encounter: Secondary | ICD-10-CM | POA: Diagnosis not present

## 2023-05-20 DIAGNOSIS — K5900 Constipation, unspecified: Secondary | ICD-10-CM | POA: Diagnosis not present

## 2023-05-20 DIAGNOSIS — M797 Fibromyalgia: Secondary | ICD-10-CM | POA: Diagnosis not present

## 2023-05-20 DIAGNOSIS — Z86010 Personal history of colonic polyps: Secondary | ICD-10-CM | POA: Diagnosis not present

## 2023-05-20 DIAGNOSIS — Z9849 Cataract extraction status, unspecified eye: Secondary | ICD-10-CM | POA: Diagnosis not present

## 2023-05-20 DIAGNOSIS — R3981 Functional urinary incontinence: Secondary | ICD-10-CM | POA: Diagnosis not present

## 2023-05-20 DIAGNOSIS — R296 Repeated falls: Secondary | ICD-10-CM | POA: Diagnosis not present

## 2023-05-22 DIAGNOSIS — G20A1 Parkinson's disease without dyskinesia, without mention of fluctuations: Secondary | ICD-10-CM | POA: Diagnosis not present

## 2023-05-22 DIAGNOSIS — I10 Essential (primary) hypertension: Secondary | ICD-10-CM | POA: Diagnosis not present

## 2023-05-22 DIAGNOSIS — M797 Fibromyalgia: Secondary | ICD-10-CM | POA: Diagnosis not present

## 2023-05-22 DIAGNOSIS — R3981 Functional urinary incontinence: Secondary | ICD-10-CM | POA: Diagnosis not present

## 2023-05-22 DIAGNOSIS — G4730 Sleep apnea, unspecified: Secondary | ICD-10-CM | POA: Diagnosis not present

## 2023-05-22 DIAGNOSIS — R296 Repeated falls: Secondary | ICD-10-CM | POA: Diagnosis not present

## 2023-05-22 DIAGNOSIS — Z90710 Acquired absence of both cervix and uterus: Secondary | ICD-10-CM | POA: Diagnosis not present

## 2023-05-22 DIAGNOSIS — Z9181 History of falling: Secondary | ICD-10-CM | POA: Diagnosis not present

## 2023-05-22 DIAGNOSIS — M791 Myalgia, unspecified site: Secondary | ICD-10-CM | POA: Diagnosis not present

## 2023-05-22 DIAGNOSIS — F32A Depression, unspecified: Secondary | ICD-10-CM | POA: Diagnosis not present

## 2023-05-22 DIAGNOSIS — F419 Anxiety disorder, unspecified: Secondary | ICD-10-CM | POA: Diagnosis not present

## 2023-05-22 DIAGNOSIS — G4700 Insomnia, unspecified: Secondary | ICD-10-CM | POA: Diagnosis not present

## 2023-05-22 DIAGNOSIS — Z79899 Other long term (current) drug therapy: Secondary | ICD-10-CM | POA: Diagnosis not present

## 2023-05-22 DIAGNOSIS — M25511 Pain in right shoulder: Secondary | ICD-10-CM | POA: Diagnosis not present

## 2023-05-22 DIAGNOSIS — S065X0D Traumatic subdural hemorrhage without loss of consciousness, subsequent encounter: Secondary | ICD-10-CM | POA: Diagnosis not present

## 2023-05-22 DIAGNOSIS — G2581 Restless legs syndrome: Secondary | ICD-10-CM | POA: Diagnosis not present

## 2023-05-22 DIAGNOSIS — M354 Diffuse (eosinophilic) fasciitis: Secondary | ICD-10-CM | POA: Diagnosis not present

## 2023-05-22 DIAGNOSIS — K5900 Constipation, unspecified: Secondary | ICD-10-CM | POA: Diagnosis not present

## 2023-05-22 DIAGNOSIS — Z86010 Personal history of colonic polyps: Secondary | ICD-10-CM | POA: Diagnosis not present

## 2023-05-22 DIAGNOSIS — E7800 Pure hypercholesterolemia, unspecified: Secondary | ICD-10-CM | POA: Diagnosis not present

## 2023-05-22 DIAGNOSIS — Z9849 Cataract extraction status, unspecified eye: Secondary | ICD-10-CM | POA: Diagnosis not present

## 2023-05-26 DIAGNOSIS — G20A1 Parkinson's disease without dyskinesia, without mention of fluctuations: Secondary | ICD-10-CM | POA: Diagnosis not present

## 2023-05-27 ENCOUNTER — Ambulatory Visit: Payer: Medicare HMO | Admitting: Orthopedic Surgery

## 2023-05-27 DIAGNOSIS — G4730 Sleep apnea, unspecified: Secondary | ICD-10-CM | POA: Diagnosis not present

## 2023-05-27 DIAGNOSIS — S065X0D Traumatic subdural hemorrhage without loss of consciousness, subsequent encounter: Secondary | ICD-10-CM | POA: Diagnosis not present

## 2023-05-27 DIAGNOSIS — R296 Repeated falls: Secondary | ICD-10-CM | POA: Diagnosis not present

## 2023-05-27 DIAGNOSIS — G20A1 Parkinson's disease without dyskinesia, without mention of fluctuations: Secondary | ICD-10-CM | POA: Diagnosis not present

## 2023-05-27 DIAGNOSIS — F419 Anxiety disorder, unspecified: Secondary | ICD-10-CM | POA: Diagnosis not present

## 2023-05-27 DIAGNOSIS — K5900 Constipation, unspecified: Secondary | ICD-10-CM | POA: Diagnosis not present

## 2023-05-27 DIAGNOSIS — Z79899 Other long term (current) drug therapy: Secondary | ICD-10-CM | POA: Diagnosis not present

## 2023-05-27 DIAGNOSIS — M797 Fibromyalgia: Secondary | ICD-10-CM | POA: Diagnosis not present

## 2023-05-27 DIAGNOSIS — M354 Diffuse (eosinophilic) fasciitis: Secondary | ICD-10-CM | POA: Diagnosis not present

## 2023-05-27 DIAGNOSIS — E7800 Pure hypercholesterolemia, unspecified: Secondary | ICD-10-CM | POA: Diagnosis not present

## 2023-05-27 DIAGNOSIS — M25511 Pain in right shoulder: Secondary | ICD-10-CM | POA: Diagnosis not present

## 2023-05-27 DIAGNOSIS — M791 Myalgia, unspecified site: Secondary | ICD-10-CM | POA: Diagnosis not present

## 2023-05-27 DIAGNOSIS — G4700 Insomnia, unspecified: Secondary | ICD-10-CM | POA: Diagnosis not present

## 2023-05-27 DIAGNOSIS — G2581 Restless legs syndrome: Secondary | ICD-10-CM | POA: Diagnosis not present

## 2023-05-27 DIAGNOSIS — F32A Depression, unspecified: Secondary | ICD-10-CM | POA: Diagnosis not present

## 2023-05-27 DIAGNOSIS — I10 Essential (primary) hypertension: Secondary | ICD-10-CM | POA: Diagnosis not present

## 2023-05-27 DIAGNOSIS — Z90710 Acquired absence of both cervix and uterus: Secondary | ICD-10-CM | POA: Diagnosis not present

## 2023-05-27 DIAGNOSIS — R3981 Functional urinary incontinence: Secondary | ICD-10-CM | POA: Diagnosis not present

## 2023-05-27 DIAGNOSIS — Z9181 History of falling: Secondary | ICD-10-CM | POA: Diagnosis not present

## 2023-05-27 DIAGNOSIS — Z9849 Cataract extraction status, unspecified eye: Secondary | ICD-10-CM | POA: Diagnosis not present

## 2023-05-27 DIAGNOSIS — Z86010 Personal history of colonic polyps: Secondary | ICD-10-CM | POA: Diagnosis not present

## 2023-05-29 ENCOUNTER — Telehealth: Payer: Self-pay | Admitting: Internal Medicine

## 2023-05-29 DIAGNOSIS — F32A Depression, unspecified: Secondary | ICD-10-CM | POA: Diagnosis not present

## 2023-05-29 DIAGNOSIS — M791 Myalgia, unspecified site: Secondary | ICD-10-CM | POA: Diagnosis not present

## 2023-05-29 DIAGNOSIS — Z86010 Personal history of colonic polyps: Secondary | ICD-10-CM | POA: Diagnosis not present

## 2023-05-29 DIAGNOSIS — Z9181 History of falling: Secondary | ICD-10-CM | POA: Diagnosis not present

## 2023-05-29 DIAGNOSIS — Z90710 Acquired absence of both cervix and uterus: Secondary | ICD-10-CM | POA: Diagnosis not present

## 2023-05-29 DIAGNOSIS — F419 Anxiety disorder, unspecified: Secondary | ICD-10-CM | POA: Diagnosis not present

## 2023-05-29 DIAGNOSIS — R296 Repeated falls: Secondary | ICD-10-CM | POA: Diagnosis not present

## 2023-05-29 DIAGNOSIS — G20A1 Parkinson's disease without dyskinesia, without mention of fluctuations: Secondary | ICD-10-CM | POA: Diagnosis not present

## 2023-05-29 DIAGNOSIS — S065X0D Traumatic subdural hemorrhage without loss of consciousness, subsequent encounter: Secondary | ICD-10-CM | POA: Diagnosis not present

## 2023-05-29 DIAGNOSIS — G4700 Insomnia, unspecified: Secondary | ICD-10-CM | POA: Diagnosis not present

## 2023-05-29 DIAGNOSIS — I10 Essential (primary) hypertension: Secondary | ICD-10-CM | POA: Diagnosis not present

## 2023-05-29 DIAGNOSIS — K5900 Constipation, unspecified: Secondary | ICD-10-CM | POA: Diagnosis not present

## 2023-05-29 DIAGNOSIS — Z9849 Cataract extraction status, unspecified eye: Secondary | ICD-10-CM | POA: Diagnosis not present

## 2023-05-29 DIAGNOSIS — G4730 Sleep apnea, unspecified: Secondary | ICD-10-CM | POA: Diagnosis not present

## 2023-05-29 DIAGNOSIS — R3981 Functional urinary incontinence: Secondary | ICD-10-CM | POA: Diagnosis not present

## 2023-05-29 DIAGNOSIS — Z79899 Other long term (current) drug therapy: Secondary | ICD-10-CM | POA: Diagnosis not present

## 2023-05-29 DIAGNOSIS — E7800 Pure hypercholesterolemia, unspecified: Secondary | ICD-10-CM | POA: Diagnosis not present

## 2023-05-29 DIAGNOSIS — G2581 Restless legs syndrome: Secondary | ICD-10-CM | POA: Diagnosis not present

## 2023-05-29 DIAGNOSIS — M354 Diffuse (eosinophilic) fasciitis: Secondary | ICD-10-CM | POA: Diagnosis not present

## 2023-05-29 DIAGNOSIS — M25511 Pain in right shoulder: Secondary | ICD-10-CM | POA: Diagnosis not present

## 2023-05-29 DIAGNOSIS — M797 Fibromyalgia: Secondary | ICD-10-CM | POA: Diagnosis not present

## 2023-05-29 NOTE — Telephone Encounter (Signed)
Reports patient is terrible right shoulder pain (level 10). Has appointment with ortho Wednesday. Wants to note patient bp 136/83, says this is odd because her readings are normally around 115/.

## 2023-06-02 DIAGNOSIS — Z9181 History of falling: Secondary | ICD-10-CM | POA: Diagnosis not present

## 2023-06-02 DIAGNOSIS — I10 Essential (primary) hypertension: Secondary | ICD-10-CM | POA: Diagnosis not present

## 2023-06-02 DIAGNOSIS — M791 Myalgia, unspecified site: Secondary | ICD-10-CM | POA: Diagnosis not present

## 2023-06-02 DIAGNOSIS — G2581 Restless legs syndrome: Secondary | ICD-10-CM | POA: Diagnosis not present

## 2023-06-02 DIAGNOSIS — R296 Repeated falls: Secondary | ICD-10-CM | POA: Diagnosis not present

## 2023-06-02 DIAGNOSIS — R3981 Functional urinary incontinence: Secondary | ICD-10-CM | POA: Diagnosis not present

## 2023-06-02 DIAGNOSIS — M354 Diffuse (eosinophilic) fasciitis: Secondary | ICD-10-CM | POA: Diagnosis not present

## 2023-06-02 DIAGNOSIS — S065X0D Traumatic subdural hemorrhage without loss of consciousness, subsequent encounter: Secondary | ICD-10-CM | POA: Diagnosis not present

## 2023-06-02 DIAGNOSIS — M797 Fibromyalgia: Secondary | ICD-10-CM | POA: Diagnosis not present

## 2023-06-02 DIAGNOSIS — E7800 Pure hypercholesterolemia, unspecified: Secondary | ICD-10-CM | POA: Diagnosis not present

## 2023-06-02 DIAGNOSIS — Z86010 Personal history of colonic polyps: Secondary | ICD-10-CM | POA: Diagnosis not present

## 2023-06-02 DIAGNOSIS — M25511 Pain in right shoulder: Secondary | ICD-10-CM | POA: Diagnosis not present

## 2023-06-02 DIAGNOSIS — F32A Depression, unspecified: Secondary | ICD-10-CM | POA: Diagnosis not present

## 2023-06-02 DIAGNOSIS — Z79899 Other long term (current) drug therapy: Secondary | ICD-10-CM | POA: Diagnosis not present

## 2023-06-02 DIAGNOSIS — F419 Anxiety disorder, unspecified: Secondary | ICD-10-CM | POA: Diagnosis not present

## 2023-06-02 DIAGNOSIS — G20A1 Parkinson's disease without dyskinesia, without mention of fluctuations: Secondary | ICD-10-CM | POA: Diagnosis not present

## 2023-06-02 DIAGNOSIS — K5900 Constipation, unspecified: Secondary | ICD-10-CM | POA: Diagnosis not present

## 2023-06-02 DIAGNOSIS — Z90710 Acquired absence of both cervix and uterus: Secondary | ICD-10-CM | POA: Diagnosis not present

## 2023-06-02 DIAGNOSIS — G4730 Sleep apnea, unspecified: Secondary | ICD-10-CM | POA: Diagnosis not present

## 2023-06-02 DIAGNOSIS — Z9849 Cataract extraction status, unspecified eye: Secondary | ICD-10-CM | POA: Diagnosis not present

## 2023-06-02 DIAGNOSIS — G4700 Insomnia, unspecified: Secondary | ICD-10-CM | POA: Diagnosis not present

## 2023-06-03 ENCOUNTER — Ambulatory Visit: Payer: Medicare HMO | Admitting: Orthopedic Surgery

## 2023-06-03 DIAGNOSIS — M75101 Unspecified rotator cuff tear or rupture of right shoulder, not specified as traumatic: Secondary | ICD-10-CM

## 2023-06-04 ENCOUNTER — Encounter: Payer: Self-pay | Admitting: Orthopedic Surgery

## 2023-06-04 NOTE — Progress Notes (Signed)
Office Visit Note   Patient: Shannon Sutton           Date of Birth: 11/21/1945           MRN: [id number] Visit Date: 06/03/2023 Requested by: Philip Aspen, Limmie Patricia, MD [address] [place],  [place] [address] PCP: Philip Aspen, Limmie Patricia, MD  Subjective: Chief Complaint  Patient presents with   Right Shoulder - Pain    HPI: Shannon Sutton is a 77 y.o. female who presents to the office reporting right shoulder pain.  She had a fall 1 month ago.  Describes constant pain.  She has been in a sling.  The pain radiates to the elbow.  Does report some numbness and tingling in the hand along with mild neck pain.  She is right-hand dominant.  Most of her relief occurs when her arms up near her neck.  She does have Parkinson's and takes carbidopa.  Has a history of injections 20 years ago.  She is getting physical therapy and Occupational Therapy at the facility where she lives.  Over-the-counter medications are not helpful.  She is doing physical therapy twice a week as well as using heat and ice also not particularly helpful..                ROS: All systems reviewed are negative as they relate to the chief complaint within the history of present illness.  Patient denies fevers or chills.  Assessment & Plan: Visit Diagnoses:  1. Tear of right rotator cuff, unspecified tear extent, unspecified whether traumatic     Plan: Impression is no fracture or dislocation on radiographs.  Likely has rotator cuff pathology based on examination and history.  Hard to know if this is a acute on chronic tear or chronic tear exacerbated by the fall.  She states she really cannot live with her current shoulder situation.  Plan for MRI nonarthrogram study of right shoulder to evaluate the size and chronicity of her rotator cuff tear.  Follow-up after that study.  Follow-Up Instructions: No follow-ups on file.   Orders:  Orders Placed This Encounter  Procedures   MR SHOULDER RIGHT WO CONTRAST    No orders of the defined types were placed in this encounter.     Procedures: No procedures performed   Clinical Data: No additional findings.  Objective: Vital Signs: There were no vitals taken for this visit.  Physical Exam:  Constitutional: Patient appears well-developed HEENT:  Head: Normocephalic Eyes:EOM are normal Neck: Normal range of motion Cardiovascular: Normal rate Pulmonary/chest: Effort normal Neurologic: Patient is alert Skin: Skin is warm Psychiatric: Patient has normal mood and affect  Ortho Exam: Ortho exam demonstrates diminished external rotation strength on the right compared to the left.  Active forward flexion abduction around 40 to 45 degrees.  Deltoid does fire.  Does have some coarseness with passive range of motion in that right shoulder.  Shoulder itself has maintained symmetric passive external rotation.  Motor or sensory function to the hand intact with no masses lymphadenopathy or skin changes noted in that shoulder girdle region.  Neck range of motion not particularly tender and is only slightly limited in flexion and extension.  Specialty Comments:  No specialty comments available.  Imaging: No results found.   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   Recurrent  Dr Nickola Major 2009 had a bx/MRI: was on MTX and Prednisone   External hemorrhoids    Fibromyalgia with myofascial pain 11/16/2012   Headaches, cluster    History of CT scan of head 2022   History of EKG 2021   History of  mammogram 2022   History of MRI 2022   Brain   Hypercholesterolemia 12/15/2013   Hypertension    Insomnia 11/16/2012   Myalgia and myositis, unspecified    Other disorder of muscle, ligament, and fascia    eosinophilic fascitis   Other specified disease of hair and hair follicles    Pain in joint, multiple sites    Parkinson's disease    Restless leg syndrome 12/15/2013   Unspecified sleep apnea    Urine incontinence     Family History  Problem Relation Age of Onset   Colon polyps Mother    Heart disease Mother    Heart attack Mother    Heart attack Father    Colon cancer Father 54   Heart disease Father    COPD Sister    Emphysema Sister    COPD Brother    Emphysema Brother    Lung cancer Brother    Breast cancer Paternal Grandmother    Heart disease Paternal Grandfather    Colon polyps Paternal Grandfather    Heart disease Other        family history   Parkinson's disease Other    Stomach cancer Neg Hx    Rectal cancer Neg Hx    Esophageal cancer Neg Hx     Past Surgical History:  Procedure Laterality Date   ABDOMINAL HYSTERECTOMY     ABDOMINOPLASTY  2006   "tummy tuck"   BREAST REDUCTION SURGERY  1995   BREAST SURGERY     CATARACT EXTRACTION, BILATERAL     COLONOSCOPY  2018   MUSCLE BIOPSY  2009   DEEP TISSUE   REFRACTIVE SURGERY     TONSILLECTOMY     TOTAL ABDOMINAL HYSTERECTOMY  1999   WISDOM TOOTH EXTRACTION     Social History   Occupational History   Occupation: Retired    Associate Professor: RETIRED    Comment: Development worker, community  Tobacco Use   Smoking status: Former    Current packs/day: 0.00    Types: Cigarettes    Quit date: 12/19/1986    Years since quitting: 36.4   Smokeless tobacco: Never  Vaping Use   Vaping status: Never Used  Substance and Sexual Activity   Alcohol use: Not Currently   Drug use: No   Sexual activity: Yes    Birth control/protection: Surgical, Post-menopausal

## 2023-06-05 ENCOUNTER — Telehealth: Payer: Self-pay | Admitting: Internal Medicine

## 2023-06-05 NOTE — Telephone Encounter (Signed)
Pt called to cancel her OT appointment, said she was in too much pain

## 2023-06-08 ENCOUNTER — Telehealth: Payer: Self-pay | Admitting: Internal Medicine

## 2023-06-08 DIAGNOSIS — R3981 Functional urinary incontinence: Secondary | ICD-10-CM | POA: Diagnosis not present

## 2023-06-08 DIAGNOSIS — Z86010 Personal history of colonic polyps: Secondary | ICD-10-CM | POA: Diagnosis not present

## 2023-06-08 DIAGNOSIS — Z9849 Cataract extraction status, unspecified eye: Secondary | ICD-10-CM | POA: Diagnosis not present

## 2023-06-08 DIAGNOSIS — G20A1 Parkinson's disease without dyskinesia, without mention of fluctuations: Secondary | ICD-10-CM | POA: Diagnosis not present

## 2023-06-08 DIAGNOSIS — G4700 Insomnia, unspecified: Secondary | ICD-10-CM | POA: Diagnosis not present

## 2023-06-08 DIAGNOSIS — F419 Anxiety disorder, unspecified: Secondary | ICD-10-CM | POA: Diagnosis not present

## 2023-06-08 DIAGNOSIS — F32A Depression, unspecified: Secondary | ICD-10-CM | POA: Diagnosis not present

## 2023-06-08 DIAGNOSIS — Z79899 Other long term (current) drug therapy: Secondary | ICD-10-CM | POA: Diagnosis not present

## 2023-06-08 DIAGNOSIS — M354 Diffuse (eosinophilic) fasciitis: Secondary | ICD-10-CM | POA: Diagnosis not present

## 2023-06-08 DIAGNOSIS — I10 Essential (primary) hypertension: Secondary | ICD-10-CM | POA: Diagnosis not present

## 2023-06-08 DIAGNOSIS — R296 Repeated falls: Secondary | ICD-10-CM | POA: Diagnosis not present

## 2023-06-08 DIAGNOSIS — M797 Fibromyalgia: Secondary | ICD-10-CM | POA: Diagnosis not present

## 2023-06-08 DIAGNOSIS — G2581 Restless legs syndrome: Secondary | ICD-10-CM | POA: Diagnosis not present

## 2023-06-08 DIAGNOSIS — M791 Myalgia, unspecified site: Secondary | ICD-10-CM | POA: Diagnosis not present

## 2023-06-08 DIAGNOSIS — E7800 Pure hypercholesterolemia, unspecified: Secondary | ICD-10-CM | POA: Diagnosis not present

## 2023-06-08 DIAGNOSIS — M25511 Pain in right shoulder: Secondary | ICD-10-CM | POA: Diagnosis not present

## 2023-06-08 DIAGNOSIS — S065X0D Traumatic subdural hemorrhage without loss of consciousness, subsequent encounter: Secondary | ICD-10-CM | POA: Diagnosis not present

## 2023-06-08 DIAGNOSIS — K5900 Constipation, unspecified: Secondary | ICD-10-CM | POA: Diagnosis not present

## 2023-06-08 DIAGNOSIS — Z9181 History of falling: Secondary | ICD-10-CM | POA: Diagnosis not present

## 2023-06-08 DIAGNOSIS — Z90710 Acquired absence of both cervix and uterus: Secondary | ICD-10-CM | POA: Diagnosis not present

## 2023-06-08 DIAGNOSIS — G4730 Sleep apnea, unspecified: Secondary | ICD-10-CM | POA: Diagnosis not present

## 2023-06-08 NOTE — Telephone Encounter (Signed)
Patient is having shoulder pain--9/10.  She is waiting for her MRI to be ordered and someone to contact her.  She said the 9/10 is at rest.  When she has to go to the bathroom or move in any way she is in excrutiating pain.  Delorise Shiner from home health called wanting to know if there was anything she could do--pt is only taking Tylenol right now.    Please call Delorise Shiner with [facility] back first so she can document, but then call pt's daughter French Ana) as well.   Delorise Shiner with home health stated it was ok to leave her a message that her voicemail in secure.

## 2023-06-09 DIAGNOSIS — G4730 Sleep apnea, unspecified: Secondary | ICD-10-CM | POA: Diagnosis not present

## 2023-06-09 DIAGNOSIS — R3981 Functional urinary incontinence: Secondary | ICD-10-CM | POA: Diagnosis not present

## 2023-06-09 DIAGNOSIS — R296 Repeated falls: Secondary | ICD-10-CM | POA: Diagnosis not present

## 2023-06-09 DIAGNOSIS — I10 Essential (primary) hypertension: Secondary | ICD-10-CM | POA: Diagnosis not present

## 2023-06-09 DIAGNOSIS — F419 Anxiety disorder, unspecified: Secondary | ICD-10-CM | POA: Diagnosis not present

## 2023-06-09 DIAGNOSIS — S065X0D Traumatic subdural hemorrhage without loss of consciousness, subsequent encounter: Secondary | ICD-10-CM | POA: Diagnosis not present

## 2023-06-09 DIAGNOSIS — Z9849 Cataract extraction status, unspecified eye: Secondary | ICD-10-CM | POA: Diagnosis not present

## 2023-06-09 DIAGNOSIS — E7800 Pure hypercholesterolemia, unspecified: Secondary | ICD-10-CM | POA: Diagnosis not present

## 2023-06-09 DIAGNOSIS — Z86010 Personal history of colonic polyps: Secondary | ICD-10-CM | POA: Diagnosis not present

## 2023-06-09 DIAGNOSIS — Z9181 History of falling: Secondary | ICD-10-CM | POA: Diagnosis not present

## 2023-06-09 DIAGNOSIS — G4700 Insomnia, unspecified: Secondary | ICD-10-CM | POA: Diagnosis not present

## 2023-06-09 DIAGNOSIS — Z79899 Other long term (current) drug therapy: Secondary | ICD-10-CM | POA: Diagnosis not present

## 2023-06-09 DIAGNOSIS — Z90710 Acquired absence of both cervix and uterus: Secondary | ICD-10-CM | POA: Diagnosis not present

## 2023-06-09 DIAGNOSIS — G20A1 Parkinson's disease without dyskinesia, without mention of fluctuations: Secondary | ICD-10-CM | POA: Diagnosis not present

## 2023-06-09 DIAGNOSIS — K5900 Constipation, unspecified: Secondary | ICD-10-CM | POA: Diagnosis not present

## 2023-06-09 DIAGNOSIS — G2581 Restless legs syndrome: Secondary | ICD-10-CM | POA: Diagnosis not present

## 2023-06-09 DIAGNOSIS — F32A Depression, unspecified: Secondary | ICD-10-CM | POA: Diagnosis not present

## 2023-06-09 DIAGNOSIS — M354 Diffuse (eosinophilic) fasciitis: Secondary | ICD-10-CM | POA: Diagnosis not present

## 2023-06-09 DIAGNOSIS — M797 Fibromyalgia: Secondary | ICD-10-CM | POA: Diagnosis not present

## 2023-06-09 DIAGNOSIS — M791 Myalgia, unspecified site: Secondary | ICD-10-CM | POA: Diagnosis not present

## 2023-06-09 DIAGNOSIS — M25511 Pain in right shoulder: Secondary | ICD-10-CM | POA: Diagnosis not present

## 2023-06-09 NOTE — Telephone Encounter (Signed)
Dr Hardie Shackleton recommendation given to [facility].

## 2023-06-10 ENCOUNTER — Telehealth: Payer: Self-pay | Admitting: Orthopedic Surgery

## 2023-06-10 NOTE — Telephone Encounter (Signed)
Patient called and said she id having very bad pain in right shoulder and can't do any activities. Can you please call her daughter phone #[phone] and her name is Traci.

## 2023-06-11 NOTE — Telephone Encounter (Signed)
I called -scan pending

## 2023-06-15 DIAGNOSIS — G2581 Restless legs syndrome: Secondary | ICD-10-CM | POA: Diagnosis not present

## 2023-06-15 DIAGNOSIS — Z79899 Other long term (current) drug therapy: Secondary | ICD-10-CM | POA: Diagnosis not present

## 2023-06-15 DIAGNOSIS — Z86010 Personal history of colonic polyps: Secondary | ICD-10-CM | POA: Diagnosis not present

## 2023-06-15 DIAGNOSIS — M797 Fibromyalgia: Secondary | ICD-10-CM | POA: Diagnosis not present

## 2023-06-15 DIAGNOSIS — G4730 Sleep apnea, unspecified: Secondary | ICD-10-CM | POA: Diagnosis not present

## 2023-06-15 DIAGNOSIS — E7800 Pure hypercholesterolemia, unspecified: Secondary | ICD-10-CM | POA: Diagnosis not present

## 2023-06-15 DIAGNOSIS — M354 Diffuse (eosinophilic) fasciitis: Secondary | ICD-10-CM | POA: Diagnosis not present

## 2023-06-15 DIAGNOSIS — F32A Depression, unspecified: Secondary | ICD-10-CM | POA: Diagnosis not present

## 2023-06-15 DIAGNOSIS — G20A1 Parkinson's disease without dyskinesia, without mention of fluctuations: Secondary | ICD-10-CM | POA: Diagnosis not present

## 2023-06-15 DIAGNOSIS — R296 Repeated falls: Secondary | ICD-10-CM | POA: Diagnosis not present

## 2023-06-15 DIAGNOSIS — Z9849 Cataract extraction status, unspecified eye: Secondary | ICD-10-CM | POA: Diagnosis not present

## 2023-06-15 DIAGNOSIS — M791 Myalgia, unspecified site: Secondary | ICD-10-CM | POA: Diagnosis not present

## 2023-06-15 DIAGNOSIS — S065X0D Traumatic subdural hemorrhage without loss of consciousness, subsequent encounter: Secondary | ICD-10-CM | POA: Diagnosis not present

## 2023-06-15 DIAGNOSIS — M25511 Pain in right shoulder: Secondary | ICD-10-CM | POA: Diagnosis not present

## 2023-06-15 DIAGNOSIS — F419 Anxiety disorder, unspecified: Secondary | ICD-10-CM | POA: Diagnosis not present

## 2023-06-15 DIAGNOSIS — I10 Essential (primary) hypertension: Secondary | ICD-10-CM | POA: Diagnosis not present

## 2023-06-15 DIAGNOSIS — Z9181 History of falling: Secondary | ICD-10-CM | POA: Diagnosis not present

## 2023-06-15 DIAGNOSIS — Z90710 Acquired absence of both cervix and uterus: Secondary | ICD-10-CM | POA: Diagnosis not present

## 2023-06-15 DIAGNOSIS — G4700 Insomnia, unspecified: Secondary | ICD-10-CM | POA: Diagnosis not present

## 2023-06-15 DIAGNOSIS — R3981 Functional urinary incontinence: Secondary | ICD-10-CM | POA: Diagnosis not present

## 2023-06-15 DIAGNOSIS — K5900 Constipation, unspecified: Secondary | ICD-10-CM | POA: Diagnosis not present

## 2023-06-18 DIAGNOSIS — M791 Myalgia, unspecified site: Secondary | ICD-10-CM | POA: Diagnosis not present

## 2023-06-18 DIAGNOSIS — Z9181 History of falling: Secondary | ICD-10-CM | POA: Diagnosis not present

## 2023-06-18 DIAGNOSIS — Z9849 Cataract extraction status, unspecified eye: Secondary | ICD-10-CM | POA: Diagnosis not present

## 2023-06-18 DIAGNOSIS — R296 Repeated falls: Secondary | ICD-10-CM | POA: Diagnosis not present

## 2023-06-18 DIAGNOSIS — M797 Fibromyalgia: Secondary | ICD-10-CM | POA: Diagnosis not present

## 2023-06-18 DIAGNOSIS — M25511 Pain in right shoulder: Secondary | ICD-10-CM | POA: Diagnosis not present

## 2023-06-18 DIAGNOSIS — F32A Depression, unspecified: Secondary | ICD-10-CM | POA: Diagnosis not present

## 2023-06-18 DIAGNOSIS — M354 Diffuse (eosinophilic) fasciitis: Secondary | ICD-10-CM | POA: Diagnosis not present

## 2023-06-18 DIAGNOSIS — I10 Essential (primary) hypertension: Secondary | ICD-10-CM | POA: Diagnosis not present

## 2023-06-18 DIAGNOSIS — G4700 Insomnia, unspecified: Secondary | ICD-10-CM | POA: Diagnosis not present

## 2023-06-18 DIAGNOSIS — S065X0D Traumatic subdural hemorrhage without loss of consciousness, subsequent encounter: Secondary | ICD-10-CM | POA: Diagnosis not present

## 2023-06-18 DIAGNOSIS — E7800 Pure hypercholesterolemia, unspecified: Secondary | ICD-10-CM | POA: Diagnosis not present

## 2023-06-18 DIAGNOSIS — G2581 Restless legs syndrome: Secondary | ICD-10-CM | POA: Diagnosis not present

## 2023-06-18 DIAGNOSIS — K5900 Constipation, unspecified: Secondary | ICD-10-CM | POA: Diagnosis not present

## 2023-06-18 DIAGNOSIS — Z86010 Personal history of colonic polyps: Secondary | ICD-10-CM | POA: Diagnosis not present

## 2023-06-18 DIAGNOSIS — R3981 Functional urinary incontinence: Secondary | ICD-10-CM | POA: Diagnosis not present

## 2023-06-18 DIAGNOSIS — G20A1 Parkinson's disease without dyskinesia, without mention of fluctuations: Secondary | ICD-10-CM | POA: Diagnosis not present

## 2023-06-18 DIAGNOSIS — Z79899 Other long term (current) drug therapy: Secondary | ICD-10-CM | POA: Diagnosis not present

## 2023-06-18 DIAGNOSIS — G4730 Sleep apnea, unspecified: Secondary | ICD-10-CM | POA: Diagnosis not present

## 2023-06-18 DIAGNOSIS — Z90710 Acquired absence of both cervix and uterus: Secondary | ICD-10-CM | POA: Diagnosis not present

## 2023-06-18 DIAGNOSIS — F419 Anxiety disorder, unspecified: Secondary | ICD-10-CM | POA: Diagnosis not present

## 2023-06-24 ENCOUNTER — Telehealth: Payer: Self-pay | Admitting: Internal Medicine

## 2023-06-24 NOTE — Telephone Encounter (Signed)
Shannon Sutton OT [facility] is calling pt does not feel like have OT today and Shannon Sutton will see pt on 06-26-2023

## 2023-06-26 DIAGNOSIS — F419 Anxiety disorder, unspecified: Secondary | ICD-10-CM | POA: Diagnosis not present

## 2023-06-26 DIAGNOSIS — M791 Myalgia, unspecified site: Secondary | ICD-10-CM | POA: Diagnosis not present

## 2023-06-26 DIAGNOSIS — Z9181 History of falling: Secondary | ICD-10-CM | POA: Diagnosis not present

## 2023-06-26 DIAGNOSIS — G2581 Restless legs syndrome: Secondary | ICD-10-CM | POA: Diagnosis not present

## 2023-06-26 DIAGNOSIS — K5900 Constipation, unspecified: Secondary | ICD-10-CM | POA: Diagnosis not present

## 2023-06-26 DIAGNOSIS — I10 Essential (primary) hypertension: Secondary | ICD-10-CM | POA: Diagnosis not present

## 2023-06-26 DIAGNOSIS — M25511 Pain in right shoulder: Secondary | ICD-10-CM | POA: Diagnosis not present

## 2023-06-26 DIAGNOSIS — E7800 Pure hypercholesterolemia, unspecified: Secondary | ICD-10-CM | POA: Diagnosis not present

## 2023-06-26 DIAGNOSIS — R3981 Functional urinary incontinence: Secondary | ICD-10-CM | POA: Diagnosis not present

## 2023-06-26 DIAGNOSIS — Z9849 Cataract extraction status, unspecified eye: Secondary | ICD-10-CM | POA: Diagnosis not present

## 2023-06-26 DIAGNOSIS — Z86010 Personal history of colonic polyps: Secondary | ICD-10-CM | POA: Diagnosis not present

## 2023-06-26 DIAGNOSIS — Z79899 Other long term (current) drug therapy: Secondary | ICD-10-CM | POA: Diagnosis not present

## 2023-06-26 DIAGNOSIS — R296 Repeated falls: Secondary | ICD-10-CM | POA: Diagnosis not present

## 2023-06-26 DIAGNOSIS — S065X0D Traumatic subdural hemorrhage without loss of consciousness, subsequent encounter: Secondary | ICD-10-CM | POA: Diagnosis not present

## 2023-06-26 DIAGNOSIS — G4730 Sleep apnea, unspecified: Secondary | ICD-10-CM | POA: Diagnosis not present

## 2023-06-26 DIAGNOSIS — G20A1 Parkinson's disease without dyskinesia, without mention of fluctuations: Secondary | ICD-10-CM | POA: Diagnosis not present

## 2023-06-26 DIAGNOSIS — F32A Depression, unspecified: Secondary | ICD-10-CM | POA: Diagnosis not present

## 2023-06-26 DIAGNOSIS — M797 Fibromyalgia: Secondary | ICD-10-CM | POA: Diagnosis not present

## 2023-06-26 DIAGNOSIS — G4700 Insomnia, unspecified: Secondary | ICD-10-CM | POA: Diagnosis not present

## 2023-06-26 DIAGNOSIS — Z90710 Acquired absence of both cervix and uterus: Secondary | ICD-10-CM | POA: Diagnosis not present

## 2023-06-26 DIAGNOSIS — M354 Diffuse (eosinophilic) fasciitis: Secondary | ICD-10-CM | POA: Diagnosis not present

## 2023-06-29 ENCOUNTER — Telehealth: Payer: Self-pay | Admitting: Internal Medicine

## 2023-06-29 NOTE — Telephone Encounter (Signed)
Delorise Shiner from [facility] call and stated she need a verbal order to reduce pt frequency from twice a week to once a week.Delorise Shiner # is [phone] she stated you can leave her a message it is secure.

## 2023-06-29 NOTE — Telephone Encounter (Signed)
Verbal orders given to [facility] from [facility].

## 2023-07-03 DIAGNOSIS — G4730 Sleep apnea, unspecified: Secondary | ICD-10-CM | POA: Diagnosis not present

## 2023-07-03 DIAGNOSIS — F32A Depression, unspecified: Secondary | ICD-10-CM | POA: Diagnosis not present

## 2023-07-03 DIAGNOSIS — Z79899 Other long term (current) drug therapy: Secondary | ICD-10-CM | POA: Diagnosis not present

## 2023-07-03 DIAGNOSIS — M354 Diffuse (eosinophilic) fasciitis: Secondary | ICD-10-CM | POA: Diagnosis not present

## 2023-07-03 DIAGNOSIS — Z9849 Cataract extraction status, unspecified eye: Secondary | ICD-10-CM | POA: Diagnosis not present

## 2023-07-03 DIAGNOSIS — M25511 Pain in right shoulder: Secondary | ICD-10-CM | POA: Diagnosis not present

## 2023-07-03 DIAGNOSIS — Z9181 History of falling: Secondary | ICD-10-CM | POA: Diagnosis not present

## 2023-07-03 DIAGNOSIS — R296 Repeated falls: Secondary | ICD-10-CM | POA: Diagnosis not present

## 2023-07-03 DIAGNOSIS — Z86010 Personal history of colonic polyps: Secondary | ICD-10-CM | POA: Diagnosis not present

## 2023-07-03 DIAGNOSIS — F419 Anxiety disorder, unspecified: Secondary | ICD-10-CM | POA: Diagnosis not present

## 2023-07-03 DIAGNOSIS — M797 Fibromyalgia: Secondary | ICD-10-CM | POA: Diagnosis not present

## 2023-07-03 DIAGNOSIS — R3981 Functional urinary incontinence: Secondary | ICD-10-CM | POA: Diagnosis not present

## 2023-07-03 DIAGNOSIS — S065X0D Traumatic subdural hemorrhage without loss of consciousness, subsequent encounter: Secondary | ICD-10-CM | POA: Diagnosis not present

## 2023-07-03 DIAGNOSIS — M791 Myalgia, unspecified site: Secondary | ICD-10-CM | POA: Diagnosis not present

## 2023-07-03 DIAGNOSIS — G4700 Insomnia, unspecified: Secondary | ICD-10-CM | POA: Diagnosis not present

## 2023-07-03 DIAGNOSIS — G20A1 Parkinson's disease without dyskinesia, without mention of fluctuations: Secondary | ICD-10-CM | POA: Diagnosis not present

## 2023-07-03 DIAGNOSIS — Z90710 Acquired absence of both cervix and uterus: Secondary | ICD-10-CM | POA: Diagnosis not present

## 2023-07-03 DIAGNOSIS — E7800 Pure hypercholesterolemia, unspecified: Secondary | ICD-10-CM | POA: Diagnosis not present

## 2023-07-03 DIAGNOSIS — K5900 Constipation, unspecified: Secondary | ICD-10-CM | POA: Diagnosis not present

## 2023-07-03 DIAGNOSIS — I10 Essential (primary) hypertension: Secondary | ICD-10-CM | POA: Diagnosis not present

## 2023-07-03 DIAGNOSIS — G2581 Restless legs syndrome: Secondary | ICD-10-CM | POA: Diagnosis not present

## 2023-07-04 ENCOUNTER — Ambulatory Visit
Admission: RE | Admit: 2023-07-04 | Discharge: 2023-07-04 | Disposition: A | Discharge status: Home / Self Care | Payer: Medicare HMO | Source: Ambulatory Visit | Attending: Orthopedic Surgery | Admitting: Orthopedic Surgery

## 2023-07-04 DIAGNOSIS — S46011A Strain of muscle(s) and tendon(s) of the rotator cuff of right shoulder, initial encounter: Secondary | ICD-10-CM | POA: Diagnosis not present

## 2023-07-04 DIAGNOSIS — M25511 Pain in right shoulder: Secondary | ICD-10-CM | POA: Diagnosis not present

## 2023-07-04 DIAGNOSIS — M75101 Unspecified rotator cuff tear or rupture of right shoulder, not specified as traumatic: Secondary | ICD-10-CM

## 2023-07-04 DIAGNOSIS — M7581 Other shoulder lesions, right shoulder: Secondary | ICD-10-CM | POA: Diagnosis not present

## 2023-07-07 DIAGNOSIS — Z86010 Personal history of colonic polyps: Secondary | ICD-10-CM | POA: Diagnosis not present

## 2023-07-07 DIAGNOSIS — M354 Diffuse (eosinophilic) fasciitis: Secondary | ICD-10-CM | POA: Diagnosis not present

## 2023-07-07 DIAGNOSIS — M797 Fibromyalgia: Secondary | ICD-10-CM | POA: Diagnosis not present

## 2023-07-07 DIAGNOSIS — G4730 Sleep apnea, unspecified: Secondary | ICD-10-CM | POA: Diagnosis not present

## 2023-07-07 DIAGNOSIS — Z9181 History of falling: Secondary | ICD-10-CM | POA: Diagnosis not present

## 2023-07-07 DIAGNOSIS — Z9849 Cataract extraction status, unspecified eye: Secondary | ICD-10-CM | POA: Diagnosis not present

## 2023-07-07 DIAGNOSIS — I10 Essential (primary) hypertension: Secondary | ICD-10-CM | POA: Diagnosis not present

## 2023-07-07 DIAGNOSIS — Z79899 Other long term (current) drug therapy: Secondary | ICD-10-CM | POA: Diagnosis not present

## 2023-07-07 DIAGNOSIS — Z90710 Acquired absence of both cervix and uterus: Secondary | ICD-10-CM | POA: Diagnosis not present

## 2023-07-07 DIAGNOSIS — E7800 Pure hypercholesterolemia, unspecified: Secondary | ICD-10-CM | POA: Diagnosis not present

## 2023-07-07 DIAGNOSIS — M25511 Pain in right shoulder: Secondary | ICD-10-CM | POA: Diagnosis not present

## 2023-07-07 DIAGNOSIS — F32A Depression, unspecified: Secondary | ICD-10-CM | POA: Diagnosis not present

## 2023-07-07 DIAGNOSIS — R296 Repeated falls: Secondary | ICD-10-CM | POA: Diagnosis not present

## 2023-07-07 DIAGNOSIS — M791 Myalgia, unspecified site: Secondary | ICD-10-CM | POA: Diagnosis not present

## 2023-07-07 DIAGNOSIS — K5900 Constipation, unspecified: Secondary | ICD-10-CM | POA: Diagnosis not present

## 2023-07-07 DIAGNOSIS — G2581 Restless legs syndrome: Secondary | ICD-10-CM | POA: Diagnosis not present

## 2023-07-07 DIAGNOSIS — R3981 Functional urinary incontinence: Secondary | ICD-10-CM | POA: Diagnosis not present

## 2023-07-07 DIAGNOSIS — G4700 Insomnia, unspecified: Secondary | ICD-10-CM | POA: Diagnosis not present

## 2023-07-07 DIAGNOSIS — F419 Anxiety disorder, unspecified: Secondary | ICD-10-CM | POA: Diagnosis not present

## 2023-07-07 DIAGNOSIS — S065X0D Traumatic subdural hemorrhage without loss of consciousness, subsequent encounter: Secondary | ICD-10-CM | POA: Diagnosis not present

## 2023-07-07 DIAGNOSIS — G20A1 Parkinson's disease without dyskinesia, without mention of fluctuations: Secondary | ICD-10-CM | POA: Diagnosis not present

## 2023-07-09 ENCOUNTER — Telehealth: Payer: Self-pay | Admitting: Internal Medicine

## 2023-07-09 NOTE — Telephone Encounter (Signed)
Continue OT 1x8

## 2023-07-13 NOTE — Telephone Encounter (Signed)
Left detailed message on machine for  [facility]  With verbal orders

## 2023-07-15 DIAGNOSIS — G4730 Sleep apnea, unspecified: Secondary | ICD-10-CM | POA: Diagnosis not present

## 2023-07-15 DIAGNOSIS — M797 Fibromyalgia: Secondary | ICD-10-CM | POA: Diagnosis not present

## 2023-07-15 DIAGNOSIS — Z79899 Other long term (current) drug therapy: Secondary | ICD-10-CM | POA: Diagnosis not present

## 2023-07-15 DIAGNOSIS — M354 Diffuse (eosinophilic) fasciitis: Secondary | ICD-10-CM | POA: Diagnosis not present

## 2023-07-15 DIAGNOSIS — R296 Repeated falls: Secondary | ICD-10-CM | POA: Diagnosis not present

## 2023-07-15 DIAGNOSIS — Z860100 Personal history of colon polyps, unspecified: Secondary | ICD-10-CM | POA: Diagnosis not present

## 2023-07-15 DIAGNOSIS — E7800 Pure hypercholesterolemia, unspecified: Secondary | ICD-10-CM | POA: Diagnosis not present

## 2023-07-15 DIAGNOSIS — F32A Depression, unspecified: Secondary | ICD-10-CM | POA: Diagnosis not present

## 2023-07-15 DIAGNOSIS — Z90710 Acquired absence of both cervix and uterus: Secondary | ICD-10-CM | POA: Diagnosis not present

## 2023-07-15 DIAGNOSIS — I10 Essential (primary) hypertension: Secondary | ICD-10-CM | POA: Diagnosis not present

## 2023-07-15 DIAGNOSIS — R3981 Functional urinary incontinence: Secondary | ICD-10-CM | POA: Diagnosis not present

## 2023-07-15 DIAGNOSIS — Z9849 Cataract extraction status, unspecified eye: Secondary | ICD-10-CM | POA: Diagnosis not present

## 2023-07-15 DIAGNOSIS — M791 Myalgia, unspecified site: Secondary | ICD-10-CM | POA: Diagnosis not present

## 2023-07-15 DIAGNOSIS — M25511 Pain in right shoulder: Secondary | ICD-10-CM | POA: Diagnosis not present

## 2023-07-15 DIAGNOSIS — K5900 Constipation, unspecified: Secondary | ICD-10-CM | POA: Diagnosis not present

## 2023-07-15 DIAGNOSIS — F419 Anxiety disorder, unspecified: Secondary | ICD-10-CM | POA: Diagnosis not present

## 2023-07-15 DIAGNOSIS — S065X0D Traumatic subdural hemorrhage without loss of consciousness, subsequent encounter: Secondary | ICD-10-CM | POA: Diagnosis not present

## 2023-07-15 DIAGNOSIS — G4700 Insomnia, unspecified: Secondary | ICD-10-CM | POA: Diagnosis not present

## 2023-07-15 DIAGNOSIS — G20A1 Parkinson's disease without dyskinesia, without mention of fluctuations: Secondary | ICD-10-CM | POA: Diagnosis not present

## 2023-07-15 DIAGNOSIS — G2581 Restless legs syndrome: Secondary | ICD-10-CM | POA: Diagnosis not present

## 2023-07-16 ENCOUNTER — Ambulatory Visit: Payer: Medicare HMO | Admitting: Family Medicine

## 2023-07-16 VITALS — Ht 61.0 in | Wt 143.0 lb

## 2023-07-16 DIAGNOSIS — Z0000 Encounter for general adult medical examination without abnormal findings: Secondary | ICD-10-CM

## 2023-07-16 NOTE — Progress Notes (Signed)
PATIENT CHECK-IN and HEALTH RISK ASSESSMENT QUESTIONNAIRE:  -completed by phone/video for upcoming Medicare Preventive Visit  Pre-Visit Check-in: 1)Vitals (height, wt, BP, etc) - record in vitals section for visit on day of visit Request home vitals (wt, BP, etc.) and enter into vitals, THEN update Vital Signs SmartPhrase below at the top of the HPI. See below.  2)Review and Update Medications, Allergies PMH, Surgeries, Social history in Epic 3)Hospitalizations in the last year with date/reason? Had a fall in fall of 2024 and well to ER but not stay in hospital  4)Review and Update Care Team (patient's specialists) in Epic 5) Complete PHQ9 in Epic  6) Complete Fall Screening in Epic 7)Review all Health Maintenance Due and order under PCP if not done.  8)Medicare Wellness Questionnaire: Answer theses question about your habits: Do you drink alcohol? No  Have you ever smoked?Yes Quit date if applicable? 1988  How many packs a day do/did you smoke? 1/2 of a pack Do you use smokeless tobacco?no Do you use an illicit drugs?no Do you exercises? No due to injury Are you sexually active? No Number of partners?n/a Typical breakfast: eggbites, frozen breakfast Typical lunch: sandwich-turkey n cheese, small salad Typical dinner: frozen meal- lasagna Typical snacks: m and m, ice cream  Beverages: water, diet pepsi- caffeine free.   Answer theses question about you: Can you perform most household chores?yes  Do you find it hard to follow a conversation in a noisy room?yes Do you often ask people to speak up or repeat themselves?no Do you feel that you have a problem with memory?some Do you balance your checkbook and or bank acounts?yes Do you feel safe at home?yes Last dentist visit?2 years ago Do you need assistance with any of the following: Please note if so    Driving? Don't drive  Feeding yourself?  Getting from bed to chair?  Getting to the toilet?  Bathing or  showering?  Dressing yourself?  Managing money?  Climbing a flight of stairs  Preparing meals?  Do you have Advanced Directives in place (Living Will, Healthcare Power or Attorney)? yes   Last eye Exam and location?a year ago with [facility] in [place], [place]   Do you currently use prescribed or non-prescribed narcotic or opioid pain medications?no  Do you have a history or close family history of breast, ovarian, tubal or peritoneal cancer or a family member with BRCA (breast cancer susceptibility 1 and 2) gene mutations? No  Request home vitals (wt, BP, etc.) and enter into vitals, THEN update Vital Signs SmartPhrase below at the top of the HPI. See below.  Pt reports nurse takes her BP at 1:00pm  Nurse/Assistant Credentials/time stamp: Karpuih Moyun/CMA/10:29am.    ----------------------------------------------------------------------------------------------------------------------------------------------------------------------------------------------------------------------  Because this visit was a virtual/telehealth visit, some criteria may be missing or patient reported. Any vitals not documented were not able to be obtained and vitals that have been documented are patient reported.    MEDICARE ANNUAL PREVENTIVE VISIT WITH PROVIDER: (Welcome to Medicare, initial annual wellness or annual wellness exam)  Virtual Visit via Video Note  I connected with Shannon Sutton on 07/16/23 by  a video enabled telemedicine application and verified that I am speaking with the correct person using two identifiers.  Location patient: home Location provider:work or home office Persons participating in the virtual visit: patient, provider  Concerns and/or follow up today: recently had rotator cuff tear after a fall in the middle of the night when she got up to get water. Has Parkinsons. Doing physical  therapy. Now can do light things with your arm.    See HM section in Epic for other  details of completed HM.    ROS: negative for report of fevers, unintentional weight loss, vision changes, vision loss, hearing loss or change, chest pain, sob, hemoptysis, melena, hematochezia, hematuria, falls, bleeding or bruising, thoughts of suicide or self harm, memory loss  Patient-completed extensive health risk assessment - reviewed and discussed with the patient: See Health Risk Assessment completed with patient prior to the visit either above or in recent phone note. This was reviewed in detailed with the patient today and appropriate recommendations, orders and referrals were placed as needed per Summary below and patient instructions.   Review of Medical History: -PMH, PSH, Family History and current specialty and care providers reviewed and updated and listed below   Patient Care Team: Philip Aspen, Limmie Patricia, MD as PCP - General (Internal Medicine) Jodelle Red, MD as PCP - Cardiology (Cardiology) Tat, Octaviano Batty, DO as Consulting Physician (Neurology) Talmage Coin, MD as Consulting Physician (Endocrinology) Myrtie Neither, Andreas Blower, MD as Consulting Physician (Gastroenterology) Lars Masson, MD as Consulting Physician (Cardiology) Elpidio Galea, MD as Consulting Physician (Ophthalmology)   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   Recurrent  Dr Nickola Major 2009 had a bx/MRI: was on MTX and Prednisone   External hemorrhoids    Fibromyalgia with myofascial pain 11/16/2012   Headaches, cluster    History of CT scan of head 2022   History of EKG 2021   History of mammogram 2022   History of MRI 2022   Brain   Hypercholesterolemia 12/15/2013   Hypertension    Insomnia 11/16/2012   Myalgia and myositis, unspecified    Other disorder of muscle,  ligament, and fascia    eosinophilic fascitis   Other specified disease of hair and hair follicles    Pain in joint, multiple sites    Parkinson's disease    Restless leg syndrome 12/15/2013   Unspecified sleep apnea    Urine incontinence     Past Surgical History:  Procedure Laterality Date   ABDOMINAL HYSTERECTOMY     ABDOMINOPLASTY  2006   "tummy tuck"   BREAST REDUCTION SURGERY  1995   BREAST SURGERY     CATARACT EXTRACTION, BILATERAL     COLONOSCOPY  2018   MUSCLE BIOPSY  2009   DEEP TISSUE   REFRACTIVE SURGERY     TONSILLECTOMY     TOTAL ABDOMINAL HYSTERECTOMY  1999   WISDOM TOOTH EXTRACTION      Social History   Socioeconomic History   Marital status: Divorced    Spouse name: Not on file   Number of children: 2   Years of education: Not on file   Highest education level: Some college, no degree  Occupational History   Occupation: Retired    Associate Professor: RETIRED    Comment: Development worker, community  Tobacco Use   Smoking status: Former    Current packs/day: 0.00    Types: Cigarettes    Quit date: 12/19/1986    Years since quitting: 36.5   Smokeless tobacco: Never  Vaping Use   Vaping status: Never Used  Substance and Sexual Activity   Alcohol use: Not Currently  Drug use: No   Sexual activity: Yes    Birth control/protection: Surgical, Post-menopausal  Other Topics Concern   Not on file  Social History Narrative   12/17/21 lives at [facility]      Diet:      Caffeine: occasionally iced coffee      Married, if yes what year: Divorced      Do you live in a house, apartment, assisted living, condo, trailer, ect: House      Is it one or more stories: 1      How many persons live in your home? 1      Pets: Dog      Highest level or education completed: Some college      Current/Past profession: Development worker, community      Exercise:  No                Type and how often:          Living Will: Yes   DNR: No and would like to discuss   POA/HPOA:  Yes      Functional Status:   Do you have difficulty bathing or dressing yourself? No   Do you have difficulty preparing food or eating? Yes, occasionally   Do you have difficulty managing your medications? Yes   Do you have difficulty managing your finances? Yes ( forget password and double pays   Do you have difficulty affording your medications? No   Social Determinants of Health   Financial Resource Strain: Low Risk  (09/30/2021)   Overall Financial Resource Strain (CARDIA)    Difficulty of Paying Living Expenses: Not hard at all  Food Insecurity: No Food Insecurity (09/30/2021)   Hunger Vital Sign    Worried About Running Out of Food in the Last Year: Never true    Ran Out of Food in the Last Year: Never true  Transportation Needs: Unknown (09/30/2021)   PRAPARE - Transportation    Lack of Transportation (Medical): No    Lack of Transportation (Non-Medical): Patient declined  Physical Activity: Unknown (09/30/2021)   Exercise Vital Sign    Days of Exercise per Week: 0 days    Minutes of Exercise per Session: Not on file  Stress: Stress Concern Present (09/30/2021)   [employer] of Occupational Health - Occupational Stress Questionnaire    Feeling of Stress : Very much  Social Connections: Socially Isolated (09/30/2021)   Social Connection and Isolation Panel [NHANES]    Frequency of Communication with Friends and Family: More than three times a week    Frequency of Social Gatherings with Friends and Family: Three times a week    Attends Religious Services: Never    Active Member of Clubs or Organizations: No    Attends Engineer, structural: Not on file    Marital Status: Divorced  Catering manager Violence: Not on file    Family History  Problem Relation Age of Onset   Colon polyps Mother    Heart disease Mother    Heart attack Mother    Heart attack Father    Colon cancer Father 61   Heart disease Father    COPD Sister    Emphysema Sister    COPD  Brother    Emphysema Brother    Lung cancer Brother    Breast cancer Paternal Grandmother    Heart disease Paternal Grandfather    Colon polyps Paternal Grandfather    Heart disease Other  family history   Parkinson's disease Other    Stomach cancer Neg Hx    Rectal cancer Neg Hx    Esophageal cancer Neg Hx     Current Outpatient Medications on File Prior to Visit  Medication Sig Dispense Refill   carbidopa-levodopa (SINEMET IR) 25-100 MG tablet Take 3 tablets by mouth daily.     metoprolol tartrate (LOPRESSOR) 25 MG tablet Take 25 mg by mouth daily.     mirtazapine (REMERON) 15 MG tablet Take 15 mg by mouth at bedtime.     OVER THE COUNTER MEDICATION as needed. PURZ for sleeping     rOPINIRole (REQUIP) 4 MG tablet Take 1 mg by mouth daily.     sertraline (ZOLOFT) 100 MG tablet Take 100 mg by mouth in the morning.     traZODone (DESYREL) 150 MG tablet Take 1 tablet (150 mg total) by mouth at bedtime as needed for sleep. (Patient taking differently: Take 100 mg by mouth at bedtime.) 30 tablet 2   HYDROcodone-acetaminophen (NORCO) 5-325 MG tablet Take 1-2 tablets by mouth every 6 (six) hours as needed. (Patient not taking: Reported on 07/16/2023) 15 tablet 0   Melatonin 10 MG TABS Take 5-10 mg by mouth at bedtime. (Patient not taking: Reported on 07/16/2023)     No current facility-administered medications on file prior to visit.    Allergies  Allergen Reactions   Crestor [Rosuvastatin Calcium] Other (See Comments)    abd pain   Cymbalta [Duloxetine Hcl] Nausea Only   Effexor Xr [Venlafaxine Hcl Er] Other (See Comments)    dizziness   Morphine And Codeine Itching    agitation   Pravastatin Other (See Comments)    abd pain   Promethazine-Codeine Itching    Itching in face   Rizatriptan Other (See Comments)    Tingling, prickly sensation, feelings of palpitations, flushing.        Physical Exam Vitals requested from patient and listed below if patient had equipment  and was able to obtain at home for this virtual visit: Vitals:   Estimated body mass index is 27.02 kg/m as calculated from the following:   Height as of this encounter: 5\' 1"  (1.549 m).   Weight as of this encounter: 143 lb (64.9 kg).  EKG (optional): deferred due to virtual visit  GENERAL: alert, oriented, no acute distress detected, full vision exam deferred due to pandemic and/or virtual encounter  HEENT: atraumatic, conjunttiva clear, no obvious abnormalities on inspection of external nose and ears  NECK: normal movements of the head and neck  LUNGS: on inspection no signs of respiratory distress, breathing rate appears normal, no obvious gross SOB, gasping or wheezing  CV: no obvious cyanosis  MS: moves all visible extremities without noticeable abnormality  PSYCH/NEURO: pleasant and cooperative, no obvious depression or anxiety, speech and thought processing grossly intact, Cognitive function grossly intact  Flowsheet Row Office Visit from [facility]16/2023 [facility] [place]  PHQ-9 Total Score 18           07/16/2023   10:16 AM 05/07/2023   12:10 PM 10/04/2021   10:31 AM 09/12/2021   11:26 AM 06/20/2021   10:02 AM  Depression screen PHQ 2/9  Decreased Interest 3 3 3 2 3   Down, Depressed, Hopeless 3 3 2 1 3   PHQ - 2 Score 6 6 5 3 6   Altered sleeping 3 2 3 3 3   Tired, decreased energy 3 3 3 3 3   Change in appetite 0  2 3 2    Feeling bad or failure about yourself  3 3 0 1 0  Trouble concentrating 0 3 2 2 2   Moving slowly or fidgety/restless 3 2 0 2 3  Suicidal thoughts 0 2 0 0 0  PHQ-9 Score 18 23 16 16 17   Difficult doing work/chores Extremely dIfficult Extremely dIfficult   Somewhat difficult  Reports depression is chronic was worse since fall and now improving, no SI, on medication. Feels is doing ok and declines counseling - she is at [facility] and has counseling there if needs it.      11/19/2021    8:40 AM 05/14/2022   11:59 AM 10/26/2022     5:44 PM 05/07/2023   11:25 AM 07/16/2023   10:15 AM  Fall Risk  Falls in the past year?    1 1  Was there an injury with Fall?    1 1  Fall Risk Category Calculator    2 2  (RETIRED) Patient Fall Risk Level High fall risk Low fall risk Low fall risk    Patient at Risk for Falls Due to     Other (Comment)  Fall risk Follow up    Falls evaluation completed Falls evaluation completed     SUMMARY AND PLAN:   Discussed applicable health maintenance/preventive health measures and advised and referred or ordered per patient preferences: -she agrees to get her flu and covid shots at the ALF -she declined a bone density  Health Maintenance  Topic Date Due   INFLUENZA VACCINE  05/14/2023   COVID-19 Vaccine (5 - 2023-24 season) 06/14/2023   DTaP/Tdap/Td (2 - Td or Tdap) 12/15/2023   Medicare Annual Wellness (AWV)  07/15/2024   Pneumonia Vaccine 57+ Years old  Completed   DEXA SCAN  Completed   Hepatitis C Screening  Completed   Zoster Vaccines- Shingrix  Completed   HPV VACCINES  Aged Out   Colonoscopy  Discontinued      Education and counseling on the following was provided based on the above review of health and a plan/checklist for the patient, along with additional information discussed, was provided for the patient in the patient instructions :   -Provided counseling and plan for increased risk of falling if applicable per above screening. Reviewed and demonstrated safe balance exercises that can be done at home to improve balance and discussed exercise guidelines for adults with include balance exercises at least 3 days per week.  -Advised and counseled on a healthy lifestyle  -offered counseling, help for depression - she feels doing ok and reports has access to counselor at ALF if feels she needs it. Declines for now.  -Reviewed patient's current diet. Advised and counseled on a whole foods based healthy diet. A summary of a healthy diet was provided in the Patient  Instructions. She only has access to fridge/freezer/microwave. Discussed ways to get more veggies, whole foods with theses limitations -reviewed patient's current physical activity level and discussed exercise guidelines for adults. Discussed community resources and ideas for safe exercise at home to assist in meeting exercise guideline recommendations in a safe and healthy way. She might consider spinning for parkinsons, balance exercises and is doing PT for her shoulder currently. Also is doing balance exercises with PT and encouraged her to ask PT to get exercise sheets to do at home between visits.  -Advise yearly dental visits at minimum and regular eye exams   Follow up: see patient instructions     Patient Instructions  I really enjoyed getting to talk with you today! I am available on Tuesdays and Thursdays for virtual visits if you have any questions or concerns, or if I can be of any further assistance.   CHECKLIST FROM ANNUAL WELLNESS VISIT:  -Follow up (please call to schedule if not scheduled after visit):   -yearly for annual wellness visit with primary care office  Here is a list of your preventive care/health maintenance measures and the plan for each if any are due:  PLAN For any measures below that may be due:  -due for flu and covid updated vaccines -let us know if you change your mind about the bone density test  Health Maintenance  Topic Date Due   INFLUENZA VACCINE  05/14/2023   COVID-19 Vaccine (5 - 2023-24 season) 06/14/2023   DTaP/Tdap/Td (2 - Td or Tdap) 12/15/2023   Medicare Annual Wellness (AWV)  07/15/2024   Pneumonia Vaccine 35+ Years old  Completed   DEXA SCAN  Completed   Hepatitis C Screening  Completed   Zoster Vaccines- Shingrix  Completed   HPV VACCINES  Aged Out   Colonoscopy  Discontinued    -See a dentist at least yearly  -Get your eyes checked and then per your eye specialist's recommendations  -Other issues addressed today:   -I  have included below further information regarding a healthy whole foods based diet, physical activity guidelines for adults, stress management and opportunities for social connections. I hope you find this information useful.   -----------------------------------------------------------------------------------------------------------------------------------------------------------------------------------------------------------------------------------------------------------  NUTRITION: -eat real food: lots of colorful vegetables (half the plate) and fruits -5-7 servings of vegetables and fruits per day (fresh or steamed is best), exp. 2 servings of vegetables with lunch and dinner and 2 servings of fruit per day. Berries and greens such as kale and collards are great choices.  -consume on a regular basis: whole grains (make sure first ingredient on label contains the word "whole"), fresh fruits, fish, nuts, seeds, healthy oils (such as olive oil, avocado oil, grape seed oil) -may eat small amounts of dairy and lean meat on occasion, but avoid processed meats such as ham, bacon, lunch meat, etc. -drink water -try to avoid fast food and pre-packaged foods, processed meat -most experts advise limiting sodium to < 2300mg  per day, should limit further is any chronic conditions such as high blood pressure, heart disease, diabetes, etc. The American Heart Association advised that < 1500mg  is is ideal -try to avoid foods that contain any ingredients with names you do not recognize  -try to avoid sugar/sweets (except for the natural sugar that occurs in fresh fruit) -try to avoid sweet drinks -try to avoid white rice, white bread, pasta (unless whole grain), white or yellow potatoes  EXERCISE GUIDELINES FOR ADULTS: -if you wish to increase your physical activity, do so gradually and with the approval of your doctor -STOP and seek medical care immediately if you have any chest pain, chest discomfort or  trouble breathing when starting or increasing exercise  -move and stretch your body, legs, feet and arms when sitting for long periods -Physical activity guidelines for optimal health in adults: -least 150 minutes per week of aerobic exercise (can talk, but not sing) once approved by your doctor, 20-30 minutes of sustained activity or two 10 minute episodes of sustained activity every day.  -resistance training at least 2 days per week if approved by your doctor -balance exercises 3+ days per week:   Stand somewhere where you have something sturdy to  hold onto if you lose balance.    1) lift up on toes, start with 5x per day and work up to 20x   2) stand and lift on leg straight out to the side so that foot is a few inches of the floor, start with 5x each side and work up to 20x each side   3) stand on one foot, start with 5 seconds each side and work up to 20 seconds on each side  If you need ideas or help with getting more active:  -Silver sneakers https://tools.silversneakers.com  -Walk with a Doc: http://www.duncan-williams.com/  -try to include resistance (weight lifting/strength building) and balance exercises twice per week: or the following link for ideas: http://castillo-powell.com/  BuyDucts.dk  STRESS MANAGEMENT: -can try meditating, or just sitting quietly with deep breathing while intentionally relaxing all parts of your body for 5 minutes daily -if you need further help with stress, anxiety or depression please follow up with your primary doctor or contact the wonderful folks at [facility]: [phone]  SOCIAL CONNECTIONS: -options in [place] if you wish to engage in more social and exercise related activities:  -Silver sneakers https://tools.silversneakers.com  -Walk with a Doc: http://www.duncan-williams.com/  -Check out the [facility] Active Adults 50+ section on the  [place] [employer] (hiking clubs, book clubs, cards and games, chess, exercise classes, aquatic classes and much more) - see the website for details: https://www.[place].gov/departments/parks-recreation/active-adults50  -YouTube has lots of exercise videos for different ages and abilities as well  -[facility] (a variety of indoor and outdoor inperson activities for adults). 313-032-3083. [address]  -Virtual Online Classes (a variety of topics): see seniorplanet.org or call ([phone])495-7390  -consider volunteering at a school, hospice center, church, senior center or elsewhere           Terressa Koyanagi, DO

## 2023-07-16 NOTE — Patient Instructions (Signed)
I really enjoyed getting to talk with you today! I am available on Tuesdays and Thursdays for virtual visits if you have any questions or concerns, or if I can be of any further assistance.   CHECKLIST FROM ANNUAL WELLNESS VISIT:  -Follow up (please call to schedule if not scheduled after visit):   -yearly for annual wellness visit with primary care office  Here is a list of your preventive care/health maintenance measures and the plan for each if any are due:  PLAN For any measures below that may be due:  -due for flu and covid updated vaccines -let us know if you change your mind about the bone density test  Health Maintenance  Topic Date Due   INFLUENZA VACCINE  05/14/2023   COVID-19 Vaccine (5 - 2023-24 season) 06/14/2023   DTaP/Tdap/Td (2 - Td or Tdap) 12/15/2023   Medicare Annual Wellness (AWV)  07/15/2024   Pneumonia Vaccine 73+ Years old  Completed   DEXA SCAN  Completed   Hepatitis C Screening  Completed   Zoster Vaccines- Shingrix  Completed   HPV VACCINES  Aged Out   Colonoscopy  Discontinued    -See a dentist at least yearly  -Get your eyes checked and then per your eye specialist's recommendations  -Other issues addressed today:   -I have included below further information regarding a healthy whole foods based diet, physical activity guidelines for adults, stress management and opportunities for social connections. I hope you find this information useful.   -----------------------------------------------------------------------------------------------------------------------------------------------------------------------------------------------------------------------------------------------------------  NUTRITION: -eat real food: lots of colorful vegetables (half the plate) and fruits -5-7 servings of vegetables and fruits per day (fresh or steamed is best), exp. 2 servings of vegetables with lunch and dinner and 2 servings of fruit per day. Berries and greens  such as kale and collards are great choices.  -consume on a regular basis: whole grains (make sure first ingredient on label contains the word "whole"), fresh fruits, fish, nuts, seeds, healthy oils (such as olive oil, avocado oil, grape seed oil) -may eat small amounts of dairy and lean meat on occasion, but avoid processed meats such as ham, bacon, lunch meat, etc. -drink water -try to avoid fast food and pre-packaged foods, processed meat -most experts advise limiting sodium to < 2300mg  per day, should limit further is any chronic conditions such as high blood pressure, heart disease, diabetes, etc. The American Heart Association advised that < 1500mg  is is ideal -try to avoid foods that contain any ingredients with names you do not recognize  -try to avoid sugar/sweets (except for the natural sugar that occurs in fresh fruit) -try to avoid sweet drinks -try to avoid white rice, white bread, pasta (unless whole grain), white or yellow potatoes  EXERCISE GUIDELINES FOR ADULTS: -if you wish to increase your physical activity, do so gradually and with the approval of your doctor -STOP and seek medical care immediately if you have any chest pain, chest discomfort or trouble breathing when starting or increasing exercise  -move and stretch your body, legs, feet and arms when sitting for long periods -Physical activity guidelines for optimal health in adults: -least 150 minutes per week of aerobic exercise (can talk, but not sing) once approved by your doctor, 20-30 minutes of sustained activity or two 10 minute episodes of sustained activity every day.  -resistance training at least 2 days per week if approved by your doctor -balance exercises 3+ days per week:   Stand somewhere where you have something sturdy to hold  onto if you lose balance.    1) lift up on toes, start with 5x per day and work up to 20x   2) stand and lift on leg straight out to the side so that foot is a few inches of the  floor, start with 5x each side and work up to 20x each side   3) stand on one foot, start with 5 seconds each side and work up to 20 seconds on each side  If you need ideas or help with getting more active:  -Silver sneakers https://tools.silversneakers.com  -Walk with a Doc: http://www.duncan-williams.com/  -try to include resistance (weight lifting/strength building) and balance exercises twice per week: or the following link for ideas: http://castillo-powell.com/  BuyDucts.dk  STRESS MANAGEMENT: -can try meditating, or just sitting quietly with deep breathing while intentionally relaxing all parts of your body for 5 minutes daily -if you need further help with stress, anxiety or depression please follow up with your primary doctor or contact the wonderful folks at [facility]: [phone]  SOCIAL CONNECTIONS: -options in [place] if you wish to engage in more social and exercise related activities:  -Silver sneakers https://tools.silversneakers.com  -Walk with a Doc: http://www.duncan-williams.com/  -Check out the [employer] Active Adults 50+ section on the [place] [employer] (hiking clubs, book clubs, cards and games, chess, exercise classes, aquatic classes and much more) - see the website for details: https://www.[place].gov/departments/parks-recreation/active-adults50  -YouTube has lots of exercise videos for different ages and abilities as well  -[facility] (a variety of indoor and outdoor inperson activities for adults). [phone]. [address]  -Virtual Online Classes (a variety of topics): see seniorplanet.org or call [phone]  -consider volunteering at a school, hospice center, church, senior center or elsewhere

## 2023-07-20 ENCOUNTER — Other Ambulatory Visit: Payer: Self-pay

## 2023-07-20 ENCOUNTER — Ambulatory Visit: Payer: Medicare HMO | Admitting: Orthopedic Surgery

## 2023-07-20 DIAGNOSIS — M75101 Unspecified rotator cuff tear or rupture of right shoulder, not specified as traumatic: Secondary | ICD-10-CM

## 2023-07-21 ENCOUNTER — Encounter: Payer: Self-pay | Admitting: Orthopedic Surgery

## 2023-07-21 DIAGNOSIS — Z79899 Other long term (current) drug therapy: Secondary | ICD-10-CM | POA: Diagnosis not present

## 2023-07-21 DIAGNOSIS — G4700 Insomnia, unspecified: Secondary | ICD-10-CM | POA: Diagnosis not present

## 2023-07-21 DIAGNOSIS — S065X0D Traumatic subdural hemorrhage without loss of consciousness, subsequent encounter: Secondary | ICD-10-CM | POA: Diagnosis not present

## 2023-07-21 DIAGNOSIS — G20A1 Parkinson's disease without dyskinesia, without mention of fluctuations: Secondary | ICD-10-CM | POA: Diagnosis not present

## 2023-07-21 DIAGNOSIS — G2581 Restless legs syndrome: Secondary | ICD-10-CM | POA: Diagnosis not present

## 2023-07-21 DIAGNOSIS — F419 Anxiety disorder, unspecified: Secondary | ICD-10-CM | POA: Diagnosis not present

## 2023-07-21 DIAGNOSIS — M354 Diffuse (eosinophilic) fasciitis: Secondary | ICD-10-CM | POA: Diagnosis not present

## 2023-07-21 DIAGNOSIS — E7800 Pure hypercholesterolemia, unspecified: Secondary | ICD-10-CM | POA: Diagnosis not present

## 2023-07-21 DIAGNOSIS — Z860100 Personal history of colon polyps, unspecified: Secondary | ICD-10-CM | POA: Diagnosis not present

## 2023-07-21 DIAGNOSIS — M25511 Pain in right shoulder: Secondary | ICD-10-CM | POA: Diagnosis not present

## 2023-07-21 DIAGNOSIS — F32A Depression, unspecified: Secondary | ICD-10-CM | POA: Diagnosis not present

## 2023-07-21 DIAGNOSIS — K5900 Constipation, unspecified: Secondary | ICD-10-CM | POA: Diagnosis not present

## 2023-07-21 DIAGNOSIS — M797 Fibromyalgia: Secondary | ICD-10-CM | POA: Diagnosis not present

## 2023-07-21 DIAGNOSIS — R3981 Functional urinary incontinence: Secondary | ICD-10-CM | POA: Diagnosis not present

## 2023-07-21 DIAGNOSIS — M791 Myalgia, unspecified site: Secondary | ICD-10-CM | POA: Diagnosis not present

## 2023-07-21 DIAGNOSIS — G4730 Sleep apnea, unspecified: Secondary | ICD-10-CM | POA: Diagnosis not present

## 2023-07-21 DIAGNOSIS — R296 Repeated falls: Secondary | ICD-10-CM | POA: Diagnosis not present

## 2023-07-21 DIAGNOSIS — I10 Essential (primary) hypertension: Secondary | ICD-10-CM | POA: Diagnosis not present

## 2023-07-21 DIAGNOSIS — Z90710 Acquired absence of both cervix and uterus: Secondary | ICD-10-CM | POA: Diagnosis not present

## 2023-07-21 DIAGNOSIS — Z9849 Cataract extraction status, unspecified eye: Secondary | ICD-10-CM | POA: Diagnosis not present

## 2023-07-21 NOTE — Progress Notes (Unsigned)
Office Visit Note   Patient: Shannon Sutton           Date of Birth: [birth year]           MRN: [id number] Visit Date: 07/20/2023 Requested by: Shannon Sutton, Shannon Patricia, MD [address] [place],  [place] [address] PCP: Shannon Sutton, Shannon Patricia, MD  Subjective: Chief Complaint  Patient presents with   Other     Scan review    HPI: Shannon Sutton is a 77 y.o. female who presents to the office reporting continued right shoulder pain.  She is right-hand dominant.  Hard for her to write.  Patient has got a little bit worse than the 70-month since her injury.  She has been doing pendulum exercises.  Does not have any cardiac or diabetes issues.  She does have a full-thickness rotator cuff tear of the supra and infra.  She is here with her daughter.  In general Shannon Sutton is fairly miserable and is not really showing much progress with nonoperative treatment.  She wants to avoid surgery if possible..                ROS: All systems reviewed are negative as they relate to the chief complaint within the history of present illness.  Patient denies fevers or chills.  Assessment & Plan: Visit Diagnoses:  1. Tear of right rotator cuff, unspecified tear extent, unspecified whether traumatic     Plan: Impression is right shoulder rotator cuff arthropathy.  Right glenohumeral joint injection performed.  Will see how that does with pain week.  I do not think that is can help her function any.  4-week return with decision for or against reverse shoulder replacement at that time.  Thin cut CT scan to be ordered in case she decides she wants to get that done within the next 6 months.  I think she is likely heading for that intervention based on her lack of progress with motion and pain over the last 3 months.  Follow-Up Instructions: No follow-ups on file.   Orders:  Orders Placed This Encounter  Procedures   US Guided Needle Placement - No Linked Charges   CT SHOULDER RIGHT WO CONTRAST   No  orders of the defined types were placed in this encounter.     Procedures: Large Joint Inj: R glenohumeral on 07/20/2023 5:38 PM Indications: diagnostic evaluation and pain Details: 22 G 1.5 in needle, ultrasound-guided posterior approach  Arthrogram: No  Medications: 9 mL bupivacaine 0.5 %; 40 mg methylPREDNISolone acetate 40 MG/ML; 5 mL lidocaine 1 % Outcome: tolerated well, no immediate complications Procedure, treatment alternatives, risks and benefits explained, specific risks discussed. Consent was given by the patient. Immediately prior to procedure a time out was called to verify the correct patient, procedure, equipment, support staff and site/side marked as required. Patient was prepped and draped in the usual sterile fashion.       Clinical Data: No additional findings.  Objective: Vital Signs: There were no vitals taken for this visit.  Physical Exam:  Constitutional: Patient appears well-developed HEENT:  Head: Normocephalic Eyes:EOM are normal Neck: Normal range of motion Cardiovascular: Normal rate Pulmonary/chest: Effort normal Neurologic: Patient is alert Skin: Skin is warm Psychiatric: Patient has normal mood and affect  Ortho Exam: Ortho exam demonstrates forward flexion and abduction both to about 40 degrees.  Does have weakness to external rotation at 2-3 out of 5 with pretty reasonable subscap strength 5- out of 5.  Does  have some tenderness in the bicipital groove.  Deltoid is functional.  Passive range of motion is reasonably well-maintained but painful for the patient.  Specialty Comments:  No specialty comments available.  Imaging: No results found.   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   Recurrent  Dr Nickola Major 2009 had a bx/MRI: was on MTX and Prednisone   External hemorrhoids    Fibromyalgia with myofascial pain 11/16/2012   Headaches, cluster    History of CT scan of head 2022   History of EKG 2021   History of mammogram 2022   History of MRI 2022   Brain   Hypercholesterolemia 12/15/2013   Hypertension    Insomnia 11/16/2012   Myalgia and myositis, unspecified    Other disorder of muscle, ligament, and fascia    eosinophilic fascitis   Other specified disease of hair and hair follicles    Pain in joint, multiple sites    Parkinson's disease (HCC)    Restless leg syndrome 12/15/2013   Unspecified sleep apnea    Urine incontinence     Family History  Problem Relation Age of Onset   Colon polyps Mother    Heart disease Mother    Heart attack Mother    Heart attack Father    Colon cancer Father 75   Heart disease Father    COPD Sister    Emphysema Sister    COPD Brother    Emphysema Brother    Lung cancer Brother    Breast cancer Paternal Grandmother    Heart disease Paternal Grandfather    Colon polyps Paternal Grandfather    Heart disease Other        family history   Parkinson's disease Other    Stomach cancer Neg Hx    Rectal cancer Neg Hx    Esophageal cancer Neg Hx     Past Surgical History:  Procedure Laterality Date   ABDOMINAL HYSTERECTOMY     ABDOMINOPLASTY  2006   "tummy tuck"   BREAST REDUCTION SURGERY  1995   BREAST SURGERY     CATARACT EXTRACTION, BILATERAL     COLONOSCOPY  2018   MUSCLE BIOPSY  2009   DEEP TISSUE   REFRACTIVE SURGERY     TONSILLECTOMY     TOTAL ABDOMINAL HYSTERECTOMY  1999   WISDOM TOOTH EXTRACTION     Social History   Occupational History   Occupation: Retired    Associate Professor: RETIRED     Comment: Development worker, community  Tobacco Use   Smoking status: Former    Current packs/day: 0.00    Types: Cigarettes    Quit date: 12/19/1986    Years since quitting: 36.6   Smokeless tobacco: Never  Vaping Use   Vaping status: Never Used  Substance and Sexual Activity   Alcohol use: Not Currently   Drug use: No   Sexual activity: Yes    Birth control/protection: Surgical, Post-menopausal

## 2023-07-23 MED ORDER — METHYLPREDNISOLONE ACETATE 40 MG/ML IJ SUSP
40.0000 mg | INTRAMUSCULAR | Status: AC | PRN
  Administered 2023-07-20: 40 mg via INTRA_ARTICULAR

## 2023-07-23 MED ORDER — LIDOCAINE HCL 1 % IJ SOLN
5.0000 mL | INTRAMUSCULAR | Status: AC | PRN
  Administered 2023-07-20: 5 mL

## 2023-07-23 MED ORDER — BUPIVACAINE HCL 0.5 % IJ SOLN
9.0000 mL | INTRAMUSCULAR | Status: AC | PRN
  Administered 2023-07-20: 9 mL via INTRA_ARTICULAR

## 2023-07-31 DIAGNOSIS — M791 Myalgia, unspecified site: Secondary | ICD-10-CM | POA: Diagnosis not present

## 2023-07-31 DIAGNOSIS — Z79899 Other long term (current) drug therapy: Secondary | ICD-10-CM | POA: Diagnosis not present

## 2023-07-31 DIAGNOSIS — M25511 Pain in right shoulder: Secondary | ICD-10-CM | POA: Diagnosis not present

## 2023-07-31 DIAGNOSIS — S065X0D Traumatic subdural hemorrhage without loss of consciousness, subsequent encounter: Secondary | ICD-10-CM | POA: Diagnosis not present

## 2023-07-31 DIAGNOSIS — E7800 Pure hypercholesterolemia, unspecified: Secondary | ICD-10-CM | POA: Diagnosis not present

## 2023-07-31 DIAGNOSIS — R296 Repeated falls: Secondary | ICD-10-CM | POA: Diagnosis not present

## 2023-07-31 DIAGNOSIS — K5900 Constipation, unspecified: Secondary | ICD-10-CM | POA: Diagnosis not present

## 2023-07-31 DIAGNOSIS — G2581 Restless legs syndrome: Secondary | ICD-10-CM | POA: Diagnosis not present

## 2023-07-31 DIAGNOSIS — Z9849 Cataract extraction status, unspecified eye: Secondary | ICD-10-CM | POA: Diagnosis not present

## 2023-07-31 DIAGNOSIS — G4730 Sleep apnea, unspecified: Secondary | ICD-10-CM | POA: Diagnosis not present

## 2023-07-31 DIAGNOSIS — M354 Diffuse (eosinophilic) fasciitis: Secondary | ICD-10-CM | POA: Diagnosis not present

## 2023-07-31 DIAGNOSIS — R3981 Functional urinary incontinence: Secondary | ICD-10-CM | POA: Diagnosis not present

## 2023-07-31 DIAGNOSIS — F419 Anxiety disorder, unspecified: Secondary | ICD-10-CM | POA: Diagnosis not present

## 2023-07-31 DIAGNOSIS — Z860100 Personal history of colon polyps, unspecified: Secondary | ICD-10-CM | POA: Diagnosis not present

## 2023-07-31 DIAGNOSIS — Z90710 Acquired absence of both cervix and uterus: Secondary | ICD-10-CM | POA: Diagnosis not present

## 2023-07-31 DIAGNOSIS — G20A1 Parkinson's disease without dyskinesia, without mention of fluctuations: Secondary | ICD-10-CM | POA: Diagnosis not present

## 2023-07-31 DIAGNOSIS — I10 Essential (primary) hypertension: Secondary | ICD-10-CM | POA: Diagnosis not present

## 2023-07-31 DIAGNOSIS — F32A Depression, unspecified: Secondary | ICD-10-CM | POA: Diagnosis not present

## 2023-07-31 DIAGNOSIS — M797 Fibromyalgia: Secondary | ICD-10-CM | POA: Diagnosis not present

## 2023-07-31 DIAGNOSIS — G4700 Insomnia, unspecified: Secondary | ICD-10-CM | POA: Diagnosis not present

## 2023-08-05 DIAGNOSIS — I10 Essential (primary) hypertension: Secondary | ICD-10-CM | POA: Diagnosis not present

## 2023-08-05 DIAGNOSIS — Z860100 Personal history of colon polyps, unspecified: Secondary | ICD-10-CM | POA: Diagnosis not present

## 2023-08-05 DIAGNOSIS — E7800 Pure hypercholesterolemia, unspecified: Secondary | ICD-10-CM | POA: Diagnosis not present

## 2023-08-05 DIAGNOSIS — R296 Repeated falls: Secondary | ICD-10-CM | POA: Diagnosis not present

## 2023-08-05 DIAGNOSIS — Z90710 Acquired absence of both cervix and uterus: Secondary | ICD-10-CM | POA: Diagnosis not present

## 2023-08-05 DIAGNOSIS — M791 Myalgia, unspecified site: Secondary | ICD-10-CM | POA: Diagnosis not present

## 2023-08-05 DIAGNOSIS — G4730 Sleep apnea, unspecified: Secondary | ICD-10-CM | POA: Diagnosis not present

## 2023-08-05 DIAGNOSIS — Z79899 Other long term (current) drug therapy: Secondary | ICD-10-CM | POA: Diagnosis not present

## 2023-08-05 DIAGNOSIS — G4700 Insomnia, unspecified: Secondary | ICD-10-CM | POA: Diagnosis not present

## 2023-08-05 DIAGNOSIS — M354 Diffuse (eosinophilic) fasciitis: Secondary | ICD-10-CM | POA: Diagnosis not present

## 2023-08-05 DIAGNOSIS — M797 Fibromyalgia: Secondary | ICD-10-CM | POA: Diagnosis not present

## 2023-08-05 DIAGNOSIS — R3981 Functional urinary incontinence: Secondary | ICD-10-CM | POA: Diagnosis not present

## 2023-08-05 DIAGNOSIS — Z9849 Cataract extraction status, unspecified eye: Secondary | ICD-10-CM | POA: Diagnosis not present

## 2023-08-05 DIAGNOSIS — M25511 Pain in right shoulder: Secondary | ICD-10-CM | POA: Diagnosis not present

## 2023-08-05 DIAGNOSIS — S065X0D Traumatic subdural hemorrhage without loss of consciousness, subsequent encounter: Secondary | ICD-10-CM | POA: Diagnosis not present

## 2023-08-05 DIAGNOSIS — G2581 Restless legs syndrome: Secondary | ICD-10-CM | POA: Diagnosis not present

## 2023-08-05 DIAGNOSIS — F32A Depression, unspecified: Secondary | ICD-10-CM | POA: Diagnosis not present

## 2023-08-05 DIAGNOSIS — G20A1 Parkinson's disease without dyskinesia, without mention of fluctuations: Secondary | ICD-10-CM | POA: Diagnosis not present

## 2023-08-05 DIAGNOSIS — K5900 Constipation, unspecified: Secondary | ICD-10-CM | POA: Diagnosis not present

## 2023-08-05 DIAGNOSIS — F419 Anxiety disorder, unspecified: Secondary | ICD-10-CM | POA: Diagnosis not present

## 2023-08-06 ENCOUNTER — Telehealth: Payer: Self-pay | Admitting: Internal Medicine

## 2023-08-06 NOTE — Telephone Encounter (Signed)
Verbal orders given to [facility] health

## 2023-08-06 NOTE — Telephone Encounter (Signed)
Reduce frequency of OT to every other week for 4 weeks. Can leave a message on secured line if she doesn't answer

## 2023-08-19 DIAGNOSIS — Z90710 Acquired absence of both cervix and uterus: Secondary | ICD-10-CM | POA: Diagnosis not present

## 2023-08-19 DIAGNOSIS — E7800 Pure hypercholesterolemia, unspecified: Secondary | ICD-10-CM | POA: Diagnosis not present

## 2023-08-19 DIAGNOSIS — K5900 Constipation, unspecified: Secondary | ICD-10-CM | POA: Diagnosis not present

## 2023-08-19 DIAGNOSIS — R296 Repeated falls: Secondary | ICD-10-CM | POA: Diagnosis not present

## 2023-08-19 DIAGNOSIS — Z860100 Personal history of colon polyps, unspecified: Secondary | ICD-10-CM | POA: Diagnosis not present

## 2023-08-19 DIAGNOSIS — Z79899 Other long term (current) drug therapy: Secondary | ICD-10-CM | POA: Diagnosis not present

## 2023-08-19 DIAGNOSIS — G4700 Insomnia, unspecified: Secondary | ICD-10-CM | POA: Diagnosis not present

## 2023-08-19 DIAGNOSIS — M791 Myalgia, unspecified site: Secondary | ICD-10-CM | POA: Diagnosis not present

## 2023-08-19 DIAGNOSIS — S065X0D Traumatic subdural hemorrhage without loss of consciousness, subsequent encounter: Secondary | ICD-10-CM | POA: Diagnosis not present

## 2023-08-19 DIAGNOSIS — M354 Diffuse (eosinophilic) fasciitis: Secondary | ICD-10-CM | POA: Diagnosis not present

## 2023-08-19 DIAGNOSIS — R3981 Functional urinary incontinence: Secondary | ICD-10-CM | POA: Diagnosis not present

## 2023-08-19 DIAGNOSIS — Z9849 Cataract extraction status, unspecified eye: Secondary | ICD-10-CM | POA: Diagnosis not present

## 2023-08-19 DIAGNOSIS — M25511 Pain in right shoulder: Secondary | ICD-10-CM | POA: Diagnosis not present

## 2023-08-19 DIAGNOSIS — G2581 Restless legs syndrome: Secondary | ICD-10-CM | POA: Diagnosis not present

## 2023-08-19 DIAGNOSIS — F32A Depression, unspecified: Secondary | ICD-10-CM | POA: Diagnosis not present

## 2023-08-19 DIAGNOSIS — G4730 Sleep apnea, unspecified: Secondary | ICD-10-CM | POA: Diagnosis not present

## 2023-08-19 DIAGNOSIS — G20A1 Parkinson's disease without dyskinesia, without mention of fluctuations: Secondary | ICD-10-CM | POA: Diagnosis not present

## 2023-08-19 DIAGNOSIS — F419 Anxiety disorder, unspecified: Secondary | ICD-10-CM | POA: Diagnosis not present

## 2023-08-19 DIAGNOSIS — M797 Fibromyalgia: Secondary | ICD-10-CM | POA: Diagnosis not present

## 2023-08-19 DIAGNOSIS — I10 Essential (primary) hypertension: Secondary | ICD-10-CM | POA: Diagnosis not present

## 2023-08-25 DIAGNOSIS — Z008 Encounter for other general examination: Secondary | ICD-10-CM | POA: Diagnosis not present

## 2023-09-01 DIAGNOSIS — M25511 Pain in right shoulder: Secondary | ICD-10-CM | POA: Diagnosis not present

## 2023-09-01 DIAGNOSIS — R296 Repeated falls: Secondary | ICD-10-CM | POA: Diagnosis not present

## 2023-09-01 DIAGNOSIS — G4700 Insomnia, unspecified: Secondary | ICD-10-CM | POA: Diagnosis not present

## 2023-09-01 DIAGNOSIS — G20A1 Parkinson's disease without dyskinesia, without mention of fluctuations: Secondary | ICD-10-CM | POA: Diagnosis not present

## 2023-09-01 DIAGNOSIS — M791 Myalgia, unspecified site: Secondary | ICD-10-CM | POA: Diagnosis not present

## 2023-09-01 DIAGNOSIS — Z79899 Other long term (current) drug therapy: Secondary | ICD-10-CM | POA: Diagnosis not present

## 2023-09-01 DIAGNOSIS — Z90710 Acquired absence of both cervix and uterus: Secondary | ICD-10-CM | POA: Diagnosis not present

## 2023-09-01 DIAGNOSIS — M797 Fibromyalgia: Secondary | ICD-10-CM | POA: Diagnosis not present

## 2023-09-01 DIAGNOSIS — M354 Diffuse (eosinophilic) fasciitis: Secondary | ICD-10-CM | POA: Diagnosis not present

## 2023-09-01 DIAGNOSIS — K5900 Constipation, unspecified: Secondary | ICD-10-CM | POA: Diagnosis not present

## 2023-09-01 DIAGNOSIS — E7800 Pure hypercholesterolemia, unspecified: Secondary | ICD-10-CM | POA: Diagnosis not present

## 2023-09-01 DIAGNOSIS — G2581 Restless legs syndrome: Secondary | ICD-10-CM | POA: Diagnosis not present

## 2023-09-01 DIAGNOSIS — I10 Essential (primary) hypertension: Secondary | ICD-10-CM | POA: Diagnosis not present

## 2023-09-01 DIAGNOSIS — F32A Depression, unspecified: Secondary | ICD-10-CM | POA: Diagnosis not present

## 2023-09-01 DIAGNOSIS — S065X0D Traumatic subdural hemorrhage without loss of consciousness, subsequent encounter: Secondary | ICD-10-CM | POA: Diagnosis not present

## 2023-09-01 DIAGNOSIS — Z860100 Personal history of colon polyps, unspecified: Secondary | ICD-10-CM | POA: Diagnosis not present

## 2023-09-01 DIAGNOSIS — G4730 Sleep apnea, unspecified: Secondary | ICD-10-CM | POA: Diagnosis not present

## 2023-09-01 DIAGNOSIS — Z9849 Cataract extraction status, unspecified eye: Secondary | ICD-10-CM | POA: Diagnosis not present

## 2023-09-01 DIAGNOSIS — F419 Anxiety disorder, unspecified: Secondary | ICD-10-CM | POA: Diagnosis not present

## 2023-09-01 DIAGNOSIS — R3981 Functional urinary incontinence: Secondary | ICD-10-CM | POA: Diagnosis not present

## 2023-11-12 ENCOUNTER — Ambulatory Visit
Admission: RE | Admit: 2023-11-12 | Discharge: 2023-11-12 | Disposition: A | Discharge status: Home / Self Care | Payer: Medicare HMO | Source: Ambulatory Visit | Attending: Orthopedic Surgery | Admitting: Orthopedic Surgery

## 2023-11-12 DIAGNOSIS — M75101 Unspecified rotator cuff tear or rupture of right shoulder, not specified as traumatic: Secondary | ICD-10-CM

## 2023-11-12 DIAGNOSIS — M19011 Primary osteoarthritis, right shoulder: Secondary | ICD-10-CM | POA: Diagnosis not present

## 2023-11-12 DIAGNOSIS — M75121 Complete rotator cuff tear or rupture of right shoulder, not specified as traumatic: Secondary | ICD-10-CM | POA: Diagnosis not present

## 2024-03-09 DIAGNOSIS — Z9181 History of falling: Secondary | ICD-10-CM | POA: Diagnosis not present

## 2024-03-09 DIAGNOSIS — F411 Generalized anxiety disorder: Secondary | ICD-10-CM | POA: Diagnosis not present

## 2024-03-09 DIAGNOSIS — E785 Hyperlipidemia, unspecified: Secondary | ICD-10-CM | POA: Diagnosis not present

## 2024-03-09 DIAGNOSIS — Z791 Long term (current) use of non-steroidal anti-inflammatories (NSAID): Secondary | ICD-10-CM | POA: Diagnosis not present

## 2024-03-09 DIAGNOSIS — K5900 Constipation, unspecified: Secondary | ICD-10-CM | POA: Diagnosis not present

## 2024-03-09 DIAGNOSIS — Z8249 Family history of ischemic heart disease and other diseases of the circulatory system: Secondary | ICD-10-CM | POA: Diagnosis not present

## 2024-03-09 DIAGNOSIS — Z809 Family history of malignant neoplasm, unspecified: Secondary | ICD-10-CM | POA: Diagnosis not present

## 2024-03-09 DIAGNOSIS — R32 Unspecified urinary incontinence: Secondary | ICD-10-CM | POA: Diagnosis not present

## 2024-03-09 DIAGNOSIS — I10 Essential (primary) hypertension: Secondary | ICD-10-CM | POA: Diagnosis not present

## 2024-03-09 DIAGNOSIS — M1990 Unspecified osteoarthritis, unspecified site: Secondary | ICD-10-CM | POA: Diagnosis not present

## 2024-03-09 DIAGNOSIS — G20A1 Parkinson's disease without dyskinesia, without mention of fluctuations: Secondary | ICD-10-CM | POA: Diagnosis not present

## 2024-03-09 DIAGNOSIS — F319 Bipolar disorder, unspecified: Secondary | ICD-10-CM | POA: Diagnosis not present

## 2024-04-05 DIAGNOSIS — G20A1 Parkinson's disease without dyskinesia, without mention of fluctuations: Secondary | ICD-10-CM | POA: Diagnosis not present

## 2024-04-28 ENCOUNTER — Encounter: Payer: Self-pay | Admitting: Internal Medicine

## 2024-04-28 ENCOUNTER — Ambulatory Visit (INDEPENDENT_AMBULATORY_CARE_PROVIDER_SITE_OTHER): Admitting: Internal Medicine

## 2024-04-28 VITALS — BP 92/64 | HR 94 | Temp 98.1°F | Wt 124.9 lb

## 2024-04-28 DIAGNOSIS — I10 Essential (primary) hypertension: Secondary | ICD-10-CM

## 2024-04-28 DIAGNOSIS — R270 Ataxia, unspecified: Secondary | ICD-10-CM

## 2024-04-28 DIAGNOSIS — F332 Major depressive disorder, recurrent severe without psychotic features: Secondary | ICD-10-CM | POA: Diagnosis not present

## 2024-04-28 DIAGNOSIS — G2581 Restless legs syndrome: Secondary | ICD-10-CM | POA: Diagnosis not present

## 2024-04-28 LAB — CBC WITH DIFFERENTIAL/PLATELET
Basophils Absolute: 0.1 K/uL (ref 0.0–0.1)
Basophils Relative: 1 % (ref 0.0–3.0)
Eosinophils Absolute: 0.1 K/uL (ref 0.0–0.7)
Eosinophils Relative: 1.5 % (ref 0.0–5.0)
HCT: 36.9 % (ref 36.0–46.0)
Hemoglobin: 12.8 g/dL (ref 12.0–15.0)
Lymphocytes Relative: 30.8 % (ref 12.0–46.0)
Lymphs Abs: 1.7 K/uL (ref 0.7–4.0)
MCHC: 34.8 g/dL (ref 30.0–36.0)
MCV: 92.5 fl (ref 78.0–100.0)
Monocytes Absolute: 0.4 K/uL (ref 0.1–1.0)
Monocytes Relative: 7.6 % (ref 3.0–12.0)
Neutro Abs: 3.3 K/uL (ref 1.4–7.7)
Neutrophils Relative %: 59.1 % (ref 43.0–77.0)
Platelets: 271 K/uL (ref 150.0–400.0)
RBC: 3.99 Mil/uL (ref 3.87–5.11)
RDW: 13.3 % (ref 11.5–15.5)
WBC: 5.6 K/uL (ref 4.0–10.5)

## 2024-04-28 LAB — LIPID PANEL
Cholesterol: 272 mg/dL — ABNORMAL HIGH (ref 0–200)
HDL: 65.9 mg/dL (ref >39.00)
LDL Cholesterol: 187 mg/dL — ABNORMAL HIGH (ref 0–99)
NonHDL: 206.48
Total CHOL/HDL Ratio: 4
Triglycerides: 99 mg/dL (ref 0.0–149.0)
VLDL: 19.8 mg/dL (ref 0.0–40.0)

## 2024-04-28 LAB — COMPREHENSIVE METABOLIC PANEL WITH GFR
ALT: 5 U/L (ref 0–35)
AST: 19 U/L (ref 0–37)
Albumin: 4.5 g/dL (ref 3.5–5.2)
Alkaline Phosphatase: 59 U/L (ref 39–117)
BUN: 4 mg/dL — ABNORMAL LOW (ref 6–23)
CO2: 26 meq/L (ref 19–32)
Calcium: 9.3 mg/dL (ref 8.4–10.5)
Chloride: 98 meq/L (ref 96–112)
Creatinine, Ser: 0.57 mg/dL (ref 0.40–1.20)
GFR: 85.92 mL/min (ref >60.00)
Glucose, Bld: 104 mg/dL — ABNORMAL HIGH (ref 70–99)
Potassium: 3.1 meq/L — ABNORMAL LOW (ref 3.5–5.1)
Sodium: 134 meq/L — ABNORMAL LOW (ref 135–145)
Total Bilirubin: 0.6 mg/dL (ref 0.2–1.2)
Total Protein: 6.7 g/dL (ref 6.0–8.3)

## 2024-04-28 LAB — VITAMIN B12: Vitamin B-12: 689 pg/mL (ref 211–911)

## 2024-04-28 NOTE — Progress Notes (Signed)
 Established Patient Office Visit     CC/Reason for Visit: Discuss medications  HPI: Shannon Sutton is a 78 y.o. female who is coming in today for the above mentioned reasons.  History of frequent falls and Parkinson's, now at assisted living.  She has discontinued her Seroquel  and trazodone  for over a month and has been doing well without it.  They also discontinued metoprolol  a while back due to low blood pressures.  They are wanting these medicines removed from her list as she is getting charged for them by the assisted living facility.  She is otherwise doing well, interested in updating lab work.  She did suffer a right rotator cuff tear.   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   Recurrent  Dr Ishmael 2009 had a bx/MRI: was on MTX and Prednisone   External hemorrhoids    Fibromyalgia with myofascial pain 11/16/2012   Headaches, cluster    History of CT scan of head 2022   History of EKG 2021   History of mammogram 2022   History of MRI 2022   Brain   Hypercholesterolemia 12/15/2013   Hypertension    Insomnia 11/16/2012   Myalgia and myositis, unspecified    Other disorder of muscle, ligament, and fascia    eosinophilic fascitis   Other specified disease of hair and hair follicles    Pain in joint, multiple sites    Parkinson's disease (HCC)    Restless leg syndrome 12/15/2013   Unspecified sleep apnea    Urine incontinence     Past Surgical History:  Procedure Laterality Date   ABDOMINAL HYSTERECTOMY     ABDOMINOPLASTY  2006   tummy tuck   BREAST REDUCTION SURGERY  1995   BREAST SURGERY     CATARACT EXTRACTION, BILATERAL     COLONOSCOPY  2018   MUSCLE BIOPSY  2009   DEEP TISSUE   REFRACTIVE SURGERY      TONSILLECTOMY     TOTAL ABDOMINAL HYSTERECTOMY  1999   WISDOM TOOTH EXTRACTION      Social History:  reports that she quit smoking about 37 years ago. Her smoking use included cigarettes. She has never used smokeless tobacco. She reports that she does not currently use alcohol. She reports that she does not use drugs.  Allergies: Allergies  Allergen Reactions   Crestor  [Rosuvastatin  Calcium ] Other (See Comments)    abd pain   Cymbalta  [Duloxetine  Hcl] Nausea Only   Effexor  Xr [Venlafaxine  Hcl Er] Other (See Comments)    dizziness   Morphine And Codeine Itching    agitation   Pravastatin  Other (See Comments)    abd pain   Promethazine-Codeine Itching    Itching in face   Rizatriptan  Other (See Comments)    Tingling, prickly sensation, feelings of palpitations, flushing.     Family History:  Family History  Problem Relation Age of Onset   Colon polyps Mother    Heart disease Mother    Heart attack Mother    Heart attack Father    Colon cancer Father 64   Heart disease Father    COPD Sister    Emphysema Sister    COPD Brother    Emphysema Brother  Lung cancer Brother    Breast cancer Paternal Grandmother    Heart disease Paternal Grandfather    Colon polyps Paternal Grandfather    Heart disease Other        family history   Parkinson's disease Other    Stomach cancer Neg Hx    Rectal cancer Neg Hx    Esophageal cancer Neg Hx      Current Outpatient Medications:    carbidopa -levodopa  (SINEMET  IR) 25-100 MG tablet, Take 3 tablets by mouth daily., Disp: , Rfl:    Melatonin 10 MG TABS, Take 5-10 mg by mouth at bedtime., Disp: , Rfl:    mirtazapine  (REMERON ) 15 MG tablet, Take 15 mg by mouth at bedtime., Disp: , Rfl:    OVER THE COUNTER MEDICATION, as needed. PURZ for sleeping, Disp: , Rfl:    rOPINIRole  (REQUIP ) 4 MG tablet, Take 1 mg by mouth daily., Disp: , Rfl:   Review of Systems:  Negative unless indicated in HPI.   Physical Exam: Vitals:   04/28/24  1100  BP: 92/64  Pulse: 94  Temp: 98.1 F (36.7 C)  TempSrc: Oral  SpO2: 98%  Weight: 124 lb 14.4 oz (56.7 kg)    Body mass index is 23.6 kg/m.   Physical Exam Vitals reviewed.  Constitutional:      Appearance: Normal appearance.  HENT:     Head: Normocephalic and atraumatic.  Eyes:     Conjunctiva/sclera: Conjunctivae normal.  Cardiovascular:     Rate and Rhythm: Normal rate and regular rhythm.  Pulmonary:     Effort: Pulmonary effort is normal.     Breath sounds: Normal breath sounds.  Skin:    General: Skin is warm and dry.  Neurological:     Mental Status: She is alert and oriented to person, place, and time.  Psychiatric:        Mood and Affect: Mood normal.        Behavior: Behavior normal.      Impression and Plan:  Major depressive disorder, recurrent severe without psychotic features (HCC)  Ataxia -     Vitamin B12; Future  Restless leg syndrome  Primary hypertension -     CBC with Differential/Platelet; Future -     Comprehensive metabolic panel with GFR; Future -     Lipid panel; Future   - Okay to discontinue metoprolol , trazodone , Seroquel . - Check labs.  Time spent:30 minutes reviewing chart, interviewing and examining patient and formulating plan of care.     Tully Theophilus Andrews, MD [facility]

## 2024-04-29 ENCOUNTER — Encounter: Payer: Self-pay | Admitting: Internal Medicine

## 2024-05-02 ENCOUNTER — Ambulatory Visit: Payer: Self-pay | Admitting: Internal Medicine

## 2024-05-02 ENCOUNTER — Telehealth: Payer: Self-pay | Admitting: *Deleted

## 2024-05-02 DIAGNOSIS — E782 Mixed hyperlipidemia: Secondary | ICD-10-CM

## 2024-05-02 DIAGNOSIS — E876 Hypokalemia: Secondary | ICD-10-CM

## 2024-05-02 MED ORDER — ROSUVASTATIN CALCIUM 5 MG PO TABS: 5.0000 mg | ORAL_TABLET | Freq: Every day | ORAL | 1 refills | Status: AC

## 2024-05-02 MED ORDER — POTASSIUM CHLORIDE CRYS ER 20 MEQ PO TBCR: 20.0000 meq | EXTENDED_RELEASE_TABLET | Freq: Two times a day (BID) | ORAL | 0 refills | Status: DC

## 2024-05-02 NOTE — Telephone Encounter (Signed)
 Spoke to [employer] and she will have the prescription filled.

## 2024-05-02 NOTE — Telephone Encounter (Signed)
 Copied from CRM [id number]. Topic: Clinical - Prescription Issue >> May 02, 2024  1:43 PM Suzen RAMAN wrote: Reason for CRM: Per patient pharmacy they have an allergy on file for recent medication(rosuvastatin  (CRESTOR ) 5 MG tablet) refill submitted to the pharmacy. Pharmacy seeking alternative medication.  CB# Phone: [phone] Fax: [phone]

## 2024-05-02 NOTE — Telephone Encounter (Signed)
 Okay to write and letter stating that it is okay to discontinue blood pressure Rx and the seroquel  ?

## 2024-05-19 ENCOUNTER — Other Ambulatory Visit (INDEPENDENT_AMBULATORY_CARE_PROVIDER_SITE_OTHER)

## 2024-05-19 DIAGNOSIS — E782 Mixed hyperlipidemia: Secondary | ICD-10-CM | POA: Diagnosis not present

## 2024-05-19 DIAGNOSIS — E876 Hypokalemia: Secondary | ICD-10-CM | POA: Diagnosis not present

## 2024-05-19 LAB — LIPID PANEL
Cholesterol: 211 mg/dL — ABNORMAL HIGH (ref 0–200)
HDL: 79.5 mg/dL (ref >39.00)
LDL Cholesterol: 106 mg/dL — ABNORMAL HIGH (ref 0–99)
NonHDL: 131.43
Total CHOL/HDL Ratio: 3
Triglycerides: 129 mg/dL (ref 0.0–149.0)
VLDL: 25.8 mg/dL (ref 0.0–40.0)

## 2024-05-19 LAB — BASIC METABOLIC PANEL WITH GFR
BUN: 4 mg/dL — ABNORMAL LOW (ref 6–23)
CO2: 25 meq/L (ref 19–32)
Calcium: 9.5 mg/dL (ref 8.4–10.5)
Chloride: 96 meq/L (ref 96–112)
Creatinine, Ser: 0.64 mg/dL (ref 0.40–1.20)
GFR: 83.52 mL/min (ref >60.00)
Glucose, Bld: 100 mg/dL — ABNORMAL HIGH (ref 70–99)
Potassium: 3.3 meq/L — ABNORMAL LOW (ref 3.5–5.1)
Sodium: 136 meq/L (ref 135–145)

## 2024-05-25 ENCOUNTER — Ambulatory Visit: Payer: Self-pay | Admitting: Internal Medicine

## 2024-05-25 DIAGNOSIS — E876 Hypokalemia: Secondary | ICD-10-CM

## 2024-05-25 MED ORDER — POTASSIUM CHLORIDE CRYS ER 20 MEQ PO TBCR: 20.0000 meq | EXTENDED_RELEASE_TABLET | Freq: Two times a day (BID) | ORAL | 0 refills | Status: DC

## 2024-07-25 ENCOUNTER — Emergency Department (HOSPITAL_BASED_OUTPATIENT_CLINIC_OR_DEPARTMENT_OTHER)

## 2024-07-25 ENCOUNTER — Ambulatory Visit: Payer: Self-pay | Admitting: Internal Medicine

## 2024-07-25 ENCOUNTER — Other Ambulatory Visit: Payer: Self-pay

## 2024-07-25 ENCOUNTER — Emergency Department (HOSPITAL_BASED_OUTPATIENT_CLINIC_OR_DEPARTMENT_OTHER)
Admission: EM | Admit: 2024-07-25 | Discharge: 2024-07-25 | Disposition: A | Discharge status: Home / Self Care | Source: Home / Self Care | Attending: Emergency Medicine | Admitting: Emergency Medicine

## 2024-07-25 ENCOUNTER — Encounter (HOSPITAL_BASED_OUTPATIENT_CLINIC_OR_DEPARTMENT_OTHER): Payer: Self-pay | Admitting: Emergency Medicine

## 2024-07-25 DIAGNOSIS — R55 Syncope and collapse: Secondary | ICD-10-CM | POA: Diagnosis not present

## 2024-07-25 DIAGNOSIS — S42202A Unspecified fracture of upper end of left humerus, initial encounter for closed fracture: Secondary | ICD-10-CM | POA: Insufficient documentation

## 2024-07-25 DIAGNOSIS — Y9301 Activity, walking, marching and hiking: Secondary | ICD-10-CM | POA: Insufficient documentation

## 2024-07-25 DIAGNOSIS — S42332A Displaced oblique fracture of shaft of humerus, left arm, initial encounter for closed fracture: Secondary | ICD-10-CM | POA: Diagnosis not present

## 2024-07-25 DIAGNOSIS — G20C Parkinsonism, unspecified: Secondary | ICD-10-CM | POA: Insufficient documentation

## 2024-07-25 DIAGNOSIS — W19XXXA Unspecified fall, initial encounter: Secondary | ICD-10-CM | POA: Insufficient documentation

## 2024-07-25 DIAGNOSIS — Y92000 Kitchen of unspecified non-institutional (private) residence as  the place of occurrence of the external cause: Secondary | ICD-10-CM | POA: Insufficient documentation

## 2024-07-25 DIAGNOSIS — M19012 Primary osteoarthritis, left shoulder: Secondary | ICD-10-CM | POA: Diagnosis not present

## 2024-07-25 DIAGNOSIS — S42292G Other displaced fracture of upper end of left humerus, subsequent encounter for fracture with delayed healing: Secondary | ICD-10-CM | POA: Diagnosis not present

## 2024-07-25 DIAGNOSIS — M25519 Pain in unspecified shoulder: Secondary | ICD-10-CM | POA: Diagnosis not present

## 2024-07-25 DIAGNOSIS — S42352A Displaced comminuted fracture of shaft of humerus, left arm, initial encounter for closed fracture: Secondary | ICD-10-CM | POA: Diagnosis not present

## 2024-07-25 MED ORDER — DOCUSATE SODIUM 100 MG PO CAPS: 100.0000 mg | ORAL_CAPSULE | Freq: Two times a day (BID) | ORAL | 0 refills | Status: DC

## 2024-07-25 MED ORDER — POLYETHYLENE GLYCOL 3350 17 GM/SCOOP PO POWD: ORAL | 0 refills | Status: AC

## 2024-07-25 MED ORDER — OXYCODONE HCL 5 MG PO TABS: 2.5000 mg | ORAL_TABLET | ORAL | 0 refills | Status: DC | PRN

## 2024-07-25 NOTE — Telephone Encounter (Signed)
  FYI Only or Action Required?: FYI only for provider.  Patient was last seen in primary care on 04/28/2024 by Theophilus Andrews, Tully GRADE, MD.  Called Nurse Triage reporting Fall.  Symptoms began today.  Triage Disposition: Go to ED Now (Notify PCP)  Patient/caregiver understands and will follow disposition?: Yes           Copied from CRM [id number]. Topic: Clinical - Red Word Triage >> Jul 25, 2024  3:28 PM Suzen RAMAN wrote: Red Word that prompted transfer to Nurse Triage: [name] calling to report a FALL, possible broken hip. Reason for Disposition  Injury (or injuries) that need emergency care  Answer Assessment - Initial Assessment Questions This RN spoke with pt's med tech at Assisted Living Facility. The EMS is on the way to take pt to hospital.  Pt fell when walking to kitchen today- happened 20 mins ago Dislocated shoulder on left side; in a lot of pain Denies losing consciousness, hitting head  Protocols used: Falls and [facility]

## 2024-07-25 NOTE — Discharge Instructions (Signed)
 General instructions Do not use any products that contain nicotine or tobacco. These products include cigarettes, chewing tobacco, and vaping devices, such as e-cigarettes. These can delay bone healing. If you need help quitting, ask your health care provider. Take over-the-counter and prescription medicines only as told by your health care provider. Ask your health care provider if the medicine prescribed to you: Can cause constipation. You may need to take these actions to prevent or treat constipation: Drink enough fluid to keep your urine pale yellow. Take over-the-counter or prescription medicines. Eat foods that are high in fiber, such as beans, whole grains, and fresh fruits and vegetables. Limit foods that are high in fat and processed sugars, such as fried or sweet foods. Keep all follow-up visits. This is important. Contact a health care provider if: You have any new pain, swelling, or bruising. Your pain, swelling, and bruising do not improve. Your cast, splint, or sling becomes loose or damaged. Get help right away if: Your skin or fingers on your injured arm turn blue or gray. Your arm feels cold or numb. You have severe pain in your injured arm.

## 2024-07-25 NOTE — ED Provider Notes (Signed)
 [facility] AT [facility] Provider Note   CSN: [id number] Arrival date & time: 07/25/24  1622     Patient presents with: No chief complaint on file.   [facility] is a 78 y.o. female who arrives with complaint of fall and left shoulder deformity.  History is given at bedside by EMS.  Patient lives in assisted living.  She is not on any blood thinners.  She has a hit history of Parkinson's disorder.  Patient fell onto her left side.  She was able to get herself off the floor get into a chair and called her daughter.  EMS report an obvious left shoulder deformity with concern for possible shoulder dislocation.  Patient denies any numbness or tingling.  She was given fentanyl prior to arrival.   HPI     Prior to Admission medications   Medication Sig Start Date End Date Taking? Authorizing Provider  carbidopa -levodopa  (SINEMET  IR) 25-100 MG tablet Take 3 tablets by mouth daily.    [provider]  Melatonin 10 MG TABS Take 5-10 mg by mouth at bedtime.    [provider]  mirtazapine  (REMERON ) 15 MG tablet Take 15 mg by mouth at bedtime.    [provider]  OVER THE COUNTER MEDICATION as needed. PURZ for sleeping    [provider]  potassium chloride  SA (KLOR-CON  M) 20 MEQ tablet Take 1 tablet (20 mEq total) by mouth 2 (two) times daily for 5 days. 05/25/24 05/30/24  Theophilus Andrews, Tully GRADE, MD  rOPINIRole  (REQUIP ) 4 MG tablet Take 1 mg by mouth daily.    [provider]  rosuvastatin  (CRESTOR ) 5 MG tablet Take 1 tablet (5 mg total) by mouth daily. 05/02/24   Theophilus Andrews, Tully GRADE, MD    Allergies: Crestor  [rosuvastatin  calcium ], Cymbalta  [duloxetine  hcl], Effexor  xr [venlafaxine  hcl er], Morphine and codeine, Pravastatin , Promethazine-codeine, and Rizatriptan     Review of Systems  Updated Vital Signs There were no vitals taken for this visit.  Physical Exam Vitals and nursing note reviewed.   Constitutional:      General: She is not in acute distress.    Appearance: She is well-developed. She is not diaphoretic.  HENT:     Head: Normocephalic and atraumatic.     Right Ear: External ear normal.     Left Ear: External ear normal.     Nose: Nose normal.     Mouth/Throat:     Mouth: Mucous membranes are moist.  Eyes:     General: No scleral icterus.    Conjunctiva/sclera: Conjunctivae normal.  Cardiovascular:     Rate and Rhythm: Normal rate and regular rhythm.     Heart sounds: Normal heart sounds. No murmur heard.    No friction rub. No gallop.  Pulmonary:     Effort: Pulmonary effort is normal. No respiratory distress.     Breath sounds: Normal breath sounds.  Abdominal:     General: Bowel sounds are normal. There is no distension.     Palpations: Abdomen is soft. There is no mass.     Tenderness: There is no abdominal tenderness. There is no guarding.  Musculoskeletal:     Cervical back: Normal range of motion.     Comments: Left shoulder with obvious deformity.  Large hematoma taking up the majority of the deltoid.  Normal ipsilateral elbow wrist and fingers.  Normal motor function of the fingers, normal grip strength, radial pulse 2+.  Skin:    General: Skin  is warm and dry.  Neurological:     Mental Status: She is alert and oriented to person, place, and time.  Psychiatric:        Behavior: Behavior normal.     (all labs ordered are listed, but only abnormal results are displayed) Labs Reviewed - No data to display  EKG: None  Radiology: No results found.   Procedures   Medications Ordered in the ED - No data to display                                  Medical Decision Making Amount and/or Complexity of Data Reviewed Radiology: ordered.  Risk OTC drugs. Prescription drug management.   78 year old female with history of Parkinson's who fell.  She fell directly onto her left shoulder.  I ordered visualized and interpreted left shoulder  x-ray which shows acute proximal humeral fracture.  She has no other acute findings.  Case discussed with the patient and her daughters at bedside.  Patient is comfortable with pain medications for pain control, sling immobilizer immobilizer and close outpatient follow-up with orthopedics.  Discussed bowel regimen, strict return precautions, fall precautions.  No other acute injuries or head injuries.     Final diagnoses:  None    ED Discharge Orders     None          Arloa Chroman, PA-C 07/30/24 2022    Doretha Folks, MD 07/31/24 1055

## 2024-07-25 NOTE — ED Notes (Signed)
 Reviewed AVS/discharge instruction with patient. Time allotted for and all questions answered. Patient is agreeable for d/c and escorted to ed exit by staff.

## 2024-07-25 NOTE — ED Triage Notes (Signed)
 From [facility] at [facility] while walking to kitchen  Fall onto left side Deformity and hematoma to left shoulder  20G IV in r fa 50 MCG fentyl from EMS Ems VS 136/70 98 100%

## 2024-07-26 DIAGNOSIS — S42292A Other displaced fracture of upper end of left humerus, initial encounter for closed fracture: Secondary | ICD-10-CM | POA: Diagnosis not present

## 2024-07-27 ENCOUNTER — Emergency Department (HOSPITAL_BASED_OUTPATIENT_CLINIC_OR_DEPARTMENT_OTHER)

## 2024-07-27 ENCOUNTER — Ambulatory Visit: Admitting: Orthopedic Surgery

## 2024-07-27 ENCOUNTER — Encounter (HOSPITAL_BASED_OUTPATIENT_CLINIC_OR_DEPARTMENT_OTHER): Payer: Self-pay | Admitting: *Deleted

## 2024-07-27 ENCOUNTER — Other Ambulatory Visit: Payer: Self-pay

## 2024-07-27 ENCOUNTER — Inpatient Hospital Stay (HOSPITAL_BASED_OUTPATIENT_CLINIC_OR_DEPARTMENT_OTHER)
Admission: EM | Admit: 2024-07-27 | Discharge: 2024-08-05 | DRG: 493 | Disposition: A | Discharge status: Home / Self Care | Source: Skilled Nursing Facility | Attending: Internal Medicine | Admitting: Internal Medicine

## 2024-07-27 DIAGNOSIS — M25561 Pain in right knee: Secondary | ICD-10-CM | POA: Diagnosis not present

## 2024-07-27 DIAGNOSIS — R55 Syncope and collapse: Principal | ICD-10-CM | POA: Diagnosis present

## 2024-07-27 DIAGNOSIS — D649 Anemia, unspecified: Secondary | ICD-10-CM | POA: Insufficient documentation

## 2024-07-27 DIAGNOSIS — G2581 Restless legs syndrome: Secondary | ICD-10-CM | POA: Diagnosis present

## 2024-07-27 DIAGNOSIS — Z885 Allergy status to narcotic agent status: Secondary | ICD-10-CM

## 2024-07-27 DIAGNOSIS — Z79899 Other long term (current) drug therapy: Secondary | ICD-10-CM

## 2024-07-27 DIAGNOSIS — R519 Headache, unspecified: Secondary | ICD-10-CM | POA: Diagnosis not present

## 2024-07-27 DIAGNOSIS — W19XXXA Unspecified fall, initial encounter: Secondary | ICD-10-CM | POA: Diagnosis not present

## 2024-07-27 DIAGNOSIS — Z9841 Cataract extraction status, right eye: Secondary | ICD-10-CM

## 2024-07-27 DIAGNOSIS — M5030 Other cervical disc degeneration, unspecified cervical region: Secondary | ICD-10-CM | POA: Diagnosis present

## 2024-07-27 DIAGNOSIS — Z9181 History of falling: Secondary | ICD-10-CM

## 2024-07-27 DIAGNOSIS — M542 Cervicalgia: Secondary | ICD-10-CM | POA: Diagnosis not present

## 2024-07-27 DIAGNOSIS — S42292G Other displaced fracture of upper end of left humerus, subsequent encounter for fracture with delayed healing: Principal | ICD-10-CM

## 2024-07-27 DIAGNOSIS — S0101XA Laceration without foreign body of scalp, initial encounter: Secondary | ICD-10-CM | POA: Diagnosis present

## 2024-07-27 DIAGNOSIS — Z83719 Family history of colon polyps, unspecified: Secondary | ICD-10-CM

## 2024-07-27 DIAGNOSIS — E876 Hypokalemia: Secondary | ICD-10-CM | POA: Diagnosis present

## 2024-07-27 DIAGNOSIS — R531 Weakness: Secondary | ICD-10-CM | POA: Diagnosis present

## 2024-07-27 DIAGNOSIS — S42292D Other displaced fracture of upper end of left humerus, subsequent encounter for fracture with routine healing: Secondary | ICD-10-CM

## 2024-07-27 DIAGNOSIS — F32A Depression, unspecified: Secondary | ICD-10-CM | POA: Diagnosis present

## 2024-07-27 DIAGNOSIS — F0284 Dementia in other diseases classified elsewhere, unspecified severity, with anxiety: Secondary | ICD-10-CM | POA: Diagnosis present

## 2024-07-27 DIAGNOSIS — M797 Fibromyalgia: Secondary | ICD-10-CM | POA: Diagnosis present

## 2024-07-27 DIAGNOSIS — Z800 Family history of malignant neoplasm of digestive organs: Secondary | ICD-10-CM

## 2024-07-27 DIAGNOSIS — S42295A Other nondisplaced fracture of upper end of left humerus, initial encounter for closed fracture: Secondary | ICD-10-CM | POA: Diagnosis not present

## 2024-07-27 DIAGNOSIS — R000 Tachycardia, unspecified: Secondary | ICD-10-CM | POA: Diagnosis not present

## 2024-07-27 DIAGNOSIS — G20A1 Parkinson's disease without dyskinesia, without mention of fluctuations: Secondary | ICD-10-CM | POA: Diagnosis present

## 2024-07-27 DIAGNOSIS — E7800 Pure hypercholesterolemia, unspecified: Secondary | ICD-10-CM | POA: Diagnosis present

## 2024-07-27 DIAGNOSIS — Z4802 Encounter for removal of sutures: Secondary | ICD-10-CM

## 2024-07-27 DIAGNOSIS — Z66 Do not resuscitate: Secondary | ICD-10-CM | POA: Diagnosis not present

## 2024-07-27 DIAGNOSIS — Z87891 Personal history of nicotine dependence: Secondary | ICD-10-CM

## 2024-07-27 DIAGNOSIS — Z8249 Family history of ischemic heart disease and other diseases of the circulatory system: Secondary | ICD-10-CM

## 2024-07-27 DIAGNOSIS — M51369 Other intervertebral disc degeneration, lumbar region without mention of lumbar back pain or lower extremity pain: Secondary | ICD-10-CM | POA: Diagnosis present

## 2024-07-27 DIAGNOSIS — Y92099 Unspecified place in other non-institutional residence as the place of occurrence of the external cause: Secondary | ICD-10-CM

## 2024-07-27 DIAGNOSIS — S42202A Unspecified fracture of upper end of left humerus, initial encounter for closed fracture: Secondary | ICD-10-CM | POA: Diagnosis not present

## 2024-07-27 DIAGNOSIS — E559 Vitamin D deficiency, unspecified: Secondary | ICD-10-CM | POA: Diagnosis present

## 2024-07-27 DIAGNOSIS — R296 Repeated falls: Secondary | ICD-10-CM | POA: Diagnosis present

## 2024-07-27 DIAGNOSIS — M8588 Other specified disorders of bone density and structure, other site: Secondary | ICD-10-CM | POA: Diagnosis present

## 2024-07-27 DIAGNOSIS — Z860101 Personal history of adenomatous and serrated colon polyps: Secondary | ICD-10-CM

## 2024-07-27 DIAGNOSIS — Z888 Allergy status to other drugs, medicaments and biological substances status: Secondary | ICD-10-CM

## 2024-07-27 DIAGNOSIS — Z90710 Acquired absence of both cervix and uterus: Secondary | ICD-10-CM

## 2024-07-27 DIAGNOSIS — Z825 Family history of asthma and other chronic lower respiratory diseases: Secondary | ICD-10-CM

## 2024-07-27 DIAGNOSIS — Z820 Family history of epilepsy and other diseases of the nervous system: Secondary | ICD-10-CM

## 2024-07-27 DIAGNOSIS — Z801 Family history of malignant neoplasm of trachea, bronchus and lung: Secondary | ICD-10-CM

## 2024-07-27 DIAGNOSIS — I951 Orthostatic hypotension: Secondary | ICD-10-CM | POA: Diagnosis present

## 2024-07-27 DIAGNOSIS — D638 Anemia in other chronic diseases classified elsewhere: Secondary | ICD-10-CM | POA: Diagnosis present

## 2024-07-27 DIAGNOSIS — I10 Essential (primary) hypertension: Secondary | ICD-10-CM | POA: Diagnosis present

## 2024-07-27 DIAGNOSIS — S42202D Unspecified fracture of upper end of left humerus, subsequent encounter for fracture with routine healing: Secondary | ICD-10-CM

## 2024-07-27 DIAGNOSIS — Z9842 Cataract extraction status, left eye: Secondary | ICD-10-CM

## 2024-07-27 DIAGNOSIS — W1830XD Fall on same level, unspecified, subsequent encounter: Secondary | ICD-10-CM

## 2024-07-27 DIAGNOSIS — S2241XA Multiple fractures of ribs, right side, initial encounter for closed fracture: Secondary | ICD-10-CM | POA: Diagnosis present

## 2024-07-27 DIAGNOSIS — Z043 Encounter for examination and observation following other accident: Secondary | ICD-10-CM | POA: Diagnosis not present

## 2024-07-27 DIAGNOSIS — M75101 Unspecified rotator cuff tear or rupture of right shoulder, not specified as traumatic: Secondary | ICD-10-CM | POA: Diagnosis present

## 2024-07-27 DIAGNOSIS — F0283 Dementia in other diseases classified elsewhere, unspecified severity, with mood disturbance: Secondary | ICD-10-CM | POA: Diagnosis present

## 2024-07-27 LAB — URINALYSIS, ROUTINE W REFLEX MICROSCOPIC
Bacteria, UA: NONE SEEN
Bilirubin Urine: NEGATIVE
Glucose, UA: NEGATIVE mg/dL
Hgb urine dipstick: NEGATIVE
Ketones, ur: 15 mg/dL — AB
Nitrite: NEGATIVE
Protein, ur: NEGATIVE mg/dL
Specific Gravity, Urine: 1.016 (ref 1.005–1.030)
pH: 6 (ref 5.0–8.0)

## 2024-07-27 LAB — COMPREHENSIVE METABOLIC PANEL WITH GFR
ALT: <5 U/L (ref 0–44)
AST: 26 U/L (ref 15–41)
Albumin: 3.8 g/dL (ref 3.5–5.0)
Alkaline Phosphatase: 77 U/L (ref 38–126)
Anion gap: 14 (ref 5–15)
BUN: 8 mg/dL (ref 8–23)
CO2: 23 mmol/L (ref 22–32)
Calcium: 9 mg/dL (ref 8.9–10.3)
Chloride: 97 mmol/L — ABNORMAL LOW (ref 98–111)
Creatinine, Ser: 0.51 mg/dL (ref 0.44–1.00)
GFR, Estimated: >60 mL/min (ref >60)
Glucose, Bld: 105 mg/dL — ABNORMAL HIGH (ref 70–99)
Potassium: 3.2 mmol/L — ABNORMAL LOW (ref 3.5–5.1)
Sodium: 133 mmol/L — ABNORMAL LOW (ref 135–145)
Total Bilirubin: 0.5 mg/dL (ref 0.0–1.2)
Total Protein: 5.5 g/dL — ABNORMAL LOW (ref 6.5–8.1)

## 2024-07-27 LAB — CBC
HCT: 24.9 % — ABNORMAL LOW (ref 36.0–46.0)
Hemoglobin: 8.6 g/dL — ABNORMAL LOW (ref 12.0–15.0)
MCH: 32.6 pg (ref 26.0–34.0)
MCHC: 34.5 g/dL (ref 30.0–36.0)
MCV: 94.3 fL (ref 80.0–100.0)
Platelets: 183 10*3/uL (ref 150–400)
RBC: 2.64 MIL/uL — ABNORMAL LOW (ref 3.87–5.11)
RDW: 13.3 % (ref 11.5–15.5)
WBC: 6.6 10*3/uL (ref 4.0–10.5)
nRBC: 0 % (ref 0.0–0.2)

## 2024-07-27 LAB — TROPONIN T, HIGH SENSITIVITY
Troponin T High Sensitivity: <15 ng/L (ref 0–19)
Troponin T High Sensitivity: <15 ng/L (ref 0–19)

## 2024-07-27 MED ORDER — LORAZEPAM 1 MG PO TABS
0.5000 mg | ORAL_TABLET | Freq: Once | ORAL | Status: AC
  Administered 2024-07-27: 0.5 mg via ORAL
  Filled 2024-07-27: qty 1

## 2024-07-27 MED ORDER — LIDOCAINE-EPINEPHRINE (PF) 2 %-1:200000 IJ SOLN
10.0000 mL | Freq: Once | INTRAMUSCULAR | Status: AC
  Administered 2024-07-27: 10 mL
  Filled 2024-07-27: qty 20

## 2024-07-27 MED ORDER — FENTANYL CITRATE (PF) 50 MCG/ML IJ SOSY
50.0000 ug | PREFILLED_SYRINGE | Freq: Once | INTRAMUSCULAR | Status: AC
  Administered 2024-07-27: 50 ug via INTRAVENOUS
  Filled 2024-07-27: qty 1

## 2024-07-27 NOTE — ED Notes (Signed)
 RN called receiving unit and confirmed Receiving RN had no further questions at this time.

## 2024-07-27 NOTE — ED Provider Notes (Signed)
   Procedures  .Laceration Repair  Date/Time: 07/27/2024 7:54 PM  Performed by: Myriam Fonda RAMAN, PA-C Authorized by: Myriam Fonda RAMAN, PA-C   Consent:    Consent obtained:  Verbal and written   Consent given by:  Patient   Risks, benefits, and alternatives were discussed: yes     Risks discussed:  Infection, need for additional repair, nerve damage, poor wound healing, poor cosmetic result and pain   Alternatives discussed:  No treatment and delayed treatment Universal protocol:    Procedure explained and questions answered to patient or proxy's satisfaction: yes     Relevant documents present and verified: yes     Test results available: yes     Imaging studies available: yes     Site/side marked: yes     Immediately prior to procedure, a time out was called: yes     Patient identity confirmed:  Verbally with patient Anesthesia:    Anesthesia method:  Local infiltration   Local anesthetic:  Lidocaine  2% WITH epi Laceration details:    Location:  Scalp   Scalp location:  Occipital   Length (cm):  3   Depth (mm):  2 Pre-procedure details:    Preparation:  Patient was prepped and draped in usual sterile fashion and imaging obtained to evaluate for foreign bodies Exploration:    Hemostasis achieved with:  Direct pressure and epinephrine   Imaging outcome: foreign body not noted     Wound exploration: entire depth of wound visualized     Wound extent: no foreign body   Treatment:    Area cleansed with:  Saline   Amount of cleaning:  Standard   Irrigation solution:  Sterile saline   Irrigation volume:    Irrigation method:  Pressure wash Skin repair:    Repair method:  Staples   Number of staples:  9 Approximation:    Approximation:  Close Repair type:    Repair type:  Simple Post-procedure details:    Dressing:  Open (no dressing)   Procedure completion:  [place] Myriam Fonda RAMAN, PA-C 07/27/24 1956    Randol Simmonds, MD 07/28/24 1134

## 2024-07-27 NOTE — ED Notes (Signed)
 Purple man Green.

## 2024-07-27 NOTE — ED Provider Notes (Addendum)
 [place] EMERGENCY DEPARTMENT AT [facility] Provider Note   CSN: [id number] Arrival date & time: 07/27/24  1723     Patient presents with: Fall and Weakness   [facility] is a 78 y.o. female.   [place].   Fall  Weakness    Patient has a history of diverticulosis depression fibromyalgia headaches hypercholesterolemia hypertension, Parkinson's.  Patient states she was walking today when she suddenly felt drained and weak and then fell.  Patient hit her head sustaining laceration of the back of her head.  Patient states she is not having pain in the back of her head also in her shoulder.  Patient was recently seen in the emergency room on October 13 after fall.  She was found to have a fracture at the proximal end of her left humerus.  Patient actually had outpatient follow-up with the orthopedic doctor today before her fall  Prior to Admission medications   Medication Sig Start Date End Date Taking? Authorizing Provider  carbidopa -levodopa  (SINEMET  IR) 25-100 MG tablet Take 3 tablets by mouth daily.    [provider]  docusate sodium  (COLACE) 100 MG capsule Take 1 capsule (100 mg total) by mouth every 12 (twelve) hours. 07/25/24   Harris, Abigail, PA-C  Melatonin 10 MG TABS Take 5-10 mg by mouth at bedtime.    [provider]  mirtazapine  (REMERON ) 15 MG tablet Take 15 mg by mouth at bedtime.    [provider]  OVER THE COUNTER MEDICATION as needed. PURZ for sleeping    [provider]  oxyCODONE (ROXICODONE) 5 MG immediate release tablet Take 0.5-1 tablets (2.5-5 mg total) by mouth every 4 (four) hours as needed for severe pain (pain score 7-10). 07/25/24   Harris, Abigail, PA-C  polyethylene glycol powder (GLYCOLAX /MIRALAX ) 17 GM/SCOOP powder 1-3 capfulls in 8oz water daily as needed for constipation 07/25/24   Harris, Abigail, PA-C  potassium chloride  SA (KLOR-CON  M) 20 MEQ tablet Take 1 tablet (20 mEq total) by mouth 2 (two) times  daily for 5 days. 05/25/24 05/30/24  Theophilus Andrews, Tully GRADE, MD  rOPINIRole  (REQUIP ) 4 MG tablet Take 1 mg by mouth daily.    [provider]  rosuvastatin  (CRESTOR ) 5 MG tablet Take 1 tablet (5 mg total) by mouth daily. 05/02/24   Theophilus Andrews, Tully GRADE, MD    Allergies: Crestor  [rosuvastatin  calcium ], Cymbalta  [duloxetine  hcl], Effexor  xr [venlafaxine  hcl er], Morphine and codeine, Pravastatin , Promethazine-codeine, and Rizatriptan     Review of Systems  Neurological:  Positive for weakness.    Updated Vital Signs BP (!) 163/82 (BP Location: Right Arm)   Pulse (!) 106   Temp 98.7 F (37.1 C) (Oral)   Resp 20   SpO2 100%   Physical Exam Vitals and nursing note reviewed.  Constitutional:      Appearance: She is well-developed. She is not diaphoretic.  HENT:     Head: Normocephalic.     Comments: Laceration posterior occiput    Right Ear: External ear normal.     Left Ear: External ear normal.  Eyes:     General: No scleral icterus.       Right eye: No discharge.        Left eye: No discharge.     Conjunctiva/sclera: Conjunctivae normal.  Neck:     Trachea: No tracheal deviation.  Cardiovascular:     Rate and Rhythm: Normal rate and regular rhythm.  Pulmonary:     Effort: Pulmonary effort is normal. No respiratory  distress.     Breath sounds: Normal breath sounds. No stridor. No wheezing or rales.  Abdominal:     General: Bowel sounds are normal. There is no distension.     Palpations: Abdomen is soft.     Tenderness: There is no abdominal tenderness. There is no guarding or rebound.  Musculoskeletal:        General: No deformity.     Left upper arm: Swelling and tenderness present.     Cervical back: Neck supple. Tenderness present.     Thoracic back: No tenderness.     Lumbar back: Tenderness present.     Comments: Left upper arm bruised swollen, sling in place  Skin:    General: Skin is warm and dry.     Findings: No rash.  Neurological:      General: No focal deficit present.     Mental Status: She is alert.     Cranial Nerves: No cranial nerve deficit, dysarthria or facial asymmetry.     Sensory: No sensory deficit.     Motor: No abnormal muscle tone or seizure activity.     Coordination: Coordination normal.  Psychiatric:        Mood and Affect: Mood normal.     (all labs ordered are listed, but only abnormal results are displayed) Labs Reviewed  COMPREHENSIVE METABOLIC PANEL WITH GFR - Abnormal; Notable for the following components:      Result Value   Sodium 133 (*)    Potassium 3.2 (*)    Chloride 97 (*)    Glucose, Bld 105 (*)    Total Protein 5.5 (*)    All other components within normal limits  CBC - Abnormal; Notable for the following components:   RBC 2.64 (*)    Hemoglobin 8.6 (*)    HCT 24.9 (*)    All other components within normal limits  URINALYSIS, ROUTINE W REFLEX MICROSCOPIC - Abnormal; Notable for the following components:   Ketones, ur 15 (*)    Leukocytes,Ua LARGE (*)    All other components within normal limits  TROPONIN T, HIGH SENSITIVITY  TROPONIN T, HIGH SENSITIVITY    EKG: EKG Interpretation Date/Time:  Wednesday July 27 2024 17:27:31 EDT Ventricular Rate:  84 PR Interval:  190 QRS Duration:  96 QT Interval:  402 QTC Calculation: 476 R Axis:   33  Text Interpretation: Sinus rhythm Confirmed by Randol Simmonds [phone]) on 07/27/2024 5:30:47 PM  Radiology: [facility] Lumbar Spine 2-3 Views Result Date: 07/27/2024 EXAM: 2 or 3 VIEW(S) XRAY OF THE LUMBAR SPINE 07/27/2024 06:14:00 PM COMPARISON: None available. CLINICAL HISTORY: fall, pain. Patient to ED via EMS from [place] at [place]. Pt reported weakness while walking to the bathroom and fell hitting head on door jam. Laceration to the back of patient's head, bleeding under control. Neck, head and right knee pain reported. No blood ; thinners. FINDINGS: LIMITATIONS: Examination is limited secondary to technique. LUMBAR SPINE: BONES: The  bones are osteopenic. No acute fracture. No aggressive appearing osseous lesion. Alignment is normal. DISCS AND DEGENERATIVE CHANGES: There is mild disc space narrowing at L4-L5 and L5-S1. No severe degenerative changes. SOFT TISSUES: No acute abnormality. IMPRESSION: 1. No acute osseous abnormality of the lumbar spine. 2. Mild degenerative change  at L4-L5 and L5-S1. 3. Osteopenia. Electronically signed by: Greig Pique MD 07/27/2024 06:28 PM EDT RP Workstation: [id number]   DG Knee 2 Views Right Result Date: 07/27/2024 EXAM: 2 VIEW(S) XRAY OF THE RIGHT KNEE 07/27/2024 06:14:00 PM COMPARISON:  None available. CLINICAL HISTORY: fall. Patient to ED via EMS from [place] at [place]. No blood ; thinners. FINDINGS: BONES AND JOINTS: No acute fracture. No focal osseous lesion. No joint dislocation. No significant joint effusion. There is mild degenerative narrowing of the patellofemoral joint space. SOFT TISSUES: The soft tissues are unremarkable. IMPRESSION: 1. No acute osseous abnormality. 2. Patellofemoral osteoarthrosis, mild. Electronically signed by: Greig Pique MD 07/27/2024 06:27 PM EDT RP Workstation: [id number]   DG Shoulder Left Result Date: 07/27/2024 EXAM: 1 VIEW XRAY OF THE LEFT SHOULDER 07/27/2024 06:14:00 PM COMPARISON: None available. CLINICAL HISTORY: fall. Patient to ED via EMS from [place] at [place]. Pt reported weakness while walking to the bathroom and fell hitting head on door jam. Laceration to the back of patient's head, bleeding under control. Neck, head and right knee pain reported. No blood thinners. FINDINGS: BONES AND JOINTS: Glenohumeral joint is normally aligned. There is an acute transverse fracture through the proximal diaphysis of the left humerus. There is 1 shaft with anterior displacement of the distal fracture fragment with 4 cm of overlap. There is no dislocation. The [facility] joint is unremarkable in appearance. SOFT TISSUES: There is overlying soft tissue swelling. No  abnormal calcifications. Visualized lung is unremarkable. IMPRESSION: 1. Displaced Acute transverse fracture through the proximal diaphysis of the left humerus 2. No dislocation Electronically signed by: Greig Pique MD 07/27/2024 06:25 PM EDT RP Workstation: [id number]   DG Chest Portable 1 View Result Date: 07/27/2024 EXAM: 1 VIEW(S) XRAY OF THE CHEST 07/27/2024 06:14:00 PM COMPARISON: Chest x-ray 09/17/2022. CLINICAL HISTORY: fall. Patient to ED via EMS from [place] at [place]. Pt reported weakness while walking to the bathroom and fell hitting head on door jam. Laceration to the back of patient's head, bleeding under control. Neck, head and right knee pain reported. No blood ; thinners. FINDINGS: LUNGS AND PLEURA: Stable calcified granuloma in the right lower lobe. No pulmonary edema. No pleural effusion. No pneumothorax. HEART AND MEDIASTINUM: No acute abnormality of the cardiac and mediastinal silhouettes. BONES AND SOFT TISSUES: Proximal left humeral fracture is partially imaged. IMPRESSION: 1. No acute cardiopulmonary process. 2. Partially imaged proximal left humeral fracture. Electronically signed by: Greig Pique MD 07/27/2024 06:23 PM EDT RP Workstation: [id number]   CT Cervical Spine Wo Contrast Result Date: 07/27/2024 CLINICAL DATA:  Status post fall. EXAM: CT CERVICAL SPINE WITHOUT CONTRAST TECHNIQUE: Multidetector CT imaging of the cervical spine was performed without intravenous contrast. Multiplanar CT image reconstructions were also generated. RADIATION DOSE REDUCTION: This exam was performed according to the departmental dose-optimization program which includes automated exposure control, adjustment of the mA and/or kV according to patient size and/or use of iterative reconstruction technique. COMPARISON:  None Available. FINDINGS: Alignment: There is approximately 1 mm anterolisthesis of the C2 vertebral body on C3, with 1 mm to 2 mm retrolisthesis of the C5 vertebral body on C6.  Skull base and vertebrae: No acute fracture. No primary bone lesion or focal pathologic process. Soft tissues and spinal canal: No prevertebral fluid or swelling. No visible canal hematoma. Disc levels: Marked severity endplate sclerosis anterior osteophyte formation and posterior bony spurring are seen at the levels of C5-C6 and C6-C7. Mild to moderate severity similar appearing findings are seen at the level of C3-C4. There is marked severity narrowing of the anterior atlantoaxial articulation with marked severity intervertebral disc space narrowing at the levels of C5-C6 and C6-C7. Bilateral marked severity multilevel facet joint hypertrophy is noted. Upper chest: Negative. Other: Heterogeneous low-attenuation thyroid  nodules are  seen within the left lobe of the thyroid  gland. IMPRESSION: 1. No acute fracture or traumatic subluxation. 2. Marked severity multilevel degenerative changes, most prominent at the levels of C5-C6 and C6-C7. 3. Low-attenuation thyroid  nodules, as described above. Correlation with nonemergent thyroid  ultrasound is recommended. Electronically Signed   By: Suzen Dials M.D.   On: 07/27/2024 18:16   CT Head Wo Contrast Result Date: 07/27/2024 CLINICAL DATA:  Status post fall. EXAM: CT HEAD WITHOUT CONTRAST TECHNIQUE: Contiguous axial images were obtained from the base of the skull through the vertex without intravenous contrast. RADIATION DOSE REDUCTION: This exam was performed according to the departmental dose-optimization program which includes automated exposure control, adjustment of the mA and/or kV according to patient size and/or use of iterative reconstruction technique. COMPARISON:  May 17, 2023 FINDINGS: Brain: There is generalized cerebral atrophy with widening of the extra-axial spaces and ventricular dilatation. There are areas of decreased attenuation within the white matter tracts of the supratentorial brain, consistent with microvascular disease changes. Vascular:  Moderate to marked severity bilateral cavernous carotid artery calcification is noted. Skull: Normal. Negative for fracture or focal lesion. Sinuses/Orbits: No acute finding. Other: Mild left occipital scalp soft tissue swelling is noted. IMPRESSION: 1. Generalized cerebral atrophy and microvascular disease changes of the supratentorial brain. 2. Mild left occipital scalp soft tissue swelling without evidence for an acute fracture or acute intracranial abnormality. Electronically Signed   By: Suzen Dials M.D.   On: 07/27/2024 18:13     Procedures   Medications Ordered in the ED  lidocaine -EPINEPHrine (XYLOCAINE  W/EPI) 2 %-1:200000 (PF) injection 10 mL (has no administration in time range)  fentaNYL (SUBLIMAZE) injection 50 mcg (50 mcg Intravenous Given 07/27/24 1835)    Clinical Course as of 07/27/24 1929  Wed Jul 27, 2024  1813 CBC(!) Hemoglobin decreased compared to previous [JK]  1840 X-rays do not show acute injury other than in the shoulder.  Does appear more displaced since the previous film [JK]  1840 Comprehensive metabolic panel(!) No significant metabolic abnormalities noted other than sodium and potassium slightly decreased [JK]  1929 Case discussed with Dr. Shona regarding admission [JK]    Clinical Course User Index [JK] Randol Simmonds, MD                                 Medical Decision Making Problems Addressed: Anemia, unspecified type: acute illness or injury Near syncope: acute illness or injury Other closed displaced fracture of proximal end of left humerus with routine healing, subsequent encounter: acute illness or injury  Amount and/or Complexity of Data Reviewed Labs: ordered. Decision-making details documented in ED Course. Radiology: ordered and independent interpretation performed.  Risk Prescription drug management. Decision regarding hospitalization.   Patient presented to the ED for evaluation after near syncopal episode.  Patient was recently in  the emergency room for fall where she was diagnosed with a proximal humerus fracture.  Patient fell again today.  Patient describes feeling weak before she fell.   Patient does have significant bruising and swelling in her left upper arm where she previously fractured her humerus.  X-rays do show more displacement today after her fall.  Patient did see Dr. Addie in the office earlier today.  Patient's labs are notable for decreasing hemoglobin.  She denies any blood in her stool or any dark stools.  I suspect this was related to the bruising and swelling from her recent fall.  Will  need to monitor her hemoglobin.  This may be contributing to her recent fall.  With her syncopal episode I think she would benefit from continued cardiac monitoring.  Will consult with the hospital service for admission.    Final diagnoses:  Near syncope  Anemia, unspecified type  Other closed displaced fracture of proximal end of left humerus with routine healing, subsequent encounter    ED Discharge Orders     None          Randol Simmonds, MD 07/27/24 1929 Daughter states patient is getting fidgety and requests something for sleep.  P.o. Ativan  ordered   Randol Simmonds, MD 07/27/24 2056

## 2024-07-27 NOTE — ED Triage Notes (Signed)
 Patient to ED via EMS from [place] at [place]. Pt reported weakness while walking to the bathroom and fell hitting head on door jam. Laceration to the back of patient's head, bleeding under control. Neck, head and right knee pain reported. No blood thinners.

## 2024-07-27 NOTE — ED Notes (Signed)
 [place] and suture cart/stapler at bedside... Provider aware.SABRASABRA

## 2024-07-27 NOTE — ED Notes (Addendum)
 Pts family brought Parkinson med from her nursing home... Confirmed med with second [employer] fund[employer]... Pt took med... Provider informed and cleared... Carbidopa  25-100.SABRASABRA

## 2024-07-28 ENCOUNTER — Observation Stay (HOSPITAL_COMMUNITY)

## 2024-07-28 ENCOUNTER — Encounter: Payer: Self-pay | Admitting: Orthopedic Surgery

## 2024-07-28 DIAGNOSIS — F32A Depression, unspecified: Secondary | ICD-10-CM | POA: Diagnosis not present

## 2024-07-28 DIAGNOSIS — G8918 Other acute postprocedural pain: Secondary | ICD-10-CM | POA: Diagnosis not present

## 2024-07-28 DIAGNOSIS — S42292D Other displaced fracture of upper end of left humerus, subsequent encounter for fracture with routine healing: Secondary | ICD-10-CM | POA: Diagnosis not present

## 2024-07-28 DIAGNOSIS — M797 Fibromyalgia: Secondary | ICD-10-CM | POA: Diagnosis not present

## 2024-07-28 DIAGNOSIS — Y92099 Unspecified place in other non-institutional residence as the place of occurrence of the external cause: Secondary | ICD-10-CM | POA: Diagnosis not present

## 2024-07-28 DIAGNOSIS — F0283 Dementia in other diseases classified elsewhere, unspecified severity, with mood disturbance: Secondary | ICD-10-CM | POA: Diagnosis not present

## 2024-07-28 DIAGNOSIS — Z888 Allergy status to other drugs, medicaments and biological substances status: Secondary | ICD-10-CM | POA: Diagnosis not present

## 2024-07-28 DIAGNOSIS — F0284 Dementia in other diseases classified elsewhere, unspecified severity, with anxiety: Secondary | ICD-10-CM | POA: Diagnosis not present

## 2024-07-28 DIAGNOSIS — R296 Repeated falls: Secondary | ICD-10-CM | POA: Diagnosis not present

## 2024-07-28 DIAGNOSIS — Z820 Family history of epilepsy and other diseases of the nervous system: Secondary | ICD-10-CM | POA: Diagnosis not present

## 2024-07-28 DIAGNOSIS — F0280 Dementia in other diseases classified elsewhere without behavioral disturbance: Secondary | ICD-10-CM | POA: Diagnosis not present

## 2024-07-28 DIAGNOSIS — W1830XD Fall on same level, unspecified, subsequent encounter: Secondary | ICD-10-CM | POA: Diagnosis not present

## 2024-07-28 DIAGNOSIS — W19XXXA Unspecified fall, initial encounter: Secondary | ICD-10-CM

## 2024-07-28 DIAGNOSIS — G20A1 Parkinson's disease without dyskinesia, without mention of fluctuations: Secondary | ICD-10-CM | POA: Diagnosis not present

## 2024-07-28 DIAGNOSIS — Z825 Family history of asthma and other chronic lower respiratory diseases: Secondary | ICD-10-CM | POA: Diagnosis not present

## 2024-07-28 DIAGNOSIS — R55 Syncope and collapse: Secondary | ICD-10-CM

## 2024-07-28 DIAGNOSIS — Z801 Family history of malignant neoplasm of trachea, bronchus and lung: Secondary | ICD-10-CM | POA: Diagnosis not present

## 2024-07-28 DIAGNOSIS — E876 Hypokalemia: Secondary | ICD-10-CM

## 2024-07-28 DIAGNOSIS — Z800 Family history of malignant neoplasm of digestive organs: Secondary | ICD-10-CM | POA: Diagnosis not present

## 2024-07-28 DIAGNOSIS — R531 Weakness: Secondary | ICD-10-CM | POA: Diagnosis not present

## 2024-07-28 DIAGNOSIS — I10 Essential (primary) hypertension: Secondary | ICD-10-CM | POA: Diagnosis not present

## 2024-07-28 DIAGNOSIS — S42292G Other displaced fracture of upper end of left humerus, subsequent encounter for fracture with delayed healing: Secondary | ICD-10-CM | POA: Diagnosis not present

## 2024-07-28 DIAGNOSIS — I951 Orthostatic hypotension: Secondary | ICD-10-CM | POA: Diagnosis not present

## 2024-07-28 DIAGNOSIS — E7800 Pure hypercholesterolemia, unspecified: Secondary | ICD-10-CM | POA: Diagnosis not present

## 2024-07-28 DIAGNOSIS — Z87891 Personal history of nicotine dependence: Secondary | ICD-10-CM | POA: Diagnosis not present

## 2024-07-28 DIAGNOSIS — S42202A Unspecified fracture of upper end of left humerus, initial encounter for closed fracture: Secondary | ICD-10-CM | POA: Diagnosis not present

## 2024-07-28 DIAGNOSIS — Z8249 Family history of ischemic heart disease and other diseases of the circulatory system: Secondary | ICD-10-CM | POA: Diagnosis not present

## 2024-07-28 DIAGNOSIS — S42202D Unspecified fracture of upper end of left humerus, subsequent encounter for fracture with routine healing: Secondary | ICD-10-CM

## 2024-07-28 DIAGNOSIS — Z885 Allergy status to narcotic agent status: Secondary | ICD-10-CM | POA: Diagnosis not present

## 2024-07-28 DIAGNOSIS — S42302A Unspecified fracture of shaft of humerus, left arm, initial encounter for closed fracture: Secondary | ICD-10-CM | POA: Diagnosis not present

## 2024-07-28 DIAGNOSIS — S42292A Other displaced fracture of upper end of left humerus, initial encounter for closed fracture: Secondary | ICD-10-CM | POA: Diagnosis not present

## 2024-07-28 DIAGNOSIS — Z66 Do not resuscitate: Secondary | ICD-10-CM | POA: Diagnosis not present

## 2024-07-28 DIAGNOSIS — D649 Anemia, unspecified: Secondary | ICD-10-CM | POA: Diagnosis not present

## 2024-07-28 DIAGNOSIS — E559 Vitamin D deficiency, unspecified: Secondary | ICD-10-CM | POA: Diagnosis present

## 2024-07-28 DIAGNOSIS — S2241XA Multiple fractures of ribs, right side, initial encounter for closed fracture: Secondary | ICD-10-CM | POA: Diagnosis not present

## 2024-07-28 DIAGNOSIS — G2581 Restless legs syndrome: Secondary | ICD-10-CM | POA: Diagnosis not present

## 2024-07-28 DIAGNOSIS — D638 Anemia in other chronic diseases classified elsewhere: Secondary | ICD-10-CM | POA: Diagnosis not present

## 2024-07-28 DIAGNOSIS — S0101XA Laceration without foreign body of scalp, initial encounter: Secondary | ICD-10-CM | POA: Diagnosis not present

## 2024-07-28 LAB — HEMOGLOBIN AND HEMATOCRIT, BLOOD
HCT: 28.3 % — ABNORMAL LOW (ref 36.0–46.0)
Hemoglobin: 9.6 g/dL — ABNORMAL LOW (ref 12.0–15.0)

## 2024-07-28 LAB — ECHOCARDIOGRAM COMPLETE
Area-P 1/2: 4.63 cm2
S' Lateral: 1.3 cm

## 2024-07-28 LAB — BASIC METABOLIC PANEL WITH GFR
Anion gap: 13 (ref 5–15)
BUN: <5 mg/dL — ABNORMAL LOW (ref 8–23)
CO2: 21 mmol/L — ABNORMAL LOW (ref 22–32)
Calcium: 8.4 mg/dL — ABNORMAL LOW (ref 8.9–10.3)
Chloride: 104 mmol/L (ref 98–111)
Creatinine, Ser: 0.53 mg/dL (ref 0.44–1.00)
GFR, Estimated: >60 mL/min (ref >60)
Glucose, Bld: 71 mg/dL (ref 70–99)
Potassium: 3.4 mmol/L — ABNORMAL LOW (ref 3.5–5.1)
Sodium: 138 mmol/L (ref 135–145)

## 2024-07-28 LAB — CBC
HCT: 28.2 % — ABNORMAL LOW (ref 36.0–46.0)
Hemoglobin: 9.7 g/dL — ABNORMAL LOW (ref 12.0–15.0)
MCH: 32.8 pg (ref 26.0–34.0)
MCHC: 34.4 g/dL (ref 30.0–36.0)
MCV: 95.3 fL (ref 80.0–100.0)
Platelets: 202 K/uL (ref 150–400)
RBC: 2.96 MIL/uL — ABNORMAL LOW (ref 3.87–5.11)
RDW: 13.2 % (ref 11.5–15.5)
WBC: 6.4 K/uL (ref 4.0–10.5)
nRBC: 0 % (ref 0.0–0.2)

## 2024-07-28 LAB — IRON AND TIBC
Iron: 36 ug/dL (ref 28–170)
Saturation Ratios: 18 % (ref 10.4–31.8)
TIBC: 200 ug/dL — ABNORMAL LOW (ref 250–450)
UIBC: 164 ug/dL

## 2024-07-28 LAB — VITAMIN D 25 HYDROXY (VIT D DEFICIENCY, FRACTURES): Vit D, 25-Hydroxy: 21.36 ng/mL — ABNORMAL LOW (ref 30–100)

## 2024-07-28 LAB — VITAMIN B12: Vitamin B-12: 1149 pg/mL — ABNORMAL HIGH (ref 180–914)

## 2024-07-28 LAB — FERRITIN: Ferritin: 177 ng/mL (ref 11–307)

## 2024-07-28 LAB — PHOSPHORUS: Phosphorus: 3.6 mg/dL (ref 2.5–4.6)

## 2024-07-28 LAB — TSH: TSH: 3.942 u[IU]/mL (ref 0.350–4.500)

## 2024-07-28 LAB — MAGNESIUM: Magnesium: 2 mg/dL (ref 1.7–2.4)

## 2024-07-28 MED ORDER — SODIUM CHLORIDE 0.9 % IV SOLN: INTRAVENOUS | Status: AC

## 2024-07-28 MED ORDER — ROPINIROLE HCL ER 4 MG PO TB24: 4.0000 mg | ORAL_TABLET | Freq: Every day | ORAL | Status: DC

## 2024-07-28 MED ORDER — BISACODYL 5 MG PO TBEC
5.0000 mg | DELAYED_RELEASE_TABLET | Freq: Every day | ORAL | Status: DC | PRN
  Administered 2024-08-02 – 2024-08-04 (×2): 5 mg via ORAL
  Filled 2024-07-28 (×2): qty 1

## 2024-07-28 MED ORDER — MIRTAZAPINE 15 MG PO TABS
15.0000 mg | ORAL_TABLET | Freq: Every day | ORAL | Status: DC
  Administered 2024-07-28 – 2024-08-04 (×9): 15 mg via ORAL
  Filled 2024-07-28 (×9): qty 1

## 2024-07-28 MED ORDER — SENNOSIDES-DOCUSATE SODIUM 8.6-50 MG PO TABS: 1.0000 | ORAL_TABLET | Freq: Every evening | ORAL | Status: DC | PRN

## 2024-07-28 MED ORDER — CARBIDOPA-LEVODOPA 25-100 MG PO TABS
1.0000 | ORAL_TABLET | Freq: Every day | ORAL | Status: DC
  Administered 2024-07-28 – 2024-08-05 (×23): 1 via ORAL
  Filled 2024-07-28 (×30): qty 1

## 2024-07-28 MED ORDER — POTASSIUM CHLORIDE CRYS ER 20 MEQ PO TBCR
40.0000 meq | EXTENDED_RELEASE_TABLET | Freq: Once | ORAL | Status: AC
  Administered 2024-07-28: 40 meq via ORAL
  Filled 2024-07-28: qty 2

## 2024-07-28 MED ORDER — ACETAMINOPHEN 650 MG RE SUPP: 650.0000 mg | Freq: Four times a day (QID) | RECTAL | Status: DC | PRN

## 2024-07-28 MED ORDER — MIRTAZAPINE 15 MG PO TABS: 15.0000 mg | ORAL_TABLET | Freq: Every day | ORAL | Status: DC

## 2024-07-28 MED ORDER — ONDANSETRON HCL 4 MG/2ML IJ SOLN: 4.0000 mg | Freq: Four times a day (QID) | INTRAMUSCULAR | Status: DC | PRN

## 2024-07-28 MED ORDER — ONDANSETRON HCL 4 MG PO TABS: 4.0000 mg | ORAL_TABLET | Freq: Four times a day (QID) | ORAL | Status: DC | PRN

## 2024-07-28 MED ORDER — ROPINIROLE HCL ER 4 MG PO TB24
4.0000 mg | ORAL_TABLET | Freq: Every day | ORAL | Status: DC
  Administered 2024-07-28 – 2024-08-04 (×8): 4 mg via ORAL
  Filled 2024-07-28 (×9): qty 1

## 2024-07-28 MED ORDER — ACETAMINOPHEN 500 MG PO TABS
1000.0000 mg | ORAL_TABLET | Freq: Three times a day (TID) | ORAL | Status: DC
  Administered 2024-07-28 – 2024-08-05 (×24): 1000 mg via ORAL
  Filled 2024-07-28 (×25): qty 2

## 2024-07-28 MED ORDER — ROPINIROLE HCL ER 4 MG PO TB24
4.0000 mg | ORAL_TABLET | Freq: Every day | ORAL | Status: DC
  Administered 2024-07-28: 4 mg via ORAL
  Filled 2024-07-28 (×2): qty 1

## 2024-07-28 MED ORDER — ACETAMINOPHEN 325 MG PO TABS: 650.0000 mg | ORAL_TABLET | Freq: Four times a day (QID) | ORAL | Status: DC | PRN

## 2024-07-28 MED ORDER — ENOXAPARIN SODIUM 30 MG/0.3ML IJ SOSY
30.0000 mg | PREFILLED_SYRINGE | INTRAMUSCULAR | Status: DC
Start: 2024-07-28 — End: 2024-08-02
  Administered 2024-07-28 – 2024-08-02 (×6): 30 mg via SUBCUTANEOUS
  Filled 2024-07-28 (×6): qty 0.3

## 2024-07-28 MED ORDER — ROSUVASTATIN CALCIUM 5 MG PO TABS
5.0000 mg | ORAL_TABLET | Freq: Every day | ORAL | Status: DC
  Administered 2024-07-28 – 2024-08-05 (×9): 5 mg via ORAL
  Filled 2024-07-28 (×10): qty 1

## 2024-07-28 MED ORDER — OXYCODONE HCL 5 MG PO TABS
2.5000 mg | ORAL_TABLET | Freq: Four times a day (QID) | ORAL | Status: AC | PRN
Start: 2024-07-28 — End: ?
  Administered 2024-07-28 – 2024-07-30 (×4): 2.5 mg via ORAL
  Filled 2024-07-28 (×4): qty 1

## 2024-07-28 NOTE — Progress Notes (Signed)
  Echocardiogram 2D Echocardiogram has been performed.  Tinnie FORBES Gosling RDCS 07/28/2024, 2:30 PM

## 2024-07-28 NOTE — Evaluation (Signed)
 Occupational Therapy Evaluation Patient Details Name: Shannon Sutton MRN: [id number] DOB: 1945-12-23 Today's Date: 07/28/2024   History of Present Illness   Shannon Sutton is a 78 y.o. female who presented to [facility] 07/27/14 after fall and c/o weakness. CT head with no acute intracranial abnormality. Repeat LUE x-ray shows displaced acute transverse fracture through the proximal diaphysis of the left humerus. PMHx: Parkinson's disease, anxiety and depression, fibromyalgia, HTN, HLD, restless leg syndrome, urinary incontinence, and arthritis. Of note, recent hospitalization 07/25/24 d/t fall sustaining mildly displaced fracture of the proximal left humeral metaphysis.     Clinical Impressions Pt resides at [facility]. Previously independent with ADL tasks and history of falls reported by Pt family at bedside, endorsing increased occurrence of falls over the past week. Most recent falls resulting in head laceration and displaced acute transverse fracture through the proximal diaphysis of the left humerus. Pt is currently requiring Max A overall for ADL tasks and CGA for functional mobility without AD. Pt is primarily limited by decreased safety awareness, decreased peripheral vision, unsteadiness on feet with hx of falls, and non-functional use of BUE. Acute OT to follow Pt to facilitate progress towards goals. Patient will benefit from continued inpatient follow up therapy, <3 hours/day.       If plan is discharge home, recommend the following:   A little help with walking and/or transfers;A lot of help with bathing/dressing/bathroom;Assistance with cooking/housework;Direct supervision/assist for medications management;Assist for transportation     Functional Status Assessment   Patient has had a recent decline in their functional status and demonstrates the ability to make significant improvements in function in a reasonable and predictable amount of time.      Equipment Recommendations   None recommended by OT     Recommendations for Other Services         Precautions/Restrictions   Precautions Precautions: Fall;Other (comment) (Per 10/13 admission, Pt to maintain L sling immobilization full-time until follow-up with ortho) Recall of Precautions/Restrictions: Impaired Precaution/Restrictions Comments: Pt with observed impulsivity regarding mobility Required Braces or Orthoses: Sling Restrictions Weight Bearing Restrictions Per Provider Order: Yes Other Position/Activity Restrictions: Assumed LUE NWB per sling immobilization full-time direction in orthopedic note on 10/15.     Mobility Bed Mobility Overal bed mobility: Needs Assistance Bed Mobility: Supine to Sit     Supine to sit: Supervision, HOB elevated     General bed mobility comments: HOB elevated to simulate bed at home that raises to assist with bed mobility. Pt able to sit EOB by walking legs off R side of bed while keeping trunk in line with hips. Pt requried increased time.    Transfers Overall transfer level: Needs assistance Equipment used: None Transfers: Sit to/from Stand, Bed to chair/wheelchair/BSC Sit to Stand: Contact guard assist     Step pivot transfers: Contact guard assist     General transfer comment: Pt transferred from sit to stand with CGA, initiating movement prior to therapist cue. Pt with impulsivity, requiring immediate CGA from therapist for safety. Increased risk of falls evident based on hx of falls and decreased safety awareness. Assumed increased support needed for longer ambulation distances or standing endurance tasks.      Balance Overall balance assessment: Needs assistance, History of Falls Sitting-balance support: Single extremity supported, Feet supported Sitting balance-Leahy Scale: Fair Sitting balance - Comments: Pt sat EOB and able to scoot forward.   Standing balance support: No upper extremity supported, During  functional activity Standing balance-Leahy Scale: Fair  Standing balance comment: Pt unable to utilize RW d/t LUE immobilization in sling.                           ADL either performed or assessed with clinical judgement   ADL Overall ADL's : Needs assistance/impaired Eating/Feeding: Set up   Grooming: Moderate assistance   Upper Body Bathing: Maximal assistance   Lower Body Bathing: Maximal assistance   Upper Body Dressing : Maximal assistance   Lower Body Dressing: Maximal assistance       Toileting- Clothing Manipulation and Hygiene: Total assistance Toileting - Clothing Manipulation Details (indicate cue type and reason): Purewick in place       General ADL Comments: Pt with decreased BUE functional use impacting independence with ADL tasks.     Vision Patient Visual Report: Peripheral vision impairment Vision Assessment?: Vision impaired- to be further tested in functional context Additional Comments: Pt endorses decreased peripheral vision. Confrontation screening completed, Pt with slight reduced peripheral vision in each quadrant of both eyes. Will be further assessed during functional tasks.     Perception         Praxis         Pertinent Vitals/Pain Pain Assessment Pain Assessment: 0-10 Pain Score: 5  Pain Location: BUE Pain Descriptors / Indicators: Discomfort, Guarding Pain Intervention(s): Monitored during session, Limited activity within patient's tolerance     Extremity/Trunk Assessment Upper Extremity Assessment Upper Extremity Assessment: RUE deficits/detail;LUE deficits/detail RUE Deficits / Details: shoulder rotator cuff arthropathy. Limited shoulder flexion to about 15 degrees. Pt able to reach face and minimally cross midline. RUE: Shoulder pain with ROM RUE Sensation: WNL LUE Deficits / Details: Closed fracture of proximal end of left humerus with sling immobilization. Pt with good dexterity of hand and intact sensation. LUE:  Unable to fully assess due to immobilization LUE Sensation: WNL   Lower Extremity Assessment Lower Extremity Assessment: Defer to PT evaluation   Cervical / Trunk Assessment Cervical / Trunk Assessment: Normal   Communication Communication Communication: No apparent difficulties   Cognition Arousal: Alert Behavior During Therapy: WFL for tasks assessed/performed Cognition: No apparent impairments                               Following commands: Intact       Cueing  General Comments   Cueing Techniques: Verbal cues;Visual cues  Purewick replaced after transfer to recliner. Pt and family at bedside educated on importance of reducing fall risk in home. Family receptive to information, encouraging Pt to receive support. Pt states that she enjoys her independence.   Exercises     Shoulder Instructions      Home Living Family/patient expects to be discharged to:: Assisted living                             Home Equipment: Rolling Walker (2 wheels);Tub bench;Grab bars - tub/shower;Grab bars - toilet;Hand held shower head   Additional Comments: [facility]      Prior Functioning/Environment Prior Level of Function : Needs assist;History of Falls (last six months)       Physical Assist : Mobility (physical) Mobility (physical): Gait   Mobility Comments: Pt family at bedside state that they bought her a RW but she deos not use it. Hisotry of falls; about 5 in the past week. Pt states that she  feels like everything drains out of her and then she falls. ADLs Comments: Sponge bathes herself. Family will go grocery shopping for her each week and then prepare meals as needed. Pt only has microwave and does not cook. Staff brings medications. Per family report, Pt received therapy services at ALF before and did well.    OT Problem List: Decreased strength;Decreased range of motion;Decreased activity tolerance;Impaired balance (sitting and/or  standing);Impaired vision/perception;Decreased safety awareness;Decreased knowledge of use of DME or AE;Impaired UE functional use;Pain   OT Treatment/Interventions: Self-care/ADL training;Therapeutic exercise;Energy conservation;DME and/or AE instruction;Therapeutic activities;Patient/family education;Balance training;Visual/perceptual remediation/compensation      OT Goals(Current goals can be found in the care plan section)   Acute Rehab OT Goals Patient Stated Goal: To be independent OT Goal Formulation: With patient Time For Goal Achievement: 08/11/24 Potential to Achieve Goals: Good ADL Goals Pt Will Perform Upper Body Bathing: with min assist;sitting Pt Will Perform Lower Body Bathing: with min assist;sitting/lateral leans Pt Will Perform Upper Body Dressing: with min assist;sitting Pt Will Perform Lower Body Dressing: with min assist;sitting/lateral leans Pt Will Transfer to Toilet: with supervision;ambulating;grab bars Additional ADL Goal #1: Pt will return demonstration of visual compensatory scanning strategies during ADL task with Min verbal cues.   OT Frequency:  Min 2X/week    Co-evaluation              AM-PAC OT 6 Clicks Daily Activity     Outcome Measure Help from another person eating meals?: A Little Help from another person taking care of personal grooming?: A Lot Help from another person toileting, which includes using toliet, bedpan, or urinal?: A Lot Help from another person bathing (including washing, rinsing, drying)?: A Lot Help from another person to put on and taking off regular upper body clothing?: A Lot Help from another person to put on and taking off regular lower body clothing?: A Lot 6 Click Score: 13   End of Session Nurse Communication: Mobility status  Activity Tolerance: Patient tolerated treatment well Patient left: in chair;with call bell/phone within reach;with chair alarm set;with nursing/sitter in room;with family/visitor  present  OT Visit Diagnosis: Unsteadiness on feet (R26.81);Repeated falls (R29.6);Muscle weakness (generalized) (M62.81);Pain Pain - part of body: Arm;Shoulder (BUE)                Time: 9049-8973 OT Time Calculation (min): 36 min Charges:  OT General Charges $OT Visit: 1 Visit OT Evaluation $OT Eval Moderate Complexity: 1 Mod OT Treatments $Therapeutic Activity: 8-22 mins  Maurilio CROME, OTR/L.  [facility]  Office: [phone]   Maurilio PARAS Shannon Sutton 07/28/2024, 11:25 AM

## 2024-07-28 NOTE — TOC Initial Note (Signed)
 Transition of Care [facility]) - Initial/Assessment Note    Patient Details  Name: Shannon Sutton MRN: [id number] Date of Birth: [birth year]  Transition of Care [facility]) CM/SW Contact:    Shannon Cordella Simmonds, LCSW Phone Number: 07/28/2024, 3:54 PM  Clinical Narrative:  CSW spoke with pt regarding PT recommendation for SNF.  Pt from [facility], in assisted living there.  Pt is not excited about SNF and would prefer to return to [facility] with PT there.  Permission given to speak with daughters Shannon Sutton and Shannon Sutton.  CSW spoke with pt daughter Shannon Sutton, explained the above.  Shannon Sutton does believe would be stressful for pt to be in new SNF environment, would like to see if return to [facility] with PT there is an option.  Provided contact at [facility]: Shannon Sutton: [phone].  Message left with Shannon Sutton/Shannon Sutton.                    Barriers to Discharge: Other (must enter comment) (Return to [facility] ALF vs SNF)   Patient Goals and CMS Choice            Expected Discharge Plan and Services In-house Referral: Clinical Social Work     Living arrangements for the past 2 months: Assisted Living Facility (Shannon Sutton at [facility])                                      Prior Living Arrangements/Services Living arrangements for the past 2 months: Assisted [facility] (Shannon Sutton at [facility]) Lives with:: Facility Resident Patient language and need for interpreter reviewed:: Yes Do you feel safe going back to the place where you live?: Yes      Need for Family Participation in Patient Care: Yes (Comment) Care giver support system in place?: Yes (comment) Current home services: Other (comment) (none) Criminal Activity/Legal Involvement Pertinent to Current Situation/Hospitalization: No - Comment as needed  Activities of Daily Living      Permission Sought/Granted Permission sought to share information with : Family Supports Permission granted to share information with : Yes, Verbal  Permission Granted  Share Information with NAME: daughters Shannon Sutton and Shannon Sutton           Emotional Assessment Appearance:: Appears stated age Attitude/Demeanor/Rapport: Engaged Affect (typically observed): Appropriate, Pleasant Orientation: : Oriented to Self, Oriented to Place, Oriented to  Time, Oriented to Situation      Admission diagnosis:  Near syncope [R55] Anemia, unspecified type [D64.9] Other closed displaced fracture of proximal end of left humerus with routine healing, subsequent encounter [S42.292D] Patient Active Problem List   Diagnosis Date Noted   Fall 07/28/2024   Symptomatic anemia 07/28/2024   Closed fracture of proximal end of left humerus with routine healing 07/28/2024   Parkinson's disease (HCC) 07/28/2024   Hypokalemia 07/28/2024   Generalized weakness 07/28/2024   Near syncope 07/27/2024   Major depressive disorder, recurrent severe without psychotic features (HCC) 11/09/2021   Ataxia 11/23/2017   Generalized headaches 08/30/2014   Restless leg syndrome 12/15/2013   PCP:  Shannon Sutton, Tully GRADE, MD Pharmacy:   [facility] [place] [facility] [place] - [address] [place]  [place] [address] Phone: [phone] Fax: [phone]     Social Drivers of Health (SDOH) Social History: SDOH Screenings   Food Insecurity: No Food Insecurity (07/28/2024)  Housing: Low Risk  (07/28/2024)  Transportation Needs: No Transportation Needs (07/28/2024)  Utilities: Not At Risk (07/28/2024)  Alcohol Screen: Low Risk  (11/10/2021)  Depression (PHQ2-9): High Risk (04/28/2024)  Financial Resource Strain: Low Risk  (04/27/2024)  Physical Activity: Inactive (04/27/2024)  Social Connections: Unknown (07/28/2024)  Stress: Stress Concern Present (04/27/2024)  Tobacco Use: Medium Risk (07/28/2024)   SDOH Interventions:     Readmission Risk Interventions     No data to display

## 2024-07-28 NOTE — Plan of Care (Signed)

## 2024-07-28 NOTE — H&P (Addendum)
 History and Physical  Shannon Sutton FMW:[id number] DOB: 26-Feb-1946 DOA: 07/27/2024  PCP: Theophilus Andrews, Tully GRADE, MD   Chief Complaint: Fall, generalized weakness  HPI: Shannon Sutton is a 78 y.o. female with medical history significant for Parkinson's disease, anxiety and depression, fibromyalgia, HTN, HLD, restless leg syndrome, urinary incontinence and arthritis who presented to [facility] for evaluation of fall and weakness. Patient reports she was walking to the bathroom when she suddenly felt drained and weak then sunk to the floor. Reports she hit her head on the carpet sustaining laceration to the back of her head but did not lose consciousness.  She denies any dizziness, nausea, lightheadedness, chest pain or shortness of breath prior to the fall.  Patient denies any melena, hematochezia, hemoptysis, abdominal pain, hematuria or hematemesis. Of note, patient was seen in the ED on 10/3 for a fall and found to have fracture of the proximal left humerus. Left elbow was put in a sling and patient followed up with orthopedic surgery earlier today with plan to continue conservative management of this fracture.  ED Course: Initial vitals show patient afebrile, HR 80-100s, SpO2 98% on room air. Initial labs significant for sodium 133, K+ 3.2, glucose 105, normal renal function normal troponin, WBC 6.6, Hgb 8.6. CT head shows small left occipital scalp soft tissue swelling but no acute fracture or acute intracranial abnormality.  CT cervical spine shows marked severity multilevel degenerative changes but no acute fracture.  Pt received IV fentanyl and IV Ativan .  Patient was admitted to [facility] service and transferred to [place].  Review of Systems: Please see HPI for pertinent positives and negatives. A complete 10 system review of systems are otherwise negative.  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   Recurrent  Dr Ishmael 2009 had a bx/MRI: was on MTX and Prednisone   External hemorrhoids    Fibromyalgia with myofascial pain 11/16/2012   Headaches, cluster    History of CT scan of head 2022   History of EKG 2021   History of mammogram 2022   History of MRI 2022   Brain   Hypercholesterolemia 12/15/2013   Hypertension    Insomnia 11/16/2012   Myalgia and myositis, unspecified    Other disorder of muscle, ligament, and fascia    eosinophilic fascitis   Other specified disease of hair and hair follicles    Pain in joint, multiple sites    Parkinson's disease (HCC)    Restless leg syndrome 12/15/2013   Unspecified sleep apnea    Urine incontinence    Past Surgical History:  Procedure Laterality Date   ABDOMINAL HYSTERECTOMY     ABDOMINOPLASTY  2006   tummy tuck   BREAST REDUCTION SURGERY  1995   BREAST SURGERY     CATARACT EXTRACTION, BILATERAL     COLONOSCOPY  2018   MUSCLE BIOPSY  2009   DEEP TISSUE   REFRACTIVE SURGERY     TONSILLECTOMY     TOTAL ABDOMINAL HYSTERECTOMY  1999   WISDOM TOOTH EXTRACTION     Social History:  reports that she quit smoking about 37 years ago. Her smoking use included cigarettes. She has never used smokeless tobacco. She reports that she does not currently use alcohol. She  reports that she does not use drugs.  Allergies  Allergen Reactions   Crestor  [Rosuvastatin  Calcium ] Other (See Comments)    abd pain   Cymbalta  [Duloxetine  Hcl] Nausea Only   Effexor  Xr [Venlafaxine  Hcl Er] Other (See Comments)    dizziness   Morphine And Codeine Itching    agitation   Pravastatin  Other (See Comments)    abd pain   Promethazine-Codeine Itching    Itching in face   Rizatriptan  Other (See Comments)    Tingling, prickly sensation, feelings of palpitations, flushing.     Family History  Problem Relation Age of Onset    Colon polyps Mother    Heart disease Mother    Heart attack Mother    Heart attack Father    Colon cancer Father 18   Heart disease Father    COPD Sister    Emphysema Sister    COPD Brother    Emphysema Brother    Lung cancer Brother    Breast cancer Paternal Grandmother    Heart disease Paternal Grandfather    Colon polyps Paternal Grandfather    Heart disease Other        family history   Parkinson's disease Other    Stomach cancer Neg Hx    Rectal cancer Neg Hx    Esophageal cancer Neg Hx      Prior to Admission medications   Medication Sig Start Date End Date Taking? Authorizing Provider  carbidopa -levodopa  (SINEMET  IR) 25-100 MG tablet Take 3 tablets by mouth daily.    [provider]  docusate sodium  (COLACE) 100 MG capsule Take 1 capsule (100 mg total) by mouth every 12 (twelve) hours. 07/25/24   Harris, Abigail, PA-C  Melatonin 10 MG TABS Take 5-10 mg by mouth at bedtime.    [provider]  mirtazapine  (REMERON ) 15 MG tablet Take 15 mg by mouth at bedtime.    [provider]  OVER THE COUNTER MEDICATION as needed. PURZ for sleeping    [provider]  oxyCODONE (ROXICODONE) 5 MG immediate release tablet Take 0.5-1 tablets (2.5-5 mg total) by mouth every 4 (four) hours as needed for severe pain (pain score 7-10). 07/25/24   Harris, Abigail, PA-C  polyethylene glycol powder (GLYCOLAX /MIRALAX ) 17 GM/SCOOP powder 1-3 capfulls in 8oz water daily as needed for constipation 07/25/24   Harris, Abigail, PA-C  potassium chloride  SA (KLOR-CON  M) 20 MEQ tablet Take 1 tablet (20 mEq total) by mouth 2 (two) times daily for 5 days. 05/25/24 05/30/24  Theophilus Andrews, Tully GRADE, MD  rOPINIRole  (REQUIP ) 4 MG tablet Take 1 mg by mouth daily.    [provider]  rosuvastatin  (CRESTOR ) 5 MG tablet Take 1 tablet (5 mg total) by mouth daily. 05/02/24   Theophilus Andrews, Tully GRADE, MD    Physical Exam: BP (!) 149/70 (BP Location: Left Arm)   Pulse 96    Temp 98 F (36.7 C)   Resp 18   SpO2 98%  General: Pleasant, weak-appearing elderly woman laying in bed. No acute distress. HEENT: Small laceration to the back of her head, closed with staples. Anicteric sclera CV: RRR. No murmurs, rubs, or gallops. No LE edema Pulmonary: Lungs CTAB. Normal effort. No wheezing or rales. Abdominal: Soft, nontender, nondistended. Normal bowel sounds. MSK: Left shoulder and left flank.  Palpable pedal pulses. Skin: Warm and dry. No obvious rash or lesions. Neuro: A&Ox3. Moves all extremities. Normal sensation to light touch. No focal deficit. Psych: Normal mood and affect  Labs on Admission:  Basic Metabolic Panel: Recent Labs  Lab 07/27/24 1750  NA 133*  K 3.2*  CL 97*  CO2 23  GLUCOSE 105*  BUN 8  CREATININE 0.51  CALCIUM  9.0   Liver Function Tests: Recent Labs  Lab 07/27/24 1750  AST 26  ALT <5  ALKPHOS 77  BILITOT 0.5  PROT 5.5*  ALBUMIN 3.8   No results for input(s): LIPASE, AMYLASE in the last 168 hours. No results for input(s): AMMONIA in the last 168 hours. CBC: Recent Labs  Lab 07/27/24 1750  WBC 6.6  HGB 8.6*  HCT 24.9*  MCV 94.3  PLT 183   Cardiac Enzymes: No results for input(s): CKTOTAL, CKMB, CKMBINDEX, TROPONINI in the last 168 hours. BNP (last 3 results) No results for input(s): BNP in the last 8760 hours.  ProBNP (last 3 results) No results for input(s): PROBNP in the last 8760 hours.  CBG: No results for input(s): GLUCAP in the last 168 hours.  Radiological Exams on Admission: DG Lumbar Spine 2-3 Views Result Date: 07/27/2024 EXAM: 2 or 3 VIEW(S) XRAY OF THE LUMBAR SPINE 07/27/2024 06:14:00 PM COMPARISON: None available. CLINICAL HISTORY: fall, pain. Patient to ED via EMS from [place] at [place]. Pt reported weakness while walking to the bathroom and fell hitting head on door jam. Laceration to the back of patient's head, bleeding under control. Neck, head and right  knee pain reported. No blood ; thinners. FINDINGS: LIMITATIONS: Examination is limited secondary to technique. LUMBAR SPINE: BONES: The bones are osteopenic. No acute fracture. No aggressive appearing osseous lesion. Alignment is normal. DISCS AND DEGENERATIVE CHANGES: There is mild disc space narrowing at L4-L5 and L5-S1. No severe degenerative changes. SOFT TISSUES: No acute abnormality. IMPRESSION: 1. No acute osseous abnormality of the lumbar spine. 2. Mild degenerative change  at L4-L5 and L5-S1. 3. Osteopenia. Electronically signed by: Greig Pique MD 07/27/2024 06:28 PM EDT RP Workstation: [id number]   DG Knee 2 Views Right Result Date: 07/27/2024 EXAM: 2 VIEW(S) XRAY OF THE RIGHT KNEE 07/27/2024 06:14:00 PM COMPARISON: None available. CLINICAL HISTORY: fall. Patient to ED via EMS from [place] at [place]. No blood ; thinners. FINDINGS: BONES AND JOINTS: No acute fracture. No focal osseous lesion. No joint dislocation. No significant joint effusion. There is mild degenerative narrowing of the patellofemoral joint space. SOFT TISSUES: The soft tissues are unremarkable. IMPRESSION: 1. No acute osseous abnormality. 2. Patellofemoral osteoarthrosis, mild. Electronically signed by: Greig Pique MD 07/27/2024 06:27 PM EDT RP Workstation: [id number]   DG Shoulder Left Result Date: 07/27/2024 EXAM: 1 VIEW XRAY OF THE LEFT SHOULDER 07/27/2024 06:14:00 PM COMPARISON: None available. CLINICAL HISTORY: fall. Patient to ED via EMS from [place] at [place]. Pt reported weakness while walking to the bathroom and fell hitting head on door jam. Laceration to the back of patient's head, bleeding under control. Neck, head and right knee pain reported. No blood thinners. FINDINGS: BONES AND JOINTS: Glenohumeral joint is normally aligned. There is an acute transverse fracture through the proximal diaphysis of the left humerus. There is 1 shaft with anterior displacement of the distal fracture fragment with 4 cm of  overlap. There is no dislocation. The [facility] joint is unremarkable in appearance. SOFT TISSUES: There is overlying soft tissue swelling. No abnormal calcifications. Visualized lung is unremarkable. IMPRESSION: 1. Displaced Acute transverse fracture through the proximal diaphysis of the left humerus 2. No dislocation Electronically signed by: Greig Pique MD 07/27/2024 06:25 PM EDT RP Workstation: [id number]   DG Chest Portable  1 View Result Date: 07/27/2024 EXAM: 1 VIEW(S) XRAY OF THE CHEST 07/27/2024 06:14:00 PM COMPARISON: Chest x-ray 09/17/2022. CLINICAL HISTORY: fall. Patient to ED via EMS from [place] at [place]. Pt reported weakness while walking to the bathroom and fell hitting head on door jam. Laceration to the back of patient's head, bleeding under control. Neck, head and right knee pain reported. No blood ; thinners. FINDINGS: LUNGS AND PLEURA: Stable calcified granuloma in the right lower lobe. No pulmonary edema. No pleural effusion. No pneumothorax. HEART AND MEDIASTINUM: No acute abnormality of the cardiac and mediastinal silhouettes. BONES AND SOFT TISSUES: Proximal left humeral fracture is partially imaged. IMPRESSION: 1. No acute cardiopulmonary process. 2. Partially imaged proximal left humeral fracture. Electronically signed by: Greig Pique MD 07/27/2024 06:23 PM EDT RP Workstation: [id number]   CT Cervical Spine Wo Contrast Result Date: 07/27/2024 CLINICAL DATA:  Status post fall. EXAM: CT CERVICAL SPINE WITHOUT CONTRAST TECHNIQUE: Multidetector CT imaging of the cervical spine was performed without intravenous contrast. Multiplanar CT image reconstructions were also generated. RADIATION DOSE REDUCTION: This exam was performed according to the departmental dose-optimization program which includes automated exposure control, adjustment of the mA and/or kV according to patient size and/or use of iterative reconstruction technique. COMPARISON:  None Available. FINDINGS: Alignment: There is  approximately 1 mm anterolisthesis of the C2 vertebral body on C3, with 1 mm to 2 mm retrolisthesis of the C5 vertebral body on C6. Skull base and vertebrae: No acute fracture. No primary bone lesion or focal pathologic process. Soft tissues and spinal canal: No prevertebral fluid or swelling. No visible canal hematoma. Disc levels: Marked severity endplate sclerosis anterior osteophyte formation and posterior bony spurring are seen at the levels of C5-C6 and C6-C7. Mild to moderate severity similar appearing findings are seen at the level of C3-C4. There is marked severity narrowing of the anterior atlantoaxial articulation with marked severity intervertebral disc space narrowing at the levels of C5-C6 and C6-C7. Bilateral marked severity multilevel facet joint hypertrophy is noted. Upper chest: Negative. Other: Heterogeneous low-attenuation thyroid  nodules are seen within the left lobe of the thyroid  gland. IMPRESSION: 1. No acute fracture or traumatic subluxation. 2. Marked severity multilevel degenerative changes, most prominent at the levels of C5-C6 and C6-C7. 3. Low-attenuation thyroid  nodules, as described above. Correlation with nonemergent thyroid  ultrasound is recommended. Electronically Signed   By: Suzen Dials M.D.   On: 07/27/2024 18:16   CT Head Wo Contrast Result Date: 07/27/2024 CLINICAL DATA:  Status post fall. EXAM: CT HEAD WITHOUT CONTRAST TECHNIQUE: Contiguous axial images were obtained from the base of the skull through the vertex without intravenous contrast. RADIATION DOSE REDUCTION: This exam was performed according to the departmental dose-optimization program which includes automated exposure control, adjustment of the mA and/or kV according to patient size and/or use of iterative reconstruction technique. COMPARISON:  May 17, 2023 FINDINGS: Brain: There is generalized cerebral atrophy with widening of the extra-axial spaces and ventricular dilatation. There are areas of  decreased attenuation within the white matter tracts of the supratentorial brain, consistent with microvascular disease changes. Vascular: Moderate to marked severity bilateral cavernous carotid artery calcification is noted. Skull: Normal. Negative for fracture or focal lesion. Sinuses/Orbits: No acute finding. Other: Mild left occipital scalp soft tissue swelling is noted. IMPRESSION: 1. Generalized cerebral atrophy and microvascular disease changes of the supratentorial brain. 2. Mild left occipital scalp soft tissue swelling without evidence for an acute fracture or acute intracranial abnormality. Electronically Signed   By: Suzen Dials  M.D.   On: 07/27/2024 18:13   My independent interpretation of EKG: Sinus rhythm with slight flattening of the T waves  Assessment/Plan Shannon Sutton is a 78 y.o. female with medical history significant for Parkinson's disease, anxiety and depression, fibromyalgia, HTN, HLD, restless leg syndrome, urinary incontinence and arthritis who presented to [facility] for evaluation of fall and weakness and admitted for further evaluation.  # Fall # Generalized weakness - Elderly patient with Parkinson's disease presenting after her second fall in the last 3 days - Sustained a small laceration to the back of her head however CT head with no acute intracranial abnormality - Falls likely due to combination of her Parkinson's disease, acute anemia and hypokalemia - Start IV NS 100 cc/h for 10 hours - Check TSH, vitamin D  levels and electrolytes - PT/OT eval and treat - Fall precautions  # Symptomatic anemia - Hgb of 8.6 on admission from baseline of 12-13 - Pt presented with generalized weakness and fatigue but no evidence of active GI bleed - Patient remains hemodynamically stable - Check iron studies, ferritin and vitamin B12 - Trend CBC and Transfuse for Hgb goal > 7  # Hypokalemia - K+ low at 3.2 on admission, likely contributing to generalized  weakness - Give 40 mEq of KCl - F/u morning potassium and mag  # Left humerus fracture - Patient had follow-up with orthopedic in the outpatient today - Repeat x-ray shows displaced Acute transverse fracture through the proximal diaphysis of the left humerus - Continue left shoulder sling - Pain control with scheduled Tylenol  and as needed low-dose Oxy - Follow-up with orthopedic surgery in the outpatient  # Degenerative disc disease - Back imaging shows degenerative changes in the L-spine and C-spine with signs of osteopenia - Pain control as above  # Parkinson's disease - Patient on carbidopa  levodopa  however reports she is taking this 5 times a day and she starts feeling weak when it wears off - Resume appropriate dose of carbidopa  levodopa  after completion of full med rec  # Restless leg syndrome - Continue Requip  - Follow-up ferritin levels  # Anxiety and depression - Continue mirtazapine   # Hyperlipidemia - Continue rosuvastatin   DVT prophylaxis: Lovenox      Code Status: Full Code  Consults called: None  Family Communication: No family at bedside  Severity of Illness: The appropriate patient status for this patient is OBSERVATION. Observation status is judged to be reasonable and necessary in order to provide the required intensity of service to ensure the patient's safety. The patient's presenting symptoms, physical exam findings, and initial radiographic and laboratory data in the context of their medical condition is felt to place them at decreased risk for further clinical deterioration. Furthermore, it is anticipated that the patient will be medically stable for discharge from the hospital within 2 midnights of admission.   Level of care: Telemetry Medical   I personally spent a total of 75 minutes in the care of the patient today including preparing to see the patient, getting/reviewing separately obtained history, performing a medically appropriate  exam/evaluation, placing orders, documenting clinical information in the EHR, and independently interpreting results.   Lou Claretta CHRISTELLA, MD 07/28/2024, 2:27 AM [facility] Pager: [phone] Isaiah 41:10   If 7PM-7AM, please contact night-coverage [email or web address] Password [email or web address]

## 2024-07-28 NOTE — Evaluation (Signed)
 Physical Therapy Evaluation Patient Details Name: Shannon [facility] MRN: [id number] DOB: 1945/10/29 Today's Date: 07/28/2024  History of Present Illness  Shannon Sutton is a 78 y.o. female who presented to [facility] ED 07/27/14 after fall and c/o weakness. CT head with no acute intracranial abnormality. Repeat LUE x-ray shows displaced acute transverse fracture through the proximal diaphysis of the left humerus. PMHx: Parkinson's disease, anxiety and depression, fibromyalgia, HTN, HLD, restless leg syndrome, urinary incontinence, and arthritis. Of note, recent hospitalization 07/25/24 d/t fall sustaining mildly displaced fracture of the proximal left humeral metaphysis.   Clinical Impression  Pt admitted with above diagnosis. PTA, pt was independent for functional mobility without AD and independent with ADLs. She has had 5 falls in the past week, 2 of which resulted in hospitalization and sustained injury. Family reports purchasing pt a RW, but she does not use it. Pt resides at [facility]. Pt currently with functional limitations due to the deficits listed below (see PT Problem List). She required CGA for bed mobility, transfers, and gait using quad cane in RUE. Pt is currently limited by pain, weight bearing status, impaired balance, and decreased activity tolerance. She maintained LUE NWB at all times and immobilized in sling. Pt reported an 8/10 on the modified RPE scale after short-distance ambulation. Pt will benefit from acute skilled PT to increase their independence and safety with mobility to allow discharge. Recommend continued inpatient follow up therapy, <3 hours/day.    If plan is discharge home, recommend the following: A little help with walking and/or transfers;A little help with bathing/dressing/bathroom;Assistance with cooking/housework;Assist for transportation;Help with stairs or ramp for entrance   Can travel by private vehicle   Yes    Equipment Recommendations  Cane  Recommendations for Other Services       Functional Status Assessment Patient has had a recent decline in their functional status and demonstrates the ability to make significant improvements in function in a reasonable and predictable amount of time.     Precautions / Restrictions Precautions Precautions: Fall Recall of Precautions/Restrictions: Impaired Precaution/Restrictions Comments: LUE to be in sling immobilization full-time for the next 10 days Required Braces or Orthoses: Sling Restrictions Weight Bearing Restrictions Per Provider Order: Yes LUE Weight Bearing Per Provider Order: Non weight bearing      Mobility  Bed Mobility Overal bed mobility: Needs Assistance Bed Mobility: Supine to Sit     Supine to sit: Contact guard, HOB elevated     General bed mobility comments: Pt sat up on R side of bed with increased time. She slowly brought BLE off EOB. Assist to elevate trunk and scoot hips fwd til feet flat.    Transfers Overall transfer level: Needs assistance Equipment used: Quad cane Transfers: Sit to/from Stand Sit to Stand: Contact guard assist   Step pivot transfers: Contact guard assist       General transfer comment: Pt stood from lowest bed height pushing up with RUE. Powered up with light assist. Transition RUE onto quad cane. Transferred to recliner chair. Good eccentric control.    Ambulation/Gait Ambulation/Gait assistance: Contact guard assist Gait Distance (Feet): 40 Feet Assistive device: Quad cane Gait Pattern/deviations: Step-to pattern, Decreased step length - right, Decreased step length - left, Decreased stride length, Narrow base of support Gait velocity: decreased Gait velocity interpretation: <1.31 ft/sec, indicative of household ambulator   General Gait Details: Introduced quad cane and educated pt on proper and safe use of AD. Demonstrated correct sequencing. Instructed pt to advance  quad cane in RUE at the same time as LLE.  She took short, small, slow steps. Cued pt to increase BOS to shoulder width apart with limited adjustment noted. She demonstrated minimal foot clearance. Distance limited d/t fatigue.  Stairs            Wheelchair Mobility     Tilt Bed    Modified Rankin (Stroke Patients Only)       Balance Overall balance assessment: Needs assistance, History of Falls Sitting-balance support: Single extremity supported, Feet supported Sitting balance-Leahy Scale: Fair     Standing balance support: Single extremity supported, During functional activity, Reliant on assistive device for balance Standing balance-Leahy Scale: Fair                               Pertinent Vitals/Pain Pain Assessment Pain Assessment: Faces Faces Pain Scale: Hurts little more Pain Location: LUE Pain Descriptors / Indicators: Grimacing, Discomfort, Aching Pain Intervention(s): Monitored during session, Limited activity within patient's tolerance, Repositioned    Home Living Family/patient expects to be discharged to:: Assisted living ([facility])                 Home Equipment: Agricultural consultant (2 wheels);Tub bench;Grab bars - tub/shower;Grab bars - toilet;Hand held shower head      Prior Function Prior Level of Function : Independent/Modified Independent;History of Falls (last six months);Needs assist       Physical Assist : ADLs (physical)   ADLs (physical): IADLs Mobility Comments: Ambulates without AD. Family reports they purchased pt a RW, but she does not use it. Family reports about 5 falls in the past week, 2 resulting in hospitalization. Pt says she suddenly feels drained and weak resulting in falls. ADLs Comments: Sponge bathes herself. Family will go grocery shopping for her each week and then prepare meals as needed. Pt only has microwave and does not cook. Staff brings medications.     Extremity/Trunk Assessment   Upper Extremity Assessment Upper Extremity  Assessment: Defer to OT evaluation    Lower Extremity Assessment Lower Extremity Assessment: Overall WFL for tasks assessed    Cervical / Trunk Assessment Cervical / Trunk Assessment: Normal  Communication   Communication Communication: No apparent difficulties    Cognition Arousal: Alert Behavior During Therapy: WFL for tasks assessed/performed   PT - Cognitive impairments: No apparent impairments                       PT - Cognition Comments: Pt A,Ox4 Following commands: Intact       Cueing Cueing Techniques: Verbal cues     General Comments General comments (skin integrity, edema, etc.): VSS on RA. Pt reported an 8/10 on the modified RPE scale at end of session.    Exercises     Assessment/Plan    PT Assessment Patient needs continued PT services  PT Problem List Decreased balance;Decreased mobility;Decreased activity tolerance;Decreased safety awareness       PT Treatment Interventions DME instruction;Gait training;Functional mobility training;Therapeutic activities;Therapeutic exercise;Balance training;Patient/family education    PT Goals (Current goals can be found in the Care Plan section)  Acute Rehab PT Goals Patient Stated Goal: Stop falling PT Goal Formulation: With patient Time For Goal Achievement: 08/11/24 Potential to Achieve Goals: Good    Frequency Min 2X/week     Co-evaluation               AM-PAC PT 6  Clicks Mobility  Outcome Measure Help needed turning from your back to your side while in a flat bed without using bedrails?: A Little Help needed moving from lying on your back to sitting on the side of a flat bed without using bedrails?: A Little Help needed moving to and from a bed to a chair (including a wheelchair)?: A Little Help needed standing up from a chair using your arms (e.g., wheelchair or bedside chair)?: A Little Help needed to walk in hospital room?: A Little Help needed climbing 3-5 steps with a railing?  : A Lot 6 Click Score: 17    End of Session Equipment Utilized During Treatment: Gait belt Activity Tolerance: Patient tolerated treatment well;Patient limited by fatigue Patient left: in chair;with call bell/phone within reach;with chair alarm set Nurse Communication: Mobility status PT Visit Diagnosis: History of falling (Z91.81);Repeated falls (R29.6);Unsteadiness on feet (R26.81);Other abnormalities of gait and mobility (R26.89)    Time: 1432-1450 PT Time Calculation (min) (ACUTE ONLY): 18 min   Charges:   PT Evaluation $PT Eval Moderate Complexity: 1 Mod   PT General Charges $$ ACUTE PT VISIT: 1 Visit         Randall SAUNDERS, PT, DPT Acute Rehabilitation Services Office: [phone] Secure Chat Preferred  Delon CHRISTELLA Callander 07/28/2024, 3:22 PM

## 2024-07-28 NOTE — TOC CAGE-AID Note (Signed)
 Transition of Care [facility]) - CAGE-AID Screening   Patient Details  Name: Shannon Sutton MRN: [id number] Date of Birth: 05-25-1946  [facility] of [facility]) CM/SW Contact:    Kiela Shisler E Madhavi Hamblen, LCSW Phone Number: 07/28/2024, 9:46 AM   Clinical Narrative: No SA noted.   CAGE-AID Screening:    Have You Ever Felt You Ought to Cut Down on Your Drinking or Drug Use?: No Have People Annoyed You By Critizing Your Drinking Or Drug Use?: No Have You Felt Bad Or Guilty About Your Drinking Or Drug Use?: No Have You Ever Had a Drink or Used Drugs First Thing In The Morning to Steady Your Nerves or to Get Rid of a Hangover?: No CAGE-AID Score: 0  Substance Abuse Education Offered: No

## 2024-07-28 NOTE — Progress Notes (Signed)
 Office Visit Note   Patient: Shannon Sutton           Date of Birth: August 18, 1946           MRN: [id number] Visit Date: 07/27/2024 Requested by: Theophilus Andrews, Tully GRADE, MD [address] [place],  [place] [address] PCP: Theophilus Andrews, Tully GRADE, MD  Subjective: Chief Complaint  Patient presents with   Left Upper Arm - Injury    HPI: Shannon Sutton is a 78 y.o. female who presents to the office reporting left shoulder pain.  Patient had a fall on 07/25/2024.  She lives at an assisted living facility.  Here with her daughter.  Taking Advil for pain.  Oxycodone has been prescribed by the emergency department.  She does have significant rotator cuff arthropathy on the right-hand side for which she does not desire any intervention.  It does limit her functional mobility in the shoulder to less than about 50 degrees of abduction and forward flexion..                ROS: All systems reviewed are negative as they relate to the chief complaint within the history of present illness.  Patient denies fevers or chills.  Assessment & Plan: Visit Diagnoses:  1. Other closed nondisplaced fracture of proximal end of left humerus, initial encounter     Plan: Impression is left proximal humerus fracture through the surgical neck.  Not really too displaced at this time onto fused.  Shoulder is located.  She has a history of rotator cuff arthropathy in the right shoulder.  In its current alignment it will likely heal.  I think there is a chance that it may displace.  Plan at this time is sling immobilization full-time for the next 10 days and then we will see her back in 10 days with AP radiographs and scapular Y views just to assess shaft displacement relative to the head.  In general this fracture in isolation would not require fixation but considering her right shoulder function she is somewhat more gray zone for operative fixation of this left fracture of the proximal humerus.  Follow-Up  Instructions: No follow-ups on file.   Orders:  No orders of the defined types were placed in this encounter.  No orders of the defined types were placed in this encounter.     Procedures: No procedures performed   Clinical Data: No additional findings.  Objective: Vital Signs: There were no vitals taken for this visit.  Physical Exam:  Constitutional: Patient appears well-developed HEENT:  Head: Normocephalic Eyes:EOM are normal Neck: Normal range of motion Cardiovascular: Normal rate Pulmonary/chest: Effort normal Neurologic: Patient is alert Skin: Skin is warm Psychiatric: Patient has normal mood and affect  Ortho Exam: Ortho exam demonstrates bruising and ecchymosis in that left arm.  Radial pulses intact.  EPL FPL interosseous strength is intact.  No paresthesias in the deltoid region.  Right arm has diminished forward flexion and abduction but does have good elbow strength.  With biceps and triceps function.  Specialty Comments:  No specialty comments available.  Imaging: DG Lumbar Spine 2-3 Views Result Date: 07/27/2024 EXAM: 2 or 3 VIEW(S) XRAY OF THE LUMBAR SPINE 07/27/2024 06:14:00 PM COMPARISON: None available. CLINICAL HISTORY: fall, pain. [facility] to ED [facility] Pt reported weakness while walking to the bathroom and fell hitting head on door jam. Laceration to the back of patient's head, bleeding under control. Neck, head and right knee pain  reported. No blood ; thinners. FINDINGS: LIMITATIONS: Examination is limited secondary to technique. LUMBAR SPINE: BONES: The bones are osteopenic. No acute fracture. No aggressive appearing osseous lesion. Alignment is normal. DISCS AND DEGENERATIVE CHANGES: There is mild disc space narrowing at L4-L5 and L5-S1. No severe degenerative changes. SOFT TISSUES: No acute abnormality. IMPRESSION: 1. No acute osseous abnormality of the lumbar spine. 2. Mild degenerative change  at L4-L5 and L5-S1. 3.  Osteopenia. Electronically signed by: Greig Pique MD 07/27/2024 06:28 PM EDT RP Workstation: [id number]   DG Knee 2 Views Right Result Date: 07/27/2024 EXAM: 2 VIEW(S) XRAY OF THE RIGHT KNEE 07/27/2024 06:14:00 PM COMPARISON: None available. CLINICAL HISTORY: fall. Patient to ED via EMS from [facility] at [facility]. No blood ; thinners. FINDINGS: BONES AND JOINTS: No acute fracture. No focal osseous lesion. No joint dislocation. No significant joint effusion. There is mild degenerative narrowing of the patellofemoral joint space. SOFT TISSUES: The soft tissues are unremarkable. IMPRESSION: 1. No acute osseous abnormality. 2. Patellofemoral osteoarthrosis, mild. Electronically signed by: Greig Pique MD 07/27/2024 06:27 PM EDT RP Workstation: [id number]   DG Shoulder Left Result Date: 07/27/2024 EXAM: 1 VIEW XRAY OF THE LEFT SHOULDER 07/27/2024 06:14:00 PM COMPARISON: None available. CLINICAL HISTORY: fall. Patient to ED via EMS from [facility]. Pt reported weakness while walking to the bathroom and fell hitting head on door jam. Laceration to the back of patient's head, bleeding under control. Neck, head and right knee pain reported. No blood thinners. FINDINGS: BONES AND JOINTS: Glenohumeral joint is normally aligned. There is an acute transverse fracture through the proximal diaphysis of the left humerus. There is 1 shaft with anterior displacement of the distal fracture fragment with 4 cm of overlap. There is no dislocation. The [facility] joint is unremarkable in appearance. SOFT TISSUES: There is overlying soft tissue swelling. No abnormal calcifications. Visualized lung is unremarkable. IMPRESSION: 1. Displaced Acute transverse fracture through the proximal diaphysis of the left humerus 2. No dislocation Electronically signed by: Greig Pique MD 07/27/2024 06:25 PM EDT RP Workstation: [id number]   DG Chest Portable 1 View Result Date: 07/27/2024 EXAM: 1 VIEW(S) XRAY OF THE CHEST 07/27/2024  06:14:00 PM COMPARISON: Chest x-ray 09/17/2022. CLINICAL HISTORY: fall. Patient to ED via EMS from [facility]. Pt reported weakness while walking to the bathroom and fell hitting head on door jam. Laceration to the back of patient's head, bleeding under control. Neck, head and right knee pain reported. No blood ; thinners. FINDINGS: LUNGS AND PLEURA: Stable calcified granuloma in the right lower lobe. No pulmonary edema. No pleural effusion. No pneumothorax. HEART AND MEDIASTINUM: No acute abnormality of the cardiac and mediastinal silhouettes. BONES AND SOFT TISSUES: Proximal left humeral fracture is partially imaged. IMPRESSION: 1. No acute cardiopulmonary process. 2. Partially imaged proximal left humeral fracture. Electronically signed by: Greig Pique MD 07/27/2024 06:23 PM EDT RP Workstation: [id number]   CT Cervical Spine Wo Contrast Result Date: 07/27/2024 CLINICAL DATA:  Status post fall. EXAM: CT CERVICAL SPINE WITHOUT CONTRAST TECHNIQUE: Multidetector CT imaging of the cervical spine was performed without intravenous contrast. Multiplanar CT image reconstructions were also generated. RADIATION DOSE REDUCTION: This exam was performed according to the departmental dose-optimization program which includes automated exposure control, adjustment of the mA and/or kV according to patient size and/or use of iterative reconstruction technique. COMPARISON:  None Available. FINDINGS: Alignment: There is approximately 1 mm anterolisthesis of the C2 vertebral body on C3, with 1 mm to 2 mm retrolisthesis of the C5 vertebral  body on C6. Skull base and vertebrae: No acute fracture. No primary bone lesion or focal pathologic process. Soft tissues and spinal canal: No prevertebral fluid or swelling. No visible canal hematoma. Disc levels: Marked severity endplate sclerosis anterior osteophyte formation and posterior bony spurring are seen at the levels of C5-C6 and C6-C7. Mild to moderate severity similar  appearing findings are seen at the level of C3-C4. There is marked severity narrowing of the anterior atlantoaxial articulation with marked severity intervertebral disc space narrowing at the levels of C5-C6 and C6-C7. Bilateral marked severity multilevel facet joint hypertrophy is noted. Upper chest: Negative. Other: Heterogeneous low-attenuation thyroid  nodules are seen within the left lobe of the thyroid  gland. IMPRESSION: 1. No acute fracture or traumatic subluxation. 2. Marked severity multilevel degenerative changes, most prominent at the levels of C5-C6 and C6-C7. 3. Low-attenuation thyroid  nodules, as described above. Correlation with nonemergent thyroid  ultrasound is recommended. Electronically Signed   By: Suzen Dials M.D.   On: 07/27/2024 18:16   CT Head Wo Contrast Result Date: 07/27/2024 CLINICAL DATA:  Status post fall. EXAM: CT HEAD WITHOUT CONTRAST TECHNIQUE: Contiguous axial images were obtained from the base of the skull through the vertex without intravenous contrast. RADIATION DOSE REDUCTION: This exam was performed according to the departmental dose-optimization program which includes automated exposure control, adjustment of the mA and/or kV according to patient size and/or use of iterative reconstruction technique. COMPARISON:  May 17, 2023 FINDINGS: Brain: There is generalized cerebral atrophy with widening of the extra-axial spaces and ventricular dilatation. There are areas of decreased attenuation within the white matter tracts of the supratentorial brain, consistent with microvascular disease changes. Vascular: Moderate to marked severity bilateral cavernous carotid artery calcification is noted. Skull: Normal. Negative for fracture or focal lesion. Sinuses/Orbits: No acute finding. Other: Mild left occipital scalp soft tissue swelling is noted. IMPRESSION: 1. Generalized cerebral atrophy and microvascular disease changes of the supratentorial brain. 2. Mild left occipital  scalp soft tissue swelling without evidence for an acute fracture or acute intracranial abnormality. Electronically Signed   By: Suzen Dials M.D.   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/09/2021   Ataxia 11/23/2017   Generalized headaches 08/30/2014   Restless leg syndrome 12/15/2013   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   Recurrent  Dr Ishmael 2009 had a bx/MRI: was on MTX and Prednisone   External hemorrhoids    Fibromyalgia with myofascial pain 11/16/2012   Headaches, cluster    History of CT scan of head 2022   History of EKG 2021   History of mammogram 2022   History of MRI 2022   Brain   Hypercholesterolemia 12/15/2013   Hypertension    Insomnia 11/16/2012   Myalgia and myositis, unspecified    Other disorder of muscle, ligament, and fascia    eosinophilic fascitis   Other specified  disease of hair and hair follicles    Pain in joint, multiple sites    Parkinson's disease (HCC)    Restless leg syndrome 12/15/2013   Unspecified sleep apnea    Urine incontinence     Family History  Problem Relation Age of Onset   Colon polyps Mother    Heart disease Mother    Heart attack Mother    Heart attack Father    Colon cancer Father 81   Heart disease Father    COPD Sister    Emphysema Sister    COPD Brother    Emphysema Brother    Lung cancer Brother    Breast  cancer Paternal Grandmother    Heart disease Paternal Grandfather    Colon polyps Paternal Grandfather    Heart disease Other        family history   Parkinson's disease Other    Stomach cancer Neg Hx    Rectal cancer Neg Hx    Esophageal cancer Neg Hx     Past Surgical History:  Procedure Laterality Date   ABDOMINAL HYSTERECTOMY     ABDOMINOPLASTY  2006   tummy tuck   BREAST REDUCTION SURGERY  1995   BREAST SURGERY     CATARACT EXTRACTION, BILATERAL     COLONOSCOPY  2018   MUSCLE BIOPSY  2009   DEEP TISSUE   REFRACTIVE SURGERY     TONSILLECTOMY     TOTAL ABDOMINAL HYSTERECTOMY  1999   WISDOM TOOTH EXTRACTION     Social History   Occupational History   Occupation: Retired    Associate Professor: RETIRED    Comment: Development worker, community  Tobacco Use   Smoking status: Former    Current packs/day: 0.00    Types: Cigarettes    Quit date: 12/19/1986    Years since quitting: 37.6   Smokeless tobacco: Never  Vaping Use   Vaping status: Never Used  Substance and Sexual Activity   Alcohol use: Not Currently   Drug use: No   Sexual activity: Yes    Birth control/protection: Surgical, Post-menopausal

## 2024-07-28 NOTE — Progress Notes (Signed)
 PROGRESS NOTE    Shannon Sutton  FMW:[id number] DOB: September 19, 1946 DOA: 07/27/2024 PCP: Shannon Sutton  Chief Complaint  Patient presents with   Fall   Weakness    Brief Narrative:   Shannon Sutton is Shannon Sutton 78 y.o. female with medical history significant for Parkinson's disease, anxiety and depression, fibromyalgia, HTN, HLD, restless leg syndrome, urinary incontinence and arthritis who presented to [facility] for evaluation of fall and weakness and admitted for further evaluation.   Assessment & Plan:   Principal Problem:   Fall Active Problems:   Near syncope   Symptomatic anemia   Closed fracture of proximal end of left humerus with routine healing   Parkinson's disease (HCC)   Hypokalemia   Generalized weakness  # Fall # Generalized weakness # Orthostatic Hypotension  - increased falls in the past few weeks.  In setting of her parkinson's disease.  Also, recent increase in dose of her sinemet  (from 1 pill 5 times Shannon Sutton day to 1.5 pills 4 times daily and 1 pill nightly).  She's orthostatic, which I suspect is related to parkinsons +/- sinemet .  Also with new anemia, but I doubt this is playing Shannon Sutton major role given stability and mild anemia with hb in 9's. - echo with preserved EF, no AS - Start IV NS 100 cc/h for 10 hours - TSH wnl - PT/OT eval and treat - Fall precautions - compression stockings, change positions slowly, elevated HOB - given increasing falls after increasing sinemet  dose, will resume dose from Shannon Sutton few weeks ago (if possible, will reach out to her neurologist)   # Symptomatic anemia - Hgb of 8.6 on admission from baseline of 12-13 - stable today around 9.6 - no clear indication for transfusion at this point - labs consistent with low normal iron.  Normal b12.  Follow folate.  - follow retic, diff - warrants additional outpatient follow up for her new anemia   # Hypokalemia - replace and follow  # Left humerus fracture - Patient had follow-up  with orthopedic in the outpatient today - Repeat x-ray shows displaced Acute transverse fracture through the proximal diaphysis of the left humerus - Continue left shoulder sling - Pain control with scheduled Tylenol  and as needed low-dose Oxy - Follow-up with orthopedic surgery in the outpatient - plan from 10/15 notes sling immobilization full time for next 10 days, follow up for x rays   # Degenerative disc disease - Back imaging shows degenerative changes in the L-spine and C-spine with signs of osteopenia - Pain control as above   # Parkinson's disease - Patient on carbidopa  levodopa  however reports she is taking this 5 times [name] day and she starts feeling weak when it wears off   # Restless leg syndrome - Continue Requip  - Follow-up ferritin levels   # Anxiety and depression - Continue mirtazapine    # Hyperlipidemia - Continue rosuvastatin     DVT prophylaxis: lovenox  Code Status: full Family Communication: 2 daughters at bedside Disposition:   Status is: Observation The patient will require care spanning > 2 midnights and should be moved to inpatient because: need for continued inpatient care   Consultants:    Procedures:  Echo IMPRESSIONS     1. Left ventricular ejection fraction, by estimation, is 60 to 65%. The  left ventricle has normal function. The left ventricle has no regional  wall motion abnormalities. Left ventricular diastolic parameters were  normal.   2. Right ventricular systolic function is normal. The  right ventricular  size is normal. Tricuspid regurgitation signal is inadequate for assessing  PA pressure.   3. The mitral valve is normal in structure. Mild mitral valve  regurgitation. No evidence of mitral stenosis.   4. Tricuspid valve regurgitation is mild to moderate.   5. The aortic valve is normal in structure. Aortic valve regurgitation is  mild. No aortic stenosis is present.   6. There is mild dilatation of the ascending aorta,  measuring 39 mm.   7. The inferior vena cava is normal in size with greater than 50%  respiratory variability, suggesting right atrial pressure of 3 mmHg.   Antimicrobials:  Anti-infectives (From admission, onward)    None       Subjective: No new complaints Issues started after  Objective: Vitals:   07/28/24 0013 07/28/24 0058 07/28/24 0832 07/28/24 1502  BP: (!) 149/70  (!) 155/71 (!) 112/59  Pulse: (!) 111 96 (!) 111 (!) 109  Resp: 18  16 16   Temp: 98 F (36.7 C)  98.2 F (36.8 C) 98 F (36.7 C)  TempSrc:   Oral Oral  SpO2: 98%  99% 98%    Intake/Output Summary (Last 24 hours) at 07/28/2024 1751 Last data filed at 07/28/2024 1500 Gross per 24 hour  Intake 792.61 ml  Output 300 ml  Net 492.61 ml   There were no vitals filed for this visit.  Examination:  General exam: Appears calm and comfortable  Respiratory system: unlabored Cardiovascular system: RRR Gastrointestinal system: Abdomen is nondistended, soft and nontender. Central nervous system: Alert and oriented. No focal neurological deficits. Extremities: no LEE    Data Reviewed: I have personally reviewed following labs and imaging studies  CBC: Recent Labs  Lab 07/27/24 1750 07/28/24 0459 07/28/24 1416  WBC 6.6 6.4  --   HGB 8.6* 9.7* 9.6*  HCT 24.9* 28.2* 28.3*  MCV 94.3 95.3  --   PLT 183 202  --     Basic Metabolic Panel: Recent Labs  Lab 07/27/24 1750 07/28/24 0459  NA 133* 138  K 3.2* 3.4*  CL 97* 104  CO2 23 21*  GLUCOSE 105* 71  BUN 8 <5*  CREATININE 0.51 0.53  CALCIUM  9.0 8.4*  MG  --  2.0  PHOS  --  3.6    GFR: CrCl cannot be calculated (Unknown ideal weight.).  Liver Function Tests: Recent Labs  Lab 07/27/24 1750  AST 26  ALT <5  ALKPHOS 77  BILITOT 0.5  PROT 5.5*  ALBUMIN 3.8    CBG: No results for input(s): GLUCAP in the last 168 hours.   No results found for this or any previous visit (from the past 240 hours).       Radiology  Studies: ECHOCARDIOGRAM COMPLETE Result Date: 07/28/2024    ECHOCARDIOGRAM REPORT   Patient Name:   Shannon Sutton Date of Exam: 07/28/2024 Medical Rec #:  [id number]      Height:       61.0 in Accession #:    [id number]     Weight:       124.9 lb Date of Birth:  10-05-1946      BSA:          1.546 m Patient Age:    78 years       BP:           105/71 mmHg Patient Gender: F              HR:  106 bpm. Exam Location:  Inpatient Procedure: 2D Echo, Color Doppler and Cardiac Doppler (Both Spectral and Color            Flow Doppler were utilized during procedure). Indications:    Syncope R55  History:        Patient has no prior history of Echocardiogram examinations.  Sonographer:    Tinnie Gosling RDCS Referring Phys: [id number] Landis Cassaro CALDWELL POWELL JR IMPRESSIONS  1. Left ventricular ejection fraction, by estimation, is 60 to 65%. The left ventricle has normal function. The left ventricle has no regional wall motion abnormalities. Left ventricular diastolic parameters were normal.  2. Right ventricular systolic function is normal. The right ventricular size is normal. Tricuspid regurgitation signal is inadequate for assessing PA pressure.  3. The mitral valve is normal in structure. Mild mitral valve regurgitation. No evidence of mitral stenosis.  4. Tricuspid valve regurgitation is mild to moderate.  5. The aortic valve is normal in structure. Aortic valve regurgitation is mild. No aortic stenosis is present.  6. There is mild dilatation of the ascending aorta, measuring 39 mm.  7. The inferior vena cava is normal in size with greater than 50% respiratory variability, suggesting right atrial pressure of 3 mmHg. FINDINGS  Left Ventricle: Left ventricular ejection fraction, by estimation, is 60 to 65%. The left ventricle has normal function. The left ventricle has no regional wall motion abnormalities. The left ventricular internal cavity size was normal in size. There is  no left ventricular hypertrophy. Left  ventricular diastolic parameters were normal. Right Ventricle: The right ventricular size is normal. No increase in right ventricular wall thickness. Right ventricular systolic function is normal. Tricuspid regurgitation signal is inadequate for assessing PA pressure. Left Atrium: Left atrial size was normal in size. Right Atrium: Right atrial size was normal in size. Pericardium: There is no evidence of pericardial effusion. Mitral Valve: The mitral valve is normal in structure. Mild mitral valve regurgitation. No evidence of mitral valve stenosis. Tricuspid Valve: The tricuspid valve is normal in structure. Tricuspid valve regurgitation is mild to moderate. No evidence of tricuspid stenosis. Aortic Valve: The aortic valve is normal in structure. Aortic valve regurgitation is mild. No aortic stenosis is present. Pulmonic Valve: The pulmonic valve was normal in structure. Pulmonic valve regurgitation is not visualized. No evidence of pulmonic stenosis. Aorta: There is mild dilatation of the ascending aorta, measuring 39 mm. Venous: The inferior vena cava is normal in size with greater than 50% respiratory variability, suggesting right atrial pressure of 3 mmHg. IAS/Shunts: No atrial level shunt detected by color flow Doppler.  LEFT VENTRICLE PLAX 2D LVIDd:         3.30 cm   Diastology LVIDs:         1.30 cm   LV e' medial:    6.31 cm/s LV PW:         0.80 cm   LV E/e' medial:  12.3 LV IVS:        0.80 cm   LV e' lateral:   8.27 cm/s LVOT diam:     2.10 cm   LV E/e' lateral: 9.4 LV SV:         94 LV SV Index:   61 LVOT Area:     3.46 cm  RIGHT VENTRICLE RV S prime:     19.20 cm/s TAPSE (M-mode): 1.8 cm LEFT ATRIUM           Index        RIGHT ATRIUM  Index LA diam:      2.40 cm 1.55 cm/m   RA Area:     9.51 cm LA Vol (A2C): 9.6 ml  6.18 ml/m   RA Volume:   16.50 ml 10.67 ml/m LA Vol (A4C): 33.9 ml 21.92 ml/m  AORTIC VALVE LVOT Vmax:   147.00 cm/s LVOT Vmean:  118.000 cm/s LVOT VTI:    0.272 m  AORTA Ao  Root diam: 3.20 cm Ao Asc diam:  3.90 cm MITRAL VALVE MV Area (PHT): 4.63 cm     SHUNTS MV Decel Time: 164 msec     Systemic VTI:  0.27 m MV E velocity: 77.80 cm/s   Systemic Diam: 2.10 cm MV Delayne Sanzo velocity: 128.00 cm/s MV E/Yaiden Yang ratio:  0.61 Kardie Tobb DO Electronically signed by Dub Huntsman DO Signature Date/Time: 07/28/2024/3:45:44 PM    Final    DG Lumbar Spine 2-3 Views Result Date: 07/27/2024 EXAM: 2 or 3 VIEW(S) XRAY OF THE LUMBAR SPINE 07/27/2024 06:14:00 PM COMPARISON: None available. CLINICAL HISTORY: fall, pain. Patient to ED via EMS from [facility] at [place]. Pt reported weakness while walking to the bathroom and fell hitting head on door jam. Laceration to the back of patient's head, bleeding under control. Neck, head and right knee pain reported. No blood ; thinners. FINDINGS: LIMITATIONS: Examination is limited secondary to technique. LUMBAR SPINE: BONES: The bones are osteopenic. No acute fracture. No aggressive appearing osseous lesion. Alignment is normal. DISCS AND DEGENERATIVE CHANGES: There is mild disc space narrowing at L4-L5 and L5-S1. No severe degenerative changes. SOFT TISSUES: No acute abnormality. IMPRESSION: 1. No acute osseous abnormality of the lumbar spine. 2. Mild degenerative change  at L4-L5 and L5-S1. 3. Osteopenia. Electronically signed by: Greig Pique Sutton 07/27/2024 06:28 PM EDT RP Workstation: [id number]   DG Knee 2 Views Right Result Date: 07/27/2024 EXAM: 2 VIEW(S) XRAY OF THE RIGHT KNEE 07/27/2024 06:14:00 PM COMPARISON: None available. CLINICAL HISTORY: fall. Patient to ED via EMS from [place] at [place]. No blood ; thinners. FINDINGS: BONES AND JOINTS: No acute fracture. No focal osseous lesion. No joint dislocation. No significant joint effusion. There is mild degenerative narrowing of the patellofemoral joint space. SOFT TISSUES: The soft tissues are unremarkable. IMPRESSION: 1. No acute osseous abnormality. 2. Patellofemoral osteoarthrosis, mild. Electronically  signed by: Greig Pique Sutton 07/27/2024 06:27 PM EDT RP Workstation: [id number]   DG Shoulder Left Result Date: 07/27/2024 EXAM: 1 VIEW XRAY OF THE LEFT SHOULDER 07/27/2024 06:14:00 PM COMPARISON: None available. CLINICAL HISTORY: fall. Patient to ED via EMS from [place] at [place]. Pt reported weakness while walking to the bathroom and fell hitting head on door jam. Laceration to the back of patient's head, bleeding under control. Neck, head and right knee pain reported. No blood thinners. FINDINGS: BONES AND JOINTS: Glenohumeral joint is normally aligned. There is an acute transverse fracture through the proximal diaphysis of the left humerus. There is 1 shaft with anterior displacement of the distal fracture fragment with 4 cm of overlap. There is no dislocation. The [facility] joint is unremarkable in appearance. SOFT TISSUES: There is overlying soft tissue swelling. No abnormal calcifications. Visualized lung is unremarkable. IMPRESSION: 1. Displaced Acute transverse fracture through the proximal diaphysis of the left humerus 2. No dislocation Electronically signed by: Greig Pique Sutton 07/27/2024 06:25 PM EDT RP Workstation: [id number]   DG Chest Portable 1 View Result Date: 07/27/2024 EXAM: 1 VIEW(S) XRAY OF THE CHEST 07/27/2024 06:14:00 PM COMPARISON: Chest x-ray 09/17/2022. CLINICAL HISTORY: fall. Patient to ED via  EMS from [place] at [place]. Pt reported weakness while walking to the bathroom and fell hitting head on door jam. Laceration to the back of patient's head, bleeding under control. Neck, head and right knee pain reported. No blood ; thinners. FINDINGS: LUNGS AND PLEURA: Stable calcified granuloma in the right lower lobe. No pulmonary edema. No pleural effusion. No pneumothorax. HEART AND MEDIASTINUM: No acute abnormality of the cardiac and mediastinal silhouettes. BONES AND SOFT TISSUES: Proximal left humeral fracture is partially imaged. IMPRESSION: 1. No acute cardiopulmonary process. 2.  Partially imaged proximal left humeral fracture. Electronically signed by: Greig Pique Sutton 07/27/2024 06:23 PM EDT RP Workstation: [id number]   CT Cervical Spine Wo Contrast Result Date: 07/27/2024 CLINICAL DATA:  Status post fall. EXAM: CT CERVICAL SPINE WITHOUT CONTRAST TECHNIQUE: Multidetector CT imaging of the cervical spine was performed without intravenous contrast. Multiplanar CT image reconstructions were also generated. RADIATION DOSE REDUCTION: This exam was performed according to the departmental dose-optimization program which includes automated exposure control, adjustment of the mA and/or kV according to patient size and/or use of iterative reconstruction technique. COMPARISON:  None Available. FINDINGS: Alignment: There is approximately 1 mm anterolisthesis of the C2 vertebral body on C3, with 1 mm to 2 mm retrolisthesis of the C5 vertebral body on C6. Skull base and vertebrae: No acute fracture. No primary bone lesion or focal pathologic process. Soft tissues and spinal canal: No prevertebral fluid or swelling. No visible canal hematoma. Disc levels: Marked severity endplate sclerosis anterior osteophyte formation and posterior bony spurring are seen at the levels of C5-C6 and C6-C7. Mild to moderate severity similar appearing findings are seen at the level of C3-C4. There is marked severity narrowing of the anterior atlantoaxial articulation with marked severity intervertebral disc space narrowing at the levels of C5-C6 and C6-C7. Bilateral marked severity multilevel facet joint hypertrophy is noted. Upper chest: Negative. Other: Heterogeneous low-attenuation thyroid  nodules are seen within the left lobe of the thyroid  gland. IMPRESSION: 1. No acute fracture or traumatic subluxation. 2. Marked severity multilevel degenerative changes, most prominent at the levels of C5-C6 and C6-C7. 3. Low-attenuation thyroid  nodules, as described above. Correlation with nonemergent thyroid  ultrasound is  recommended. Electronically Signed   By: Suzen Dials M.D.   On: 07/27/2024 18:16   CT Head Wo Contrast Result Date: 07/27/2024 CLINICAL DATA:  Status post fall. EXAM: CT HEAD WITHOUT CONTRAST TECHNIQUE: Contiguous axial images were obtained from the base of the skull through the vertex without intravenous contrast. RADIATION DOSE REDUCTION: This exam was performed according to the departmental dose-optimization program which includes automated exposure control, adjustment of the mA and/or kV according to patient size and/or use of iterative reconstruction technique. COMPARISON:  May 17, 2023 FINDINGS: Brain: There is generalized cerebral atrophy with widening of the extra-axial spaces and ventricular dilatation. There are areas of decreased attenuation within the white matter tracts of the supratentorial brain, consistent with microvascular disease changes. Vascular: Moderate to marked severity bilateral cavernous carotid artery calcification is noted. Skull: Normal. Negative for fracture or focal lesion. Sinuses/Orbits: No acute finding. Other: Mild left occipital scalp soft tissue swelling is noted. IMPRESSION: 1. Generalized cerebral atrophy and microvascular disease changes of the supratentorial brain. 2. Mild left occipital scalp soft tissue swelling without evidence for an acute fracture or acute intracranial abnormality. Electronically Signed   By: Suzen Dials M.D.   On: 07/27/2024 18:13        Scheduled Meds:  acetaminophen   1,000 mg Oral TID   carbidopa -levodopa   1 tablet Oral 5 X Daily   enoxaparin  (LOVENOX ) injection  30 mg Subcutaneous Q24H   mirtazapine   15 mg Oral QHS   rOPINIRole   4 mg Oral QHS   rosuvastatin   5 mg Oral Daily   Continuous Infusions:   LOS: 0 days    Time spent: over 30 min     Meliton Monte, [facility]   To contact the attending provider between 7A-7P or the covering provider during after hours 7P-7A, please log into the web  site [email or web address] and access using universal  password for that web site. If you do not have the password, please call the hospital operator.  07/28/2024, 5:51 PM

## 2024-07-28 NOTE — Care Management Obs Status (Signed)
 MEDICARE OBSERVATION STATUS NOTIFICATION   Patient Details  Name: Shannon Sutton MRN: [id number] Date of Birth: 11-25-1945   Medicare Observation Status Notification Given:  Yes    Bridget Cordella Simmonds, LCSW 07/28/2024, 3:41 PM

## 2024-07-29 ENCOUNTER — Inpatient Hospital Stay (HOSPITAL_COMMUNITY)

## 2024-07-29 DIAGNOSIS — R55 Syncope and collapse: Secondary | ICD-10-CM | POA: Diagnosis not present

## 2024-07-29 LAB — CBC WITH DIFFERENTIAL/PLATELET
Abs Immature Granulocytes: 0.03 K/uL (ref 0.00–0.07)
Basophils Absolute: 0.1 K/uL (ref 0.0–0.1)
Basophils Relative: 1 %
Eosinophils Absolute: 0.4 K/uL (ref 0.0–0.5)
Eosinophils Relative: 6 %
HCT: 28.1 % — ABNORMAL LOW (ref 36.0–46.0)
Hemoglobin: 9.6 g/dL — ABNORMAL LOW (ref 12.0–15.0)
Immature Granulocytes: 1 %
Lymphocytes Relative: 40 %
Lymphs Abs: 2.3 K/uL (ref 0.7–4.0)
MCH: 33.1 pg (ref 26.0–34.0)
MCHC: 34.2 g/dL (ref 30.0–36.0)
MCV: 96.9 fL (ref 80.0–100.0)
Monocytes Absolute: 0.6 K/uL (ref 0.1–1.0)
Monocytes Relative: 11 %
Neutro Abs: 2.5 K/uL (ref 1.7–7.7)
Neutrophils Relative %: 41 %
Platelets: 217 K/uL (ref 150–400)
RBC: 2.9 MIL/uL — ABNORMAL LOW (ref 3.87–5.11)
RDW: 13.6 % (ref 11.5–15.5)
WBC: 5.8 K/uL (ref 4.0–10.5)
nRBC: 0.3 % — ABNORMAL HIGH (ref 0.0–0.2)

## 2024-07-29 LAB — COMPREHENSIVE METABOLIC PANEL WITH GFR
ALT: 10 U/L (ref 0–44)
AST: 22 U/L (ref 15–41)
Albumin: 3.1 g/dL — ABNORMAL LOW (ref 3.5–5.0)
Alkaline Phosphatase: 69 U/L (ref 38–126)
Anion gap: 11 (ref 5–15)
BUN: <5 mg/dL — ABNORMAL LOW (ref 8–23)
CO2: 21 mmol/L — ABNORMAL LOW (ref 22–32)
Calcium: 8.2 mg/dL — ABNORMAL LOW (ref 8.9–10.3)
Chloride: 105 mmol/L (ref 98–111)
Creatinine, Ser: 0.49 mg/dL (ref 0.44–1.00)
GFR, Estimated: >60 mL/min (ref >60)
Glucose, Bld: 77 mg/dL (ref 70–99)
Potassium: 3.8 mmol/L (ref 3.5–5.1)
Sodium: 137 mmol/L (ref 135–145)
Total Bilirubin: 0.9 mg/dL (ref 0.0–1.2)
Total Protein: 5.1 g/dL — ABNORMAL LOW (ref 6.5–8.1)

## 2024-07-29 LAB — RETICULOCYTES
Immature Retic Fract: 17.1 % — ABNORMAL HIGH (ref 2.3–15.9)
RBC.: 2.86 MIL/uL — ABNORMAL LOW (ref 3.87–5.11)
Retic Count, Absolute: 60.1 K/uL (ref 19.0–186.0)
Retic Ct Pct: 2.1 % (ref 0.4–3.1)

## 2024-07-29 LAB — FOLATE: Folate: 19.8 ng/mL (ref >5.9)

## 2024-07-29 LAB — MAGNESIUM: Magnesium: 2 mg/dL (ref 1.7–2.4)

## 2024-07-29 LAB — PHOSPHORUS: Phosphorus: 3.3 mg/dL (ref 2.5–4.6)

## 2024-07-29 MED ORDER — KETOROLAC TROMETHAMINE 15 MG/ML IJ SOLN
15.0000 mg | Freq: Once | INTRAMUSCULAR | Status: AC
  Administered 2024-07-29: 15 mg via INTRAVENOUS
  Filled 2024-07-29: qty 1

## 2024-07-29 MED ORDER — CHOLECALCIFEROL 10 MCG (400 UNIT) PO TABS
400.0000 [IU] | ORAL_TABLET | Freq: Every day | ORAL | Status: DC
  Administered 2024-07-30 – 2024-08-05 (×7): 400 [IU] via ORAL
  Filled 2024-07-29 (×7): qty 1

## 2024-07-29 MED ORDER — IOHEXOL 350 MG/ML SOLN
75.0000 mL | Freq: Once | INTRAVENOUS | Status: AC | PRN
  Administered 2024-07-29: 75 mL via INTRAVENOUS

## 2024-07-29 MED ORDER — LACTATED RINGERS IV BOLUS
500.0000 mL | Freq: Once | INTRAVENOUS | Status: AC
  Administered 2024-07-29: 500 mL via INTRAVENOUS

## 2024-07-29 MED ORDER — FENTANYL CITRATE (PF) 50 MCG/ML IJ SOSY
25.0000 ug | PREFILLED_SYRINGE | Freq: Once | INTRAMUSCULAR | Status: AC
  Administered 2024-07-30: 25 ug via INTRAVENOUS
  Filled 2024-07-29: qty 1

## 2024-07-29 MED ORDER — POLYETHYLENE GLYCOL 3350 17 G PO PACK
17.0000 g | PACK | Freq: Two times a day (BID) | ORAL | Status: DC
  Administered 2024-07-29 – 2024-08-04 (×12): 17 g via ORAL
  Filled 2024-07-29 (×12): qty 1

## 2024-07-29 NOTE — Plan of Care (Signed)

## 2024-07-29 NOTE — Plan of Care (Signed)
   Problem: Education: Goal: Knowledge of General Education information will improve Description: Including pain rating scale, medication(s)/side effects and non-pharmacologic comfort measures Outcome: Progressing   Problem: Activity: Goal: Risk for activity intolerance will decrease Outcome: Progressing   Problem: Nutrition: Goal: Adequate nutrition will be maintained Outcome: Progressing   Problem: Coping: Goal: Level of anxiety will decrease Outcome: Progressing

## 2024-07-29 NOTE — Progress Notes (Addendum)
 PROGRESS NOTE    Shannon Sutton  FMW:[id number] DOB: 05-Nov-1945 DOA: 07/27/2024 PCP: Theophilus Andrews, Tully GRADE, MD  Chief Complaint  Patient presents with   Fall   Weakness    Brief Narrative:   Shannon Sutton is Shannon Sutton 78 y.o. female with medical history significant for Parkinson's disease, anxiety and depression, fibromyalgia, HTN, HLD, restless leg syndrome, urinary incontinence and arthritis who presented to [facility] for evaluation of fall and weakness and admitted for further evaluation.   Assessment & Plan:   Principal Problem:   Fall Active Problems:   Near syncope   Symptomatic anemia   Closed fracture of proximal end of left humerus with routine healing   Parkinson's disease (HCC)   Hypokalemia   Generalized weakness  # Fall # Generalized weakness # Orthostatic Hypotension  - increased falls in the past few weeks.  In setting of her parkinson's disease.  Also, recent increase in dose of her sinemet  (from 1 pill 5 times Shannon Sutton day to 1.5 pills 4 times daily and 1 pill nightly - note this was reported to me by family, but recent dose increase I see documented is from 04/05/2024 - daughters called this attempt Shannon Sutton trial).  She's orthostatic, which I suspect is related to parkinsons +/- sinemet .  Also with new anemia, but I doubt this is playing Shannon Sutton major role given stability and mild anemia with hb in 9's. - echo with preserved EF, no AS - Start IV NS 100 cc/h for 10 hours - TSH wnl - PT/OT eval and treat - Fall precautions - compression stockings, change positions slowly, elevated HOB - given increasing falls after increasing sinemet  dose, will resume dose from Shannon Sutton few weeks ago (if possible, will reach out to her neurologist - phone call out to neurology clinic)  # Sinus Tachycardia - follow with bolus  - EKG with sinus tach - TSH wnl  - consider CT PE protocol if not improving  # Symptomatic anemia - Hgb of 8.6 on admission from baseline of 12-13 - stable today around  9.6 - no clear indication for transfusion at this point - labs consistent with low normal iron.  Normal b12.  Follow folate.  - follow retic (inadequate) - warrants additional outpatient follow up for her new anemia   # Hypokalemia - replace and follow  # Left humerus fracture - Patient had follow-up with orthopedic in the outpatient today - Repeat x-ray shows displaced Acute transverse fracture through the proximal diaphysis of the left humerus - Continue left shoulder sling - Pain control with scheduled Tylenol  and as needed low-dose Oxy - dose of toradol - Follow-up with orthopedic surgery in the outpatient - plan from 10/15 notes sling immobilization full time for next 10 days, follow up for x rays   # Scalp Laceration - stapled, will need to come out 7-10 days (10/23-26)  # Degenerative disc disease - Back imaging shows degenerative changes in the L-spine and C-spine with signs of osteopenia - Pain control as above   # Parkinson's disease - Patient on carbidopa  levodopa  however reports she is taking this 5 times Shannon Sutton day and she starts feeling weak when it wears off   # Restless leg syndrome - Continue Requip  - Follow-up ferritin levels   # Anxiety and depression - Continue mirtazapine    # Hyperlipidemia - Continue rosuvastatin     DVT prophylaxis: lovenox  Code Status: full Family Communication: 2 daughters at bedside Disposition:   Status is: Observation The patient will require  care spanning > 2 midnights and should be moved to inpatient because: need for continued inpatient care   Consultants:    Procedures:  Echo IMPRESSIONS     1. Left ventricular ejection fraction, by estimation, is 60 to 65%. The  left ventricle has normal function. The left ventricle has no regional  wall motion abnormalities. Left ventricular diastolic parameters were  normal.   2. Right ventricular systolic function is normal. The right ventricular  size is normal. Tricuspid  regurgitation signal is inadequate for assessing  PA pressure.   3. The mitral valve is normal in structure. Mild mitral valve  regurgitation. No evidence of mitral stenosis.   4. Tricuspid valve regurgitation is mild to moderate.   5. The aortic valve is normal in structure. Aortic valve regurgitation is  mild. No aortic stenosis is present.   6. There is mild dilatation of the ascending aorta, measuring 39 mm.   7. The inferior vena cava is normal in size with greater than 50%  respiratory variability, suggesting right atrial pressure of 3 mmHg.   Antimicrobials:  Anti-infectives (From admission, onward)    None       Subjective: C/o pain in arm 6/10  Objective: Vitals:   07/29/24 0834 07/29/24 1030 07/29/24 1235 07/29/24 1300  BP: (!) 148/75 134/70 137/64   Pulse: (!) 118 (!) 116 (!) 114   Resp: 16  16 18   Temp: 98.1 F (36.7 C) 98.2 F (36.8 C) 98.2 F (36.8 C)   TempSrc: Oral Oral Oral   SpO2: 100%  99%   Weight:    53.9 kg  Height:    5' 0.98 (1.549 m)    Intake/Output Summary (Last 24 hours) at 07/29/2024 1312 Last data filed at 07/28/2024 1500 Gross per 24 hour  Intake 568.14 ml  Output 300 ml  Net 268.14 ml   Filed Weights   07/29/24 1300  Weight: 53.9 kg    Examination:  General: No acute distress. Cardiovascular: tachy, regular Lungs: Clear to auscultation bilaterally Abdomen: Soft, nontender, nondistended  Neurological: Alert and oriented 3. Moves all extremities 4 with equal strength. Cranial nerves II through XII grossly intact. Extremities: LUE sling, no LEE     Data Reviewed: I have personally reviewed following labs and imaging studies  CBC: Recent Labs  Lab 07/27/24 1750 07/28/24 0459 07/28/24 1416 07/29/24 0449  WBC 6.6 6.4  --  5.8  NEUTROABS  --   --   --  2.5  HGB 8.6* 9.7* 9.6* 9.6*  HCT 24.9* 28.2* 28.3* 28.1*  MCV 94.3 95.3  --  96.9  PLT 183 202  --  217    Basic Metabolic Panel: Recent Labs  Lab  07/27/24 1750 07/28/24 0459 07/29/24 0449  NA 133* 138 137  K 3.2* 3.4* 3.8  CL 97* 104 105  CO2 23 21* 21*  GLUCOSE 105* 71 77  BUN 8 <5* <5*  CREATININE 0.51 0.53 0.49  CALCIUM  9.0 8.4* 8.2*  MG  --  2.0 2.0  PHOS  --  3.6 3.3    GFR: Estimated Creatinine Clearance: 42.3 mL/min (by C-G formula based on SCr of 0.49 mg/dL).  Liver Function Tests: Recent Labs  Lab 07/27/24 1750 07/29/24 0449  AST 26 22  ALT <5 10  ALKPHOS 77 69  BILITOT 0.5 0.9  PROT 5.5* 5.1*  ALBUMIN 3.8 3.1*    CBG: No results for input(s): GLUCAP in the last 168 hours.   No results found for this or  any previous visit (from the past 240 hours).       Radiology Studies: ECHOCARDIOGRAM COMPLETE Result Date: 07/28/2024    ECHOCARDIOGRAM REPORT   Patient Name:   AURIEL KIST Date of Exam: 07/28/2024 Medical Rec #:  [id number]      Height:       61.0 in Accession #:    [id number]     Weight:       124.9 lb Date of Birth:  Oct 11, 1946      BSA:          1.546 m Patient Age:    78 years       BP:           105/71 mmHg Patient Gender: F              HR:           106 bpm. Exam Location:  Inpatient Procedure: 2D Echo, Color Doppler and Cardiac Doppler (Both Spectral and Color            Flow Doppler were utilized during procedure). Indications:    Syncope R55  History:        Patient has no prior history of Echocardiogram examinations.  Sonographer:    Tinnie Gosling RDCS Referring Phys: [phone] Ashonte Angelucci CALDWELL POWELL JR IMPRESSIONS  1. Left ventricular ejection fraction, by estimation, is 60 to 65%. The left ventricle has normal function. The left ventricle has no regional wall motion abnormalities. Left ventricular diastolic parameters were normal.  2. Right ventricular systolic function is normal. The right ventricular size is normal. Tricuspid regurgitation signal is inadequate for assessing PA pressure.  3. The mitral valve is normal in structure. Mild mitral valve regurgitation. No evidence of mitral stenosis.   4. Tricuspid valve regurgitation is mild to moderate.  5. The aortic valve is normal in structure. Aortic valve regurgitation is mild. No aortic stenosis is present.  6. There is mild dilatation of the ascending aorta, measuring 39 mm.  7. The inferior vena cava is normal in size with greater than 50% respiratory variability, suggesting right atrial pressure of 3 mmHg. FINDINGS  Left Ventricle: Left ventricular ejection fraction, by estimation, is 60 to 65%. The left ventricle has normal function. The left ventricle has no regional wall motion abnormalities. The left ventricular internal cavity size was normal in size. There is  no left ventricular hypertrophy. Left ventricular diastolic parameters were normal. Right Ventricle: The right ventricular size is normal. No increase in right ventricular wall thickness. Right ventricular systolic function is normal. Tricuspid regurgitation signal is inadequate for assessing PA pressure. Left Atrium: Left atrial size was normal in size. Right Atrium: Right atrial size was normal in size. Pericardium: There is no evidence of pericardial effusion. Mitral Valve: The mitral valve is normal in structure. Mild mitral valve regurgitation. No evidence of mitral valve stenosis. Tricuspid Valve: The tricuspid valve is normal in structure. Tricuspid valve regurgitation is mild to moderate. No evidence of tricuspid stenosis. Aortic Valve: The aortic valve is normal in structure. Aortic valve regurgitation is mild. No aortic stenosis is present. Pulmonic Valve: The pulmonic valve was normal in structure. Pulmonic valve regurgitation is not visualized. No evidence of pulmonic stenosis. Aorta: There is mild dilatation of the ascending aorta, measuring 39 mm. Venous: The inferior vena cava is normal in size with greater than 50% respiratory variability, suggesting right atrial pressure of 3 mmHg. IAS/Shunts: No atrial level shunt detected by color flow Doppler.  LEFT VENTRICLE PLAX  2D  LVIDd:         3.30 cm   Diastology LVIDs:         1.30 cm   LV e' medial:    6.31 cm/s LV PW:         0.80 cm   LV E/e' medial:  12.3 LV IVS:        0.80 cm   LV e' lateral:   8.27 cm/s LVOT diam:     2.10 cm   LV E/e' lateral: 9.4 LV SV:         94 LV SV Index:   61 LVOT Area:     3.46 cm  RIGHT VENTRICLE RV S prime:     19.20 cm/s TAPSE (M-mode): 1.8 cm LEFT ATRIUM           Index        RIGHT ATRIUM          Index LA diam:      2.40 cm 1.55 cm/m   RA Area:     9.51 cm LA Vol (A2C): 9.6 ml  6.18 ml/m   RA Volume:   16.50 ml 10.67 ml/m LA Vol (A4C): 33.9 ml 21.92 ml/m  AORTIC VALVE LVOT Vmax:   147.00 cm/s LVOT Vmean:  118.000 cm/s LVOT VTI:    0.272 m  AORTA Ao Root diam: 3.20 cm Ao Asc diam:  3.90 cm MITRAL VALVE MV Area (PHT): 4.63 cm     SHUNTS MV Decel Time: 164 msec     Systemic VTI:  0.27 m MV E velocity: 77.80 cm/s   Systemic Diam: 2.10 cm MV Zenia Guest velocity: 128.00 cm/s MV E/Lemar Bakos ratio:  0.61 Kardie Tobb DO Electronically signed by Dub Huntsman DO Signature Date/Time: 07/28/2024/3:45:44 PM    Final    DG Lumbar Spine 2-3 Views Result Date: 07/27/2024 EXAM: 2 or 3 VIEW(S) XRAY OF THE LUMBAR SPINE 07/27/2024 06:14:00 PM COMPARISON: None available. CLINICAL HISTORY: fall, pain. Patient to ED via EMS from [place] at [place]. Pt reported weakness while walking to the bathroom and fell hitting head on door jam. Laceration to the back of patient's head, bleeding under control. Neck, head and right knee pain reported. No blood ; thinners. FINDINGS: LIMITATIONS: Examination is limited secondary to technique. LUMBAR SPINE: BONES: The bones are osteopenic. No acute fracture. No aggressive appearing osseous lesion. Alignment is normal. DISCS AND DEGENERATIVE CHANGES: There is mild disc space narrowing at L4-L5 and L5-S1. No severe degenerative changes. SOFT TISSUES: No acute abnormality. IMPRESSION: 1. No acute osseous abnormality of the lumbar spine. 2. Mild degenerative change  at L4-L5 and L5-S1. 3.  Osteopenia. Electronically signed by: Greig Pique MD 07/27/2024 06:28 PM EDT RP Workstation: [id number]   DG Knee 2 Views Right Result Date: 07/27/2024 EXAM: 2 VIEW(S) XRAY OF THE RIGHT KNEE 07/27/2024 06:14:00 PM COMPARISON: None available. CLINICAL HISTORY: fall. Patient to ED via EMS from [facility] at [place]. No blood ; thinners. FINDINGS: BONES AND JOINTS: No acute fracture. No focal osseous lesion. No joint dislocation. No significant joint effusion. There is mild degenerative narrowing of the patellofemoral joint space. SOFT TISSUES: The soft tissues are unremarkable. IMPRESSION: 1. No acute osseous abnormality. 2. Patellofemoral osteoarthrosis, mild. Electronically signed by: Greig Pique MD 07/27/2024 06:27 PM EDT RP Workstation: [id number]   DG Shoulder Left Result Date: 07/27/2024 EXAM: 1 VIEW XRAY OF THE LEFT SHOULDER 07/27/2024 06:14:00 PM COMPARISON: None available. CLINICAL HISTORY: fall. Patient to ED via EMS from [facility] at [place]. Pt  reported weakness while walking to the bathroom and fell hitting head on door jam. Laceration to the back of patient's head, bleeding under control. Neck, head and right knee pain reported. No blood thinners. FINDINGS: BONES AND JOINTS: Glenohumeral joint is normally aligned. There is an acute transverse fracture through the proximal diaphysis of the left humerus. There is 1 shaft with anterior displacement of the distal fracture fragment with 4 cm of overlap. There is no dislocation. The [facility] joint is unremarkable in appearance. SOFT TISSUES: There is overlying soft tissue swelling. No abnormal calcifications. Visualized lung is unremarkable. IMPRESSION: 1. Displaced Acute transverse fracture through the proximal diaphysis of the left humerus 2. No dislocation Electronically signed by: Greig Pique MD 07/27/2024 06:25 PM EDT RP Workstation: [id number]   DG Chest Portable 1 View Result Date: 07/27/2024 EXAM: 1 VIEW(S) XRAY OF THE CHEST 07/27/2024  06:14:00 PM COMPARISON: Chest x-ray 09/17/2022. CLINICAL HISTORY: fall. Patient to ED via EMS from [facility] at [place]. Pt reported weakness while walking to the bathroom and fell hitting head on door jam. Laceration to the back of patient's head, bleeding under control. Neck, head and right knee pain reported. No blood ; thinners. FINDINGS: LUNGS AND PLEURA: Stable calcified granuloma in the right lower lobe. No pulmonary edema. No pleural effusion. No pneumothorax. HEART AND MEDIASTINUM: No acute abnormality of the cardiac and mediastinal silhouettes. BONES AND SOFT TISSUES: Proximal left humeral fracture is partially imaged. IMPRESSION: 1. No acute cardiopulmonary process. 2. Partially imaged proximal left humeral fracture. Electronically signed by: Greig Pique MD 07/27/2024 06:23 PM EDT RP Workstation: [id number]   CT Cervical Spine Wo Contrast Result Date: 07/27/2024 CLINICAL DATA:  Status post fall. EXAM: CT CERVICAL SPINE WITHOUT CONTRAST TECHNIQUE: Multidetector CT imaging of the cervical spine was performed without intravenous contrast. Multiplanar CT image reconstructions were also generated. RADIATION DOSE REDUCTION: This exam was performed according to the departmental dose-optimization program which includes automated exposure control, adjustment of the mA and/or kV according to patient size and/or use of iterative reconstruction technique. COMPARISON:  None Available. FINDINGS: Alignment: There is approximately 1 mm anterolisthesis of the C2 vertebral body on C3, with 1 mm to 2 mm retrolisthesis of the C5 vertebral body on C6. Skull base and vertebrae: No acute fracture. No primary bone lesion or focal pathologic process. Soft tissues and spinal canal: No prevertebral fluid or swelling. No visible canal hematoma. Disc levels: Marked severity endplate sclerosis anterior osteophyte formation and posterior bony spurring are seen at the levels of C5-C6 and C6-C7. Mild to moderate severity similar  appearing findings are seen at the level of C3-C4. There is marked severity narrowing of the anterior atlantoaxial articulation with marked severity intervertebral disc space narrowing at the levels of C5-C6 and C6-C7. Bilateral marked severity multilevel facet joint hypertrophy is noted. Upper chest: Negative. Other: Heterogeneous low-attenuation thyroid  nodules are seen within the left lobe of the thyroid  gland. IMPRESSION: 1. No acute fracture or traumatic subluxation. 2. Marked severity multilevel degenerative changes, most prominent at the levels of C5-C6 and C6-C7. 3. Low-attenuation thyroid  nodules, as described above. Correlation with nonemergent thyroid  ultrasound is recommended. Electronically Signed   By: Suzen Dials M.D.   On: 07/27/2024 18:16   CT Head Wo Contrast Result Date: 07/27/2024 CLINICAL DATA:  Status post fall. EXAM: CT HEAD WITHOUT CONTRAST TECHNIQUE: Contiguous axial images were obtained from the base of the skull through the vertex without intravenous contrast. RADIATION DOSE REDUCTION: This exam was performed according to the departmental dose-optimization program  which includes automated exposure control, adjustment of the mA and/or kV according to patient size and/or use of iterative reconstruction technique. COMPARISON:  May 17, 2023 FINDINGS: Brain: There is generalized cerebral atrophy with widening of the extra-axial spaces and ventricular dilatation. There are areas of decreased attenuation within the white matter tracts of the supratentorial brain, consistent with microvascular disease changes. Vascular: Moderate to marked severity bilateral cavernous carotid artery calcification is noted. Skull: Normal. Negative for fracture or focal lesion. Sinuses/Orbits: No acute finding. Other: Mild left occipital scalp soft tissue swelling is noted. IMPRESSION: 1. Generalized cerebral atrophy and microvascular disease changes of the supratentorial brain. 2. Mild left occipital  scalp soft tissue swelling without evidence for an acute fracture or acute intracranial abnormality. Electronically Signed   By: Suzen Dials M.D.   On: 07/27/2024 18:13        Scheduled Meds:  acetaminophen   1,000 mg Oral TID   carbidopa -levodopa   1 tablet Oral 5 X Daily   enoxaparin  (LOVENOX ) injection  30 mg Subcutaneous Q24H   mirtazapine   15 mg Oral QHS   polyethylene glycol  17 g Oral BID   rOPINIRole   4 mg Oral QHS   rosuvastatin   5 mg Oral Daily   Continuous Infusions:   LOS: 1 day    Time spent: over 30 min     Meliton Monte, MD [facility]   To contact the attending provider between 7A-7P or the covering provider during after hours 7P-7A, please log into the web site [email or web address] and access using universal [place] password for that web site. If you do not have the password, please call the hospital operator.  07/29/2024, 1:12 PM

## 2024-07-29 NOTE — Progress Notes (Signed)
 Initial Nutrition Assessment  DOCUMENTATION CODES:   Not applicable  INTERVENTION:  Regular diet  Encourage PO intake  400 units Vit D daily for low levels on 10/16    NUTRITION DIAGNOSIS:   Altered nutrition lab value related to  (vitamin deficiency) as evidenced by  (25-Hydroxyvitamin D (25-OHD) < 30).   GOAL:   Patient will meet greater than or equal to 90% of their needs   MONITOR:   PO intake, Labs  REASON FOR ASSESSMENT:   Consult Assessment of nutrition requirement/status  ASSESSMENT:   medical history significant for Parkinson's disease, anxiety and depression, fibromyalgia, HTN, HLD, restless leg syndrome, urinary incontinence and arthritis who presented to [facility] for evaluation of fall and weakness.  Patient seen in room, eating lunch. She reports that she does not have a large appetite at baseline, will normally eat two meals per day with a few snacks. Normal BW of 115 lbs, bedscale weight (118 lbs) taken during visit and updated in chart. Patient has tried ensure in the past and declined to have the at this time, RD let pt know they are available on the unit if she changes her mind. Low Vitamin D  levels on 10/16, can have low maintenance dose ordered.   Meds: mirtazapine , miralax    Labs: BUN <5, Calcium  8.2, Vit D 21.36  NUTRITION - FOCUSED PHYSICAL EXAM:  Flowsheet Row Most Recent Value  Orbital Region No depletion  Upper Arm Region No depletion  Thoracic and Lumbar Region No depletion  Buccal Region No depletion  Temple Region No depletion  Clavicle Bone Region Mild depletion  Clavicle and Acromion Bone Region Mild depletion  Scapular Bone Region No depletion  Dorsal Hand Mild depletion  Patellar Region No depletion  Anterior Thigh Region No depletion  Posterior Calf Region No depletion  Hair Reviewed  Eyes Reviewed  Mouth Reviewed  Skin Reviewed  Nails Reviewed    Diet Order:   Diet Order             Diet regular Room service  appropriate? Yes; Fluid consistency: Thin  Diet effective now                   EDUCATION NEEDS:   No education needs have been identified at this time  Skin:  Skin Assessment: Reviewed RN Assessment  Last BM:  PTA  Height:   Ht Readings from Last 1 Encounters:  07/29/24 5' 0.98 (1.549 m)    Weight:   Wt Readings from Last 1 Encounters:  07/29/24 53.9 kg   BMI:  Body mass index is 22.46 kg/m.  Estimated Nutritional Needs:   Kcal:  1350-1600 kcal  Protein:  55-65 grams  Fluid:  1.3-1.6L/d  Madalyn Potters, MS, RD, LDN Clinical Dietitian  Contact via secure chat. If unavailable, use group chat RD Inpatient.

## 2024-07-29 NOTE — TOC Progression Note (Addendum)
 Transition of Care [facility]) - Progression Note    Patient Details  Name: Shannon Sutton MRN: [id number] Date of Birth: 1945-11-30  [facility] CM/SW Contact  Bridget Cordella Simmonds, LCSW Phone Number: 07/29/2024, 10:03 AM  Clinical Narrative:   Messages from Mellisa/RN at [facility]. [phone].  They can take pt back without SNF stay.  Need updated med list, HHPT order.  Please fax these to [phone].  MD notified.    1030: CSW updated pt [name] with the above information.  She is in agreement with plan for DC back to [facility] with PT there.        Barriers to Discharge: Other (must enter comment) (Return to [facility] vs SNF)               Expected Discharge Plan and Services In-house Referral: Clinical Social Work     Living arrangements for the past 2 months: Assisted Living Facility ([facility])                                       Social Drivers of Health (SDOH) Interventions SDOH Screenings   Food Insecurity: No Food Insecurity (07/28/2024)  Housing: Low Risk  (07/28/2024)  Transportation Needs: No Transportation Needs (07/28/2024)  Utilities: Not At Risk (07/28/2024)  Alcohol Screen: Low Risk  (11/10/2021)  Depression (PHQ2-9): High Risk (04/28/2024)  Financial Resource Strain: Low Risk  (04/27/2024)  Physical Activity: Inactive (04/27/2024)  Social Connections: Unknown (07/28/2024)  Stress: Stress Concern Present (04/27/2024)  Tobacco Use: Medium Risk (07/28/2024)    Readmission Risk Interventions     No data to display

## 2024-07-29 NOTE — Progress Notes (Signed)
   07/29/24 0834  Assess: MEWS Score  Temp 98.1 F (36.7 C)  BP (!) 148/75  MAP (mmHg) 95  Pulse Rate (!) 118  ECG Heart Rate (!) 118  Resp 16  SpO2 100 %  O2 Device Room Air  Assess: MEWS Score  MEWS Temp 0  MEWS Systolic 0  MEWS Pulse 2  MEWS RR 0  MEWS LOC 0  MEWS Score 2  MEWS Score Color Yellow  Assess: if the MEWS score is Yellow or Red  Were vital signs accurate and taken at a resting state? Yes  Does the patient meet 2 or more of the SIRS criteria? No  MEWS guidelines implemented  Yes, yellow  Treat  MEWS Interventions Considered administering scheduled or prn medications/treatments as ordered  Take Vital Signs  Increase Vital Sign Frequency  Yellow: Q2hr x1, continue Q4hrs until patient remains green for 12hrs  Escalate  MEWS: Escalate Yellow: Discuss with charge nurse and consider notifying provider and/or RRT  Notify: Charge Nurse/RN  Name of Charge Nurse/RN Notified Artist Patrcia PEAK  Provider Notification  Provider Name/Title Meliton Perri COME  Date Provider Notified 07/29/24  Time Provider Notified 0900  Method of Notification Face-to-face  Notification Reason Other (Comment)  Provider response See new orders  Date of Provider Response 07/29/24  Time of Provider Response 0930  Assess: SIRS CRITERIA  SIRS Temperature  0  SIRS Respirations  0  SIRS Pulse 1  SIRS WBC 0  SIRS Score Sum  1   Pt seen in room alert/oriented in no apparent distress, NO complaints. Denies any chest pain at this time. Attending MD aware of above,

## 2024-07-30 ENCOUNTER — Inpatient Hospital Stay (HOSPITAL_COMMUNITY)

## 2024-07-30 DIAGNOSIS — R296 Repeated falls: Secondary | ICD-10-CM

## 2024-07-30 DIAGNOSIS — I951 Orthostatic hypotension: Secondary | ICD-10-CM

## 2024-07-30 DIAGNOSIS — R55 Syncope and collapse: Secondary | ICD-10-CM | POA: Diagnosis not present

## 2024-07-30 DIAGNOSIS — F0280 Dementia in other diseases classified elsewhere without behavioral disturbance: Secondary | ICD-10-CM

## 2024-07-30 LAB — BASIC METABOLIC PANEL WITH GFR
Anion gap: 15 (ref 5–15)
BUN: 5 mg/dL — ABNORMAL LOW (ref 8–23)
CO2: 20 mmol/L — ABNORMAL LOW (ref 22–32)
Calcium: 8.6 mg/dL — ABNORMAL LOW (ref 8.9–10.3)
Chloride: 103 mmol/L (ref 98–111)
Creatinine, Ser: 0.49 mg/dL (ref 0.44–1.00)
GFR, Estimated: >60 mL/min (ref >60)
Glucose, Bld: 77 mg/dL (ref 70–99)
Potassium: 3.5 mmol/L (ref 3.5–5.1)
Sodium: 138 mmol/L (ref 135–145)

## 2024-07-30 LAB — CBC
HCT: 28.3 % — ABNORMAL LOW (ref 36.0–46.0)
Hemoglobin: 9.5 g/dL — ABNORMAL LOW (ref 12.0–15.0)
MCH: 32.5 pg (ref 26.0–34.0)
MCHC: 33.6 g/dL (ref 30.0–36.0)
MCV: 96.9 fL (ref 80.0–100.0)
Platelets: 243 K/uL (ref 150–400)
RBC: 2.92 MIL/uL — ABNORMAL LOW (ref 3.87–5.11)
RDW: 13.5 % (ref 11.5–15.5)
WBC: 6.1 K/uL (ref 4.0–10.5)
nRBC: 0 % (ref 0.0–0.2)

## 2024-07-30 LAB — PHOSPHORUS: Phosphorus: 3.6 mg/dL (ref 2.5–4.6)

## 2024-07-30 LAB — MAGNESIUM: Magnesium: 1.9 mg/dL (ref 1.7–2.4)

## 2024-07-30 MED ORDER — FENTANYL CITRATE (PF) 50 MCG/ML IJ SOSY
25.0000 ug | PREFILLED_SYRINGE | Freq: Once | INTRAMUSCULAR | Status: AC
  Administered 2024-07-30: 25 ug via INTRAVENOUS
  Filled 2024-07-30: qty 1

## 2024-07-30 MED ORDER — OXYCODONE HCL 5 MG PO TABS
2.5000 mg | ORAL_TABLET | Freq: Once | ORAL | Status: AC | PRN
  Administered 2024-07-30: 2.5 mg via ORAL
  Filled 2024-07-30: qty 1

## 2024-07-30 MED ORDER — OXYCODONE HCL 5 MG PO TABS
2.5000 mg | ORAL_TABLET | Freq: Four times a day (QID) | ORAL | Status: DC | PRN
  Administered 2024-07-30 – 2024-08-02 (×8): 5 mg via ORAL
  Filled 2024-07-30 (×8): qty 1

## 2024-07-30 MED ORDER — POTASSIUM CHLORIDE CRYS ER 20 MEQ PO TBCR
40.0000 meq | EXTENDED_RELEASE_TABLET | Freq: Once | ORAL | Status: AC
  Administered 2024-07-30: 40 meq via ORAL
  Filled 2024-07-30: qty 2

## 2024-07-30 MED ORDER — LACTATED RINGERS IV BOLUS
1000.0000 mL | Freq: Once | INTRAVENOUS | Status: AC
  Administered 2024-07-30: 1000 mL via INTRAVENOUS

## 2024-07-30 NOTE — Plan of Care (Signed)
  Problem: Education: Goal: Knowledge of General Education information will improve Description: Including pain rating scale, medication(s)/side effects and non-pharmacologic comfort measures Outcome: Progressing   Problem: Health Behavior/Discharge Planning: Goal: Ability to manage health-related needs will improve Outcome: Progressing   Problem: Clinical Measurements: Goal: Ability to maintain clinical measurements within normal limits will improve Outcome: Progressing   Problem: Activity: Goal: Risk for activity intolerance will decrease Outcome: Progressing   Problem: Nutrition: Goal: Adequate nutrition will be maintained Outcome: Progressing   Problem: Pain Managment: Goal: General experience of comfort will improve and/or be controlled Outcome: Progressing   Problem: Safety: Goal: Ability to remain free from injury will improve Outcome: Progressing

## 2024-07-30 NOTE — Progress Notes (Signed)
 PROGRESS NOTE    [name]  FMW:[id number] DOB: 1945/11/24 DOA: 07/27/2024 PCP: Theophilus Andrews, Tully GRADE, MD  Chief Complaint  Patient presents with   Fall   Weakness    Brief Narrative:   Shannon Sutton is Shannon Sutton 78 y.o. female with medical history significant for Parkinson's disease, anxiety and depression, fibromyalgia, HTN, HLD, restless leg syndrome, urinary incontinence and arthritis who presented to [facility] for evaluation of fall and weakness and admitted for further evaluation.   Assessment & Plan:   Principal Problem:   Fall Active Problems:   Near syncope   Symptomatic anemia   Closed fracture of proximal end of left humerus with routine healing   Parkinson's disease (HCC)   Hypokalemia   Generalized weakness  # Fall # Generalized weakness # Orthostatic Hypotension  # Parkinson's Disease - increased falls in the past few weeks.  In setting of her parkinson's disease.  Also, recent increase in dose of her sinemet  (from 1 pill 5 times Lilja Soland day to 1.5 pills 4 times daily and 1 pill nightly - daughter showed me Zia Kanner mychart message where they decided to trial this with her outpatient neurologist).  She's orthostatic, which I suspect is related to parkinsons +/- sinemet .  Also with new anemia, but I doubt this is playing Yaslin Kirtley major role given stability and mild anemia with hb in 9's. - echo with preserved EF, no AS - persistent orthostasis, bolus again - compression stockings, abdominal binder, change positions slowly, elevated HOB - TSH wnl - follow cortisol  - PT/OT eval and treat - Fall precautions - given increasing falls after increasing sinemet  dose, will resume dose from [facility] few weeks ago (25-100 mg 5 times daily) - unfortunately, she's been refusing her sinemet  with concern it's making her symptoms worse - worsening orthostasis today as well as concern it's worsening anxiety  - will ask neurology to weigh in on her symptoms today  # Sinus Tachycardia - follow  with bolus  - EKG with sinus tach - TSH wnl  - CT PE protocol without PE - will monitor for now   # Normocytic Anemia - Hgb of 8.6 on admission from baseline of 12-13 - stable today around 9.5 - no clear indication for transfusion at this point - labs consistent with low normal iron.  Normal b12. Folate wnl. - follow retic (inadequate) - warrants additional outpatient follow up for her new anemia   # Hypokalemia - replace and follow  # Left humerus fracture - Patient had follow-up with orthopedic in the outpatient on day of admission - Repeat x-ray shows displaced Acute transverse fracture through the proximal diaphysis of the left humerus - Continue left shoulder sling - Pain control with scheduled Tylenol  and as needed low-dose Oxy - dose of toradol - Follow-up with orthopedic surgery in the outpatient - plan from 10/15 notes sling immobilization full time for next 10 days, follow up for x rays - (will repeat imaging today with worsening pain)   # Scalp Laceration - stapled, will need to come out 7-10 days (10/23-26)  # Degenerative disc disease - Back imaging shows degenerative changes in the L-spine and C-spine with signs of osteopenia - Pain control as above  # Restless leg syndrome - Continue Requip  - Follow-up ferritin levels   # Anxiety and depression - Continue mirtazapine    # Hyperlipidemia - Continue rosuvastatin     DVT prophylaxis: lovenox  Code Status: full Family Communication: 2 daughters at bedside Disposition:   Status  is: Observation The patient will require care spanning > 2 midnights and should be moved to inpatient because: need for continued inpatient care   Consultants:    Procedures:  Echo IMPRESSIONS     1. Left ventricular ejection fraction, by estimation, is 60 to 65%. The  left ventricle has normal function. The left ventricle has no regional  wall motion abnormalities. Left ventricular diastolic parameters were  normal.   2.  Right ventricular systolic function is normal. The right ventricular  size is normal. Tricuspid regurgitation signal is inadequate for assessing  PA pressure.   3. The mitral valve is normal in structure. Mild mitral valve  regurgitation. No evidence of mitral stenosis.   4. Tricuspid valve regurgitation is mild to moderate.   5. The aortic valve is normal in structure. Aortic valve regurgitation is  mild. No aortic stenosis is present.   6. There is mild dilatation of the ascending aorta, measuring 39 mm.   7. The inferior vena cava is normal in size with greater than 50%  respiratory variability, suggesting right atrial pressure of 3 mmHg.   Antimicrobials:  Anti-infectives (From admission, onward)    None       Subjective: Worsening pain in arm Refusing medications  Objective: Vitals:   07/29/24 2010 07/30/24 0456 07/30/24 0825 07/30/24 1410  BP: (!) 153/87 (!) 151/78 (!) 145/67 (!) 146/65  Pulse: (!) 113 (!) 104 (!) 110 (!) 120  Resp: 16 16 16 16   Temp: 98 F (36.7 C) 98.3 F (36.8 C) 97.7 F (36.5 C) 98.2 F (36.8 C)  TempSrc:   Oral Oral  SpO2: 98% 98% 98% 100%  Weight:      Height:        Intake/Output Summary (Last 24 hours) at 07/30/2024 1655 Last data filed at 07/30/2024 1500 Gross per 24 hour  Intake 1722.51 ml  Output 600 ml  Net 1122.51 ml   Filed Weights   07/29/24 1300  Weight: 53.9 kg    Examination:  General: No acute distress. Cardiovascular: RRR Lungs: unlabored Neurological: Alert and oriented 3. Moves all extremities 4 with equal strength. Cranial nerves II through XII grossly intact. Extremities: LUE sling    Data Reviewed: I have personally reviewed following labs and imaging studies  CBC: Recent Labs  Lab 07/27/24 1750 07/28/24 0459 07/28/24 1416 07/29/24 0449 07/30/24 0608  WBC 6.6 6.4  --  5.8 6.1  NEUTROABS  --   --   --  2.5  --   HGB 8.6* 9.7* 9.6* 9.6* 9.5*  HCT 24.9* 28.2* 28.3* 28.1* 28.3*  MCV 94.3 95.3   --  96.9 96.9  PLT 183 202  --  217 243    Basic Metabolic Panel: Recent Labs  Lab 07/27/24 1750 07/28/24 0459 07/29/24 0449 07/30/24 0608  NA 133* 138 137 138  K 3.2* 3.4* 3.8 3.5  CL 97* 104 105 103  CO2 23 21* 21* 20*  GLUCOSE 105* 71 77 77  BUN 8 <5* <5* 5*  CREATININE 0.51 0.53 0.49 0.49  CALCIUM  9.0 8.4* 8.2* 8.6*  MG  --  2.0 2.0 1.9  PHOS  --  3.6 3.3 3.6    GFR: Estimated Creatinine Clearance: 42.3 mL/min (by C-G formula based on SCr of 0.49 mg/dL).  Liver Function Tests: Recent Labs  Lab 07/27/24 1750 07/29/24 0449  AST 26 22  ALT <5 10  ALKPHOS 77 69  BILITOT 0.5 0.9  PROT 5.5* 5.1*  ALBUMIN 3.8 3.1*  CBG: No results for input(s): GLUCAP in the last 168 hours.   No results found for this or any previous visit (from the past 240 hours).       Radiology Studies: CT Angio Chest Pulmonary Embolism (PE) W or WO Contrast Result Date: 07/29/2024 EXAM: CTA CHEST 07/29/2024 04:47:12 PM TECHNIQUE: CTA of the chest was performed without and with the administration of 75 mL of intravenous contrast (iohexol  (OMNIPAQUE ) 350 MG/ML injection 75 mL IOHEXOL  350 MG/ML SOLN). Multiplanar reformatted images are provided for review. MIP images are provided for review. Automated exposure control, iterative reconstruction, and/or weight based adjustment of the mA/kV was utilized to reduce the radiation dose to as low as reasonably achievable. COMPARISON: Chest radiograph 07/27/2024 and C3-C7 05/03/2015. CLINICAL HISTORY: Sinus tachycardia, unexplained - recent fracture, unable to rule out with D-dimer. Chief complaints: Fall, weakness. FINDINGS: PULMONARY ARTERIES: Pulmonary arteries are adequately opacified for evaluation. No acute pulmonary embolus. Main pulmonary artery is normal in caliber. MEDIASTINUM: Coronary artery atherosclerotic calcification. No pericardial effusion. Aortic atherosclerotic calcification. There is no acute abnormality of the thoracic aorta. LYMPH  NODES: Calcified right hilar and mediastinal lymph nodes. LUNGS AND PLEURA: Mosaic attenuation of the lungs compatible with air trapping due to small airway infection or inflammation. No focal consolidation or pulmonary edema. Trace right pleural effusion. No pneumothorax. Calcified granuloma right lower lobe. UPPER ABDOMEN: Limited images of the upper abdomen are unremarkable. SOFT TISSUES AND BONES: Mildly displaced fractures of the right posterior 9th-11th ribs. The 9th-11th ribs are fractured in 2 places. 12th ribs are not included in the exam. IMPRESSION: 1. No pulmonary embolism. 2. Mildly displaced fractures of the right posterior 9th11th ribs, each fractured in two places. 3. Trace right pleural effusion. No pneumothorax. 4. Mosaic attenuation of the lungs compatible with air trapping due to small airway infection or inflammation. Electronically signed by: Norman Gatlin MD 07/29/2024 05:44 PM EDT RP Workstation: [id number]        Scheduled Meds:  acetaminophen   1,000 mg Oral TID   carbidopa -levodopa   1 tablet Oral 5 X Daily   cholecalciferol  400 Units Oral Daily   enoxaparin  (LOVENOX ) injection  30 mg Subcutaneous Q24H   mirtazapine   15 mg Oral QHS   polyethylene glycol  17 g Oral BID   rOPINIRole   4 mg Oral QHS   rosuvastatin   5 mg Oral Daily   Continuous Infusions:   LOS: 2 days    Time spent: over 30 min     Meliton Monte, MD [facility]   To contact the attending provider between 7A-7P or the covering provider during after hours 7P-7A, please log into the web site [email or web address] and access using universal [place] password for that web site. If you do not have the password, please call the hospital operator.  07/30/2024, 4:55 PM

## 2024-07-30 NOTE — Progress Notes (Signed)
   07/30/24 1757  Vitals  BP (!) 161/100  MAP (mmHg) 118  BP Location Right Arm  BP Method Automatic  Patient Position (if appropriate) Lying  Pulse Rate (!) 130  Pulse Rate Source Monitor  Resp 16  MEWS COLOR  MEWS Score Color Yellow  Oxygen Therapy  SpO2 100 %  O2 Device Room Air  MEWS Score  MEWS Temp 0  MEWS Systolic 0  MEWS Pulse 3  MEWS RR 0  MEWS LOC 0  MEWS Score 3

## 2024-07-30 NOTE — TOC Progression Note (Addendum)
 Transition of Care [facility]) - Progression Note    Patient Details  Name: Shannon Sutton MRN: [id number] Date of Birth: 1946-03-28  Transition of [facility] CM/SW Contact  Reilly Molchan, [place], [place] Phone Number: 07/30/2024, 10:30 AM  Clinical Narrative:    Phone call to Eleanor Aloe, RN at the [facility] assisted living to confirm patient's return . VM left for a return call. Patient daughter contacted as well, she or her sister will provide transportation back to [facility].  1:52pm Follow up with Eleanor Aloe , RN at [facility], patient able to return when medically [phone] cell or [phone] option 1. Melissa will need PT orders and DC summary to be faxed to [phone].  Wallie Lagrand, LCSW Transition of Care       Barriers to Discharge: Other (must enter comment) (Return to [facility] ALF vs SNF)               Expected Discharge Plan and Services In-house Referral: Clinical Social Work     Living arrangements for the past 2 months: Assisted Living Facility ([facility])                                       Social Drivers of Health (SDOH) Interventions SDOH Screenings   Food Insecurity: No Food Insecurity (07/28/2024)  Housing: Low Risk  (07/28/2024)  Transportation Needs: No Transportation Needs (07/28/2024)  Utilities: Not At Risk (07/28/2024)  Alcohol Screen: Low Risk  (11/10/2021)  Depression (PHQ2-9): High Risk (04/28/2024)  Financial Resource Strain: Low Risk  (04/27/2024)  Physical Activity: Inactive (04/27/2024)  Social Connections: Unknown (07/28/2024)  Stress: Stress Concern Present (04/27/2024)  Tobacco Use: Medium Risk (07/28/2024)    Readmission Risk Interventions     No data to display

## 2024-07-30 NOTE — Consult Note (Addendum)
 NEUROLOGY CONSULT NOTE   Date of service: July 30, 2024 Patient Name: Shannon Sutton MRN:  [id number] DOB:  1945/10/31 Chief Complaint: falls and generalized weakness. Requesting Provider: Perri DELENA Meliton Mickey., *  History of Present Illness  Shannon Sutton is a 78 y.o. female with hx of Parkinson's disease with related dementia , anxiety and depression, fibromyalgia, HTN, HLD, restless leg syndrome, orthostatic hypotension, urinary incontinence and arthritis who presented to [facility] for evaluation of fall and weakness. Follows with Neurologist in [place].  Daughters are at the bedside. Patient states her carbidopa  levodopa  was increased to 5 times a day but she discontinued it because she felt it was wearing off and making things worse and making her anxious. She also has a long history of orthostatic hypotension and most recently experiencing multiple fall in the last couple of weeks, with 2 of them causing her to be hospitalized with injuries. Patient indicates she lives in ALF and uses a walker. She states that the last couple of falls she had been mechanical and not related to hypotension. Discussed with her the importance of taking her Sinemet  as prescribed and that she will need close follow up with her neurologist. Family is requesting a referral to a different neurologist at this time. 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   Recurrent  Dr Ishmael 2009 had a bx/MRI: was on MTX and Prednisone   External hemorrhoids    Fibromyalgia with myofascial pain 11/16/2012    Headaches, cluster    History of CT scan of head 2022   History of EKG 2021   History of mammogram 2022   History of MRI 2022   Brain   Hypercholesterolemia 12/15/2013   Hypertension    Insomnia 11/16/2012   Myalgia and myositis, unspecified    Other disorder of muscle, ligament, and fascia    eosinophilic fascitis   Other specified disease of hair and hair follicles    Pain in joint, multiple sites    Parkinson's disease (HCC)    Restless leg syndrome 12/15/2013   Unspecified sleep apnea    Urine incontinence     Past Surgical History:  Procedure Laterality Date   ABDOMINAL HYSTERECTOMY     ABDOMINOPLASTY  2006   tummy tuck   BREAST REDUCTION SURGERY  1995   BREAST SURGERY     CATARACT EXTRACTION, BILATERAL     COLONOSCOPY  2018   MUSCLE BIOPSY  2009   DEEP TISSUE   REFRACTIVE SURGERY     TONSILLECTOMY     TOTAL ABDOMINAL HYSTERECTOMY  1999   WISDOM TOOTH EXTRACTION      Family History: Family History  Problem Relation Age of Onset   Colon polyps Mother    Heart disease Mother    Heart attack Mother    Heart attack Father    Colon cancer Father 68   Heart disease Father    COPD Sister    Emphysema Sister    COPD Brother    Emphysema Brother  Lung cancer Brother    Breast cancer Paternal Grandmother    Heart disease Paternal Grandfather    Colon polyps Paternal Grandfather    Heart disease Other        family history   Parkinson's disease Other    Stomach cancer Neg Hx    Rectal cancer Neg Hx    Esophageal cancer Neg Hx     Social History  reports that she quit smoking about 37 years ago. Her smoking use included cigarettes. She has never used smokeless tobacco. She reports that she does not currently use alcohol. She reports that she does not use drugs.  Allergies  Allergen Reactions   Crestor  [Rosuvastatin  Calcium ] Other (See Comments)    abd pain   Cymbalta  [Duloxetine  Hcl] Nausea Only   Effexor  Xr [Venlafaxine  Hcl Er] Other (See  Comments)    dizziness   Morphine And Codeine Itching    agitation   Pravastatin  Other (See Comments)    abd pain   Promethazine-Codeine Itching    Itching in face   Rizatriptan  Other (See Comments)    Tingling, prickly sensation, feelings of palpitations, flushing.     Medications   Current Facility-Administered Medications:    acetaminophen  (TYLENOL ) tablet 1,000 mg, 1,000 mg, Oral, TID, Lou, Prosper M, MD, 1,000 mg at 07/30/24 1507   bisacodyl (DULCOLAX) EC tablet 5 mg, 5 mg, Oral, Daily PRN, Lou, Claretta HERO, MD   carbidopa -levodopa  (SINEMET  IR) 25-100 MG per tablet immediate release 1 tablet, 1 tablet, Oral, 5 X Daily, Perri DELENA Meliton Mickey., MD, 1 tablet at 07/30/24 1508   cholecalciferol (VITAMIN D3) 10 MCG (400 UNIT) tablet 400 Units, 400 Units, Oral, Daily, Perri DELENA Meliton Mickey., MD, 400 Units at 07/30/24 9090   enoxaparin  (LOVENOX ) injection 30 mg, 30 mg, Subcutaneous, Q24H, Lou Claretta HERO, MD, 30 mg at 07/30/24 9090   mirtazapine  (REMERON ) tablet 15 mg, 15 mg, Oral, QHS, Lou Claretta HERO, MD, 15 mg at 07/29/24 2056   ondansetron  (ZOFRAN ) tablet 4 mg, 4 mg, Oral, Q6H PRN **OR** ondansetron  (ZOFRAN ) injection 4 mg, 4 mg, Intravenous, Q6H PRN, Amponsah, Prosper M, MD   oxyCODONE (Oxy IR/ROXICODONE) immediate release tablet 2.5-5 mg, 2.5-5 mg, Oral, Q6H PRN, Perri DELENA Meliton Mickey., MD   polyethylene glycol (MIRALAX  / GLYCOLAX ) packet 17 g, 17 g, Oral, BID, Perri DELENA Meliton Mickey., MD, 17 g at 07/30/24 9090   rOPINIRole  (REQUIP  XL) 24 hr tablet 4 mg, 4 mg, Oral, QHS, Chavez, Abigail, NP, 4 mg at 07/29/24 2056   rosuvastatin  (CRESTOR ) tablet 5 mg, 5 mg, Oral, Daily, Lou Claretta HERO, MD, 5 mg at 07/30/24 9090   senna-docusate (Senokot-S) tablet 1 tablet, 1 tablet, Oral, QHS PRN, Lou Claretta HERO, MD  Vitals   Vitals:   07/29/24 2010 07/30/24 0456 07/30/24 0825 07/30/24 1410  BP: (!) 153/87 (!) 151/78 (!) 145/67 (!) 146/65  Pulse: (!) 113 (!) 104 (!) 110 (!)  120  Resp: 16 16 16 16   Temp: 98 F (36.7 C) 98.3 F (36.8 C) 97.7 F (36.5 C) 98.2 F (36.8 C)  TempSrc:   Oral Oral  SpO2: 98% 98% 98% 100%  Weight:      Height:        Body mass index is 22.46 kg/m.   Physical Exam   Constitutional: Appears well-developed and well-nourished.  Psych: Affect appropriate to situation.  Eyes: No scleral injection.  HENT: No OP obstruction.  Head: Normocephalic.  Cardiovascular: Normal rate and regular rhythm.  Respiratory:  Effort normal, non-labored breathing.  GI: Soft.  No distension. There is no tenderness.  Skin: WDI.   Neurologic Examination   Mental Status -  Level of arousal and orientation to time, place, and person were intact. Language including expression, naming, repetition, comprehension was assessed and found intact  Cranial Nerves II - XII - II - Visual field intact OU. III, IV, VI - Extraocular movements intact. V - Facial sensation intact bilaterally. VII - Facial movement intact bilaterally. VIII - Hearing & vestibular intact bilaterally. X - Palate elevates symmetrically. XI - Chin turning & shoulder shrug intact bilaterally. XII - Tongue protrusion intact.  Motor Strength - right arm limited movement due to shoulder injury, left arm in sling, bilateral lowers 4/5.  Sensory - Light touch, temperature/pinprick were assessed and were symmetrical.   Coordination - unable to assess   Gait and Station - deferred.  Labs/Imaging/Neurodiagnostic studies   CBC:  Recent Labs  Lab August 12, 2024 0449 07/30/24 0608  WBC 5.8 6.1  NEUTROABS 2.5  --   HGB 9.6* 9.5*  HCT 28.1* 28.3*  MCV 96.9 96.9  PLT 217 243   Basic Metabolic Panel:  Lab Results  Component Value Date   NA 138 07/30/2024   K 3.5 07/30/2024   CO2 20 (L) 07/30/2024   GLUCOSE 77 07/30/2024   BUN 5 (L) 07/30/2024   CREATININE 0.49 07/30/2024   CALCIUM  8.6 (L) 07/30/2024   GFRNONAA >60 07/30/2024   GFRAA 90 02/18/2019   Lipid Panel:  Lab Results   Component Value Date   LDLCALC 106 (H) 05/19/2024   HgbA1c:  Lab Results  Component Value Date   HGBA1C 4.7 (L) 11/08/2021   Urine Drug Screen:     Component Value Date/Time   LABOPIA NONE DETECTED 11/07/2021 1638   COCAINSCRNUR NONE DETECTED 11/07/2021 1638   LABBENZ POSITIVE (A) 11/07/2021 1638   AMPHETMU NONE DETECTED 11/07/2021 1638   THCU NONE DETECTED 11/07/2021 1638   LABBARB NONE DETECTED 11/07/2021 1638    Alcohol Level     Component Value Date/Time   ETH <10 11/08/2021 0940   INR No results found for: INR APTT No results found for: APTT AED levels: No results found for: PHENYTOIN, ZONISAMIDE, LAMOTRIGINE, LEVETIRACETA  CT Head without contrast(Personally reviewed):  10/15  Generalized cerebral atrophy and microvascular disease changes of the supratentorial brain. 2. Mild left occipital scalp soft tissue swelling without evidence for an acute fracture or acute intracranial abnormality   ASSESSMENT   DARNELLE DERRICK is a 78 y.o. female hx of Parkinson's disease with related dementia , anxiety and depression, fibromyalgia, HTN, HLD, restless leg syndrome, orthostatic hypotension, urinary incontinence and arthritis who presented to [facility] for evaluation of fall and weakness.   Patient states her carbidopa  levodopa  was increased to 5 times a day but she discontinued it because she felt it was wearing off and making things worse and making her anxious. She also has a long history of orthostatic hypotension and most recently experiencing multiple fall in the last couple of weeks, with 2 of them causing her to be hospitalized with injuries. Patient indicates she lives in ALF and uses a walker. She states that the last couple of falls she had been mechanical and not related to hypotension  RECOMMENDATIONS  - Recommend continuing home Sinemet  - wear TED hose. Could try abd binder as well to help with orthostatic hypotension  Non-Pharmacologic Recs for  Orthostatic Hypotension 1) Lifestyle modification. These measures include:  - Arising slowly,  in stages, from supine to seated to standing. This maneuver is most important in the morning, when orthostatic tolerance is lowest.  - Avoiding straining, coughing, and walking in hot weather.These activities reduce venous return and worsen orthostatic hypotension.  - Maintaining hydration and avoiding over-heating.  - Raising the head of the bed 30 to 45 degrees decreases renal perfusion, thereby activating the renin-angiotensin-aldosterone system and decreasing nocturnal diuresis, which can be pronounced in these patients.  These changes relieve orthostatic hypotension by expanding extracellular fluid volume and may reduce end organ damage by reducing supine HTN.  2) Exercise - walking 30 minutes a day, exercise in a swimming pool, exercise in a recumbent or seated position (using a stationary bike or rowing machine)  3) Abdominal binders   4) Compression Stockings  5) Increased salt and water intake to 2 L to 2.5 L of water a day  6) Modification of meals:  - Avoiding large meals  - Ingesting meals low in carbohydrates  - Alcohol should be avoided during the day as it is a vasodilator  - Drink water with meals  - Avoiding activities or sudden standing immediately after eating  7) Physical Countermaneuvers during daily activities: leg crossing, standing on tip toes, squatting - will need close follow up with outpatient Neurology to adjust medications. Family requesting a referral to Neurologist in [facility]  - Neurology will sign off. Please call with questions or concerns   ______________________________________________________________________    Signed, Karna DELENA Geralds, NP [facility]  NEUROHOSPITALIST ADDENDUM Performed a face to face diagnostic evaluation.   I have reviewed the contents of history and physical exam as documented by PA/ARNP/Resident and agree with above  documentation.  I have discussed and formulated the above plan as documented. Edits to the note have been made as needed.  Rasa Degrazia, MD [facility]

## 2024-07-31 DIAGNOSIS — R55 Syncope and collapse: Secondary | ICD-10-CM | POA: Diagnosis not present

## 2024-07-31 LAB — CBC WITH DIFFERENTIAL/PLATELET
Abs Immature Granulocytes: 0.04 K/uL (ref 0.00–0.07)
Basophils Absolute: 0.1 K/uL (ref 0.0–0.1)
Basophils Relative: 1 %
Eosinophils Absolute: 0.1 K/uL (ref 0.0–0.5)
Eosinophils Relative: 2 %
HCT: 27.5 % — ABNORMAL LOW (ref 36.0–46.0)
Hemoglobin: 9.2 g/dL — ABNORMAL LOW (ref 12.0–15.0)
Immature Granulocytes: 1 %
Lymphocytes Relative: 23 %
Lymphs Abs: 1.7 K/uL (ref 0.7–4.0)
MCH: 32.6 pg (ref 26.0–34.0)
MCHC: 33.5 g/dL (ref 30.0–36.0)
MCV: 97.5 fL (ref 80.0–100.0)
Monocytes Absolute: 0.7 K/uL (ref 0.1–1.0)
Monocytes Relative: 9 %
Neutro Abs: 4.8 K/uL (ref 1.7–7.7)
Neutrophils Relative %: 64 %
Platelets: 270 K/uL (ref 150–400)
RBC: 2.82 MIL/uL — ABNORMAL LOW (ref 3.87–5.11)
RDW: 13.9 % (ref 11.5–15.5)
WBC: 7.4 K/uL (ref 4.0–10.5)
nRBC: 0 % (ref 0.0–0.2)

## 2024-07-31 LAB — COMPREHENSIVE METABOLIC PANEL WITH GFR
ALT: 12 U/L (ref 0–44)
AST: 20 U/L (ref 15–41)
Albumin: 3.1 g/dL — ABNORMAL LOW (ref 3.5–5.0)
Alkaline Phosphatase: 69 U/L (ref 38–126)
Anion gap: 15 (ref 5–15)
BUN: 5 mg/dL — ABNORMAL LOW (ref 8–23)
CO2: 19 mmol/L — ABNORMAL LOW (ref 22–32)
Calcium: 8.6 mg/dL — ABNORMAL LOW (ref 8.9–10.3)
Chloride: 103 mmol/L (ref 98–111)
Creatinine, Ser: 0.55 mg/dL (ref 0.44–1.00)
GFR, Estimated: >60 mL/min (ref >60)
Glucose, Bld: 95 mg/dL (ref 70–99)
Potassium: 4.2 mmol/L (ref 3.5–5.1)
Sodium: 137 mmol/L (ref 135–145)
Total Bilirubin: 1.2 mg/dL (ref 0.0–1.2)
Total Protein: 5.3 g/dL — ABNORMAL LOW (ref 6.5–8.1)

## 2024-07-31 LAB — PHOSPHORUS: Phosphorus: 3.5 mg/dL (ref 2.5–4.6)

## 2024-07-31 LAB — MAGNESIUM: Magnesium: 1.8 mg/dL (ref 1.7–2.4)

## 2024-07-31 LAB — CORTISOL: Cortisol, Plasma: 18.3 ug/dL

## 2024-07-31 MED ORDER — HYDROXYZINE HCL 25 MG PO TABS
25.0000 mg | ORAL_TABLET | Freq: Once | ORAL | Status: AC
  Administered 2024-07-31: 25 mg via ORAL
  Filled 2024-07-31: qty 1

## 2024-07-31 NOTE — Progress Notes (Signed)
 Patient ID: Shannon Sutton, female   DOB: 07-Jun-1946, 78 y.o.   MRN: [id number] The patient is an 78 year old female who recently had a mechanical fall and sustained a left proximal humerus fracture at the surgical neck.  She is seen by my partner Dr. Glendia Hutchinson.  He did see her last Wednesday morning with family.  They are at the bedside also today.  The patient was placed in a sling and the thoughts are trying to treat this conservatively depending on the progression of the fracture in terms of worsening displacement and the patient's discomfort.  The patient was then admitted Wednesday evening.  We were called today because new x-rays show the fracture has changed some.  I did review those x-rays and there is some more angulation of the fracture.  The humeral head is well located.  At the bedside this morning there is no soft tissue compromise of her left shoulder.  Family is at the bedside and the patient is uncomfortable mainly when she is trying to mobilize and turn in bed.  She is otherwise stable.  I will reach out to Dr. Hutchinson later today and see if he can check on her tomorrow and make any other recommendations as to whether or not surgery would be helpful.  The patient and the family understand this as well and are still comfortable with trying to proceed with conservative nonoperative treatment if it can be tolerated.

## 2024-07-31 NOTE — Progress Notes (Signed)
 Patient ID: Shannon Sutton, female   DOB: 1946-06-27, 79 y.o.   MRN: [id number]   Left shoulder fx has become more displaced Pt wants to avoid surgery Will discuss with her monday

## 2024-07-31 NOTE — Progress Notes (Signed)
 PROGRESS NOTE    Shannon Sutton  FMW:[id number] DOB: 18-Jul-1946 DOA: 07/27/2024 PCP: Theophilus Andrews, Tully GRADE, MD  Chief Complaint  Patient presents with   Fall   Weakness    Brief Narrative:   Shannon Sutton is Shannon Sutton 78 y.o. female with medical history significant for Parkinson's disease, anxiety and depression, fibromyalgia, HTN, HLD, restless leg syndrome, urinary incontinence and arthritis who presented to [facility] for evaluation of fall and weakness and admitted for further evaluation.   Assessment & Plan:   Principal Problem:   Fall Active Problems:   Near syncope   Symptomatic anemia   Closed fracture of proximal end of left humerus with routine healing   Parkinson's disease (HCC)   Hypokalemia   Generalized weakness  # Fall # Generalized weakness # Orthostatic Hypotension  # Parkinson's Disease - increased falls in the past few weeks.  In setting of her parkinson's disease.  Also, recent increase in dose of her sinemet  (from 1 pill 5 times [name] day to 1.5 pills 4 times daily and 1 pill nightly - daughter showed me [name] mychart message where they decided to trial this with her outpatient neurologist).  She's orthostatic, which I suspect is related to parkinsons +/- sinemet .  Also with new anemia, but I doubt this is playing [name] major role given stability and mild anemia with hb in 9's. - echo with preserved EF, no AS - persistent orthostasis, bolus again - compression stockings, abdominal binder, change positions slowly, elevated HOB - TSH wnl - follow cortisol  - PT/OT eval and treat - Fall precautions - given increasing falls after increasing sinemet  dose, will resume dose from [name] few weeks ago (25-100 mg 5 times daily) - unfortunately, she's continuing to refuse her sinemet  with concern it's making her symptoms worse (discussed risks of sudden discontinuation of sinemet ).  Doesn't seem like she's going to be agreeable to continue.  Will discuss with neurology.  -  appreciate neurology assistance - recommending continued sinemet   # Sinus Tachycardia - persistent - related to withdrawal of sinemet ? - EKG with sinus tach - TSH wnl  - CT PE protocol without PE - will monitor for now   # Pleuritic Chest Pain - related to rib fractures? - CT PE protocol reassuring, will have low threshold to repeat if worsening  # Normocytic Anemia - Hgb of 8.6 on admission from baseline of 12-13 - stable today around 9.2 - no clear indication for transfusion at this point - labs consistent with low normal iron.  Normal b12. Folate wnl. - follow retic (inadequate) - warrants additional outpatient follow up for her new anemia   # Hypokalemia - replace and follow  # Left humerus fracture - Patient had follow-up with orthopedic in the outpatient on day of admission - Repeat x-ray shows displaced Acute transverse fracture through the proximal diaphysis of the left humerus - Continue left shoulder sling - Pain control with scheduled Tylenol  and as needed low-dose Oxy - dose of toradol - Follow-up with orthopedic surgery in the outpatient - plan from 10/15 notes sling immobilization full time for next 10 days, follow up for x rays - repeat imaging with increased angulation and displacement, appreciate additional ortho recs   # Scalp Laceration - stapled, will need to come out 7-10 days (10/23-26)  # Degenerative disc disease - Back imaging shows degenerative changes in the L-spine and C-spine with signs of osteopenia - Pain control as above  # Restless leg syndrome - Continue  Requip  - Follow-up ferritin levels   # Anxiety and depression - Continue mirtazapine    # Hyperlipidemia - Continue rosuvastatin     DVT prophylaxis: lovenox  Code Status: full Family Communication: 2 daughters at bedside Disposition:   Status is: Observation The patient will require care spanning > 2 midnights and should be moved to inpatient because: need for continued  inpatient care   Consultants:    Procedures:  Echo IMPRESSIONS     1. Left ventricular ejection fraction, by estimation, is 60 to 65%. The  left ventricle has normal function. The left ventricle has no regional  wall motion abnormalities. Left ventricular diastolic parameters were  normal.   2. Right ventricular systolic function is normal. The right ventricular  size is normal. Tricuspid regurgitation signal is inadequate for assessing  PA pressure.   3. The mitral valve is normal in structure. Mild mitral valve  regurgitation. No evidence of mitral stenosis.   4. Tricuspid valve regurgitation is mild to moderate.   5. The aortic valve is normal in structure. Aortic valve regurgitation is  mild. No aortic stenosis is present.   6. There is mild dilatation of the ascending aorta, measuring 39 mm.   7. The inferior vena cava is normal in size with greater than 50%  respiratory variability, suggesting right atrial pressure of 3 mmHg.   Antimicrobials:  Anti-infectives (From admission, onward)    None       Subjective: C/o some right side pain with breathing  Objective: Vitals:   07/30/24 1757 07/30/24 2005 07/31/24 0411 07/31/24 0741  BP: (!) 161/100 139/73 120/63 (!) 174/71  Pulse: (!) 130 (!) 116 (!) 116   Resp: 16 16 16 18   Temp:  98.6 F (37 C) 98.1 F (36.7 C) 98.3 F (36.8 C)  TempSrc:      SpO2: 100% 100% 98% 99%  Weight:      Height:        Intake/Output Summary (Last 24 hours) at 07/31/2024 1147 Last data filed at 07/30/2024 1800 Gross per 24 hour  Intake 1242.51 ml  Output 600 ml  Net 642.51 ml   Filed Weights   07/29/24 1300  Weight: 53.9 kg    Examination:  General: No acute distress. Cardiovascular: tachycardic, regular Lungs: unlabored Neurological: Alert and oriented 3. Moves all extremities 4 with equal strength. Cranial nerves II through XII grossly intact. Extremities: No clubbing or cyanosis. No edema.  LUE sling.    Data  Reviewed: I have personally reviewed following labs and imaging studies  CBC: Recent Labs  Lab 07/27/24 1750 07/28/24 0459 07/28/24 1416 07/29/24 0449 07/30/24 0608 07/31/24 0730  WBC 6.6 6.4  --  5.8 6.1 7.4  NEUTROABS  --   --   --  2.5  --  4.8  HGB 8.6* 9.7* 9.6* 9.6* 9.5* 9.2*  HCT 24.9* 28.2* 28.3* 28.1* 28.3* 27.5*  MCV 94.3 95.3  --  96.9 96.9 97.5  PLT 183 202  --  217 243 270    Basic Metabolic Panel: Recent Labs  Lab 07/27/24 1750 07/28/24 0459 07/29/24 0449 07/30/24 0608 07/31/24 0730  NA 133* 138 137 138 137  K 3.2* 3.4* 3.8 3.5 4.2  CL 97* 104 105 103 103  CO2 23 21* 21* 20* 19*  GLUCOSE 105* 71 77 77 95  BUN 8 <5* <5* 5* 5*  CREATININE 0.51 0.53 0.49 0.49 0.55  CALCIUM  9.0 8.4* 8.2* 8.6* 8.6*  MG  --  2.0 2.0 1.9 1.8  PHOS  --  3.6 3.3 3.6 3.5    GFR: Estimated Creatinine Clearance: 42.3 mL/min (by C-G formula based on SCr of 0.55 mg/dL).  Liver Function Tests: Recent Labs  Lab 07/27/24 1750 07/29/24 0449 07/31/24 0730  AST 26 22 20   ALT 5 10 12   ALKPHOS 77 69 69  BILITOT 0.5 0.9 1.2  PROT 5.5* 5.1* 5.3*  ALBUMIN 3.8 3.1* 3.1*    CBG: No results for input(s): GLUCAP in the last 168 hours.   No results found for this or any previous visit (from the past 240 hours).       Radiology Studies: DG Humerus Left Result Date: 07/30/2024 CLINICAL DATA:  Pain after recent fall. EXAM: LEFT HUMERUS - 2+ VIEW; LEFT ELBOW - 2 VIEW COMPARISON:  Shoulder radiograph 07/27/2024 FINDINGS: Humerus: Displaced proximal humeral fracture demonstrates increased angulation and displacement from prior shoulder exam. Ileus 17 mm osseous overlap. No glenohumeral dislocation. The distal humerus is intact. Soft tissue edema is seen at the fracture site. Elbow: No elbow fracture or dislocation. No convincing elbow joint effusion. No significant arthropathy. IMPRESSION: 1. Displaced proximal humeral fracture with increased angulation and displacement from prior  shoulder exam. 2. No fracture of the distal humerus or elbow. Electronically Signed   By: Andrea Gasman M.D.   On: 07/30/2024 18:36   DG Elbow 2 Views Left Result Date: 07/30/2024 CLINICAL DATA:  Pain after recent fall. EXAM: LEFT HUMERUS - 2+ VIEW; LEFT ELBOW - 2 VIEW COMPARISON:  Shoulder radiograph 07/27/2024 FINDINGS: Humerus: Displaced proximal humeral fracture demonstrates increased angulation and displacement from prior shoulder exam. Ileus 17 mm osseous overlap. No glenohumeral dislocation. The distal humerus is intact. Soft tissue edema is seen at the fracture site. Elbow: No elbow fracture or dislocation. No convincing elbow joint effusion. No significant arthropathy. IMPRESSION: 1. Displaced proximal humeral fracture with increased angulation and displacement from prior shoulder exam. 2. No fracture of the distal humerus or elbow. Electronically Signed   By: Andrea Gasman M.D.   On: 07/30/2024 18:36   CT Angio Chest Pulmonary Embolism (PE) W or WO Contrast Result Date: 07/29/2024 EXAM: CTA CHEST 07/29/2024 04:47:12 PM TECHNIQUE: CTA of the chest was performed without and with the administration of 75 mL of intravenous contrast (iohexol  (OMNIPAQUE ) 350 MG/ML injection 75 mL IOHEXOL  350 MG/ML SOLN). Multiplanar reformatted images are provided for review. MIP images are provided for review. Automated exposure control, iterative reconstruction, and/or weight based adjustment of the mA/kV was utilized to reduce the radiation dose to as low as reasonably achievable. COMPARISON: Chest radiograph 07/27/2024 and C3-C7 05/03/2015. CLINICAL HISTORY: Sinus tachycardia, unexplained - recent fracture, unable to rule out with D-dimer. Chief complaints: Fall, weakness. FINDINGS: PULMONARY ARTERIES: Pulmonary arteries are adequately opacified for evaluation. No acute pulmonary embolus. Main pulmonary artery is normal in caliber. MEDIASTINUM: Coronary artery atherosclerotic calcification. No pericardial  effusion. Aortic atherosclerotic calcification. There is no acute abnormality of the thoracic aorta. LYMPH NODES: Calcified right hilar and mediastinal lymph nodes. LUNGS AND PLEURA: Mosaic attenuation of the lungs compatible with air trapping due to small airway infection or inflammation. No focal consolidation or pulmonary edema. Trace right pleural effusion. No pneumothorax. Calcified granuloma right lower lobe. UPPER ABDOMEN: Limited images of the upper abdomen are unremarkable. SOFT TISSUES AND BONES: Mildly displaced fractures of the right posterior 9th-11th ribs. The 9th-11th ribs are fractured in 2 places. 12th ribs are not included in the exam. IMPRESSION: 1. No pulmonary embolism. 2. Mildly displaced fractures of the right posterior 9th11th ribs, each  fractured in two places. 3. Trace right pleural effusion. No pneumothorax. 4. Mosaic attenuation of the lungs compatible with air trapping due to small airway infection or inflammation. Electronically signed by: Norman Gatlin MD 07/29/2024 05:44 PM EDT RP Workstation: [id number]        Scheduled Meds:  acetaminophen   1,000 mg Oral TID   carbidopa -levodopa   1 tablet Oral 5 X Daily   cholecalciferol  400 Units Oral Daily   enoxaparin  (LOVENOX ) injection  30 mg Subcutaneous Q24H   mirtazapine   15 mg Oral QHS   polyethylene glycol  17 g Oral BID   rOPINIRole   4 mg Oral QHS   rosuvastatin   5 mg Oral Daily   Continuous Infusions:   LOS: 3 days    Time spent: over 30 min     Meliton Monte, MD [facility]   To contact the attending provider between 7A-7P or the covering provider during after hours 7P-7A, please log into the web site [email or web address] and access using universal [place] password for that web site. If you do not have the password, please call the hospital operator.  07/31/2024, 11:47 AM

## 2024-07-31 NOTE — Progress Notes (Signed)
 Mobility Specialist Progress Note:    07/31/24 0950  Mobility  Activity Ambulated with assistance  Level of Assistance Contact guard assist, steadying assist  Assistive Device Other (Comment) (HHA)  Distance Ambulated (ft) 25 ft  Activity Response Tolerated fair  Mobility Referral Yes  Mobility visit 1 Mobility  Mobility Specialist Start Time (ACUTE ONLY) 0950  Mobility Specialist Stop Time (ACUTE ONLY) 1011  Mobility Specialist Time Calculation (min) (ACUTE ONLY) 21 min   Received pt laying in bed w/ daughters in room. Pt c/o some left shoulder and rib pain when sitting upright but otherwise no issues. Pt able to stand on own w/ little to no assist. Pt taking short small steps when ambulating. Returned pt to recliner w/ all needs met.   Venetia Keel Mobility Specialist Please Neurosurgeon or Rehab Office at [phone]

## 2024-08-01 DIAGNOSIS — S42292D Other displaced fracture of upper end of left humerus, subsequent encounter for fracture with routine healing: Secondary | ICD-10-CM | POA: Diagnosis not present

## 2024-08-01 DIAGNOSIS — D649 Anemia, unspecified: Secondary | ICD-10-CM | POA: Diagnosis not present

## 2024-08-01 LAB — CBC WITH DIFFERENTIAL/PLATELET
Abs Immature Granulocytes: 0.05 K/uL (ref 0.00–0.07)
Basophils Absolute: 0.1 K/uL (ref 0.0–0.1)
Basophils Relative: 1 %
Eosinophils Absolute: 0.3 K/uL (ref 0.0–0.5)
Eosinophils Relative: 4 %
HCT: 26.4 % — ABNORMAL LOW (ref 36.0–46.0)
Hemoglobin: 9 g/dL — ABNORMAL LOW (ref 12.0–15.0)
Immature Granulocytes: 1 %
Lymphocytes Relative: 28 %
Lymphs Abs: 2.1 K/uL (ref 0.7–4.0)
MCH: 33 pg (ref 26.0–34.0)
MCHC: 34.1 g/dL (ref 30.0–36.0)
MCV: 96.7 fL (ref 80.0–100.0)
Monocytes Absolute: 0.7 K/uL (ref 0.1–1.0)
Monocytes Relative: 9 %
Neutro Abs: 4.3 K/uL (ref 1.7–7.7)
Neutrophils Relative %: 57 %
Platelets: 271 K/uL (ref 150–400)
RBC: 2.73 MIL/uL — ABNORMAL LOW (ref 3.87–5.11)
RDW: 14.6 % (ref 11.5–15.5)
WBC: 7.6 K/uL (ref 4.0–10.5)
nRBC: 0 % (ref 0.0–0.2)

## 2024-08-01 LAB — COMPREHENSIVE METABOLIC PANEL WITH GFR
ALT: 7 U/L (ref 0–44)
AST: 21 U/L (ref 15–41)
Albumin: 3.2 g/dL — ABNORMAL LOW (ref 3.5–5.0)
Alkaline Phosphatase: 74 U/L (ref 38–126)
Anion gap: 13 (ref 5–15)
BUN: 8 mg/dL (ref 8–23)
CO2: 22 mmol/L (ref 22–32)
Calcium: 8.6 mg/dL — ABNORMAL LOW (ref 8.9–10.3)
Chloride: 101 mmol/L (ref 98–111)
Creatinine, Ser: 0.42 mg/dL — ABNORMAL LOW (ref 0.44–1.00)
GFR, Estimated: >60 mL/min (ref >60)
Glucose, Bld: 97 mg/dL (ref 70–99)
Potassium: 3.8 mmol/L (ref 3.5–5.1)
Sodium: 136 mmol/L (ref 135–145)
Total Bilirubin: 1.1 mg/dL (ref 0.0–1.2)
Total Protein: 5.7 g/dL — ABNORMAL LOW (ref 6.5–8.1)

## 2024-08-01 LAB — MAGNESIUM: Magnesium: 1.9 mg/dL (ref 1.7–2.4)

## 2024-08-01 LAB — PHOSPHORUS: Phosphorus: 3.5 mg/dL (ref 2.5–4.6)

## 2024-08-01 NOTE — Progress Notes (Signed)
 Physical Therapy Treatment Patient Details Name: Shannon Sutton MRN: [id number] DOB: 1946-07-13 Today's Date: 08/01/2024   History of Present Illness Shannon Sutton is a 78 y.o. female who presented to [facility] ED 07/27/14 after fall and c/o weakness. CT head with no acute intracranial abnormality. Repeat LUE x-ray shows displaced acute transverse fracture through the proximal diaphysis of the left humerus. PMHx: Parkinson's disease, anxiety and depression, fibromyalgia, HTN, HLD, restless leg syndrome, urinary incontinence, and arthritis. Of note, recent hospitalization 07/25/24 d/t fall sustaining mildly displaced fracture of the proximal left humeral metaphysis.    PT Comments  Pt is limited by continued orthostatic hypotension during session, with tachycardia into 140s and BP of 86/68 after standing (BP taken shortly after sitting back down). Pt did not wear compression stockings during this session, reporting pain at upper end of stockings due to pressure, however PT encourages utilization of stockings for out of bed mobility with subsequent attempts due to hypotension. Pt remains at a high risk for falls due to weakness and orthostatic BP. PT will continue to follow in an effort to restore independence. Patient will benefit from continued inpatient follow up therapy, <3 hours/day.    If plan is discharge home, recommend the following: A little help with walking and/or transfers;A little help with bathing/dressing/bathroom;Assistance with cooking/housework;Assist for transportation;Help with stairs or ramp for entrance   Can travel by private vehicle     Yes  Equipment Recommendations  Cane    Recommendations for Other Services       Precautions / Restrictions Precautions Precautions: Fall Recall of Precautions/Restrictions: Impaired Precaution/Restrictions Comments: LUE in immobilizer Required Braces or Orthoses: Sling Restrictions Weight Bearing Restrictions Per Provider Order:  Yes LUE Weight Bearing Per Provider Order: Non weight bearing     Mobility  Bed Mobility Overal bed mobility: Needs Assistance Bed Mobility: Supine to Sit     Supine to sit: Min assist, HOB elevated          Transfers Overall transfer level: Needs assistance Equipment used: 1 person hand held assist Transfers: Sit to/from Stand Sit to Stand: Contact guard assist   Step pivot transfers: Contact guard assist            Ambulation/Gait Ambulation/Gait assistance:  (pt declines, also continues to have orthostatic hypotension)                 Stairs             Wheelchair Mobility     Tilt Bed    Modified Rankin (Stroke Patients Only)       Balance Overall balance assessment: Needs assistance Sitting-balance support: No upper extremity supported, Feet supported Sitting balance-Leahy Scale: Fair     Standing balance support: Single extremity supported, Reliant on assistive device for balance Standing balance-Leahy Scale: Poor                              Communication Communication Communication: No apparent difficulties  Cognition Arousal: Alert Behavior During Therapy: WFL for tasks assessed/performed   PT - Cognitive impairments: Memory                       PT - Cognition Comments: poor recall of need for sling use Following commands: Intact      Cueing Cueing Techniques: Verbal cues  Exercises      General Comments General comments (skin integrity, edema, etc.): tachycardic with mobility with  HR into 140s when sititng up. BP found to be 86/68 after standing initially. BP subsequently 102/53 in recliner with LE elevated.      Pertinent Vitals/Pain Pain Assessment Pain Assessment: Faces Faces Pain Scale: Hurts even more Pain Location: LUE Pain Descriptors / Indicators: Grimacing Pain Intervention(s): Monitored during session    Home Living                          Prior Function             PT Goals (current goals can now be found in the care plan section) Acute Rehab PT Goals Patient Stated Goal: Stop falling Progress towards PT goals: Not progressing toward goals - comment (limited by orthostatic hypotension)    Frequency    Min 2X/week      PT Plan      Co-evaluation              AM-PAC PT 6 Clicks Mobility   Outcome Measure  Help needed turning from your back to your side while in a flat bed without using bedrails?: A Little Help needed moving from lying on your back to sitting on the side of a flat bed without using bedrails?: A Little Help needed moving to and from a bed to a chair (including a wheelchair)?: A Little Help needed standing up from a chair using your arms (e.g., wheelchair or bedside chair)?: A Little Help needed to walk in hospital room?: A Lot Help needed climbing 3-5 steps with a railing? : Total 6 Click Score: 15    End of Session Equipment Utilized During Treatment: Gait belt Activity Tolerance: Patient limited by fatigue;Other (comment);Treatment limited secondary to medical complications (Comment) (orthostatic hypotension) Patient left: in chair;with call bell/phone within reach;with chair alarm set;with family/visitor present Nurse Communication: Mobility status PT Visit Diagnosis: History of falling (Z91.81);Repeated falls (R29.6);Unsteadiness on feet (R26.81);Other abnormalities of gait and mobility (R26.89)     Time: 8296-8254 PT Time Calculation (min) (ACUTE ONLY): 42 min  Charges:    $Therapeutic Activity: 23-37 mins $Self Care/Home Management: 8-22 PT General Charges $$ ACUTE PT VISIT: 1 Visit                     Bernardino JINNY Ruth, PT, DPT Acute Rehabilitation Office [phone]    Bernardino JINNY Ruth 08/01/2024, 5:55 PM

## 2024-08-01 NOTE — Progress Notes (Signed)
 I talked with Shannon Sutton's daughter today.  In general the fracture is becoming more displaced.  Patient is having a lot of falls.  The more displaced the fracture becomes a list functional shoulder she will have and the higher likelihood there is for a nonunion.  Also the possibility exists that with open reduction internal fixation if she falls again she could create a more significant problem with a Perry prosthetic type fracture around the plate.  They are going to consider their options and let me know tomorrow if they are thinking surgical or nonsurgical.  In addition with increasing displacement the chances for nonunion do go up..  It is a difficult situation in terms of optimizing management to keep her as functional as possible without subjecting her to unnecessary risk.

## 2024-08-01 NOTE — Progress Notes (Signed)
 PROGRESS NOTE    Shannon Sutton  FMW:[id number] DOB: [birth year] DOA: 07/27/2024 PCP: Theophilus Andrews, Tully GRADE, MD  Chief Complaint  Patient presents with   Fall   Weakness    Brief Narrative:   Shannon Sutton is Shannon Sutton 78 y.o. female with medical history significant for Parkinson's disease, anxiety and depression, fibromyalgia, HTN, HLD, restless leg syndrome, urinary incontinence and arthritis who presented to [facility] for evaluation of fall and weakness and admitted for further evaluation.   Assessment & Plan:   Principal Problem:   Fall Active Problems:   Near syncope   Symptomatic anemia   Closed fracture of proximal end of left humerus with routine healing   Parkinson's disease (HCC)   Hypokalemia   Generalized weakness  # Fall # Generalized weakness # Orthostatic Hypotension  # Parkinson's Disease - increased falls in the past few weeks.  In setting of her parkinson's disease.  Also, recent increase in dose of her sinemet  (from 1 pill 5 times Shannon Sutton day to 1.5 pills 4 times daily and 1 pill nightly - daughter showed me Shannon Sutton mychart message where they decided to trial this with her outpatient neurologist).  She's orthostatic, which I suspect is related to parkinsons +/- sinemet .  Also with new anemia, but I doubt this is playing Shannon Sutton major role given stability and mild anemia with hb in 9's. - echo with preserved EF, no AS - persistent orthostasis, bolus again - compression stockings, abdominal binder, change positions slowly, elevated HOB - TSH wnl - follow cortisol  - PT/OT eval and treat - Fall precautions - given increasing falls after increasing sinemet  dose, will resume dose from Shannon Sutton few weeks ago (25-100 mg 5 times daily) - unfortunately, she's continuing to refuse her sinemet  with concern it's making her symptoms worse (discussed risks of sudden discontinuation of sinemet ).  Doesn't seem like she's going to be agreeable to continue.  Will discuss symptoms to watch out for.   She should follow with her outpatient neurologist.  - appreciate neurology assistance - recommending continued sinemet   # Sinus Tachycardia - persistent - related to withdrawal of sinemet ? - EKG with sinus tach - TSH wnl  - CT PE protocol without PE - will monitor for now   # Pleuritic Chest Pain - related to rib fractures? - CT PE protocol reassuring - seems resolved today, monitor  # Normocytic Anemia - Hgb of 8.6 on admission from baseline of 12-13 - stable today around 9.2 - no clear indication for transfusion at this point - labs consistent with low normal iron.  Normal b12. Folate wnl. - follow retic (inadequate) - warrants additional outpatient follow up for her new anemia   # Hypokalemia - replace and follow  # Left humerus fracture - Patient had follow-up with orthopedic in the outpatient on day of admission - Repeat x-ray shows displaced Acute transverse fracture through the proximal diaphysis of the left humerus - Continue left shoulder sling - Pain control with scheduled Tylenol  and as needed low-dose Oxy - dose of toradol - Follow-up with orthopedic surgery in the outpatient - plan from 10/15 notes sling immobilization full time for next 10 days, follow up for x rays - repeat imaging with increased angulation and displacement, ortho to follow up today   # Scalp Laceration - stapled, will need to come out 7-10 days (10/23-26)  # Degenerative disc disease - Back imaging shows degenerative changes in the L-spine and C-spine with signs of osteopenia - Pain control  as above  # Restless leg syndrome - Continue Requip  - Follow-up ferritin levels- wnl    # Anxiety and depression - Continue mirtazapine    # Hyperlipidemia - Continue rosuvastatin     DVT prophylaxis: lovenox  Code Status: full Family Communication: 2 daughters at bedside Disposition:   Status is: Observation The patient will require care spanning > 2 midnights and should be moved to inpatient  because: need for continued inpatient care   Consultants:    Procedures:  Echo IMPRESSIONS     1. Left ventricular ejection fraction, by estimation, is 60 to 65%. The  left ventricle has normal function. The left ventricle has no regional  wall motion abnormalities. Left ventricular diastolic parameters were  normal.   2. Right ventricular systolic function is normal. The right ventricular  size is normal. Tricuspid regurgitation signal is inadequate for assessing  PA pressure.   3. The mitral valve is normal in structure. Mild mitral valve  regurgitation. No evidence of mitral stenosis.   4. Tricuspid valve regurgitation is mild to moderate.   5. The aortic valve is normal in structure. Aortic valve regurgitation is  mild. No aortic stenosis is present.   6. There is mild dilatation of the ascending aorta, measuring 39 mm.   7. The inferior vena cava is normal in size with greater than 50%  respiratory variability, suggesting right atrial pressure of 3 mmHg.   Antimicrobials:  Anti-infectives (From admission, onward)    None       Subjective:  No new complaints C/o L arm pain  Objective: Vitals:   08/01/24 0428 08/01/24 0755 08/01/24 1121 08/01/24 1348  BP: 132/60 130/70 (!) 141/79 (!) 123/55  Pulse: (!) 115 (!) 122 (!) 126 (!) 128  Resp: 19 18 19 19   Temp: 97.7 F (36.5 C) 98 F (36.7 C) 98 F (36.7 C) 97.7 F (36.5 C)  TempSrc:  Oral Oral Oral  SpO2: 100% 98% 98% 99%  Weight:      Height:        Intake/Output Summary (Last 24 hours) at 08/01/2024 1434 Last data filed at 08/01/2024 1100 Gross per 24 hour  Intake --  Output 600 ml  Net -600 ml   Filed Weights   07/29/24 1300  Weight: 53.9 kg    Examination:  General: No acute distress. Cardiovascular: RRR Lungs: unlabored Abdomen: Soft, nontender, nondistended  Neurological: Alert and oriented 3. Moves all extremities 4 with equal strength. Cranial nerves II through XII grossly intact. L  arm in sling   Data Reviewed: I have personally reviewed following labs and imaging studies  CBC: Recent Labs  Lab 07/28/24 0459 07/28/24 1416 07/29/24 0449 07/30/24 0608 07/31/24 0730 08/01/24 0731  WBC 6.4  --  5.8 6.1 7.4 7.6  NEUTROABS  --   --  2.5  --  4.8 4.3  HGB 9.7* 9.6* 9.6* 9.5* 9.2* 9.0*  HCT 28.2* 28.3* 28.1* 28.3* 27.5* 26.4*  MCV 95.3  --  96.9 96.9 97.5 96.7  PLT 202  --  217 243 270 271    Basic Metabolic Panel: Recent Labs  Lab 07/28/24 0459 07/29/24 0449 07/30/24 0608 07/31/24 0730 08/01/24 0731  NA 138 137 138 137 136  K 3.4* 3.8 3.5 4.2 3.8  CL 104 105 103 103 101  CO2 21* 21* 20* 19* 22  GLUCOSE 71 77 77 95 97  BUN <5* <5* 5* 5* 8  CREATININE 0.53 0.49 0.49 0.55 0.42*  CALCIUM  8.4* 8.2* 8.6* 8.6* 8.6*  MG 2.0 2.0 1.9 1.8 1.9  PHOS 3.6 3.3 3.6 3.5 3.5    GFR: Estimated Creatinine Clearance: 42.3 mL/min (Shaterra Sanzone) (by C-G formula based on SCr of 0.42 mg/dL (L)).  Liver Function Tests: Recent Labs  Lab 07/27/24 1750 07/29/24 0449 07/31/24 0730 08/01/24 0731  AST 26 22 20 21   ALT 5 10 12 7   ALKPHOS 77 69 69 74  BILITOT 0.5 0.9 1.2 1.1  PROT 5.5* 5.1* 5.3* 5.7*  ALBUMIN 3.8 3.1* 3.1* 3.2*    CBG: No results for input(s): GLUCAP in the last 168 hours.   No results found for this or any previous visit (from the past 240 hours).       Radiology Studies: DG Humerus Left Result Date: 07/30/2024 CLINICAL DATA:  Pain after recent fall. EXAM: LEFT HUMERUS - 2+ VIEW; LEFT ELBOW - 2 VIEW COMPARISON:  Shoulder radiograph 07/27/2024 FINDINGS: Humerus: Displaced proximal humeral fracture demonstrates increased angulation and displacement from prior shoulder exam. Ileus 17 mm osseous overlap. No glenohumeral dislocation. The distal humerus is intact. Soft tissue edema is seen at the fracture site. Elbow: No elbow fracture or dislocation. No convincing elbow joint effusion. No significant arthropathy. IMPRESSION: 1. Displaced proximal humeral  fracture with increased angulation and displacement from prior shoulder exam. 2. No fracture of the distal humerus or elbow. Electronically Signed   By: Andrea Gasman M.D.   On: 07/30/2024 18:36   DG Elbow 2 Views Left Result Date: 07/30/2024 CLINICAL DATA:  Pain after recent fall. EXAM: LEFT HUMERUS - 2+ VIEW; LEFT ELBOW - 2 VIEW COMPARISON:  Shoulder radiograph 07/27/2024 FINDINGS: Humerus: Displaced proximal humeral fracture demonstrates increased angulation and displacement from prior shoulder exam. Ileus 17 mm osseous overlap. No glenohumeral dislocation. The distal humerus is intact. Soft tissue edema is seen at the fracture site. Elbow: No elbow fracture or dislocation. No convincing elbow joint effusion. No significant arthropathy. IMPRESSION: 1. Displaced proximal humeral fracture with increased angulation and displacement from prior shoulder exam. 2. No fracture of the distal humerus or elbow. Electronically Signed   By: Andrea Gasman M.D.   On: 07/30/2024 18:36        Scheduled Meds:  acetaminophen   1,000 mg Oral TID   carbidopa -levodopa   1 tablet Oral 5 X Daily   cholecalciferol  400 Units Oral Daily   enoxaparin  (LOVENOX ) injection  30 mg Subcutaneous Q24H   mirtazapine   15 mg Oral QHS   polyethylene glycol  17 g Oral BID   rOPINIRole   4 mg Oral QHS   rosuvastatin   5 mg Oral Daily   Continuous Infusions:   LOS: 4 days    Time spent: over 30 min     Meliton Monte, MD [facility]   To contact the attending provider between 7A-7P or the covering provider during after hours 7P-7A, please log into the web site [email or web address] and access using universal [place] password for that web site. If you do not have the password, please call the hospital operator.  08/01/2024, 2:34 PM

## 2024-08-01 NOTE — Progress Notes (Signed)
 Patient insists on taking her sinemet  earlier than schedule, patient is very anxious, explained to her that her last dose was at 1600 and that it is important for her to take her medications as prescribed by the doctor on its prescribed time to have maximal effect.

## 2024-08-02 DIAGNOSIS — S42292D Other displaced fracture of upper end of left humerus, subsequent encounter for fracture with routine healing: Secondary | ICD-10-CM | POA: Diagnosis not present

## 2024-08-02 LAB — CBC WITH DIFFERENTIAL/PLATELET
Abs Immature Granulocytes: 0.05 K/uL (ref 0.00–0.07)
Basophils Absolute: 0.1 K/uL (ref 0.0–0.1)
Basophils Relative: 1 %
Eosinophils Absolute: 0.3 K/uL (ref 0.0–0.5)
Eosinophils Relative: 5 %
HCT: 24 % — ABNORMAL LOW (ref 36.0–46.0)
Hemoglobin: 8 g/dL — ABNORMAL LOW (ref 12.0–15.0)
Immature Granulocytes: 1 %
Lymphocytes Relative: 23 %
Lymphs Abs: 1.6 K/uL (ref 0.7–4.0)
MCH: 32.8 pg (ref 26.0–34.0)
MCHC: 33.3 g/dL (ref 30.0–36.0)
MCV: 98.4 fL (ref 80.0–100.0)
Monocytes Absolute: 0.7 K/uL (ref 0.1–1.0)
Monocytes Relative: 10 %
Neutro Abs: 4.2 K/uL (ref 1.7–7.7)
Neutrophils Relative %: 60 %
Platelets: 281 K/uL (ref 150–400)
RBC: 2.44 MIL/uL — ABNORMAL LOW (ref 3.87–5.11)
RDW: 15 % (ref 11.5–15.5)
WBC: 7.1 K/uL (ref 4.0–10.5)
nRBC: 0 % (ref 0.0–0.2)

## 2024-08-02 LAB — COMPREHENSIVE METABOLIC PANEL WITH GFR
ALT: 6 U/L (ref 0–44)
AST: 25 U/L (ref 15–41)
Albumin: 2.9 g/dL — ABNORMAL LOW (ref 3.5–5.0)
Alkaline Phosphatase: 75 U/L (ref 38–126)
Anion gap: 9 (ref 5–15)
BUN: 8 mg/dL (ref 8–23)
CO2: 24 mmol/L (ref 22–32)
Calcium: 8.5 mg/dL — ABNORMAL LOW (ref 8.9–10.3)
Chloride: 102 mmol/L (ref 98–111)
Creatinine, Ser: 0.5 mg/dL (ref 0.44–1.00)
GFR, Estimated: >60 mL/min (ref >60)
Glucose, Bld: 85 mg/dL (ref 70–99)
Potassium: 3.9 mmol/L (ref 3.5–5.1)
Sodium: 135 mmol/L (ref 135–145)
Total Bilirubin: 1.2 mg/dL (ref 0.0–1.2)
Total Protein: 5.2 g/dL — ABNORMAL LOW (ref 6.5–8.1)

## 2024-08-02 LAB — HEMOGLOBIN AND HEMATOCRIT, BLOOD
HCT: 24.6 % — ABNORMAL LOW (ref 36.0–46.0)
Hemoglobin: 8.2 g/dL — ABNORMAL LOW (ref 12.0–15.0)

## 2024-08-02 LAB — ABO/RH: ABO/RH(D): A POS

## 2024-08-02 LAB — SURGICAL PCR SCREEN
MRSA, PCR: NEGATIVE
Staphylococcus aureus: NEGATIVE

## 2024-08-02 LAB — MAGNESIUM: Magnesium: 1.9 mg/dL (ref 1.7–2.4)

## 2024-08-02 LAB — PREPARE RBC (CROSSMATCH)

## 2024-08-02 LAB — PHOSPHORUS: Phosphorus: 3.6 mg/dL (ref 2.5–4.6)

## 2024-08-02 MED ORDER — OXYCODONE HCL 5 MG PO TABS
2.5000 mg | ORAL_TABLET | ORAL | Status: DC | PRN
  Administered 2024-08-02 – 2024-08-05 (×12): 5 mg via ORAL
  Filled 2024-08-02 (×12): qty 1

## 2024-08-02 MED ORDER — MUPIROCIN 2 % EX OINT
1.0000 | TOPICAL_OINTMENT | Freq: Two times a day (BID) | CUTANEOUS | Status: DC
  Administered 2024-08-02 – 2024-08-05 (×7): 1 via NASAL
  Filled 2024-08-02 (×2): qty 22

## 2024-08-02 MED ORDER — SODIUM CHLORIDE 0.9% IV SOLUTION: Freq: Once | INTRAVENOUS | Status: AC

## 2024-08-02 MED ORDER — LORAZEPAM 0.5 MG PO TABS
0.2500 mg | ORAL_TABLET | Freq: Every day | ORAL | Status: DC | PRN
  Administered 2024-08-02 – 2024-08-04 (×2): 0.25 mg via ORAL
  Filled 2024-08-02 (×2): qty 1

## 2024-08-02 NOTE — Care Management Important Message (Signed)
 Important Message  Patient Details  Name: Shannon Sutton MRN: [id number] Date of Birth: May 14, 1946   Important Message Given:  Yes - Medicare IM     Jon Cruel 08/02/2024, 4:19 PM

## 2024-08-02 NOTE — Plan of Care (Signed)
  Problem: Education: Goal: Knowledge of General Education information will improve Description: Including pain rating scale, medication(s)/side effects and non-pharmacologic comfort measures Outcome: Not Progressing   Problem: Clinical Measurements: Goal: Ability to maintain clinical measurements within normal limits will improve Outcome: Not Progressing   Problem: Clinical Measurements: Goal: Cardiovascular complication will be avoided Outcome: Not Progressing   Problem: Activity: Goal: Risk for activity intolerance will decrease Outcome: Not Progressing   Problem: Coping: Goal: Level of anxiety will decrease Outcome: Not Progressing   Problem: Elimination: Goal: Will not experience complications related to bowel motility Outcome: Not Progressing   Problem: Pain Managment: Goal: General experience of comfort will improve and/or be controlled Outcome: Not Progressing   Problem: Safety: Goal: Ability to remain free from injury will improve Outcome: Not Progressing   Problem: Skin Integrity: Goal: Risk for impaired skin integrity will decrease Outcome: Not Progressing

## 2024-08-02 NOTE — Plan of Care (Signed)
  Problem: Education: Goal: Knowledge of General Education information will improve Description: Including pain rating scale, medication(s)/side effects and non-pharmacologic comfort measures Outcome: Progressing   Problem: Health Behavior/Discharge Planning: Goal: Ability to manage health-related needs will improve Outcome: Not Progressing   Problem: Clinical Measurements: Goal: Will remain free from infection Outcome: Progressing Goal: Diagnostic test results will improve Outcome: Progressing Goal: Cardiovascular complication will be avoided Outcome: Progressing   Problem: Activity: Goal: Risk for activity intolerance will decrease Outcome: Not Progressing   Problem: Pain Managment: Goal: General experience of comfort will improve and/or be controlled Outcome: Not Progressing   Problem: Coping: Goal: Level of anxiety will decrease Outcome: Not Progressing   Problem: Skin Integrity: Goal: Risk for impaired skin integrity will decrease Outcome: Progressing

## 2024-08-02 NOTE — Progress Notes (Addendum)
 PROGRESS NOTE    Shannon Sutton  FMW:[id number] DOB: 1946-10-02 DOA: 07/27/2024 PCP: Theophilus Andrews, Tully GRADE, MD  Chief Complaint  Patient presents with   Fall   Weakness    Brief Narrative:   Shannon Sutton is Shannon Sutton 78 y.o. female with medical history significant for Parkinson's disease, anxiety and depression, fibromyalgia, HTN, HLD, restless leg syndrome, urinary incontinence and arthritis who presented to [facility] for evaluation of fall and weakness and admitted for further evaluation.   Suspect her falls are related to her parkinson's and recently increased dose of sinemet .  Workup here notable for orthostatic hypotension which can be related to both parkinson's and sinemet .    Hospitalizatin c/b by Shannon Sutton refusing sinemet  due to concern it was causing adverse effects.  Now she thinks it's helping her and is taking it (sounds like she does this at home as well).  We've discussed risks of sudden dose changes.  Repeat imaging showed more displacement and angulation of the humerus fracture.  Orthopedics planning to treat inpatient, 10/22.    Assessment & Plan:   Principal Problem:   Fall Active Problems:   Near syncope   Anemia   Closed fracture of proximal end of left humerus with routine healing   Parkinson's disease (HCC)   Hypokalemia   Generalized weakness  # Fall # Generalized weakness # Orthostatic Hypotension  # Parkinson's Disease - increased falls in the past few weeks.  In setting of her parkinson's disease.  Also, recent increase in dose of her sinemet  (from 1 pill 5 times Sayge Salvato day to 1.5 pills 4 times daily and 1 pill nightly - daughter showed me [name] mychart message where they decided to trial this with her outpatient neurologist [name] few weeks ago).  She's orthostatic, which I suspect is related to parkinsons +/- sinemet .  Also with new anemia, but I doubt this is playing [name] major role given stability and mild anemia with hb in 9's at presentation. - echo with  preserved EF, no AS - daily orthostatics  - compression stockings, abdominal binder, change positions slowly, elevated HOB - TSH wnl - cortisol 18.3 (10 am) - PT/OT eval and treat - Fall precautions - given increasing falls after increasing sinemet  dose, will resume dose from [name] few weeks ago (25-100 mg 5 times daily) - she's not taking sinemet  as directed.  Refused most doses from 10/16-10/20 due to concern that she was having adverse symptoms related to this (we discussed risks of sudden discontinuation/withdrawal).  Now she's asking for it more frequently.  Advised she take this as prescribed, but per discussion with daughter, sounds like this is what she does at home as well.  - appreciate neurology assistance - recommending continued sinemet   # Sinus Tachycardia - persistent - related to withdrawal of sinemet ? - EKG with sinus tach - TSH wnl  - CT PE protocol without PE - will monitor for now   # Pleuritic Chest Pain - related to rib fractures? - CT PE protocol reassuring - seems resolved/improved  # Normocytic Anemia - Hgb of 8.6 on admission from baseline of 12-13 - fluctuating, 8.2 today, down from around 9 earlier in admission - will consider transfusion with her orthostasis, tachycardia, and pending surgery (will discuss with patient's daughters - discussed with shelby, agreeable, with transfuse 1 unit pRBC) - labs consistent with low normal iron.  Normal b12. Folate wnl. - follow retic (inadequate) - warrants additional outpatient follow up for her new anemia - may  need heme follow up   # Hypokalemia - replace and follow  # Left humerus fracture - Patient had follow-up with orthopedic in the outpatient on day of admission - Repeat x-ray shows displaced proximal humeral fracture with increased angulation and displacement from prior exam - Continue left shoulder sling - Pain control with scheduled Tylenol  and as needed low-dose Oxy - dose of toradol - ortho planning  surgery sometime tomorrow afternoon, 10/22   # Scalp Laceration - stapled, will need to come out 7-10 days (10/23-26)  # R Posterior 9-11 rib fracture - noted on CT scan   # Degenerative disc disease - Back imaging shows degenerative changes in the L-spine and C-spine with signs of osteopenia - Pain control as above  # Restless leg syndrome - Continue Requip  - Follow-up ferritin levels- wnl    # Anxiety and depression - Continue mirtazapine    # Hyperlipidemia - Continue rosuvastatin     DVT prophylaxis: lovenox  Code Status: full -> daughter, [name] notes Shannon Sutton has DNR (will clarify DNI) Family Communication: daughter over phone Disposition:   Status is: Observation The patient will require care spanning > 2 midnights and should be moved to inpatient because: need for continued inpatient care   Consultants:    Procedures:  Echo IMPRESSIONS     1. Left ventricular ejection fraction, by estimation, is 60 to 65%. The  left ventricle has normal function. The left ventricle has no regional  wall motion abnormalities. Left ventricular diastolic parameters were  normal.   2. Right ventricular systolic function is normal. The right ventricular  size is normal. Tricuspid regurgitation signal is inadequate for assessing  PA pressure.   3. The mitral valve is normal in structure. Mild mitral valve  regurgitation. No evidence of mitral stenosis.   4. Tricuspid valve regurgitation is mild to moderate.   5. The aortic valve is normal in structure. Aortic valve regurgitation is  mild. No aortic stenosis is present.   6. There is mild dilatation of the ascending aorta, measuring 39 mm.   7. The inferior vena cava is normal in size with greater than 50%  respiratory variability, suggesting right atrial pressure of 3 mmHg.   Antimicrobials:  Anti-infectives (From admission, onward)    None       Subjective:  C/o continued L arm pain Now feels like sinemet  is  helping  Objective: Vitals:   08/02/24 0022 08/02/24 0513 08/02/24 0755 08/02/24 1340  BP: (!) 111/54 (!) 126/57 124/65 129/65  Pulse: (!) 110 (!) 106    Resp: 19 17 15 16   Temp: 98.2 F (36.8 C) 97.7 F (36.5 C) 98.3 F (36.8 C) 98.1 F (36.7 C)  TempSrc: Oral Oral Oral Oral  SpO2: 96% 99% 97% 99%  Weight:      Height:        Intake/Output Summary (Last 24 hours) at 08/02/2024 1431 Last data filed at 08/02/2024 0900 Gross per 24 hour  Intake 220 ml  Output 300 ml  Net -80 ml   Filed Weights   07/29/24 1300  Weight: 53.9 kg    Examination:  General: No acute distress. Cardiovascular: sinus tachy Lungs: unlabored Abdomen: Soft, nontender, nondistended Neurological: Alert and oriented 3. Moves all extremities 4 with equal strength. Cranial nerves II through XII grossly intact. Extremities: LUE swelling   Data Reviewed: I have personally reviewed following labs and imaging studies  CBC: Recent Labs  Lab 07/29/24 0449 07/30/24 9391 07/31/24 0730 08/01/24 0731 08/02/24 0459 08/02/24 1111  WBC 5.8 6.1 7.4 7.6 7.1  --   NEUTROABS 2.5  --  4.8 4.3 4.2  --   HGB 9.6* 9.5* 9.2* 9.0* 8.0* 8.2*  HCT 28.1* 28.3* 27.5* 26.4* 24.0* 24.6*  MCV 96.9 96.9 97.5 96.7 98.4  --   PLT 217 243 270 271 281  --     Basic Metabolic Panel: Recent Labs  Lab 07/29/24 0449 07/30/24 0608 07/31/24 0730 08/01/24 0731 08/02/24 0500  NA 137 138 137 136 135  K 3.8 3.5 4.2 3.8 3.9  CL 105 103 103 101 102  CO2 21* 20* 19* 22 24  GLUCOSE 77 77 95 97 85  BUN <5* 5* 5* 8 8  CREATININE 0.49 0.49 0.55 0.42* 0.50  CALCIUM  8.2* 8.6* 8.6* 8.6* 8.5*  MG 2.0 1.9 1.8 1.9 1.9  PHOS 3.3 3.6 3.5 3.5 3.6    GFR: Estimated Creatinine Clearance: 42.3 mL/min (by C-G formula based on SCr of 0.5 mg/dL).  Liver Function Tests: Recent Labs  Lab 07/27/24 1750 07/29/24 0449 07/31/24 0730 08/01/24 0731 08/02/24 0500  AST 26 22 20 21 25   ALT 5 10 12 7 6   ALKPHOS 77 69 69 74 75   BILITOT 0.5 0.9 1.2 1.1 1.2  PROT 5.5* 5.1* 5.3* 5.7* 5.2*  ALBUMIN 3.8 3.1* 3.1* 3.2* 2.9*    CBG: No results for input(s): GLUCAP in the last 168 hours.   No results found for this or any previous visit (from the past 240 hours).       Radiology Studies: No results found.       Scheduled Meds:  acetaminophen   1,000 mg Oral TID   carbidopa -levodopa   1 tablet Oral 5 X Daily   cholecalciferol  400 Units Oral Daily   mirtazapine   15 mg Oral QHS   mupirocin ointment  1 Application Nasal BID   polyethylene glycol  17 g Oral BID   rOPINIRole   4 mg Oral QHS   rosuvastatin   5 mg Oral Daily   Continuous Infusions:   LOS: 5 days    Time spent: over 30 min     Meliton Monte, MD [facility]   To contact the attending provider between 7A-7P or the covering provider during after hours 7P-7A, please log into the web site [email or web address] and access using universal [place] password for that web site. If you do not have the password, please call the hospital operator.  08/02/2024, 2:31 PM

## 2024-08-02 NOTE — Progress Notes (Signed)
 OT Cancellation Note  Patient Details Name: Shannon Sutton MRN: [id number] DOB: [birth year]   Cancelled Treatment:    Reason Eval/Treat Not Completed: Other (comment) Pt declining therapy this morning d/t pain and fatigue, requested attempt later this date. OT to continue POC and follow-up as appropriate and as schedule allows.   Maurilio CROME, OTR/LSABRA  [facility]  Office: [phone]   Maurilio PARAS Tytan Sandate 08/02/2024, 9:13 AM

## 2024-08-02 NOTE — Progress Notes (Signed)
 Patient with left proximal humerus fracture which was well aligned on 13 October. Had a fall and fracture became significantly malaligned 2 days later. Currently displacement is increasing and nonunion is extremely likely. She has diminished function because of rotator cuff arthropathy on the right Best chance for a reasonable functional outcome in this scenario would be open reduction internal fixation of the left proximal humerus fracture.  Risk and benefits are discussed with her and her daughter including not limited to infection or vessel damage incomplete pain relief as well as a long recovery of better function but not normal function.  Patient understands the risk and benefits and wishes to proceed.  Lovenox  given at 9 AM this morning.  We will hold the Lovenox  tomorrow which will be longer than 24 hours and plan on surgery sometime around 4:00 in the afternoon.  All questions answered n.p.o. after midnight

## 2024-08-03 ENCOUNTER — Inpatient Hospital Stay (HOSPITAL_COMMUNITY): Admitting: Anesthesiology

## 2024-08-03 ENCOUNTER — Inpatient Hospital Stay (HOSPITAL_COMMUNITY)

## 2024-08-03 ENCOUNTER — Encounter (HOSPITAL_COMMUNITY)
Admission: EM | Disposition: A | Discharge status: Home / Self Care | Payer: Self-pay | Source: Skilled Nursing Facility | Attending: Family Medicine

## 2024-08-03 ENCOUNTER — Encounter (HOSPITAL_COMMUNITY): Payer: Self-pay | Admitting: Internal Medicine

## 2024-08-03 DIAGNOSIS — S42302A Unspecified fracture of shaft of humerus, left arm, initial encounter for closed fracture: Secondary | ICD-10-CM

## 2024-08-03 DIAGNOSIS — D649 Anemia, unspecified: Secondary | ICD-10-CM | POA: Diagnosis not present

## 2024-08-03 DIAGNOSIS — S42292A Other displaced fracture of upper end of left humerus, initial encounter for closed fracture: Secondary | ICD-10-CM

## 2024-08-03 DIAGNOSIS — S42292D Other displaced fracture of upper end of left humerus, subsequent encounter for fracture with routine healing: Secondary | ICD-10-CM | POA: Diagnosis not present

## 2024-08-03 HISTORY — PX: ORIF HUMERUS FRACTURE: SHX2126

## 2024-08-03 LAB — POCT I-STAT EG7
Acid-base deficit: 2 mmol/L (ref 0.0–2.0)
Bicarbonate: 21.7 mmol/L (ref 20.0–28.0)
Calcium, Ion: 1.21 mmol/L (ref 1.15–1.40)
HCT: 24 % — ABNORMAL LOW (ref 36.0–46.0)
Hemoglobin: 8.2 g/dL — ABNORMAL LOW (ref 12.0–15.0)
O2 Saturation: 93 %
Patient temperature: 36.3
Potassium: 3.1 mmol/L — ABNORMAL LOW (ref 3.5–5.1)
Sodium: 136 mmol/L (ref 135–145)
TCO2: 23 mmol/L (ref 22–32)
pCO2, Ven: 32.6 mmHg — ABNORMAL LOW (ref 44–60)
pH, Ven: 7.428 (ref 7.25–7.43)
pO2, Ven: 62 mmHg — ABNORMAL HIGH (ref 32–45)

## 2024-08-03 LAB — PREPARE RBC (CROSSMATCH)

## 2024-08-03 SURGERY — OPEN REDUCTION INTERNAL FIXATION (ORIF) PROXIMAL HUMERUS FRACTURE
Anesthesia: General | Laterality: Left

## 2024-08-03 MED ORDER — PHENYLEPHRINE 80 MCG/ML (10ML) SYRINGE FOR IV PUSH (FOR BLOOD PRESSURE SUPPORT)
PREFILLED_SYRINGE | INTRAVENOUS | Status: DC | PRN
  Administered 2024-08-03: 80 ug via INTRAVENOUS
  Administered 2024-08-03 (×3): 160 ug via INTRAVENOUS
  Administered 2024-08-03 (×2): 80 ug via INTRAVENOUS

## 2024-08-03 MED ORDER — ASPIRIN 81 MG PO CHEW
81.0000 mg | CHEWABLE_TABLET | Freq: Two times a day (BID) | ORAL | Status: DC
  Administered 2024-08-04 – 2024-08-05 (×2): 81 mg via ORAL
  Filled 2024-08-03 (×3): qty 1

## 2024-08-03 MED ORDER — PROPOFOL 10 MG/ML IV BOLUS
INTRAVENOUS | Status: AC
  Filled 2024-08-03: qty 20

## 2024-08-03 MED ORDER — ALUM & MAG HYDROXIDE-SIMETH 200-200-20 MG/5ML PO SUSP: 30.0000 mL | ORAL | Status: DC | PRN

## 2024-08-03 MED ORDER — LORAZEPAM 0.5 MG PO TABS
0.2500 mg | ORAL_TABLET | Freq: Once | ORAL | Status: AC
  Administered 2024-08-03: 0.25 mg via ORAL
  Filled 2024-08-03: qty 1

## 2024-08-03 MED ORDER — ALBUMIN HUMAN 5 % IV SOLN: INTRAVENOUS | Status: DC | PRN

## 2024-08-03 MED ORDER — VANCOMYCIN HCL 1000 MG IV SOLR
INTRAVENOUS | Status: AC
  Filled 2024-08-03: qty 20

## 2024-08-03 MED ORDER — FENTANYL CITRATE (PF) 100 MCG/2ML IJ SOLN
INTRAMUSCULAR | Status: AC
  Filled 2024-08-03: qty 2

## 2024-08-03 MED ORDER — SODIUM CHLORIDE 0.9 % IV SOLN
10.0000 mL/h | Freq: Once | INTRAVENOUS | Status: AC
  Administered 2024-08-03: 10 mL/h via INTRAVENOUS

## 2024-08-03 MED ORDER — DEXAMETHASONE SOD PHOSPHATE PF 10 MG/ML IJ SOLN
INTRAMUSCULAR | Status: DC | PRN
  Administered 2024-08-03: 5 mg via INTRAVENOUS

## 2024-08-03 MED ORDER — BUPIVACAINE LIPOSOME 1.3 % IJ SUSP
INTRAMUSCULAR | Status: DC | PRN
  Administered 2024-08-03: 10 mL

## 2024-08-03 MED ORDER — PHENYLEPHRINE 80 MCG/ML (10ML) SYRINGE FOR IV PUSH (FOR BLOOD PRESSURE SUPPORT)
PREFILLED_SYRINGE | INTRAVENOUS | Status: AC
  Filled 2024-08-03: qty 10

## 2024-08-03 MED ORDER — LIDOCAINE 2% (20 MG/ML) 5 ML SYRINGE
INTRAMUSCULAR | Status: DC | PRN
  Administered 2024-08-03: 20 mg via INTRAVENOUS

## 2024-08-03 MED ORDER — CHLORHEXIDINE GLUCONATE 0.12 % MT SOLN
15.0000 mL | Freq: Once | OROMUCOSAL | Status: AC
  Administered 2024-08-03: 15 mL via OROMUCOSAL

## 2024-08-03 MED ORDER — VANCOMYCIN HCL 1000 MG IV SOLR
INTRAVENOUS | Status: DC | PRN
  Administered 2024-08-03: 1000 mg via TOPICAL

## 2024-08-03 MED ORDER — BUPIVACAINE HCL 0.25 % IJ SOLN: INTRAMUSCULAR | Status: DC | PRN

## 2024-08-03 MED ORDER — LACTATED RINGERS IV SOLN: INTRAVENOUS | Status: DC

## 2024-08-03 MED ORDER — SODIUM CHLORIDE 0.9% IV SOLUTION: Freq: Once | INTRAVENOUS | Status: DC

## 2024-08-03 MED ORDER — PROPOFOL 10 MG/ML IV BOLUS
INTRAVENOUS | Status: DC | PRN
  Administered 2024-08-03: 80 mg via INTRAVENOUS

## 2024-08-03 MED ORDER — PHENYLEPHRINE HCL-NACL 20-0.9 MG/250ML-% IV SOLN
INTRAVENOUS | Status: DC | PRN
  Administered 2024-08-03: 30 ug/min via INTRAVENOUS

## 2024-08-03 MED ORDER — ROCURONIUM BROMIDE 10 MG/ML (PF) SYRINGE
PREFILLED_SYRINGE | INTRAVENOUS | Status: DC | PRN
  Administered 2024-08-03: 30 mg via INTRAVENOUS

## 2024-08-03 MED ORDER — BUPIVACAINE HCL (PF) 0.25 % IJ SOLN
INTRAMUSCULAR | Status: AC
  Filled 2024-08-03: qty 30

## 2024-08-03 MED ORDER — SENNOSIDES-DOCUSATE SODIUM 8.6-50 MG PO TABS
2.0000 | ORAL_TABLET | Freq: Every day | ORAL | Status: DC
  Administered 2024-08-03: 2 via ORAL
  Filled 2024-08-03: qty 2

## 2024-08-03 MED ORDER — CHLORHEXIDINE GLUCONATE 4 % EX SOLN
60.0000 mL | Freq: Once | CUTANEOUS | Status: AC
  Administered 2024-08-03: 4 via TOPICAL
  Filled 2024-08-03: qty 60

## 2024-08-03 MED ORDER — BUPIVACAINE HCL (PF) 0.5 % IJ SOLN
INTRAMUSCULAR | Status: DC | PRN
  Administered 2024-08-03: 10 mL

## 2024-08-03 MED ORDER — CEFAZOLIN SODIUM-DEXTROSE 2-4 GM/100ML-% IV SOLN
2.0000 g | INTRAVENOUS | Status: AC
  Administered 2024-08-03: 2 g via INTRAVENOUS
  Filled 2024-08-03: qty 100

## 2024-08-03 MED ORDER — 0.9 % SODIUM CHLORIDE (POUR BTL) OPTIME
TOPICAL | Status: DC | PRN
  Administered 2024-08-03: 2000 mL
  Administered 2024-08-03: 1000 mL

## 2024-08-03 MED ORDER — TRANEXAMIC ACID-NACL 1000-0.7 MG/100ML-% IV SOLN
1000.0000 mg | INTRAVENOUS | Status: AC
  Administered 2024-08-03: 1000 mg via INTRAVENOUS
  Filled 2024-08-03: qty 100

## 2024-08-03 MED ORDER — FENTANYL CITRATE (PF) 250 MCG/5ML IJ SOLN
INTRAMUSCULAR | Status: DC | PRN
  Administered 2024-08-03: 25 ug via INTRAVENOUS
  Administered 2024-08-03: 50 ug via INTRAVENOUS
  Administered 2024-08-03: 25 ug via INTRAVENOUS

## 2024-08-03 MED ORDER — SENNOSIDES-DOCUSATE SODIUM 8.6-50 MG PO TABS: 1.0000 | ORAL_TABLET | ORAL | Status: DC | PRN

## 2024-08-03 MED ORDER — ROCURONIUM BROMIDE 10 MG/ML (PF) SYRINGE
PREFILLED_SYRINGE | INTRAVENOUS | Status: AC
  Filled 2024-08-03: qty 10

## 2024-08-03 MED ORDER — POVIDONE-IODINE 10 % EX SWAB: 2.0000 | Freq: Once | CUTANEOUS | Status: DC

## 2024-08-03 MED ORDER — SERTRALINE HCL 100 MG PO TABS
100.0000 mg | ORAL_TABLET | Freq: Every day | ORAL | Status: DC
  Administered 2024-08-03 – 2024-08-05 (×2): 100 mg via ORAL
  Filled 2024-08-03 (×2): qty 1

## 2024-08-03 MED ORDER — SODIUM CHLORIDE 0.9 % IV SOLN: INTRAVENOUS | Status: DC

## 2024-08-03 MED ORDER — IRRISEPT - 450ML BOTTLE WITH 0.05% CHG IN STERILE WATER, USP 99.95% OPTIME
TOPICAL | Status: DC | PRN
  Administered 2024-08-03: 450 mL

## 2024-08-03 MED ORDER — CALCIUM CARBONATE ANTACID 500 MG PO CHEW
1.0000 | CHEWABLE_TABLET | Freq: Four times a day (QID) | ORAL | Status: DC | PRN
  Administered 2024-08-03: 200 mg via ORAL
  Filled 2024-08-03: qty 1

## 2024-08-03 SURGICAL SUPPLY — 58 items
ALCOHOL 70% 16 OZ (MISCELLANEOUS) ×1 IMPLANT
BAG COUNTER SPONGE SURGICOUNT (BAG) ×1 IMPLANT
BENZOIN TINCTURE PRP APPL 2/3 (GAUZE/BANDAGES/DRESSINGS) ×1 IMPLANT
BIT DRILL 3.2XCALB NS DISP (BIT) IMPLANT
BIT DRILL CALIBRATED 2.7 (BIT) IMPLANT
BIT DRILL MID 1.8 ZI (BIT) IMPLANT
BNDG COHESIVE 4X5 TAN STRL LF (GAUZE/BANDAGES/DRESSINGS) ×1 IMPLANT
CHLORAPREP W/TINT 26 (MISCELLANEOUS) ×2 IMPLANT
CLSR STERI-STRIP ANTIMIC 1/2X4 (GAUZE/BANDAGES/DRESSINGS) IMPLANT
COOLER ICEMAN CLASSIC (MISCELLANEOUS) IMPLANT
COVER SURGICAL LIGHT HANDLE (MISCELLANEOUS) ×1 IMPLANT
DRAPE IMP U-DRAPE 54X76 (DRAPES) ×1 IMPLANT
DRAPE U-SHAPE 47X51 STRL (DRAPES) ×2 IMPLANT
DRESSING AQUACEL AG SP 3.5X10 (GAUZE/BANDAGES/DRESSINGS) IMPLANT
DRIVER RETENTION T8 ZI (ORTHOPEDIC DISPOSABLE SUPPLIES) IMPLANT
ELECTRODE REM PT RTRN 9FT ADLT (ELECTROSURGICAL) ×1 IMPLANT
FACESHIELD WRAPAROUND OR TEAM (MASK) ×1 IMPLANT
GAUZE SPONGE 4X4 12PLY STRL (GAUZE/BANDAGES/DRESSINGS) ×2 IMPLANT
GLOVE BIOGEL PI IND STRL 8 (GLOVE) ×1 IMPLANT
GLOVE ECLIPSE 8.0 STRL XLNG CF (GLOVE) ×1 IMPLANT
GOWN STRL REUS W/ TWL LRG LVL3 (GOWN DISPOSABLE) ×2 IMPLANT
GOWN STRL REUS W/ TWL XL LVL3 (GOWN DISPOSABLE) ×1 IMPLANT
GRAFT BNE CANC CHIPS 1-8 20CC (Bone Implant) IMPLANT
HYDROGEN PEROXIDE 16OZ (MISCELLANEOUS) ×1 IMPLANT
KIT BASIN OR (CUSTOM PROCEDURE TRAY) ×1 IMPLANT
KIT TURNOVER KIT B (KITS) ×1 IMPLANT
KWIRE 2X5 SS THRDED S3 (WIRE) IMPLANT
MANIFOLD NEPTUNE II (INSTRUMENTS) ×1 IMPLANT
PACK SHOULDER (CUSTOM PROCEDURE TRAY) ×1 IMPLANT
PAD ARMBOARD POSITIONER FOAM (MISCELLANEOUS) ×2 IMPLANT
PAD COLD SHLDR WRAP-ON (PAD) IMPLANT
PEG LOCKING 3.2MMX44 (Peg) IMPLANT
PEG LOCKING 3.2MMX46 (Peg) IMPLANT
PEG LOCKING 3.2MMX54 (Peg) IMPLANT
PEG LOCKING 3.2MMX56 (Peg) IMPLANT
PEG LOCKING 3.2X40 (Peg) IMPLANT
PLATE PROX HUMERUS HI LT 4H (Plate) IMPLANT
PLATE STRAIGHT 2.4 4H (Plate) IMPLANT
SCREW CORTICAL LOW PROF 3.5X34 (Screw) IMPLANT
SCREW LOCK CANC STAR 4X30 (Screw) IMPLANT
SCREW LOCK CANC STAR 4X34 (Screw) IMPLANT
SCREW LOCK CANC STAR 4X38 (Screw) IMPLANT
SCREW LP NL T15 3.5X22 (Screw) IMPLANT
SCREW LP NL T15 3.5X24 (Screw) IMPLANT
SCREW NLOCK 2.4X6 (Screw) IMPLANT
SCREW NLOCK 2.4X7 (Screw) IMPLANT
SCREW T15 LP CORT 3.5X38MM NS (Screw) IMPLANT
SCREW T15 LP CORT 3.5X42MM NS (Screw) IMPLANT
SOLN 0.9% NACL POUR BTL 1000ML (IV SOLUTION) ×1 IMPLANT
SOLN STERILE WATER BTL 1000 ML (IV SOLUTION) ×1 IMPLANT
SPONGE T-LAP 18X18 ~~LOC~~+RFID (SPONGE) ×1 IMPLANT
SUT MNCRL AB 3-0 PS2 18 (SUTURE) IMPLANT
SUT VIC AB 0 CT1 27XBRD ANBCTR (SUTURE) IMPLANT
SUT VIC AB 1 CT1 27XBRD ANBCTR (SUTURE) IMPLANT
SUT VIC AB 2-0 CT2 27 (SUTURE) IMPLANT
SUT VICRYL 0 UR6 27IN ABS (SUTURE) IMPLANT
TOWEL GREEN STERILE (TOWEL DISPOSABLE) ×1 IMPLANT
TOWEL GREEN STERILE FF (TOWEL DISPOSABLE) ×1 IMPLANT

## 2024-08-03 NOTE — Brief Op Note (Signed)
   08/03/2024  8:18 PM  PATIENT:  Clarita CHRISTELLA Elbe  78 y.o. female  PRE-OPERATIVE DIAGNOSIS:  left humerus fracture  POST-OPERATIVE DIAGNOSIS:  left humerus fracture  PROCEDURE:  Procedure(s): OPEN REDUCTION INTERNAL FIXATION (ORIF) PROXIMAL HUMERUS FRACTURE  SURGEON:  Surgeon(s): Addie, Cordella Hamilton, MD  ASSISTANT: Herlene Calix, PA  ANESTHESIA:   General  EBL: 100 ml    Total I/O In: 1220 [I.V.:900; Blood:320] Out: 50 [Blood:50]  BLOOD ADMINISTERED: 1 unit packed cells  DRAINS: None  LOCAL MEDICATIONS USED: Vanco powder  SPECIMEN:  No Specimen  COUNTS:  YES  TOURNIQUET:  * No tourniquets in log *  DICTATION: .Other Dictation: Dictation Number [id number]  PLAN OF CARE: Admit to inpatient   PATIENT DISPOSITION:  PACU - hemodynamically stable             Herlene Calix, [place]

## 2024-08-03 NOTE — Op Note (Unsigned)
 NAME: Shannon Sutton, Shannon Sutton MEDICAL RECORD NO: [id number] ACCOUNT NO: [id number] DATE OF BIRTH: Aug 25, 1946 FACILITY: [facility] LOCATION: [facility] PHYSICIAN: Cordella RAMAN. Addie, MD  Operative Report   DATE OF PROCEDURE: 08/03/2024  PREOPERATIVE DIAGNOSIS:  2-part proximal humerus fracture, displaced, left.  POSTOPERATIVE DIAGNOSIS:  2-part proximal humerus fracture, displaced, left.  PROCEDURE:  Open reduction and internal fixation of 2-part proximal humerus fracture using Biomet proximal humerus plate.  SURGEON:  Cordella RAMAN. Addie, MD  ASSISTANT:  Herlene Calix, PA  INDICATIONS:  The patient is an 78 year old patient with displaced proximal humerus fracture on the left and significant rotator cuff arthropathy on the right shoulder who presents now for operative management after expectations of risks and benefits.  DESCRIPTION OF PROCEDURE:  The patient was brought to the operating room where general endotracheal anesthesia was induced.  Preoperative antibiotic administered.  Timeout was called.  The patient was placed on the Hana bed so we could elevate on an  incline her body to about 15 degrees.  Left shoulder was prescrubbed with alcohol and Betadine, allowed to air dry, prepped with DuraPrep solution, and draped in a sterile manner.  Ioban used to seal the operative field, cover the axilla, and then cover  the operative field.  After calling a timeout, deltopectoral approach was made.  Cephalic vein mobilized laterally.  About the upper centimeter and a half of the pectoralis was released.  Irrisept solution was utilized.  Fracture ends were visualized.   Irrigation was performed.  The fracture was reduced and held temporarily with a 4-hole one-third tubular plate.  That allowed for internal rotation.  There was a gap where there was missing bone just below the lateral tuberosity.  This was irrigated and  bone graft applied in this area.  The plate was then applied to the reduced fracture.  A combination  of screws in order to take the head out of varus was performed on the superior aspect of the plate.  The distal end of the plate was first fixed with a  single cortical screw.  Good reduction was confirmed in the AP and lateral planes under fluoroscopy.  After we pulled the head back to the plate proximally with cortical screws, they were replaced with cancellous locking screws and pegs were placed in  the remaining holes.  Overall, good alignment was achieved.  At this time, thorough irrigation was performed with 3 liters of irrigating solution.  Irrisept solution was then utilized.  Axillary nerve palpated at the beginning of the case as well as at  the end of the case.  Vancomycin powder placed.  Deltopectoral interval closed using #1 Vicryl suture followed by interrupted inverted 0 Vicryl suture, 2-0 Vicryl suture, and 3-0 Monocryl with Steri-Strips, Aquacel dressing, and shoulder sling applied.   The patient tolerated the procedure well without immediate complications, transferred to the recovery room in stable condition.  Luke's assistance was required at all times for retraction, opening and closing, mobilization of tissue.  His assistance was a medical necessity.   VAI D: 08/03/2024 8:23:37 pm T: 08/03/2024 11:50:00 pm  JOB: [id number]/ [id number]

## 2024-08-03 NOTE — TOC Progression Note (Signed)
 Transition of Care [facility]) - Progression Note    Patient Details  Name: Shannon Sutton MRN: [id number] Date of Birth: [birth year]  [facility] of [facility]) CM/SW Contact  Bridget Cordella Simmonds, LCSW Phone Number: 08/03/2024, 8:49 AM  Clinical Narrative:   ICM continues to follow for DC needs.       Barriers to Discharge: Other (must enter comment) (Return to [facility] vs SNF)               Expected Discharge Plan and Services In-house Referral: Clinical Social Work     Living arrangements for the past 2 months: Assisted Living Facility ([facility])                                       Social Drivers of Health (SDOH) Interventions SDOH Screenings   Food Insecurity: No Food Insecurity (07/28/2024)  Housing: Low Risk  (07/28/2024)  Transportation Needs: No Transportation Needs (07/28/2024)  Utilities: Not At Risk (07/28/2024)  Alcohol Screen: Low Risk  (11/10/2021)  Depression (PHQ2-9): High Risk (04/28/2024)  Financial Resource Strain: Low Risk  (04/27/2024)  Physical Activity: Inactive (04/27/2024)  Social Connections: Unknown (07/28/2024)  Stress: Stress Concern Present (04/27/2024)  Tobacco Use: Medium Risk (07/28/2024)    Readmission Risk Interventions     No data to display

## 2024-08-03 NOTE — Progress Notes (Signed)
 Physical Therapy Treatment Patient Details Name: Shannon Sutton MRN: [id number] DOB: 03/22/1946 Today's Date: 08/03/2024   History of Present Illness Shannon Sutton is a 78 y.o. female who presented to [facility] ED 07/27/14 after fall and c/o weakness. CT head with no acute intracranial abnormality. Repeat LUE x-ray shows displaced acute transverse fracture through the proximal diaphysis of the left humerus. PMHx: Parkinson's disease, anxiety and depression, fibromyalgia, HTN, HLD, restless leg syndrome, urinary incontinence, and arthritis. Of note, recent hospitalization 07/25/24 d/t fall sustaining mildly displaced fracture of the proximal left humeral metaphysis.    PT Comments  Pt resting in bed on arrival, agreeable to session with encouragement as pt reporting increased fatigue this date. Pt agreeable to donn TED hose for session and with continued mild c/o dizziness with positional changes, with SBP dropping from supine>standing at 3 mins, see chart below, with BP improving to 120/63(78) once seated in chair with Les elevated. Pt requiring min A for bed mobility and transfers to stand and CGA for very short in room ambulation over to recliner chair. Continued education on importance and benefits of upright sitting for BP compliance with pt verbalizing understanding and agreeable to time up in chair at end of session. Pt continues to benefit from skilled PT services to progress toward functional mobility goals.    Orthostatic VS for the past 24 hrs (Last 3 readings):  BP- Lying Pulse- Lying BP- Sitting Pulse- Sitting BP- Standing at 0 minutes Pulse- Standing at 0 minutes BP- Standing at 3 minutes Pulse- Standing at 3 minutes  08/03/24 0900 122/73 105 126/56 116 101/55 125 96/54 126      If plan is discharge home, recommend the following: A little help with walking and/or transfers;A little help with bathing/dressing/bathroom;Assistance with cooking/housework;Assist for  transportation;Help with stairs or ramp for entrance   Can travel by private vehicle     Yes  Equipment Recommendations  Cane    Recommendations for Other Services       Precautions / Restrictions Precautions Precautions: Fall Recall of Precautions/Restrictions: Impaired Precaution/Restrictions Comments: LUE in immobilizer, orthostatic hypotension with TED hose Required Braces or Orthoses: Sling Restrictions Weight Bearing Restrictions Per Provider Order: No LUE Weight Bearing Per Provider Order: Non weight bearing     Mobility  Bed Mobility Overal bed mobility: Needs Assistance Bed Mobility: Supine to Sit     Supine to sit: Min assist, HOB elevated     General bed mobility comments: Pt sat up on R side of bed with increased time. She slowly brought BLE off EOB. Assist to elevate trunk and scoot hips fwd til feet flat.    Transfers Overall transfer level: Needs assistance Equipment used: 1 person hand held assist Transfers: Sit to/from Stand Sit to Stand: Min assist           General transfer comment: Pt stood from lowest bed height pushing up with RUE. Powered up with light assist.    Ambulation/Gait Ambulation/Gait assistance: Min assist Gait Distance (Feet): 5 Feet Assistive device: 1 person hand held assist Gait Pattern/deviations: Step-to pattern, Decreased step length - right, Decreased step length - left, Decreased stride length, Narrow base of support Gait velocity: decreased     General Gait Details: pt taking short shuffling steps from EOB over to chair, declining further distance due to fatigue   Stairs             Wheelchair Mobility     Tilt Bed    Modified Rankin (Stroke  Patients Only)       Balance Overall balance assessment: Needs assistance Sitting-balance support: No upper extremity supported, Feet supported Sitting balance-Leahy Scale: Fair Sitting balance - Comments: Pt sat EOB and able to scoot forward.   Standing  balance support: Single extremity supported, Reliant on assistive device for balance Standing balance-Leahy Scale: Poor Standing balance comment: Pt unable to utilize RW d/t LUE immobilization in sling.                            Communication Communication Communication: No apparent difficulties  Cognition Arousal: Alert Behavior During Therapy: WFL for tasks assessed/performed   PT - Cognitive impairments: Memory                       PT - Cognition Comments: poor recall of need for sling use Following commands: Intact      Cueing Cueing Techniques: Verbal cues  Exercises      General Comments General comments (skin integrity, edema, etc.): BP soft but stable, see vitals tab, HR up to 126bpm with standing activity      Pertinent Vitals/Pain Pain Assessment Pain Assessment: Faces Faces Pain Scale: Hurts even more Pain Location: LUE Pain Descriptors / Indicators: Grimacing Pain Intervention(s): Monitored during session, Limited activity within patient's tolerance, Repositioned    Home Living                          Prior Function            PT Goals (current goals can now be found in the care plan section) Acute Rehab PT Goals Patient Stated Goal: Stop falling PT Goal Formulation: With patient Time For Goal Achievement: 08/11/24 Progress towards PT goals: Progressing toward goals    Frequency    Min 2X/week      PT Plan      Co-evaluation              AM-PAC PT 6 Clicks Mobility   Outcome Measure  Help needed turning from your back to your side while in a flat bed without using bedrails?: A Little Help needed moving from lying on your back to sitting on the side of a flat bed without using bedrails?: A Little Help needed moving to and from a bed to a chair (including a wheelchair)?: A Little Help needed standing up from a chair using your arms (e.g., wheelchair or bedside chair)?: A Little Help needed to walk in  hospital room?: A Lot Help needed climbing 3-5 steps with a railing? : Total 6 Click Score: 15    End of Session Equipment Utilized During Treatment: Other (comment) (LUE sling, TED hose) Activity Tolerance: Patient limited by fatigue;Other (comment);Treatment limited secondary to medical complications (Comment) (orthostatic hypotension) Patient left: in chair;with call bell/phone within reach;with chair alarm set Nurse Communication: Mobility status PT Visit Diagnosis: History of falling (Z91.81);Repeated falls (R29.6);Unsteadiness on feet (R26.81);Other abnormalities of gait and mobility (R26.89)     Time: 9150-9083 PT Time Calculation (min) (ACUTE ONLY): 27 min  Charges:    $Therapeutic Activity: 23-37 mins PT General Charges $$ ACUTE PT VISIT: 1 Visit                     Clayburn Weekly R. PTA Acute Rehabilitation Services Office: [phone]   Therisa CHRISTELLA Boor 08/03/2024, 9:19 AM

## 2024-08-03 NOTE — Progress Notes (Signed)
 Lynwood Kipper updated that patient doesn't want to continue with blood transfusion anymore and wants to be unhooked from it. Explained to patient several timnes the purpose of blood transfusion but patient still insist to have it discontinued so that she can sleep. Per Lynwood Kipper, NP, it is her right. Transfusion discontinued as ordered.

## 2024-08-03 NOTE — Transfer of Care (Signed)
 Immediate Anesthesia Transfer of Care Note  Patient: Shannon Sutton  Procedure(s) Performed: OPEN REDUCTION INTERNAL FIXATION (ORIF) PROXIMAL HUMERUS FRACTURE (Left)  Patient Location: PACU  Anesthesia Type:General  Level of Consciousness: awake, alert , and oriented  Airway & Oxygen Therapy: Patient Spontanous Breathing and Patient connected to nasal cannula oxygen  Post-op Assessment: Report given to RN and Post -op Vital signs reviewed and stable  Post vital signs: Reviewed and stable  Last Vitals:  Vitals Value Taken Time  BP 160/76 08/03/24 20:21  Temp 36.7 C 08/03/24 20:21  Pulse 109 08/03/24 20:26  Resp 21 08/03/24 20:26  SpO2 93 % 08/03/24 20:26  Vitals shown include unfiled device data.  Last Pain:  Vitals:   08/03/24 1611  TempSrc:   PainSc: 4       Patients Stated Pain Goal: 2 (08/03/24 1611)  Complications: No notable events documented.

## 2024-08-03 NOTE — Anesthesia Postprocedure Evaluation (Signed)
 Anesthesia Post Note  Patient: Shannon Sutton  Procedure(s) Performed: OPEN REDUCTION INTERNAL FIXATION (ORIF) PROXIMAL HUMERUS FRACTURE (Left)     Patient location during evaluation: PACU Anesthesia Type: General Level of consciousness: awake and alert Pain management: pain level controlled Vital Signs Assessment: post-procedure vital signs reviewed and stable Respiratory status: spontaneous breathing, nonlabored ventilation, respiratory function stable and patient connected to nasal cannula oxygen Cardiovascular status: blood pressure returned to baseline and stable Postop Assessment: no apparent nausea or vomiting Anesthetic complications: no   No notable events documented.  Last Vitals:  Vitals:   08/03/24 2045 08/03/24 2100  BP: (!) 142/70 (!) 147/66  Pulse: (!) 119 (!) 116  Resp: (!) 22 (!) 36  Temp:  36.8 C  SpO2: 94% 96%    Last Pain:  Vitals:   08/03/24 2100  TempSrc:   PainSc: Asleep                 Thom JONELLE Peoples

## 2024-08-03 NOTE — Progress Notes (Signed)
 Patient block for surgery for her left shoulder. Risk and benefits discussed with the daughters Discharge planning also discussed Swelling in the shoulder is present and the displacement appears likely increased.

## 2024-08-03 NOTE — Progress Notes (Signed)
 Shannon Sutton  FMW:[id number] DOB: Jun 06, 1946 DOA: 07/27/2024 PCP: Theophilus Andrews, Tully GRADE, MD    Brief Narrative:  78 year old with a history of Parkinson's disease, a recent left humeral fracture, anxiety/depression, fibromyalgia, HTN, HLD, RLS, chronic urinary incontinence, and osteoarthritis who presented to the [facility] ER with generalized weakness and a fall.  It was felt that her falls were related to a recent increase in her dose of Sinemet .  Workup was notable for orthostatic hypotension.    Since admission her hospitalization has been complicated by her refusal to take Sinemet  at all.  Goals of Care:   Code Status: Do not attempt resuscitation (DNR) PRE-ARREST INTERVENTIONS DESIRED   DVT prophylaxis: Lovenox    Interim Hx: Afebrile.  Sinus tachycardia with heart rate 102-109.  Blood pressure stable.  No new complaints today.  Denies shortness of breath or chest pain at present.  Assessment & Plan:  Parkinson's disease with orthostatic hypotension and generalized weakness/falls Orthostasis felt to be related to Parkinson's +/- recent increase in Sinemet  -TTE noted preserved EF with no AoS -compression stockings and abdominal binder placed -counseled on need for slow positional changes -TSH normal -cortisol not low -was initially refusing Sinemet  at all but has now agreed to resume at her prior dose of 25/105 times a day - Neurology has evaluated and recommended continuing Sinemet   Sinus tachycardia TSH normal -CTa chest without evidence of PE -possibly related to Sinemet  - stable at this time   Normocytic anemia / anemia of chronic disease  Baseline hemoglobin appears to be 12-13 - hemoglobin has been 8.6-9.0 during this admission - in setting of orthostasis patient was transfused 1 unit PRBC 10/21 - labs noted low normal iron with normal B12 and folate and low TIBC - perhaps this is nutritional (albumin low at 3.1)  Hypokalemia Corrected with supplementation  Left  humerus fracture Pre-existing -was scheduled for orthopedic follow-up on day of admission -follow-up x-rays this admission noted displaced proximal humeral fracture with increased angulation and displacement from prior exam -continue shoulder sling -for surgical intervention 10/22  Scalp laceration  Pre-existing - stapled previously -staples will need to be removed 10/23-10/26  Right posterior 9th through 11th rib fractures Noted on CT scan -nondisplaced  RLS Continue Requip   HLD Continue rosuvastatin   Vitamin D  deficiency 25-hydroxy vitamin D  21 this admission -supplement  Family Communication:  Disposition:   Skilled Nursing-Short Term Rehab (<3 Hours/Day)08/01/2024 1745  Objective: Blood pressure (!) 122/57, pulse (!) 104, temperature (!) 97.5 F (36.4 C), resp. rate 17, height 5' 0.98 (1.549 m), weight 53.9 kg, SpO2 99%.  Intake/Output Summary (Last 24 hours) at 08/03/2024 0910 Last data filed at 08/03/2024 0500 Gross per 24 hour  Intake 336.67 ml  Output 700 ml  Net -363.33 ml   Filed Weights   07/29/24 1300  Weight: 53.9 kg    Examination: General: No acute respiratory distress Lungs: Clear to auscultation bilaterally without wheezes or crackles Cardiovascular: Regular rate and rhythm without murmur gallop or rub normal S1 and S2 Abdomen: Nontender, nondistended, soft, bowel sounds positive, no rebound, no ascites, no appreciable mass Extremities: No significant cyanosis, clubbing, or edema bilateral lower extremities  CBC: Recent Labs  Lab 07/31/24 0730 08/01/24 0731 08/02/24 0459 08/02/24 1111  WBC 7.4 7.6 7.1  --   NEUTROABS 4.8 4.3 4.2  --   HGB 9.2* 9.0* 8.0* 8.2*  HCT 27.5* 26.4* 24.0* 24.6*  MCV 97.5 96.7 98.4  --   PLT 270 271 281  --  Basic Metabolic Panel: Recent Labs  Lab 07/31/24 0730 08/01/24 0731 08/02/24 0500  NA 137 136 135  K 4.2 3.8 3.9  CL 103 101 102  CO2 19* 22 24  GLUCOSE 95 97 85  BUN 5* 8 8  CREATININE 0.55 0.42*  0.50  CALCIUM  8.6* 8.6* 8.5*  MG 1.8 1.9 1.9  PHOS 3.5 3.5 3.6   GFR: Estimated Creatinine Clearance: 42.3 mL/min (by C-G formula based on SCr of 0.5 mg/dL).   Scheduled Meds:  acetaminophen   1,000 mg Oral TID   carbidopa -levodopa   1 tablet Oral 5 X Daily   chlorhexidine  60 mL Topical Once   cholecalciferol  400 Units Oral Daily   mirtazapine   15 mg Oral QHS   mupirocin ointment  1 Application Nasal BID   polyethylene glycol  17 g Oral BID   povidone-iodine   2 Application Topical Once   rOPINIRole   4 mg Oral QHS   rosuvastatin   5 mg Oral Daily   Continuous Infusions:  sodium chloride       ceFAZolin (ANCEF) IV     tranexamic acid       LOS: 6 days   Reyes IVAR Moores, MD [facility] Office  [phone] Pager - Text Page per Tracey  If 7PM-7AM, please contact night-coverage per Amion 08/03/2024, 9:10 AM

## 2024-08-03 NOTE — Anesthesia Preprocedure Evaluation (Signed)
 Anesthesia Evaluation  Patient identified by MRN, date of birth, ID band Patient awake    Reviewed: Allergy & Precautions, NPO status , Patient's Chart, lab work & pertinent test results, reviewed documented beta blocker date and time   Airway Mallampati: II  TM Distance: >3 FB     Dental no notable dental hx.    Pulmonary sleep apnea , former smoker   breath sounds clear to auscultation       Cardiovascular hypertension,  Rhythm:Regular Rate:Normal     Neuro/Psych  Headaches PSYCHIATRIC DISORDERS Anxiety Depression     Neuromuscular disease    GI/Hepatic   Endo/Other    Renal/GU      Musculoskeletal  (+) Arthritis ,  Fibromyalgia -  Abdominal   Peds  Hematology  (+) Blood dyscrasia, anemia   Anesthesia Other Findings   Reproductive/Obstetrics                              Anesthesia Physical Anesthesia Plan  ASA: 3  Anesthesia Plan: General   Post-op Pain Management: Regional block*   Induction: Intravenous  PONV Risk Score and Plan: 2 and Ondansetron  and Dexamethasone  Airway Management Planned: Oral ETT  Additional Equipment:   Intra-op Plan:   Post-operative Plan: Extubation in OR  Informed Consent: I have reviewed the patients History and Physical, chart, labs and discussed the procedure including the risks, benefits and alternatives for the proposed anesthesia with the patient or authorized representative who has indicated his/her understanding and acceptance.     Dental advisory given  Plan Discussed with: CRNA  Anesthesia Plan Comments:         Anesthesia Quick Evaluation

## 2024-08-03 NOTE — Anesthesia Procedure Notes (Signed)
 Anesthesia Regional Block: Interscalene brachial plexus block   Pre-Anesthetic Checklist: , timeout performed,  Correct Patient, Correct Site, Correct Laterality,  Correct Procedure, Correct Position, site marked,  Risks and benefits discussed,  Surgical consent,  Pre-op evaluation,  At surgeon's request and post-op pain management  Laterality: Left  Prep: Maximum Sterile Barrier Precautions used, chloraprep       Needles:  Injection technique: Single-shot  Needle Type: Echogenic Needle      Needle Gauge: 20     Additional Needles:   Procedures:,,,, ultrasound used (permanent image in chart),,    Narrative:  Start time: 08/03/2024 4:38 PM End time: 08/03/2024 4:45 PM Injection made incrementally with aspirations every 5 mL.  Performed by: Personally  Anesthesiologist: Keneth Lynwood POUR, MD

## 2024-08-03 NOTE — Anesthesia Procedure Notes (Signed)
 Procedure Name: Intubation Date/Time: 08/03/2024 5:26 PM  Performed by: Atanacio Arland HERO, CRNAPre-anesthesia Checklist: Patient identified, Emergency Drugs available, Suction available and Patient being monitored Patient Re-evaluated:Patient Re-evaluated prior to induction Oxygen Delivery Method: Circle System Utilized Preoxygenation: Pre-oxygenation with 100% oxygen Induction Type: IV induction Ventilation: Mask ventilation without difficulty Laryngoscope Size: Mac and 3 Grade View: Grade II Tube type: Oral Tube size: 7.0 mm Number of attempts: 1 Airway Equipment and Method: Stylet Placement Confirmation: ETT inserted through vocal cords under direct vision, positive ETCO2 and breath sounds checked- equal and bilateral Secured at: 21 cm Tube secured with: Tape Dental Injury: Teeth and Oropharynx as per pre-operative assessment

## 2024-08-04 ENCOUNTER — Other Ambulatory Visit

## 2024-08-04 DIAGNOSIS — D649 Anemia, unspecified: Secondary | ICD-10-CM | POA: Diagnosis not present

## 2024-08-04 DIAGNOSIS — R55 Syncope and collapse: Secondary | ICD-10-CM | POA: Diagnosis not present

## 2024-08-04 DIAGNOSIS — S42292D Other displaced fracture of upper end of left humerus, subsequent encounter for fracture with routine healing: Secondary | ICD-10-CM | POA: Diagnosis not present

## 2024-08-04 LAB — TYPE AND SCREEN
ABO/RH(D): A POS
Antibody Screen: NEGATIVE
Unit division: 0
Unit division: 0

## 2024-08-04 LAB — BPAM RBC
Blood Product Expiration Date: 202511152359
Blood Product Expiration Date: 202511142359
ISSUE DATE / TIME: 202510221852
ISSUE DATE / TIME: 202510212256
Unit Type and Rh: 6200
Unit Type and Rh: 6200

## 2024-08-04 LAB — CBC
HCT: 30.8 % — ABNORMAL LOW (ref 36.0–46.0)
Hemoglobin: 10.6 g/dL — ABNORMAL LOW (ref 12.0–15.0)
MCH: 32.4 pg (ref 26.0–34.0)
MCHC: 34.4 g/dL (ref 30.0–36.0)
MCV: 94.2 fL (ref 80.0–100.0)
Platelets: 288 K/uL (ref 150–400)
RBC: 3.27 MIL/uL — ABNORMAL LOW (ref 3.87–5.11)
RDW: 18.1 % — ABNORMAL HIGH (ref 11.5–15.5)
WBC: 8.7 K/uL (ref 4.0–10.5)
nRBC: 0 % (ref 0.0–0.2)

## 2024-08-04 MED ORDER — POTASSIUM CHLORIDE CRYS ER 20 MEQ PO TBCR
40.0000 meq | EXTENDED_RELEASE_TABLET | Freq: Once | ORAL | Status: AC
  Administered 2024-08-04: 40 meq via ORAL
  Filled 2024-08-04: qty 2

## 2024-08-04 NOTE — Progress Notes (Signed)
 Shannon Sutton  FMW:[id number] DOB: 07/16/1946 DOA: 07/27/2024 PCP: Theophilus Andrews, Tully GRADE, MD    Brief Narrative:  78 year old with a history of Parkinson's disease, a recent left humeral fracture, anxiety/depression, fibromyalgia, HTN, HLD, RLS, chronic urinary incontinence, and osteoarthritis who presented to the [facility] with generalized weakness and a fall.  It was felt that her falls were related to a recent increase in her dose of Sinemet .  Workup was notable for orthostatic hypotension.    Goals of Care:   Code Status: Do not attempt resuscitation (DNR) PRE-ARREST INTERVENTIONS DESIRED   DVT prophylaxis: Place and maintain sequential compression device Start: 08/03/24 2029    Interim Hx: The patient underwent open reduction and internal fixation of 2 part proximal humerus fracture 10/22.  No acute events were reported overnight.  Afebrile.  Sinus tachycardia persists at 93-118.  Blood pressure stable.  Resting comfortably in bed at the time of my visit.  Alert and in good spirits.  Assessment & Plan:  Parkinson's disease with orthostatic hypotension and generalized weakness/falls Orthostasis felt to be related to Parkinson's +/- recent increase in Sinemet  - TTE noted preserved EF with no AoS - compression stockings and abdominal binder placed -counseled on need for slow positional changes -TSH normal -cortisol not low -was initially refusing Sinemet  altogether but has since agreed to resume her prior dose of 25/100 five times a day - Neurology has evaluated and recommended continuing Sinemet  at this dose  Sinus tachycardia TSH normal -CTa chest without evidence of PE -possibly due to pain -heart rate stable at this time  Normocytic anemia / anemia of chronic disease  Baseline hemoglobin appears to be 12-13 - hemoglobin has been 8.6-9.0 during this admission - in setting of orthostasis patient was transfused 1 unit PRBC 10/21 - labs noted low normal iron with normal B12 and  folate and low TIBC - perhaps this is nutritional (albumin low at 3.1) -hemoglobin stable for now  Hypokalemia Supplement and follow  Left humerus fracture Pre-existing -was scheduled for orthopedic follow-up on day of admission -follow-up x-rays this admission noted displaced proximal humeral fracture with increased angulation and displacement from prior exam -underwent successful ORIF of 2 part proximal humerus fracture 10/22  Scalp laceration  Pre-existing - stapled previously -staples removed 10/23  Right posterior 9th through 11th rib fractures Noted on CT scan -nondisplaced  RLS Continue Requip   HLD Continue rosuvastatin   Vitamin D  deficiency 25-hydroxy vitamin D  21 this admission - supplementing  Family Communication: No family present time of exam Disposition:   Skilled Nursing-Short Term Rehab (<3 Hours/Day)08/03/2024 0900  Objective: Blood pressure 122/65, pulse (!) 114, temperature 97.9 F (36.6 C), temperature source Oral, resp. rate 14, height 5' 1 (1.549 m), weight 53.5 kg, SpO2 96%.  Intake/Output Summary (Last 24 hours) at 08/04/2024 0824 Last data filed at 08/03/2024 2006 Gross per 24 hour  Intake 1670 ml  Output 450 ml  Net 1220 ml   Filed Weights   07/29/24 1300 08/03/24 1527  Weight: 53.9 kg 53.5 kg    Examination: General: No acute respiratory distress Lungs: Clear to auscultation bilaterally without wheezes or crackles Cardiovascular: Tachycardic but regular without murmur or rub Abdomen: NT/ND, soft, BS positive Extremities: No significant cyanosis, clubbing, or edema bilateral lower extremities  CBC: Recent Labs  Lab 07/31/24 0730 08/01/24 0731 08/02/24 0459 08/02/24 1111 08/03/24 1829 08/04/24 0531  WBC 7.4 7.6 7.1  --   --  8.7  NEUTROABS 4.8 4.3 4.2  --   --   --  HGB 9.2* 9.0* 8.0* 8.2* 8.2* 10.6*  HCT 27.5* 26.4* 24.0* 24.6* 24.0* 30.8*  MCV 97.5 96.7 98.4  --   --  94.2  PLT 270 271 281  --   --  288   Basic Metabolic  Panel: Recent Labs  Lab 07/31/24 0730 08/01/24 0731 08/02/24 0500 08/03/24 1829  NA 137 136 135 136  K 4.2 3.8 3.9 3.1*  CL 103 101 102  --   CO2 19* 22 24  --   GLUCOSE 95 97 85  --   BUN 5* 8 8  --   CREATININE 0.55 0.42* 0.50  --   CALCIUM  8.6* 8.6* 8.5*  --   MG 1.8 1.9 1.9  --   PHOS 3.5 3.5 3.6  --    GFR: Estimated Creatinine Clearance: 42.3 mL/min (by C-G formula based on SCr of 0.5 mg/dL).   Scheduled Meds:  acetaminophen   1,000 mg Oral TID   aspirin  81 mg Oral BID   carbidopa -levodopa   1 tablet Oral 5 X Daily   cholecalciferol  400 Units Oral Daily   mirtazapine   15 mg Oral QHS   mupirocin ointment  1 Application Nasal BID   polyethylene glycol  17 g Oral BID   rOPINIRole   4 mg Oral QHS   rosuvastatin   5 mg Oral Daily   senna-docusate  2 tablet Oral QHS   sertraline  100 mg Oral Daily     LOS: 7 days   Reyes IVAR Moores, MD [facility] Office  [phone] Pager - Text Page per Tracey  If 7PM-7AM, please contact night-coverage per Amion 08/04/2024, 8:24 AM

## 2024-08-04 NOTE — Progress Notes (Signed)
 Nutrition Follow-up  DOCUMENTATION CODES:   Not applicable  INTERVENTION:  Regular diet  Encourage PO intake  400 units Vit D daily for low levels on 10/16    NUTRITION DIAGNOSIS:   Altered nutrition lab value related to  (vitamin deficiency) as evidenced by  (25-Hydroxyvitamin D (25-OHD) < 30). - -ongoing    GOAL:   Patient will meet greater than or equal to 90% of their needs - met with PO    MONITOR:   PO intake, Labs  REASON FOR ASSESSMENT:   Consult Assessment of nutrition requirement/status  ASSESSMENT:   medical history significant for Parkinson's disease, anxiety and depression, fibromyalgia, HTN, HLD, restless leg syndrome, urinary incontinence and arthritis who presented to [facility] for evaluation of fall and weakness.  10/22 - ORIF   Patient seen in room, daughter at bedside. Day 1 post op ORIF. Pt's po intake remains fair, she sometimes does not like food that is ordered but eating > 75% of meals. Reports she will also have family bring in additional food/snacks. Pt denies any nausea or abd pain with PO intake.   Admit weight: 53.9 kg  Current weight: 53.5 kg  Nutritionally Relevant Medications: D3, mirtazapine , miralax , senokot-s  Labs Reviewed: K 3.1    NUTRITION - FOCUSED PHYSICAL EXAM:  Flowsheet Row Most Recent Value  Orbital Region No depletion  Upper Arm Region No depletion  Thoracic and Lumbar Region No depletion  Buccal Region No depletion  Temple Region No depletion  Clavicle Bone Region Mild depletion  Clavicle and Acromion Bone Region Mild depletion  Scapular Bone Region No depletion  Dorsal Hand Mild depletion  Patellar Region No depletion  Anterior Thigh Region No depletion  Posterior Calf Region No depletion  Hair Reviewed  Eyes Reviewed  Mouth Reviewed  Skin Reviewed  Nails Reviewed    Diet Order:   Diet Order             Diet regular Fluid consistency: Thin  Diet effective now                    EDUCATION NEEDS:   No education needs have been identified at this time  Skin:  Skin Assessment: Reviewed RN Assessment  Last BM:  10/23  Height:   Ht Readings from Last 1 Encounters:  08/03/24 5' 1 (1.549 m)    Weight:   Wt Readings from Last 1 Encounters:  08/03/24 53.5 kg   BMI:  Body mass index is 22.3 kg/m.  Estimated Nutritional Needs:   Kcal:  1350-1600 kcal  Protein:  55-65 grams  Fluid:  1.3-1.6L/d  Madalyn Potters, MS, RD, LDN Clinical Dietitian  Contact via secure chat. If unavailable, use group chat RD Inpatient.

## 2024-08-04 NOTE — TOC Progression Note (Signed)
 Transition of Care [facility]) - Progression Note    Patient Details  Name: Shannon Sutton MRN: [id number] Date of Birth: [birth year]  Transition of Care [facility] CM/SW Contact  Bridget Cordella Simmonds, LCSW Phone Number: 08/04/2024, 4:10 PM  Clinical Narrative:  CSW spoke with [name] regarding potential DC tomorrow.  She is planning to transport pt.  Message sent to [facility] about potential DC.       Barriers to Discharge: Other (must enter comment) (Return to [facility] vs SNF)               Expected Discharge Plan and Services In-house Referral: Clinical Social Work     Living arrangements for the past 2 months: Assisted Living Facility ([facility] [place])                                       Social Drivers of Health (SDOH) Interventions SDOH Screenings   Food Insecurity: No Food Insecurity (07/28/2024)  Housing: Low Risk  (07/28/2024)  Transportation Needs: No Transportation Needs (07/28/2024)  Utilities: Not At Risk (07/28/2024)  Alcohol Screen: Low Risk  (11/10/2021)  Depression (PHQ2-9): High Risk (04/28/2024)  Financial Resource Strain: Low Risk  (04/27/2024)  Physical Activity: Inactive (04/27/2024)  Social Connections: Unknown (07/28/2024)  Stress: Stress Concern Present (04/27/2024)  Tobacco Use: Medium Risk (08/03/2024)    Readmission Risk Interventions     No data to display

## 2024-08-04 NOTE — Progress Notes (Addendum)
  Subjective: Patient stable.  Block is still starting to wear off.  Hand in Ace wrap.  Fingers are mobile and perfused.   Objective: Vital signs in last 24 hours: Temp:  [97.9 F (36.6 C)-98.8 F (37.1 C)] 97.9 F (36.6 C) (10/23 0833) Pulse Rate:  [102-119] 102 (10/23 0833) Resp:  [14-36] 19 (10/23 0833) BP: (119-160)/(62-76) 131/65 (10/23 0833) SpO2:  [92 %-97 %] 97 % (10/23 0833) Weight:  [53.5 kg] 53.5 kg (10/22 1527)  Intake/Output from previous day: 10/22 0701 - 10/23 0700 In: 1670 [I.V.:900; Blood:320; IV Piggyback:450] Out: 450 [Urine:100; Blood:350] Intake/Output this shift: No intake/output data recorded.  Exam:  Intact pulses distally No cellulitis present Compartment soft  Labs: Recent Labs    08/02/24 0459 08/02/24 1111 08/03/24 1829 08/04/24 0531  HGB 8.0* 8.2* 8.2* 10.6*   Recent Labs    08/02/24 0459 08/02/24 1111 08/03/24 1829 08/04/24 0531  WBC 7.1  --   --  8.7  RBC 2.44*  --   --  3.27*  HCT 24.0*   < > 24.0* 30.8*  PLT 281  --   --  288   < > = values in this interval not displayed.   Recent Labs    08/02/24 0500 08/03/24 1829  NA 135 136  K 3.9 3.1*  CL 102  --   CO2 24  --   BUN 8  --   CREATININE 0.50  --   GLUCOSE 85  --   CALCIUM  8.5*  --    No results for input(s): LABPT, INR in the last 72 hours.  Assessment/Plan: Plan at this time is to start elbow and wrist range of motion.  Hold off on shoulder range of motion until we are sure that this incision is healed over.  After a week I think we can start doing some pendulum exercises.  From an orthopedic standpoint she will be ready to get back to assisted living with 24-hour supervision tomorrow.  Aspirin for DVT prophylaxis.  CBC pending for a.m.   Shannon Sutton Hamilton Shivank Pinedo 08/04/2024, 11:09 AM

## 2024-08-04 NOTE — Progress Notes (Signed)
 Occupational Therapy Treatment Patient Details Name: Shannon Sutton MRN: [id number] DOB: Nov 23, 1945 Today's Date: 08/04/2024   History of present illness Shannon Sutton is a 78 y.o. female who presented to [facility] ED 07/27/14 after fall and c/o weakness. CT head with no acute intracranial abnormality. Repeat LUE x-ray shows displaced acute transverse fracture through the proximal diaphysis of the left humerus. ORIF LUE 08/03/24 PMHx: Parkinson's disease, anxiety and depression, fibromyalgia, HTN, HLD, restless leg syndrome, urinary incontinence, and arthritis. Of note, recent hospitalization 07/25/24 d/t fall sustaining mildly displaced fracture of the proximal left humeral metaphysis.   OT comments  Pt making limited progress towards OT goals this session. Pt with limited to no recall of shoulder precautions. Pt able to complete finger, wrist, elbow exercises at bed level in supine prior to max A to don sling. Bed mobility at mod A with cues for sequencing. Pt was max A for LB dressing (donning ted hose and socks). Pt was found wet in bed from malfunctioning purewick, Pt overall max A for washing skin and gown change. Pt min A for transfer to recliner with steady BP. Pt required re-education at end of session for shoulder precautions. Pt also re-educated on ice man machine, refilled with ice and applied to L shoulder. OT will continue to follow acutely, POC for post-acute rehab of <3 hours daily remains appropriate at this time.       If plan is discharge home, recommend the following:  A little help with walking and/or transfers;A lot of help with bathing/dressing/bathroom;Assistance with cooking/housework;Direct supervision/assist for medications management;Assist for transportation   Equipment Recommendations  None recommended by OT    Recommendations for Other Services      Precautions / Restrictions Precautions Precautions: Fall Recall of Precautions/Restrictions:  Impaired Precaution/Restrictions Comments: LUE in immobilizer, orthostatic hypotension wear TED hose Required Braces or Orthoses: Sling Restrictions Weight Bearing Restrictions Per Provider Order: Yes LUE Weight Bearing Per Provider Order: Non weight bearing Other Position/Activity Restrictions: no AROM/PROM Lshoulder, OK for AROM of elbow, wrist, digits. Start pendulums a week out 10/29 per ortho note today 10/23       Mobility Bed Mobility Overal bed mobility: Needs Assistance Bed Mobility: Supine to Sit     Supine to sit: Min assist, HOB elevated     General bed mobility comments: Pt sat up on R side of bed with increased time. She slowly brought BLE off EOB. Assist to elevate trunk and scoot hips fwd til feet flat.    Transfers Overall transfer level: Needs assistance Equipment used: 1 person hand held assist Transfers: Sit to/from Stand, Bed to chair/wheelchair/BSC Sit to Stand: Min assist     Step pivot transfers: Min assist     General transfer comment: Pt stood from lowest bed height pushing up with RUE. Powered up with light assist, min A for balance     Balance Overall balance assessment: Needs assistance Sitting-balance support: No upper extremity supported, Feet supported Sitting balance-Leahy Scale: Fair Sitting balance - Comments: Pt sat EOB and able to scoot forward.   Standing balance support: Single extremity supported, Reliant on assistive device for balance Standing balance-Leahy Scale: Poor Standing balance comment: HHA                           ADL either performed or assessed with clinical judgement   ADL Overall ADL's : Needs assistance/impaired Eating/Feeding: Set up  Upper Body Dressing : Maximal assistance;Sitting Upper Body Dressing Details (indicate cue type and reason): now gown and sling Lower Body Dressing: Maximal assistance;Bed level Lower Body Dressing Details (indicate cue type and reason): donning  ted hose and socks Toilet Transfer: Minimal assistance;Stand-pivot Toilet Transfer Details (indicate cue type and reason): simulated through recliner transfer, Pt using purewick Toileting- Clothing Manipulation and Hygiene: Maximal assistance;Sitting/lateral lean         General ADL Comments: Pt with decreased BUE functional use impacting independence with ADL tasks.    Extremity/Trunk Assessment Upper Extremity Assessment Upper Extremity Assessment: LUE deficits/detail RUE Deficits / Details: shoulder rotator cuff arthropathy. Limited shoulder flexion to about 15 degrees. Pt able to reach face and minimally cross midline. RUE Sensation: WNL LUE Deficits / Details: s/p ORIF, no AROM/PROM of shoulder at this time. Prefers elbow extension, AROM of wrist and fingers WFL LUE: Unable to fully assess due to immobilization LUE Sensation: WNL LUE Coordination: decreased fine motor   Lower Extremity Assessment Lower Extremity Assessment: Overall WFL for tasks assessed        Vision   Vision Assessment?: Vision impaired- to be further tested in functional context   Perception     Praxis     Communication Communication Communication: No apparent difficulties   Cognition Arousal: Alert Behavior During Therapy: WFL for tasks assessed/performed Cognition: Cognition impaired     Awareness: Intellectual awareness intact, Online awareness impaired Memory impairment (select all impairments): Short-term memory, Working memory Attention impairment (select first level of impairment): Sustained attention Executive functioning impairment (select all impairments): Reasoning, Problem solving OT - Cognition Comments: some self-limiting tendencies. needs frequently repeated reinforcement and encouragement                 Following commands: Intact        Cueing   Cueing Techniques: Verbal cues  Exercises      Shoulder Instructions       General Comments with ted hose, BP soft  but stable    Pertinent Vitals/ Pain       Pain Assessment Pain Assessment: Faces Faces Pain Scale: Hurts even more Pain Location: LUE Pain Descriptors / Indicators: Grimacing Pain Intervention(s): Monitored during session, Repositioned, Ice applied, RN gave pain meds during session  Home Living                                          Prior Functioning/Environment              Frequency  Min 2X/week        Progress Toward Goals  OT Goals(current goals can now be found in the care plan section)  Progress towards OT goals: Progressing toward goals  Acute Rehab OT Goals Patient Stated Goal: be independent OT Goal Formulation: With patient Time For Goal Achievement: 08/11/24 Potential to Achieve Goals: Good  Plan      Co-evaluation                 AM-PAC OT 6 Clicks Daily Activity     Outcome Measure   Help from another person eating meals?: A Little Help from another person taking care of personal grooming?: A Lot Help from another person toileting, which includes using toliet, bedpan, or urinal?: A Lot Help from another person bathing (including washing, rinsing, drying)?: A Lot Help from another person to put on and taking off regular  upper body clothing?: A Lot Help from another person to put on and taking off regular lower body clothing?: A Lot 6 Click Score: 13    End of Session Equipment Utilized During Treatment: Gait belt;Other (comment) (sling)  OT Visit Diagnosis: Unsteadiness on feet (R26.81);Repeated falls (R29.6);Muscle weakness (generalized) (M62.81);Pain Pain - Right/Left: Left Pain - part of body: Arm;Shoulder (BUE)   Activity Tolerance Patient tolerated treatment well   Patient Left in chair;with call bell/phone within reach;with chair alarm set;with nursing/sitter in room   Nurse Communication Mobility status        Time: 8973-8894 OT Time Calculation (min): 39 min  Charges: OT General Charges $OT  Visit: 1 Visit OT Treatments $Self Care/Home Management : 8-22 mins $Therapeutic Activity: 8-22 mins $Therapeutic Exercise: 8-22 mins  Leita DEL OTR/L Acute Rehabilitation Services Office: [phone]  Leita PARAS [facility] 08/04/2024, 2:37 PM

## 2024-08-04 NOTE — Plan of Care (Signed)
  Problem: Health Behavior/Discharge Planning: Goal: Ability to manage health-related needs will improve Outcome: Progressing   Problem: Clinical Measurements: Goal: Will remain free from infection Outcome: Progressing Goal: Cardiovascular complication will be avoided Outcome: Progressing   Problem: Nutrition: Goal: Adequate nutrition will be maintained Outcome: Progressing   Problem: Pain Managment: Goal: General experience of comfort will improve and/or be controlled Outcome: Progressing

## 2024-08-04 NOTE — Progress Notes (Addendum)
 9 Staples removed from back of pt's head per order, wound looks dry clean and attached, no drainage noted, no c/o of pain.

## 2024-08-05 ENCOUNTER — Ambulatory Visit: Admitting: Orthopedic Surgery

## 2024-08-05 ENCOUNTER — Encounter (HOSPITAL_COMMUNITY): Payer: Self-pay | Admitting: Orthopedic Surgery

## 2024-08-05 ENCOUNTER — Other Ambulatory Visit (HOSPITAL_COMMUNITY): Payer: Self-pay

## 2024-08-05 DIAGNOSIS — D649 Anemia, unspecified: Secondary | ICD-10-CM | POA: Diagnosis not present

## 2024-08-05 DIAGNOSIS — R55 Syncope and collapse: Secondary | ICD-10-CM | POA: Diagnosis not present

## 2024-08-05 DIAGNOSIS — S42292D Other displaced fracture of upper end of left humerus, subsequent encounter for fracture with routine healing: Secondary | ICD-10-CM | POA: Diagnosis not present

## 2024-08-05 LAB — CBC
HCT: 27.6 % — ABNORMAL LOW (ref 36.0–46.0)
Hemoglobin: 9.5 g/dL — ABNORMAL LOW (ref 12.0–15.0)
MCH: 32.6 pg (ref 26.0–34.0)
MCHC: 34.4 g/dL (ref 30.0–36.0)
MCV: 94.8 fL (ref 80.0–100.0)
Platelets: 316 K/uL (ref 150–400)
RBC: 2.91 MIL/uL — ABNORMAL LOW (ref 3.87–5.11)
RDW: 18.6 % — ABNORMAL HIGH (ref 11.5–15.5)
WBC: 7.8 K/uL (ref 4.0–10.5)
nRBC: 0 % (ref 0.0–0.2)

## 2024-08-05 LAB — BASIC METABOLIC PANEL WITH GFR
Anion gap: 12 (ref 5–15)
BUN: 5 mg/dL — ABNORMAL LOW (ref 8–23)
CO2: 24 mmol/L (ref 22–32)
Calcium: 8.6 mg/dL — ABNORMAL LOW (ref 8.9–10.3)
Chloride: 103 mmol/L (ref 98–111)
Creatinine, Ser: 0.49 mg/dL (ref 0.44–1.00)
GFR, Estimated: >60 mL/min (ref >60)
Glucose, Bld: 104 mg/dL — ABNORMAL HIGH (ref 70–99)
Potassium: 3.3 mmol/L — ABNORMAL LOW (ref 3.5–5.1)
Sodium: 139 mmol/L (ref 135–145)

## 2024-08-05 LAB — MAGNESIUM: Magnesium: 1.9 mg/dL (ref 1.7–2.4)

## 2024-08-05 MED ORDER — ASPIRIN 81 MG PO CHEW
81.0000 mg | CHEWABLE_TABLET | Freq: Two times a day (BID) | ORAL | 0 refills | Status: AC
  Filled 2024-08-05: qty 42, 21d supply, fill #0

## 2024-08-05 MED ORDER — OXYCODONE HCL 5 MG PO TABS: 5.0000 mg | ORAL_TABLET | ORAL | 0 refills | Status: AC | PRN

## 2024-08-05 MED ORDER — CHOLECALCIFEROL 10 MCG (400 UNIT) PO TABS: 400.0000 [IU] | ORAL_TABLET | Freq: Every day | ORAL | Status: DC

## 2024-08-05 MED ORDER — ASPIRIN 81 MG PO CHEW: 81.0000 mg | CHEWABLE_TABLET | Freq: Two times a day (BID) | ORAL | 0 refills | Status: DC

## 2024-08-05 MED ORDER — CHOLECALCIFEROL 10 MCG (400 UNIT) PO TABS
400.0000 [IU] | ORAL_TABLET | Freq: Every day | ORAL | 0 refills | Status: AC
  Filled 2024-08-05: qty 90, 90d supply, fill #0

## 2024-08-05 MED ORDER — OXYCODONE HCL 5 MG PO TABS: 5.0000 mg | ORAL_TABLET | ORAL | 0 refills | Status: DC | PRN

## 2024-08-05 MED ORDER — POTASSIUM CHLORIDE CRYS ER 20 MEQ PO TBCR
20.0000 meq | EXTENDED_RELEASE_TABLET | Freq: Every day | ORAL | 0 refills | Status: AC
  Filled 2024-08-05: qty 30, 30d supply, fill #0

## 2024-08-05 NOTE — Plan of Care (Signed)

## 2024-08-05 NOTE — TOC Transition Note (Signed)
 Transition of Care [facility]) - Discharge Note   Patient Details  Name: Shannon Sutton MRN: [id number] Date of Birth: [birth year]  Transition of Care [facility]) CM/SW Contact:  Bridget Cordella Simmonds, LCSW Phone Number: 08/05/2024, 11:10 AM   Clinical Narrative:   Pt discharging to [facility].  RN call report to [phone].   Pt [name] will transport pt back to [place].     Final next level of care: Assisted Living Barriers to Discharge: Barriers Resolved   Patient Goals and CMS Choice            Discharge Placement              Patient chooses bed at:  ([facility]) Patient to be transferred to facility by: [name] Name of family member notified: [name] Patient and family notified of of transfer: 08/05/24  Discharge Plan and Services Additional resources added to the After Visit Summary for   In-house Referral: Clinical Social Work                                   Social Drivers of Health (SDOH) Interventions SDOH Screenings   Food Insecurity: No Food Insecurity (07/28/2024)  Housing: Low Risk  (07/28/2024)  Transportation Needs: No Transportation Needs (07/28/2024)  Utilities: Not At Risk (07/28/2024)  Alcohol Screen: Low Risk  (11/10/2021)  Depression (PHQ2-9): High Risk (04/28/2024)  Financial Resource Strain: Low Risk  (04/27/2024)  Physical Activity: Inactive (04/27/2024)  Social Connections: Unknown (07/28/2024)  Stress: Stress Concern Present (04/27/2024)  Tobacco Use: Medium Risk (08/03/2024)     Readmission Risk Interventions     No data to display

## 2024-08-05 NOTE — Progress Notes (Addendum)
-----------------------------------------------  Patient assessed by the [facility]------------------------------------   Chart reviewed:Yes   Documentation gaps: none  Labs, test, and orders reviewed: yes  30-day Readmission: No  Discharge order: Yes  Current discharge plan: Discharge to [facility]   Barrier to discharge before 11am: Awaiting English as a second language teacher    Intervention provided by [facility] team: EPIC Secure Chat with TOC, no further updates at this time   -Barrier resolved: No   Zarian Colpitts, RN [employer]

## 2024-08-05 NOTE — Discharge Summary (Signed)
 DISCHARGE SUMMARY  Shannon Sutton  MR#: [id number]  DOB:1946-09-07  Date of Admission: 07/27/2024 Date of Discharge: 08/05/2024  Attending Physician:Lummie Montijo ONEIDA Moores, MD  Patient's ERE:Shannon Sutton GRADE, MD  Disposition: D/C to ALF w/ [facility] PT/OT  Follow-up Appts:  Follow-up Information     Theophilus Delma, Sutton GRADE, MD Follow up in 1 week(s).   Specialty: Internal Medicine Contact information: [address] [place] [address] [phone]         Addie Cordella Hamilton, MD Follow up.   Specialty: Orthopedic Surgery Why: As instructed. Contact information: [address]  [place] [address] [phone]                 Tests Needing Follow-up: -Recheck of hemoglobin in outpatient follow-up -Recheck of potassium in outpatient follow-up -Assess heart rate -Assure patient is wearing compression stockings and using abdominal binder -Assure patient is following orthostatic precautions when ambulating/changing postural positions -Follow-up of vitamin D  level indicated in future  Discharge Diagnoses: Parkinson's disease with orthostatic hypotension and generalized weakness/falls Sinus tachycardia Normocytic anemia / anemia of chronic disease  Hypokalemia Left humerus fracture Scalp laceration  Right posterior 9th through 11th rib fractures RLS HLD Vitamin D  deficiency   Initial presentation: 78 year old with a history of Parkinson's disease, a recent left humeral fracture, anxiety/depression, fibromyalgia, HTN, HLD, RLS, chronic urinary incontinence, and osteoarthritis who presented to the [facility] with generalized weakness and a fall. It was felt that her falls were related to a recent increase in her dose of Sinemet . Workup was notable for orthostatic hypotension.    Hospital Course:  Parkinson's disease with orthostatic hypotension and generalized weakness/falls Orthostasis felt to be related to Parkinson's +/- recent  increase in Sinemet  - TTE noted preserved EF with no AoS - compression stockings and abdominal binder recommended- counseled on need for slow positional changes -TSH normal -cortisol not low -was initially refusing Sinemet 80 altogether but has since agreed to resume her prior dose of 25/100 five times a day - Neurology has evaluated and recommended continuing Sinemet  at this dose   Sinus tachycardia TSH normal -CTa chest without evidence of PE -possibly due to pain -heart rate stable at the time of discharge and only mildly elevated   Normocytic anemia / anemia of chronic disease  Baseline hemoglobin appears to be 12-13 - hemoglobin has been 8.6-9.0 during this admission - in setting of orthostasis patient was transfused 1 unit PRBC 10/21 - labs noted low normal iron with normal B12 and folate and low TIBC - perhaps this is nutritional (albumin low at 3.1) -hemoglobin stable for now -outpatient monitoring is indicated   Hypokalemia Supplement and follow up as an outpatient -mild   Left humerus fracture Pre-existing -was scheduled for orthopedic follow-up on day of admission -follow-up x-rays during admission noted displaced proximal humeral fracture with increased angulation and displacement from prior exam - underwent successful ORIF of 2 part proximal humerus fracture 10/22 during hospitalization - f/u w/ Dr. Addie, with care per Ortho as follows:  She should stay in the sling until her return office visit next week. We will start her in the following week on some pendulum exercises once this incision has fully healed over. We can add gabapentin for some neuropathic pain she is having around the incision.    Scalp laceration  Pre-existing - stapled previously - staples removed 10/23 -wound healing well   Right posterior 9th through 11th rib fractures Noted on CT scan - nondisplaced -pain management  RLS Continue Requip    HLD Continue rosuvastatin    Vitamin D  deficiency 25-hydroxy vitamin  D 21 this admission - supplementing  Allergies as of 08/05/2024       Reactions   Crestor  [rosuvastatin  Calcium ] Other (See Comments)   abd pain   Cymbalta  [duloxetine  Hcl] Nausea Only   Effexor  Xr [venlafaxine  Hcl Er] Other (See Comments)   dizziness   Morphine And Codeine Itching   agitation   Pravastatin  Other (See Comments)   abd pain   Promethazine-codeine Itching   Itching in face   Rizatriptan  Other (See Comments)   Tingling, prickly sensation, feelings of palpitations, flushing.         Medication List     TAKE these medications    acetaminophen  500 MG tablet Commonly known as: TYLENOL  Take 1,000 mg by mouth every 6 (six) hours as needed for mild pain (pain score 1-3) or moderate pain (pain score 4-6).   aspirin 81 MG chewable tablet Chew 1 tablet (81 mg total) by mouth 2 (two) times daily.   carbidopa -levodopa  25-100 MG tablet Commonly known as: SINEMET  IR Take 1 tablet by mouth 5 (five) times daily.   Centrum Silver tablet Take 1 tablet by mouth daily.   cholecalciferol 10 MCG (400 UNIT) Tabs tablet Commonly known as: VITAMIN D3 Take 1 tablet (400 Units total) by mouth daily. Start taking on: August 06, 2024   mirtazapine  15 MG tablet Commonly known as: REMERON  Take 15 mg by mouth at bedtime.   ondansetron  4 MG tablet Commonly known as: ZOFRAN  Take 4 mg by mouth every 8 (eight) hours as needed for nausea or vomiting.   oxyCODONE 5 MG immediate release tablet Commonly known as: Roxicodone Take 1 tablet (5 mg total) by mouth every 4 (four) hours as needed for severe pain (pain score 7-10). What changed: how much to take   polyethylene glycol powder 17 GM/SCOOP powder Commonly known as: GLYCOLAX /MIRALAX  1-3 capfulls in 8oz water daily as needed for constipation What changed:  how much to take how to take this when to take this reasons to take this additional instructions   potassium chloride  SA 20 MEQ tablet Commonly known as: KLOR-CON   M Take 1 tablet (20 mEq total) by mouth daily.   Ropinirole  HCl 6 MG Tb24 Take 6 mg by mouth at bedtime.   rosuvastatin  5 MG tablet Commonly known as: Crestor  Take 1 tablet (5 mg total) by mouth daily.   sennosides-docusate sodium  8.6-50 MG tablet Commonly known as: SENOKOT-S Take 2 tablets by mouth at bedtime.   sertraline 100 MG tablet Commonly known as: ZOLOFT Take 100 mg by mouth daily.        Day of Discharge BP (!) 147/57 (BP Location: Left Leg)   Pulse (!) 107   Temp 98.8 F (37.1 C)   Resp 16   Ht 5' 1 (1.549 m)   Wt 53.5 kg   SpO2 97%   BMI 22.30 kg/m   Physical Exam: General: No acute respiratory distress Lungs: Clear to auscultation bilaterally without wheezes or crackles Cardiovascular: Regular rate and rhythm without murmur gallop or rub normal S1 and S2 Abdomen: Nontender, nondistended, soft, bowel sounds positive, no rebound, no ascites, no appreciable mass Extremities: No significant cyanosis, clubbing, or edema bilateral lower extremities  Basic Metabolic Panel: Recent Labs  Lab 07/30/24 0608 07/31/24 0730 08/01/24 0731 08/02/24 0500 08/03/24 1829 08/05/24 0540  NA 138 137 136 135 136 139  K 3.5 4.2 3.8 3.9 3.1* 3.3*  CL  103 103 101 102  --  103  CO2 20* 19* 22 24  --  24  GLUCOSE 77 95 97 85  --  104*  BUN 5* 5* 8 8  --  5*  CREATININE 0.49 0.55 0.42* 0.50  --  0.49  CALCIUM  8.6* 8.6* 8.6* 8.5*  --  8.6*  MG 1.9 1.8 1.9 1.9  --  1.9  PHOS 3.6 3.5 3.5 3.6  --   --     CBC: Recent Labs  Lab 07/31/24 0730 08/01/24 0731 08/02/24 0459 08/02/24 1111 08/03/24 1829 08/04/24 0531 08/05/24 0540  WBC 7.4 7.6 7.1  --   --  8.7 7.8  NEUTROABS 4.8 4.3 4.2  --   --   --   --   HGB 9.2* 9.0* 8.0* 8.2* 8.2* 10.6* 9.5*  HCT 27.5* 26.4* 24.0* 24.6* 24.0* 30.8* 27.6*  MCV 97.5 96.7 98.4  --   --  94.2 94.8  PLT 270 271 281  --   --  288 316     Time spent in discharge (includes decision making & examination of pt): 35  minutes  08/05/2024, 9:00 AM   Reyes IVAR Moores, MD [facility] Office  [phone]

## 2024-08-05 NOTE — Progress Notes (Signed)
  Subjective: Patient stable.  Pain reasonably well-controlled after the block wore off yesterday.   Objective: Vital signs in last 24 hours: Temp:  [97.6 F (36.4 C)-98.2 F (36.8 C)] 97.6 F (36.4 C) (10/24 0415) Pulse Rate:  [102-113] 103 (10/24 0612) Resp:  [16-19] 16 (10/24 0415) BP: (131-148)/(45-65) 148/62 (10/24 0415) SpO2:  [95 %-98 %] 95 % (10/24 0415)  Intake/Output from previous day: 10/23 0701 - 10/24 0700 In: -  Out: 1200 [Urine:1200] Intake/Output this shift: No intake/output data recorded.  Exam:  Intact pulses distally No cellulitis present Compartment soft  Labs: Recent Labs    08/02/24 1111 08/03/24 1829 08/04/24 0531 08/05/24 0540  HGB 8.2* 8.2* 10.6* 9.5*   Recent Labs    08/04/24 0531 08/05/24 0540  WBC 8.7 7.8  RBC 3.27* 2.91*  HCT 30.8* 27.6*  PLT 288 316   Recent Labs    08/03/24 1829 08/05/24 0540  NA 136 139  K 3.1* 3.3*  CL  --  103  CO2  --  24  BUN  --  5*  CREATININE  --  0.49  GLUCOSE  --  104*  CALCIUM   --  8.6*   No results for input(s): LABPT, INR in the last 72 hours.  Assessment/Plan: Plan at this time is discharged to assisted living.  She should stay in the sling until her return office visit next week.  We will start her in the following week on some pendulum exercises once this incision has fully healed over.  We can add gabapentin for some neuropathic pain she is having around the incision.   Shannon Sutton 08/05/2024, 7:38 AM

## 2024-08-05 NOTE — Progress Notes (Signed)
 Patient escorted to [facility] pharmacy and family vehicle, [facility] called and notified of transfer.

## 2024-08-05 NOTE — Plan of Care (Signed)

## 2024-08-05 NOTE — TOC Progression Note (Signed)
 Transition of Care [facility]) - Progression Note    Patient Details  Name: Shannon Sutton MRN: [id number] Date of Birth: 1946-08-14  [facility] CM/SW Contact  Bridget Cordella Simmonds, LCSW Phone Number: 08/05/2024, 10:34 AM  Clinical Narrative:   DC summary/HH orders faxed to [facility]F/[facility].  Several attempt to call/text [facility] to confirm.    TC [name].  She spoke with Eleanor this AM regarding caregivers at [facility].  [facility] is expecting pt to return today.      Barriers to Discharge: Other (must enter comment) (Return to [facility] ALF vs SNF)               Expected Discharge Plan and Services In-house Referral: Clinical Social Work     Living arrangements for the past 2 months: Assisted Living Facility ([facility]) Expected Discharge Date: 08/05/24                                     Social Drivers of Health (SDOH) Interventions SDOH Screenings   Food Insecurity: No Food Insecurity (07/28/2024)  Housing: Low Risk  (07/28/2024)  Transportation Needs: No Transportation Needs (07/28/2024)  Utilities: Not At Risk (07/28/2024)  Alcohol Screen: Low Risk  (11/10/2021)  Depression (PHQ2-9): High Risk (04/28/2024)  Financial Resource Strain: Low Risk  (04/27/2024)  Physical Activity: Inactive (04/27/2024)  Social Connections: Unknown (07/28/2024)  Stress: Stress Concern Present (04/27/2024)  Tobacco Use: Medium Risk (08/03/2024)    Readmission Risk Interventions     No data to display

## 2024-08-05 NOTE — Progress Notes (Signed)
 PT Cancellation Note  Patient Details Name: NADEA KIRKLAND MRN: [id number] DOB: [birth year]   Cancelled Treatment:    Reason Eval/Treat Not Completed: Pain limiting ability to participate (Pt reporting 9/10 L shoulder pain. She states she is unable to particiapte in PT treatment currently. RN notified of pt's request for pain medicine. Will follow-up as schedule permits.)  Randall SAUNDERS, PT, DPT Acute Rehabilitation Services Office: [phone] Secure Chat Preferred  Delon CHRISTELLA Callander 08/05/2024, 8:30 AM

## 2024-08-05 NOTE — Progress Notes (Signed)
 Occupational Therapy Treatment Patient Details Name: Shannon Sutton MRN: [id number] DOB: [birth year] Today's Date: 08/05/2024   History of present illness Shannon Sutton is a 78 y.o. female who presented to [facility] ED 07/27/14 after fall and c/o weakness. CT head with no acute intracranial abnormality. Repeat LUE x-ray shows displaced acute transverse fracture through the proximal diaphysis of the left humerus. ORIF LUE 08/03/24 PMHx: Parkinson's disease, anxiety and depression, fibromyalgia, HTN, HLD, restless leg syndrome, urinary incontinence, and arthritis. Of note, recent hospitalization 07/25/24 d/t fall sustaining mildly displaced fracture of the proximal left humeral metaphysis.   OT comments  Pt seen this session for education of shoulder precautions and implications of precautions on functional tasks. Pt and daughter at bedside provided with post-surgical shoulder handouts. Pt and daughter at bedside verbalized understanding of information and daughter reported 24/7 assist has been arranged for safe Pt return to her home. Pt continues to experience LUE pain, primary nurse staff informed of Pt requesting pain medications. OT to continue POC to follow acutely. Continued inpatient follow up therapy, <3 hours/day remains appropriate recommendation.        If plan is discharge home, recommend the following:  A little help with walking and/or transfers;A lot of help with bathing/dressing/bathroom;Assistance with cooking/housework;Direct supervision/assist for medications management;Assist for transportation   Equipment Recommendations  None recommended by OT    Recommendations for Other Services      Precautions / Restrictions Precautions Precautions: Fall Recall of Precautions/Restrictions: Impaired Precaution/Restrictions Comments: LUE in immobilizer, orthostatic hypotension wear TED hose Required Braces or Orthoses: Sling Restrictions Weight Bearing Restrictions Per Provider  Order: Yes LUE Weight Bearing Per Provider Order: Non weight bearing Other Position/Activity Restrictions: no AROM/PROM Lshoulder, OK for AROM of elbow, wrist, digits. Start pendulums a week out 10/29 per ortho note 10/23       Mobility Bed Mobility               General bed mobility comments: Pt remained bed level during shoulder precaution education, therapist offered OOB activity and Pt respectfully declined    Transfers                         Balance                                           ADL either performed or assessed with clinical judgement   ADL Overall ADL's : Needs assistance/impaired         Upper Body Bathing: Maximal assistance   Lower Body Bathing: Maximal assistance   Upper Body Dressing : Maximal assistance   Lower Body Dressing: Maximal assistance                 General ADL Comments: Pt with decreased BUE functional use impacting independence with ADL tasks.    Extremity/Trunk Assessment Upper Extremity Assessment Upper Extremity Assessment: LUE deficits/detail LUE Deficits / Details: s/p ORIF, no AROM/PROM of shoulder at this time. Prefers elbow extension, AROM of wrist and fingers WFL LUE: Unable to fully assess due to immobilization LUE Sensation: WNL LUE Coordination: decreased fine motor            Vision   Vision Assessment?: Vision impaired- to be further tested in functional context   Perception     Praxis     Communication Communication Communication: No apparent difficulties  Cognition Arousal: Alert Behavior During Therapy: WFL for tasks assessed/performed Cognition: Cognition impaired     Awareness: Intellectual awareness intact, Online awareness impaired Memory impairment (select all impairments): Short-term memory, Working memory Attention impairment (select first level of impairment): Sustained attention Executive functioning impairment (select all impairments): Reasoning,  Problem solving OT - Cognition Comments: self-limiting tendencies                 Following commands: Intact        Cueing   Cueing Techniques: Verbal cues  Exercises Exercises: Shoulder    Shoulder Instructions Shoulder Instructions Donning/doffing shirt without moving shoulder: Maximal assistance Method for sponge bathing under operated UE: Maximal assistance Donning/doffing sling/immobilizer: Maximal assistance Correct positioning of sling/immobilizer: Maximal assistance ROM for elbow, wrist and digits of operated UE: Supervision/safety Sling wearing schedule (on at all times/off for ADL's): Supervision/safety     General Comments Pt remained bed level during shoulder precaution education, therapist offered OOB activity and Pt respectfully declined. Shoulder education and handout provided to Pt and daughter at bedside. Daughter reported 24/7 assist has been arragned for Pt return to home environment. Pt reporting pain at L shoulder, requesting replacement of hot packs. Hot packs provided and therapist educated Pt and daughter on safe modality use.    Pertinent Vitals/ Pain       Pain Assessment Pain Assessment: Faces Faces Pain Scale: Hurts even more Pain Location: LUE Pain Descriptors / Indicators: Guarding Pain Intervention(s): Monitored during session, Patient requesting pain meds-RN notified  Home Living                                          Prior Functioning/Environment              Frequency  Min 2X/week        Progress Toward Goals  OT Goals(current goals can now be found in the care plan section)  Progress towards OT goals: Progressing toward goals  Acute Rehab OT Goals Patient Stated Goal: to leave hospital OT Goal Formulation: With patient Time For Goal Achievement: 08/11/24 Potential to Achieve Goals: Good ADL Goals Pt Will Perform Upper Body Bathing: with min assist;sitting Pt Will Perform Lower Body Bathing: with  min assist;sitting/lateral leans Pt Will Perform Upper Body Dressing: with min assist;sitting Pt Will Perform Lower Body Dressing: with min assist;sitting/lateral leans Pt Will Transfer to Toilet: with supervision;ambulating;grab bars Pt/caregiver will Perform Home Exercise Program: Left upper extremity;With written HEP provided Additional ADL Goal #1: Pt will return demonstration of visual compensatory scanning strategies during ADL task with Min verbal cues.  Plan      Co-evaluation                 AM-PAC OT 6 Clicks Daily Activity     Outcome Measure   Help from another person eating meals?: A Little Help from another person taking care of personal grooming?: A Lot Help from another person toileting, which includes using toliet, bedpan, or urinal?: A Lot Help from another person bathing (including washing, rinsing, drying)?: A Lot Help from another person to put on and taking off regular upper body clothing?: A Lot Help from another person to put on and taking off regular lower body clothing?: A Lot 6 Click Score: 13    End of Session Equipment Utilized During Treatment: Other (comment) (sling)  OT Visit Diagnosis: Unsteadiness on feet (R26.81);Repeated falls (  R29.6);Muscle weakness (generalized) (M62.81);Pain Pain - Right/Left: Left Pain - part of body: Arm;Shoulder (BUE)   Activity Tolerance Patient tolerated treatment well   Patient Left in bed;with call bell/phone within reach;with family/visitor present   Nurse Communication Patient requests pain meds        Time: 8953-8942 OT Time Calculation (min): 11 min  Charges: OT General Charges $OT Visit: 1 Visit OT Treatments $Therapeutic Activity: 8-22 mins  Shannon CROME, OTR/L.  [facility] Acute Rehabilitation  Office: [phone]   Shannon Sutton 08/05/2024, 11:12 AM

## 2024-08-08 ENCOUNTER — Telehealth: Payer: Self-pay

## 2024-08-08 NOTE — Telephone Encounter (Signed)
 Patient's daughter called. States patient had surgery 08/03/2024- ORIF L Proximal Humerus Fx and needs a PO Appt. and would like to be worked in either [employer] or Friday since she works from home those 2 days.   CB [phone]

## 2024-08-09 ENCOUNTER — Ambulatory Visit: Admitting: Family Medicine

## 2024-08-09 NOTE — Telephone Encounter (Signed)
 Scheduled for Friday 10/31

## 2024-08-10 DIAGNOSIS — F411 Generalized anxiety disorder: Secondary | ICD-10-CM | POA: Diagnosis not present

## 2024-08-10 DIAGNOSIS — F338 Other recurrent depressive disorders: Secondary | ICD-10-CM | POA: Diagnosis not present

## 2024-08-12 ENCOUNTER — Ambulatory Visit: Admitting: Orthopedic Surgery

## 2024-08-12 ENCOUNTER — Other Ambulatory Visit (INDEPENDENT_AMBULATORY_CARE_PROVIDER_SITE_OTHER): Payer: Self-pay

## 2024-08-12 DIAGNOSIS — S42295A Other nondisplaced fracture of upper end of left humerus, initial encounter for closed fracture: Secondary | ICD-10-CM

## 2024-08-14 ENCOUNTER — Encounter: Payer: Self-pay | Admitting: Orthopedic Surgery

## 2024-08-14 NOTE — Progress Notes (Signed)
 Post-Op Visit Note   Patient: Shannon Sutton           Date of Birth: 09/17/1946           MRN: [id number] Visit Date: 08/12/2024 PCP: Shannon Sutton, Tully GRADE, MD   Assessment & Plan:  Chief Complaint:  Chief Complaint  Patient presents with   Other    08/03/24 ORIF left proximal humerus fracture    Visit Diagnoses:  1. Other closed nondisplaced fracture of proximal end of left humerus, initial encounter     Plan: Shannon Sutton is now a week out left proximal humerus fracture.  Patient has Parkinson's which is worsening with this proximal humerus fracture event.  The patient's family has Shannon Sutton at assisted living with 24-hour supervision.  She is only taking oxycodone daily at most for pain.  On exam her deltoid does fire and she is get pretty reasonable passive range of motion without too much pain.  Radiographs look good.  Plan at this time is continue relative immobilization with a sling.  If she is not too active up and around I think she is okay to be out of the sling when she is resting in bed or reclining in a chair.  She will need repeat radiographs in 2 weeks and we will start her in physical therapy at that time.  I do think we need to get some early healing at the fracture before putting too much stress on the construct which could facilitate nonunion.  She will follow-up 4 weeks after her next clinic appointment in 2 weeks.  Follow-Up Instructions: Return in about 2 weeks (around 08/26/2024).   Orders:  Orders Placed This Encounter  Procedures   XR Shoulder Left   No orders of the defined types were placed in this encounter.   Imaging: No results found.  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/09/2021   Ataxia 11/23/2017   Generalized headaches 08/30/2014   Restless leg syndrome 12/15/2013   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   Recurrent  Dr Ishmael 2009 had a bx/MRI: was on MTX and Prednisone   External hemorrhoids    Fibromyalgia with myofascial pain 11/16/2012   Headaches, cluster    History of CT scan of head 2022   History of EKG 2021   History of mammogram 2022   History of MRI 2022   Brain   Hypercholesterolemia 12/15/2013   Hypertension    Insomnia 11/16/2012   Myalgia and myositis, unspecified    Other disorder of muscle, ligament, and fascia    eosinophilic fascitis   Other specified disease of hair and hair follicles    Pain in joint, multiple sites    Parkinson's disease (HCC)    Restless leg syndrome 12/15/2013   Unspecified sleep apnea    Urine incontinence     Family History  Problem Relation Age of Onset   Colon  polyps Mother    Heart disease Mother    Heart attack Mother    Heart attack Father    Colon cancer Father 56   Heart disease Father    COPD Sister    Emphysema Sister    COPD Brother    Emphysema Brother    Lung cancer Brother    Breast cancer Paternal Grandmother    Heart disease Paternal Grandfather    Colon polyps Paternal Grandfather    Heart disease Other        family history   Parkinson's disease Other    Stomach cancer Neg Hx    Rectal cancer Neg Hx    Esophageal cancer Neg Hx     Past Surgical History:  Procedure Laterality Date   ABDOMINAL HYSTERECTOMY     ABDOMINOPLASTY  2006   tummy tuck   BREAST REDUCTION SURGERY  1995   BREAST SURGERY     CATARACT EXTRACTION, BILATERAL     COLONOSCOPY  2018   MUSCLE BIOPSY   2009   DEEP TISSUE   ORIF HUMERUS FRACTURE Left 08/03/2024   Procedure: OPEN REDUCTION INTERNAL FIXATION (ORIF) PROXIMAL HUMERUS FRACTURE;  Surgeon: Shannon Cordella Hamilton, MD;  Location: [facility] OR;  Service: Orthopedics;  Laterality: Left;   REFRACTIVE SURGERY     TONSILLECTOMY     TOTAL ABDOMINAL HYSTERECTOMY  1999   WISDOM TOOTH EXTRACTION     Social History   Occupational History   Occupation: Retired    Associate Professor: RETIRED    Comment: development worker, community  Tobacco Use   Smoking status: Former    Current packs/day: 0.00    Types: Cigarettes    Quit date: 12/19/1986    Years since quitting: 37.6   Smokeless tobacco: Never  Vaping Use   Vaping status: Never Used  Substance and Sexual Activity   Alcohol use: Not Currently   Drug use: No   Sexual activity: Yes    Birth control/protection: Surgical, Post-menopausal

## 2024-08-15 ENCOUNTER — Encounter: Payer: Self-pay | Admitting: Radiology

## 2024-08-17 ENCOUNTER — Encounter: Payer: Self-pay | Admitting: Internal Medicine

## 2024-08-17 ENCOUNTER — Ambulatory Visit (INDEPENDENT_AMBULATORY_CARE_PROVIDER_SITE_OTHER): Admitting: Internal Medicine

## 2024-08-17 VITALS — BP 110/70 | HR 100 | Temp 98.2°F | Wt 109.3 lb

## 2024-08-17 DIAGNOSIS — E876 Hypokalemia: Secondary | ICD-10-CM | POA: Diagnosis not present

## 2024-08-17 DIAGNOSIS — Z09 Encounter for follow-up examination after completed treatment for conditions other than malignant neoplasm: Secondary | ICD-10-CM

## 2024-08-17 DIAGNOSIS — R531 Weakness: Secondary | ICD-10-CM | POA: Diagnosis not present

## 2024-08-17 DIAGNOSIS — D649 Anemia, unspecified: Secondary | ICD-10-CM | POA: Diagnosis not present

## 2024-08-17 DIAGNOSIS — G20A2 Parkinson's disease without dyskinesia, with fluctuations: Secondary | ICD-10-CM | POA: Diagnosis not present

## 2024-08-17 LAB — CBC WITH DIFFERENTIAL/PLATELET
Basophils Absolute: 0.1 K/uL (ref 0.0–0.1)
Basophils Relative: 1.1 % (ref 0.0–3.0)
Eosinophils Absolute: 0.2 K/uL (ref 0.0–0.7)
Eosinophils Relative: 2.6 % (ref 0.0–5.0)
HCT: 34.2 % — ABNORMAL LOW (ref 36.0–46.0)
Hemoglobin: 11.7 g/dL — ABNORMAL LOW (ref 12.0–15.0)
Lymphocytes Relative: 22.6 % (ref 12.0–46.0)
Lymphs Abs: 1.6 K/uL (ref 0.7–4.0)
MCHC: 34.2 g/dL (ref 30.0–36.0)
MCV: 96.4 fl (ref 78.0–100.0)
Monocytes Absolute: 0.6 K/uL (ref 0.1–1.0)
Monocytes Relative: 8.1 % (ref 3.0–12.0)
Neutro Abs: 4.7 K/uL (ref 1.4–7.7)
Neutrophils Relative %: 65.6 % (ref 43.0–77.0)
Platelets: 387 K/uL (ref 150.0–400.0)
RBC: 3.55 Mil/uL — ABNORMAL LOW (ref 3.87–5.11)
RDW: 17.6 % — ABNORMAL HIGH (ref 11.5–15.5)
WBC: 7.2 K/uL (ref 4.0–10.5)

## 2024-08-17 LAB — BASIC METABOLIC PANEL WITH GFR
BUN: 8 mg/dL (ref 6–23)
CO2: 26 meq/L (ref 19–32)
Calcium: 8.8 mg/dL (ref 8.4–10.5)
Chloride: 102 meq/L (ref 96–112)
Creatinine, Ser: 0.55 mg/dL (ref 0.40–1.20)
GFR: 86.48 mL/min (ref >60.00)
Glucose, Bld: 95 mg/dL (ref 70–99)
Potassium: 4.1 meq/L (ref 3.5–5.1)
Sodium: 137 meq/L (ref 135–145)

## 2024-08-17 LAB — MAGNESIUM: Magnesium: 2.1 mg/dL (ref 1.5–2.5)

## 2024-08-17 NOTE — Progress Notes (Signed)
 Established Patient Office Visit     CC/Reason for Visit: Hospital follow-up  HPI: Shannon Sutton is a 78 y.o. female who is coming in today for the above mentioned reasons. Past Medical History is significant for: Parkinson's disease with frequent falls.  On day of admission she suffered a fall with a left displaced humeral fracture due to orthostatic hypotension.  Fracture was repaired surgically.  She has already followed with orthopedics.  She is in assisted living with a 24/7 sitter currently.  She has not been wearing her abdominal binder or her compression stockings.  We were asked to follow-up on her anemia and hypokalemia.  She is still taking potassium supplementation.   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   Recurrent  Dr Ishmael 2009 had a bx/MRI: was on MTX and Prednisone   External hemorrhoids    Fibromyalgia with myofascial pain 11/16/2012   Headaches, cluster    History of CT scan of head 2022   History of EKG 2021   History of mammogram 2022   History of MRI 2022   Brain   Hypercholesterolemia 12/15/2013   Hypertension    Insomnia 11/16/2012   Myalgia and myositis, unspecified    Other disorder of muscle, ligament, and fascia    eosinophilic fascitis   Other specified disease of hair and hair follicles    Pain in joint, multiple sites    Parkinson's disease (HCC)    Restless leg syndrome 12/15/2013   Unspecified sleep apnea    Urine incontinence     Past Surgical History:  Procedure Laterality Date   ABDOMINAL HYSTERECTOMY     ABDOMINOPLASTY  2006   tummy tuck   BREAST REDUCTION SURGERY  1995   BREAST SURGERY     CATARACT EXTRACTION, BILATERAL     COLONOSCOPY  2018   MUSCLE BIOPSY  2009   DEEP TISSUE    ORIF HUMERUS FRACTURE Left 08/03/2024   Procedure: OPEN REDUCTION INTERNAL FIXATION (ORIF) PROXIMAL HUMERUS FRACTURE;  Surgeon: Addie Cordella Hamilton, MD;  Location: [facility] OR;  Service: Orthopedics;  Laterality: Left;   REFRACTIVE SURGERY     TONSILLECTOMY     TOTAL ABDOMINAL HYSTERECTOMY  1999   WISDOM TOOTH EXTRACTION      Social History:  reports that she quit smoking about 37 years ago. Her smoking use included cigarettes. She has never used smokeless tobacco. She reports that she does not currently use alcohol. She reports that she does not use drugs.  Allergies: Allergies  Allergen Reactions   Cymbalta  [Duloxetine  Hcl] Nausea Only   Effexor  Xr [Venlafaxine  Hcl Er] Other (See Comments)    dizziness   Morphine And Codeine Itching    agitation   Pravastatin  Other (See Comments)    abd pain   Promethazine-Codeine Itching    Itching in face   Rizatriptan  Other (See Comments)    Tingling, prickly sensation, feelings of palpitations, flushing.     Family History:  Family History  Problem Relation Age of Onset   Colon polyps Mother    Heart disease Mother    Heart attack Mother    Heart attack Father    Colon cancer Father 63   Heart disease Father  COPD Sister    Emphysema Sister    COPD Brother    Emphysema Brother    Lung cancer Brother    Breast cancer Paternal Grandmother    Heart disease Paternal Grandfather    Colon polyps Paternal Grandfather    Heart disease Other        family history   Parkinson's disease Other    Stomach cancer Neg Hx    Rectal cancer Neg Hx    Esophageal cancer Neg Hx      Current Outpatient Medications:    acetaminophen  (TYLENOL ) 500 MG tablet, Take 1,000 mg by mouth every 6 (six) hours as needed for mild pain (pain score 1-3) or moderate pain (pain score 4-6)., Disp: , Rfl:    aspirin 81 MG chewable tablet, Chew 1 tablet (81 mg total) by mouth 2 (two) times daily., Disp: 42 tablet, Rfl: 0   carbidopa -levodopa  (SINEMET  IR) 25-100 MG  tablet, Take 1 tablet by mouth 5 (five) times daily., Disp: , Rfl:    cholecalciferol (VITAMIN D3) 10 MCG (400 UNIT) TABS tablet, Take 1 tablet (400 Units total) by mouth daily., Disp: 90 tablet, Rfl: 0   mirtazapine  (REMERON ) 15 MG tablet, Take 15 mg by mouth at bedtime., Disp: , Rfl:    Multiple Vitamins-Minerals (CENTRUM SILVER) tablet, Take 1 tablet by mouth daily., Disp: , Rfl:    ondansetron  (ZOFRAN ) 4 MG tablet, Take 4 mg by mouth every 8 (eight) hours as needed for nausea or vomiting., Disp: , Rfl:    oxyCODONE (ROXICODONE) 5 MG immediate release tablet, Take 1 tablet (5 mg total) by mouth every 4 (four) hours as needed for severe pain (pain score 7-10)., Disp: 30 tablet, Rfl: 0   polyethylene glycol powder (GLYCOLAX /MIRALAX ) 17 GM/SCOOP powder, 1-3 capfulls in 8oz water daily as needed for constipation (Patient taking differently: Take 51 g by mouth daily as needed for mild constipation or moderate constipation.), Disp: 255 g, Rfl: 0   potassium chloride  SA (KLOR-CON  M) 20 MEQ tablet, Take 1 tablet (20 mEq total) by mouth daily., Disp: 30 tablet, Rfl: 0   Ropinirole  HCl 6 MG TB24, Take 6 mg by mouth at bedtime., Disp: , Rfl:    rosuvastatin  (CRESTOR ) 5 MG tablet, Take 1 tablet (5 mg total) by mouth daily., Disp: 90 tablet, Rfl: 1   sennosides-docusate sodium  (SENOKOT-S) 8.6-50 MG tablet, Take 2 tablets by mouth at bedtime., Disp: , Rfl:    sertraline (ZOLOFT) 100 MG tablet, Take 100 mg by mouth daily., Disp: , Rfl:   Review of Systems:  Negative unless indicated in HPI.   Physical Exam: Vitals:   08/17/24 1308  BP: 110/70  Pulse: 100  Temp: 98.2 F (36.8 C)  TempSrc: Oral  SpO2: 98%  Weight: 109 lb 4.8 oz (49.6 kg)    Body mass index is 20.65 kg/m.     Impression and Plan:  Hospital discharge follow-up  Parkinson's disease with fluctuating manifestations, unspecified whether dyskinesia present (HCC)  Generalized weakness  Anemia, unspecified type -     CBC with  Differential/Platelet; Future  Hypokalemia -     Basic metabolic panel with GFR; Future -     Magnesium ; Future   [facility] charts reviewed. - Encouraged to wear compression stockings and abdominal binder. - Check CBC to follow hemoglobin, check CMP for magnesium  and potassium levels.  Time spent:31 minutes reviewing chart, interviewing and examining patient and formulating plan of care.     Tully Theophilus Andrews, MD [facility]

## 2024-08-18 ENCOUNTER — Ambulatory Visit: Payer: Self-pay | Admitting: Internal Medicine

## 2024-09-06 ENCOUNTER — Ambulatory Visit

## 2024-09-06 VITALS — Ht 61.0 in | Wt 107.0 lb

## 2024-09-06 DIAGNOSIS — Z0000 Encounter for general adult medical examination without abnormal findings: Secondary | ICD-10-CM | POA: Diagnosis not present

## 2024-09-06 NOTE — Patient Instructions (Addendum)
 Shannon Sutton,  Thank you for taking the time for your Medicare Wellness Visit. I appreciate your continued commitment to your health goals. Please review the care plan we discussed, and feel free to reach out if I can assist you further.  Please note that Annual Wellness Visits do not include a physical exam. Some assessments may be limited, especially if the visit was conducted virtually. If needed, we may recommend an in-person follow-up with your provider.  Ongoing Care Seeing your primary care provider every 3 to 6 months helps us  monitor your health and provide consistent, personalized care. Last office visit on 08/17/2024.  You are due for a tetanus vaccine and can get that done at your local pharmacy.  Each day, aim for 6 glasses of water , plenty of protein in your diet and try to get up and walk/ stretch every hour for 5-10 minutes at a time.    Referrals If a referral was made during today's visit and you haven't received any updates within two weeks, please contact the referred provider directly to check on the status.  Recommended Screenings:  Health Maintenance  Topic Date Due   DTaP/Tdap/Td vaccine (2 - Td or Tdap) 12/15/2023   COVID-19 Vaccine (7 - 2025-26 season) 07/17/2024   Medicare Annual Wellness Visit  09/06/2025   Pneumococcal Vaccine for age over 64  Completed   Flu Shot  Completed   Osteoporosis screening with Bone Density Scan  Completed   Zoster (Shingles) Vaccine  Completed   Meningitis B Vaccine  Aged Out   Colon Cancer Screening  Discontinued   Hepatitis C Screening  Discontinued       09/06/2024    9:40 AM  Advanced Directives  Does Patient Have a Medical Advance Directive? Yes  Type of Estate Agent of [place];Living will    Vision: Annual vision screenings are recommended for early detection of glaucoma, cataracts, and diabetic retinopathy. These exams can also reveal signs of chronic conditions such as diabetes and high blood  pressure.  Dental: Annual dental screenings help detect early signs of oral cancer, gum disease, and other conditions linked to overall health, including heart disease and diabetes.  Please see the attached documents for additional preventive care recommendations.

## 2024-09-06 NOTE — Progress Notes (Deleted)
 Chief Complaint  Patient presents with   Medicare Wellness     Subjective:   Shannon Sutton is a 78 y.o. female who presents for a Medicare Annual Wellness Visit.  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   Recurrent  Dr Ishmael 2009 had a bx/MRI: was on MTX and Prednisone   External hemorrhoids    Fibromyalgia with myofascial pain 11/16/2012   Headaches, cluster    History of CT scan of head 2022   History of EKG 2021   History of mammogram 2022   History of MRI 2022   Brain   Hypercholesterolemia 12/15/2013   Hypertension    Insomnia 11/16/2012   Myalgia and myositis, unspecified    Other disorder of muscle, ligament, and fascia    eosinophilic fascitis   Other specified disease of hair and hair follicles    Pain in joint, multiple sites    Parkinson's disease (HCC)    Restless leg syndrome 12/15/2013   Unspecified sleep apnea    Urine incontinence    Past Surgical History:  Procedure Laterality Date   ABDOMINAL HYSTERECTOMY     ABDOMINOPLASTY  2006   tummy tuck   BREAST REDUCTION SURGERY  1995   BREAST SURGERY     CATARACT EXTRACTION, BILATERAL     COLONOSCOPY  2018   MUSCLE BIOPSY  2009   DEEP TISSUE   ORIF HUMERUS FRACTURE Left 08/03/2024   Procedure: OPEN REDUCTION INTERNAL FIXATION (ORIF) PROXIMAL HUMERUS FRACTURE;  Surgeon: Addie Cordella Hamilton, MD;  Location: [facility] OR;  Service: Orthopedics;  Laterality: Left;   REFRACTIVE SURGERY     TONSILLECTOMY     TOTAL ABDOMINAL HYSTERECTOMY  1999   WISDOM TOOTH EXTRACTION     Family History  Problem Relation Age of Onset   Colon polyps  Mother    Heart disease Mother    Heart attack Mother    Heart attack Father    Colon cancer Father 57   Heart disease Father    COPD Sister    Emphysema Sister    COPD Brother    Emphysema Brother    Lung cancer Brother    Breast cancer Paternal Grandmother    Heart disease Paternal Grandfather    Colon polyps Paternal Grandfather    Heart disease Other        family history   Parkinson's disease Other    Stomach cancer Neg Hx    Rectal cancer Neg Hx    Esophageal cancer Neg Hx    Social History   Occupational History   Occupation: Retired    Associate Professor: RETIRED    Comment: development worker, community  Tobacco Use   Smoking status: Former    Current packs/day: 0.00    Types: Cigarettes    Quit date: 12/19/1986    Years since quitting: 37.7   Smokeless tobacco: Never  Vaping Use   Vaping status: Never Used  Substance and Sexual Activity   Alcohol use: Not Currently   Drug use: No   Sexual activity: Yes    Birth control/protection: Surgical, Post-menopausal   Tobacco Counseling Counseling given:  Not Answered  SDOH Screenings   Food Insecurity: No Food Insecurity (09/06/2024)  Housing: Unknown (09/06/2024)  Transportation Needs: No Transportation Needs (09/06/2024)  Utilities: Not At Risk (09/06/2024)  Alcohol Screen: Low Risk  (11/10/2021)  Depression (PHQ2-9): Medium Risk (09/06/2024)  Financial Resource Strain: Low Risk  (08/15/2024)  Physical Activity: Inactive (09/06/2024)  Social Connections: Socially Isolated (09/06/2024)  Stress: No Stress Concern Present (09/06/2024)  Recent Concern: Stress - Stress Concern Present (08/15/2024)  Tobacco Use: Medium Risk (09/06/2024)  Health Literacy: Inadequate Health Literacy (09/06/2024)   See flowsheets for full screening details  Depression Screen PHQ 2 & 9 Depression Scale- Over the past 2 weeks, how often have you been bothered by any of the following problems? Little interest or pleasure in doing things: 1 Feeling  down, depressed, or hopeless (PHQ Adolescent also includes...irritable): 0 PHQ-2 Total Score: 1 Trouble falling or staying asleep, or sleeping too much: 1 (falling asleep) Feeling tired or having little energy: 1 Poor appetite or overeating (PHQ Adolescent also includes...weight loss): 1 (poor appetite) Feeling bad about yourself - or that you are a failure or have let yourself or your family down: 0 Trouble concentrating on things, such as reading the newspaper or watching television (PHQ Adolescent also includes...like school work): 1 Moving or speaking so slowly that other people could have noticed. Or the opposite - being so fidgety or restless that you have been moving around a lot more than usual: 0 Thoughts that you would be better off dead, or of hurting yourself in some way: 0 PHQ-9 Total Score: 5 If you checked off any problems, how difficult have these problems made it for you to do your work, take care of things at home, or get along with other people?: Not difficult at all     Goals Addressed             This Visit's Progress    Patient Stated       Not at this time/2025       Visit info / Clinical Intake: Medicare Wellness Visit Type:: Subsequent Annual Wellness Visit Persons participating in visit:: patient Medicare Wellness Visit Mode:: Telephone If telephone:: video declined Because this visit was a virtual/telehealth visit:: pt reported vitals If Telephone or Video please confirm:: I connected with the patient using audio enabled telemedicine application and verified that I am speaking with the correct person using two identifiers; I discussed the limitations of evaluation and management by telemedicine; The patient expressed understanding and agreed to proceed Patient Location:: Home Provider Location:: Home Information given by:: patient Interpreter Needed?: No Pre-visit prep was completed: yes AWV questionnaire completed by patient prior to visit?:  no Living arrangements:: in retirement community [employer]) Patient's Overall Health Status Rating: (!) fair Typical amount of pain: none Does pain affect daily life?: no Are you currently prescribed opioids?: no  Dietary Habits and Nutritional Risks How many meals a day?: 2 Eats fruit and vegetables daily?: yes Most meals are obtained by: having others provide food ([employer] provides food/daughter provide as well) In the last 2 weeks, have you had any of the following?: none Diabetic:: no  Functional Status Activities of Daily Living (to include ambulation/medication): Independent Ambulation: Independent with device- listed below Home Assistive Devices/Equipment: Walker (specify Type); Eyeglasses (uses walker sometimes when feeling dizzy) Medication Administration: Independent Home Management: Independent Manage your own finances?: yes Primary transportation is: family/friends (daughters drive her around) Concerns about vision?: no *vision screening is required for WTM* Concerns about hearing?: no  Fall Screening Falls in the past year?: 1 Number of falls in past year: 1 Was there an injury with Fall?: 1 (lt arm surgery) Fall Risk Category Calculator: 3 Patient Fall Risk Level: High Fall Risk  Fall Risk Patient at Risk for Falls Due to: Impaired balance/gait Fall risk Follow up: Falls evaluation completed; Falls prevention discussed  Home and Transportation Safety: All rugs have non-skid backing?: yes All stairs or steps have railings?: N/A, no stairs Grab bars in the bathtub or shower?: yes Have non-skid surface in bathtub or shower?: yes Good home lighting?: yes Regular seat belt use?: yes Hospital stays in the last year:: (!) yes How many hospital stays:: 5 Reason: Left arm surgery  Cognitive Assessment Difficulty concentrating, remembering, or making decisions? : no Will 6CIT or Mini Cog be Completed: yes What year is it?: 0 points What month is it?: 0  points Give patient an address phrase to remember (5 components): [address], [place] About what time is it?: 0 points Count backwards from 20 to 1: 0 points Say the months of the year in reverse: 0 points Repeat the address phrase from earlier: 6 points 6 CIT Score: 6 points  Advance Directives (For Healthcare) Does Patient Have a Medical Advance Directive?: Yes Type of Advance Directive: Healthcare Power of [place]; Living will Would patient like information on creating a medical advance directive?: No - Patient declined  Reviewed/Updated  Reviewed/Updated: Reviewed All (Medical, Surgical, Family, Medications, Allergies, Care Teams, Patient Goals)        Objective:    Today's Vitals   09/06/24 0933  Weight: 107 lb (48.5 kg)  Height: 5' 1 (1.549 m)   Body mass index is 20.22 kg/m.  Current Medications (verified) Outpatient Encounter Medications as of 09/06/2024  Medication Sig   acetaminophen  (TYLENOL ) 500 MG tablet Take 1,000 mg by mouth every 6 (six) hours as needed for mild pain (pain score 1-3) or moderate pain (pain score 4-6).   carbidopa -levodopa  (SINEMET  IR) 25-100 MG tablet Take 1 tablet by mouth 5 (five) times daily.   mirtazapine  (REMERON ) 15 MG tablet Take 15 mg by mouth at bedtime.   Multiple Vitamins-Minerals (CENTRUM SILVER) tablet Take 1 tablet by mouth daily.   oxyCODONE  (ROXICODONE ) 5 MG immediate release tablet Take 1 tablet (5 mg total) by mouth every 4 (four) hours as needed for severe pain (pain score 7-10).   polyethylene glycol powder (GLYCOLAX /MIRALAX ) 17 GM/SCOOP powder 1-3 capfulls in 8oz water  daily as needed for constipation (Patient taking differently: Take 51 g by mouth daily as needed for mild constipation or moderate constipation.)   Ropinirole  HCl 6 MG TB24 Take 6 mg by mouth at bedtime.   rosuvastatin  (CRESTOR ) 5 MG tablet Take 1 tablet (5 mg total) by mouth daily.   sennosides-docusate sodium  (SENOKOT-S) 8.6-50 MG tablet Take 2 tablets  by mouth at bedtime.   aspirin  81 MG chewable tablet Chew 1 tablet (81 mg total) by mouth 2 (two) times daily. (Patient not taking: Reported on 09/06/2024)   cholecalciferol  (VITAMIN D3) 10 MCG (400 UNIT) TABS tablet Take 1 tablet (400 Units total) by mouth daily.   ondansetron  (ZOFRAN ) 4 MG tablet Take 4 mg by mouth every 8 (eight) hours as needed for nausea or vomiting. (Patient not taking: Reported on 09/06/2024)   potassium chloride  SA (KLOR-CON  M) 20 MEQ tablet Take 1 tablet (20 mEq total) by mouth daily. (Patient not taking: Reported on 09/06/2024)   sertraline  (ZOLOFT ) 100 MG tablet Take 100 mg  by mouth daily.   No facility-administered encounter medications on file as of 09/06/2024.   Hearing/Vision screen Vision Screening - Comments:: Wears eyeglasses/not UTD/no provider Immunizations and Health Maintenance Health Maintenance  Topic Date Due   DTaP/Tdap/Td (2 - Td or Tdap) 12/15/2023   Medicare Annual Wellness (AWV)  07/15/2024   COVID-19 Vaccine (7 - 2025-26 season) 07/17/2024   Pneumococcal Vaccine: 50+ Years  Completed   Influenza Vaccine  Completed   Bone Density Scan  Completed   Zoster Vaccines- Shingrix  Completed   Meningococcal B Vaccine  Aged Out   Colonoscopy  Discontinued   Hepatitis C Screening  Discontinued        Assessment/Plan:  This is a routine wellness examination for Glory.  Patient Care Team: Theophilus Andrews, Tully GRADE, MD as PCP - General (Internal Medicine) Lonni Slain, MD as PCP - Cardiology (Cardiology) Tat, Asberry RAMAN, DO as Consulting Physician (Neurology) Faythe Purchase, MD as Consulting Physician (Endocrinology) Legrand Victory LITTIE MOULD, MD as Consulting Physician (Gastroenterology) Maranda Leim DEL, MD as Consulting Physician (Cardiology) Gust Penna, MD as Consulting Physician (Ophthalmology)  I have personally reviewed and noted the following in the patient's chart:   Medical and social history Use of alcohol, tobacco or  illicit drugs  Current medications and supplements including opioid prescriptions. Functional ability and status Nutritional status Physical activity Advanced directives List of other physicians Hospitalizations, surgeries, and ER visits in previous 12 months Vitals Screenings to include cognitive, depression, and falls Referrals and appointments  No orders of the defined types were placed in this encounter.  In addition, I have reviewed and discussed with patient certain preventive protocols, quality metrics, and best practice recommendations. A written personalized care plan for preventive services as well as general preventive health recommendations were provided to patient.   Aldora Perman L Cherika Jessie, CMA   09/06/2024   No follow-ups on file.  After Visit Summary: (MyChart) Due to this being a telephonic visit, the after visit summary with patients personalized plan was offered to patient via MyChart   Nurse Notes: Patient is due for a Tdap.  She had no other concerns to address today.

## 2024-09-12 NOTE — Progress Notes (Signed)
 Chief Complaint  Patient presents with   Medicare Wellness     Subjective:   Shannon Sutton is a 78 y.o. female who presents for a Medicare Annual Wellness Visit.  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   Recurrent  Dr Ishmael 2009 had a bx/MRI: was on MTX and Prednisone   External hemorrhoids    Fibromyalgia with myofascial pain 11/16/2012   Headaches, cluster    History of CT scan of head 2022   History of EKG 2021   History of mammogram 2022   History of MRI 2022   Brain   Hypercholesterolemia 12/15/2013   Hypertension    Insomnia 11/16/2012   Myalgia and myositis, unspecified    Other disorder of muscle, ligament, and fascia    eosinophilic fascitis   Other specified disease of hair and hair follicles    Pain in joint, multiple sites    Parkinson's disease (HCC)    Restless leg syndrome 12/15/2013   Unspecified sleep apnea    Urine incontinence    Past Surgical History:  Procedure Laterality Date   ABDOMINAL HYSTERECTOMY     ABDOMINOPLASTY  2006   tummy tuck   BREAST REDUCTION SURGERY  1995   BREAST SURGERY     CATARACT EXTRACTION, BILATERAL     COLONOSCOPY  2018   MUSCLE BIOPSY  2009   DEEP TISSUE   ORIF HUMERUS FRACTURE Left 08/03/2024   Procedure: OPEN REDUCTION INTERNAL FIXATION (ORIF) PROXIMAL HUMERUS FRACTURE;  Surgeon: Addie Cordella Hamilton, MD;  Location: [facility] OR;  Service: Orthopedics;  Laterality: Left;   REFRACTIVE SURGERY     TONSILLECTOMY     TOTAL ABDOMINAL HYSTERECTOMY  1999   WISDOM TOOTH EXTRACTION     Family History  Problem Relation Age of Onset   Colon polyps  Mother    Heart disease Mother    Heart attack Mother    Heart attack Father    Colon cancer Father 40   Heart disease Father    COPD Sister    Emphysema Sister    COPD Brother    Emphysema Brother    Lung cancer Brother    Breast cancer Paternal Grandmother    Heart disease Paternal Grandfather    Colon polyps Paternal Grandfather    Heart disease Other        family history   Parkinson's disease Other    Stomach cancer Neg Hx    Rectal cancer Neg Hx    Esophageal cancer Neg Hx    Social History   Occupational History   Occupation: Retired    Associate Professor: RETIRED    Comment: development worker, community  Tobacco Use   Smoking status: Former    Current packs/day: 0.00    Types: Cigarettes    Quit date: 12/19/1986    Years since quitting: 37.7   Smokeless tobacco: Never  Vaping Use   Vaping status: Never Used  Substance and Sexual Activity   Alcohol use: Not Currently   Drug use: No   Sexual activity: Yes    Birth control/protection: Surgical, Post-menopausal   Tobacco Counseling Counseling given:  Not Answered  SDOH Screenings   Food Insecurity: No Food Insecurity (09/06/2024)  Housing: Unknown (09/06/2024)  Transportation Needs: No Transportation Needs (09/06/2024)  Utilities: Not At Risk (09/06/2024)  Alcohol Screen: Low Risk  (11/10/2021)  Depression (PHQ2-9): Medium Risk (09/06/2024)  Financial Resource Strain: Low Risk  (08/15/2024)  Physical Activity: Inactive (09/06/2024)  Social Connections: Socially Isolated (09/06/2024)  Stress: No Stress Concern Present (09/06/2024)  Recent Concern: Stress - Stress Concern Present (08/15/2024)  Tobacco Use: Medium Risk (09/06/2024)  Health Literacy: Inadequate Health Literacy (09/06/2024)   See flowsheets for full screening details  Depression Screen PHQ 2 & 9 Depression Scale- Over the past 2 weeks, how often have you been bothered by any of the following problems? Little interest or pleasure in doing things: 1 Feeling  down, depressed, or hopeless (PHQ Adolescent also includes...irritable): 0 PHQ-2 Total Score: 1 Trouble falling or staying asleep, or sleeping too much: 1 (falling asleep) Feeling tired or having little energy: 1 Poor appetite or overeating (PHQ Adolescent also includes...weight loss): 1 (poor appetite) Feeling bad about yourself - or that you are a failure or have let yourself or your family down: 0 Trouble concentrating on things, such as reading the newspaper or watching television (PHQ Adolescent also includes...like school work): 1 Moving or speaking so slowly that other people could have noticed. Or the opposite - being so fidgety or restless that you have been moving around a lot more than usual: 0 Thoughts that you would be better off dead, or of hurting yourself in some way: 0 PHQ-9 Total Score: 5 If you checked off any problems, how difficult have these problems made it for you to do your work, take care of things at home, or get along with other people?: Not difficult at all     Goals Addressed             This Visit's Progress    Patient Stated       Not at this time/2025       Visit info / Clinical Intake: Medicare Wellness Visit Type:: Subsequent Annual Wellness Visit Persons participating in visit:: patient Medicare Wellness Visit Mode:: Telephone If telephone:: video declined Because this visit was a virtual/telehealth visit:: unable to obtan vitals due to lack of equipment If Telephone or Video please confirm:: I connected with the patient using audio enabled telemedicine application and verified that I am speaking with the correct person using two identifiers; I discussed the limitations of evaluation and management by telemedicine; The patient expressed understanding and agreed to proceed Patient Location:: Home Provider Location:: Home Information given by:: patient Interpreter Needed?: No Pre-visit prep was completed: yes AWV questionnaire completed by  patient prior to visit?: no Living arrangements:: in retirement community [employer]) Patient's Overall Health Status Rating: (!) fair Typical amount of pain: none Does pain affect daily life?: no Are you currently prescribed opioids?: no  Dietary Habits and Nutritional Risks How many meals a day?: 2 Eats fruit and vegetables daily?: yes Most meals are obtained by: having others provide food ([employer] provides food/daughter provide as well) In the last 2 weeks, have you had any of the following?: none Diabetic:: no  Functional Status Activities of Daily Living (to include ambulation/medication): Independent Ambulation: Independent with device- listed below Home Assistive Devices/Equipment: Walker (specify Type); Eyeglasses (uses walker sometimes when feeling dizzy) Medication Administration: Independent Home Management: Independent Manage your own finances?: yes Primary transportation is: family/friends (daughters drive her around) Concerns about vision?: no *vision screening is required  for [facility]* Concerns about hearing?: no  Fall Screening Falls in the past year?: 1 Number of falls in past year: 1 Was there an injury with Fall?: 1 (lt arm surgery) Fall Risk Category Calculator: 3 Patient Fall Risk Level: High Fall Risk  Fall Risk Patient at Risk for Falls Due to: Impaired balance/gait Fall risk Follow up: Falls evaluation completed; Falls prevention discussed  Home and Transportation Safety: All rugs have non-skid backing?: yes All stairs or steps have railings?: N/A, no stairs Grab bars in the bathtub or shower?: yes Have non-skid surface in bathtub or shower?: yes Good home lighting?: yes Regular seat belt use?: yes Hospital stays in the last year:: (!) yes How many hospital stays:: 5 Reason: Left arm surgery  Cognitive Assessment Difficulty concentrating, remembering, or making decisions? : no Will 6CIT or Mini Cog be Completed: yes What year is it?: 0 points What  month is it?: 0 points Give patient an address phrase to remember (5 components): [address], [place] About what time is it?: 0 points Count backwards from 20 to 1: 0 points Say the months of the year in reverse: 0 points Repeat the address phrase from earlier: 6 points 6 CIT Score: 6 points  Advance Directives (For Healthcare) Does Patient Have a Medical Advance Directive?: Yes Type of Advance Directive: Healthcare Power of [place]; Living will Would patient like information on creating a medical advance directive?: No - Patient declined  Reviewed/Updated  Reviewed/Updated: Reviewed All (Medical, Surgical, Family, Medications, Allergies, Care Teams, Patient Goals)        Objective:    Today's Vitals   09/06/24 0933  Weight: 107 lb (48.5 kg)  Height: 5' 1 (1.549 m)   Body mass index is 20.22 kg/m.  Current Medications (verified) Outpatient Encounter Medications as of 09/06/2024  Medication Sig   acetaminophen  (TYLENOL ) 500 MG tablet Take 1,000 mg by mouth every 6 (six) hours as needed for mild pain (pain score 1-3) or moderate pain (pain score 4-6).   carbidopa -levodopa  (SINEMET  IR) 25-100 MG tablet Take 1 tablet by mouth 5 (five) times daily.   mirtazapine  (REMERON ) 15 MG tablet Take 15 mg by mouth at bedtime.   Multiple Vitamins-Minerals (CENTRUM SILVER) tablet Take 1 tablet by mouth daily.   oxyCODONE  (ROXICODONE ) 5 MG immediate release tablet Take 1 tablet (5 mg total) by mouth every 4 (four) hours as needed for severe pain (pain score 7-10).   polyethylene glycol powder (GLYCOLAX /MIRALAX ) 17 GM/SCOOP powder 1-3 capfulls in 8oz water  daily as needed for constipation (Patient taking differently: Take 51 g by mouth daily as needed for mild constipation or moderate constipation.)   Ropinirole  HCl 6 MG TB24 Take 6 mg by mouth at bedtime.   rosuvastatin  (CRESTOR ) 5 MG tablet Take 1 tablet (5 mg total) by mouth daily.   sennosides-docusate sodium  (SENOKOT-S) 8.6-50 MG tablet  Take 2 tablets by mouth at bedtime.   aspirin  81 MG chewable tablet Chew 1 tablet (81 mg total) by mouth 2 (two) times daily. (Patient not taking: Reported on 09/06/2024)   cholecalciferol  (VITAMIN D3) 10 MCG (400 UNIT) TABS tablet Take 1 tablet (400 Units total) by mouth daily.   ondansetron  (ZOFRAN ) 4 MG tablet Take 4 mg by mouth every 8 (eight) hours as needed for nausea or vomiting. (Patient not taking: Reported on 09/06/2024)   potassium chloride  SA (KLOR-CON  M) 20 MEQ tablet Take 1 tablet (20 mEq total) by mouth daily. (Patient not taking: Reported on 09/06/2024)   sertraline  (  ZOLOFT ) 100 MG tablet Take 100 mg by mouth daily.   No facility-administered encounter medications on file as of 09/06/2024.   Hearing/Vision screen Vision Screening - Comments:: Wears eyeglasses/not UTD/no provider Immunizations and Health Maintenance Health Maintenance  Topic Date Due   DTaP/Tdap/Td (2 - Td or Tdap) 12/15/2023   COVID-19 Vaccine (7 - 2025-26 season) 07/17/2024   Medicare Annual Wellness (AWV)  09/06/2025   Pneumococcal Vaccine: 50+ Years  Completed   Influenza Vaccine  Completed   Bone Density Scan  Completed   Zoster Vaccines- Shingrix  Completed   Meningococcal B Vaccine  Aged Out   Colonoscopy  Discontinued   Hepatitis C Screening  Discontinued        Assessment/Plan:  This is a routine wellness examination for Shannon Sutton.  Patient Care Team: Theophilus Andrews, Tully GRADE, MD as PCP - General (Internal Medicine) Lonni Slain, MD as PCP - Cardiology (Cardiology) Tat, Asberry RAMAN, DO as Consulting Physician (Neurology) Faythe Purchase, MD as Consulting Physician (Endocrinology) Legrand Victory LITTIE MOULD, MD as Consulting Physician (Gastroenterology) Maranda Leim DEL, MD as Consulting Physician (Cardiology) Gust Penna, MD as Consulting Physician (Ophthalmology)  I have personally reviewed and noted the following in the patient's chart:   Medical and social history Use of alcohol,  tobacco or illicit drugs  Current medications and supplements including opioid prescriptions. Functional ability and status Nutritional status Physical activity Advanced directives List of other physicians Hospitalizations, surgeries, and ER visits in previous 12 months Vitals Screenings to include cognitive, depression, and falls Referrals and appointments  No orders of the defined types were placed in this encounter.  In addition, I have reviewed and discussed with patient certain preventive protocols, quality metrics, and best practice recommendations. A written personalized care plan for preventive services as well as general preventive health recommendations were provided to patient.   Hiyab Nhem L Jonhatan Hearty, CMA  09/06/2024   Return in 1 year (on 09/06/2025).  After Visit Summary: (MyChart) Due to this being a telephonic visit, the after visit summary with patients personalized plan was offered to patient via MyChart   Nurse Notes: Patient is due for a Tdap.  She had no other concerns to address today.
